# Patient Record
Sex: Female | Born: 1950 | Race: Black or African American | Hispanic: No | Marital: Single | Smoking: Never smoker
Health system: Southern US, Community
[De-identification: ages and names within clinical notes are randomized; demographics above are authoritative.]

## PROBLEM LIST (undated history)

## (undated) DIAGNOSIS — E119 Type 2 diabetes mellitus without complications: Secondary | ICD-10-CM

## (undated) DIAGNOSIS — E785 Hyperlipidemia, unspecified: Secondary | ICD-10-CM

## (undated) DIAGNOSIS — M109 Gout, unspecified: Secondary | ICD-10-CM

## (undated) DIAGNOSIS — I10 Essential (primary) hypertension: Secondary | ICD-10-CM

## (undated) DIAGNOSIS — M1990 Unspecified osteoarthritis, unspecified site: Secondary | ICD-10-CM

## (undated) DIAGNOSIS — H33313 Horseshoe tear of retina without detachment, bilateral: Secondary | ICD-10-CM

## (undated) DIAGNOSIS — G4730 Sleep apnea, unspecified: Secondary | ICD-10-CM

## (undated) DIAGNOSIS — G5600 Carpal tunnel syndrome, unspecified upper limb: Secondary | ICD-10-CM

## (undated) DIAGNOSIS — T7840XA Allergy, unspecified, initial encounter: Secondary | ICD-10-CM

## (undated) HISTORY — DX: Hyperlipidemia, unspecified: E78.5

## (undated) HISTORY — DX: Allergy, unspecified, initial encounter: T78.40XA

## (undated) HISTORY — DX: Type 2 diabetes mellitus without complications: E11.9

## (undated) HISTORY — DX: Essential (primary) hypertension: I10

## (undated) HISTORY — DX: Horseshoe tear of retina without detachment, bilateral: H33.313

## (undated) HISTORY — DX: Unspecified osteoarthritis, unspecified site: M19.90

## (undated) HISTORY — DX: Gout, unspecified: M10.9

## (undated) HISTORY — PX: COLONOSCOPY: SHX174

## (undated) HISTORY — DX: Carpal tunnel syndrome, unspecified upper limb: G56.00

## (undated) HISTORY — PX: SHOULDER SURGERY: SHX246

## (undated) HISTORY — DX: Sleep apnea, unspecified: G47.30

---

## 1969-05-01 HISTORY — PX: TONSILECTOMY/ADENOIDECTOMY WITH MYRINGOTOMY: SHX6125

## 1989-05-01 HISTORY — PX: ABDOMINAL HYSTERECTOMY: SHX81

## 1999-05-02 HISTORY — PX: ROTATOR CUFF REPAIR: SHX139

## 2001-05-01 HISTORY — PX: ROTATOR CUFF REPAIR: SHX139

## 2004-05-01 HISTORY — PX: OTHER SURGICAL HISTORY: SHX169

## 2006-04-06 ENCOUNTER — Encounter: Admission: RE | Admit: 2006-04-06 | Discharge: 2006-04-06 | Payer: Self-pay | Admitting: Internal Medicine

## 2007-04-19 ENCOUNTER — Encounter: Admission: RE | Admit: 2007-04-19 | Discharge: 2007-04-19 | Payer: Self-pay | Admitting: Internal Medicine

## 2008-06-17 ENCOUNTER — Encounter: Admission: RE | Admit: 2008-06-17 | Discharge: 2008-06-17 | Payer: Self-pay | Admitting: Internal Medicine

## 2009-06-18 ENCOUNTER — Encounter: Admission: RE | Admit: 2009-06-18 | Discharge: 2009-06-18 | Payer: Self-pay | Admitting: Obstetrics and Gynecology

## 2009-10-12 DIAGNOSIS — G5600 Carpal tunnel syndrome, unspecified upper limb: Secondary | ICD-10-CM | POA: Insufficient documentation

## 2010-05-30 ENCOUNTER — Other Ambulatory Visit: Payer: Self-pay | Admitting: Internal Medicine

## 2010-05-30 DIAGNOSIS — Z1239 Encounter for other screening for malignant neoplasm of breast: Secondary | ICD-10-CM

## 2010-06-22 ENCOUNTER — Ambulatory Visit
Admission: RE | Admit: 2010-06-22 | Discharge: 2010-06-22 | Disposition: A | Discharge status: Home / Self Care | Payer: 59 | Source: Ambulatory Visit | Attending: Internal Medicine | Admitting: Internal Medicine

## 2010-06-22 DIAGNOSIS — Z1239 Encounter for other screening for malignant neoplasm of breast: Secondary | ICD-10-CM

## 2011-06-07 ENCOUNTER — Other Ambulatory Visit: Payer: Self-pay | Admitting: Internal Medicine

## 2011-06-07 DIAGNOSIS — Z1231 Encounter for screening mammogram for malignant neoplasm of breast: Secondary | ICD-10-CM

## 2011-06-28 ENCOUNTER — Ambulatory Visit
Admission: RE | Admit: 2011-06-28 | Discharge: 2011-06-28 | Disposition: A | Discharge status: Home / Self Care | Payer: 59 | Source: Ambulatory Visit | Attending: Internal Medicine | Admitting: Internal Medicine

## 2011-06-28 DIAGNOSIS — Z1231 Encounter for screening mammogram for malignant neoplasm of breast: Secondary | ICD-10-CM

## 2011-12-07 ENCOUNTER — Ambulatory Visit (INDEPENDENT_AMBULATORY_CARE_PROVIDER_SITE_OTHER): Payer: 59 | Admitting: Ophthalmology

## 2011-12-20 ENCOUNTER — Ambulatory Visit (INDEPENDENT_AMBULATORY_CARE_PROVIDER_SITE_OTHER): Payer: 59 | Admitting: Ophthalmology

## 2011-12-20 DIAGNOSIS — H43819 Vitreous degeneration, unspecified eye: Secondary | ICD-10-CM

## 2011-12-20 DIAGNOSIS — E1139 Type 2 diabetes mellitus with other diabetic ophthalmic complication: Secondary | ICD-10-CM

## 2011-12-20 DIAGNOSIS — E11319 Type 2 diabetes mellitus with unspecified diabetic retinopathy without macular edema: Secondary | ICD-10-CM

## 2011-12-20 DIAGNOSIS — I10 Essential (primary) hypertension: Secondary | ICD-10-CM

## 2011-12-20 DIAGNOSIS — H33309 Unspecified retinal break, unspecified eye: Secondary | ICD-10-CM

## 2011-12-20 DIAGNOSIS — E1165 Type 2 diabetes mellitus with hyperglycemia: Secondary | ICD-10-CM

## 2011-12-20 DIAGNOSIS — H35039 Hypertensive retinopathy, unspecified eye: Secondary | ICD-10-CM

## 2011-12-20 DIAGNOSIS — H2510 Age-related nuclear cataract, unspecified eye: Secondary | ICD-10-CM

## 2012-04-16 ENCOUNTER — Encounter: Payer: Self-pay | Admitting: Obstetrics and Gynecology

## 2012-04-16 ENCOUNTER — Ambulatory Visit (INDEPENDENT_AMBULATORY_CARE_PROVIDER_SITE_OTHER): Payer: 59 | Admitting: Obstetrics and Gynecology

## 2012-04-16 VITALS — BP 120/90 | HR 80 | Resp 18 | Ht 66.0 in | Wt 245.0 lb

## 2012-04-16 DIAGNOSIS — Z01419 Encounter for gynecological examination (general) (routine) without abnormal findings: Secondary | ICD-10-CM

## 2012-04-16 NOTE — Progress Notes (Addendum)
Contraception Hysterectomy Last pap 05/24/2009 WNl Last Mammo 06/2011 WNL Last Colonoscopy 09/2009 WNL Last Dexa Scan 10 years ago WNL Primary MD Dorothyann Peng Abuse at Home None  No complaints  Filed Vitals:   04/16/12 1054  BP: 120/90  Pulse: 80  Resp: 18   ROS: noncontributory  Physical Examination: General appearance - alert, well appearing, and in no distress Neck - supple, no significant adenopathy Chest - clear to auscultation, no wheezes, rales or rhonchi, symmetric air entry Heart - normal rate and regular rhythm Abdomen - soft, nontender, nondistended, no masses or organomegaly Breasts - breasts appear normal, no suspicious masses, no skin or nipple changes or axillary nodes Pelvic - normal external genitalia, vulva, vagina, adnexa (still has both ovaries), nl cuff, no palpable masses Back exam - no CVAT Extremities - no edema, redness or tenderness in the calves or thighs  A/P No further paps needed - hysterectomy for benign reasons Had mammo Up to date with colonoscopy rec BDS at 61yo

## 2012-06-26 ENCOUNTER — Other Ambulatory Visit: Payer: Self-pay

## 2012-06-26 DIAGNOSIS — Z1231 Encounter for screening mammogram for malignant neoplasm of breast: Secondary | ICD-10-CM

## 2012-07-18 ENCOUNTER — Ambulatory Visit
Admission: RE | Admit: 2012-07-18 | Discharge: 2012-07-18 | Disposition: A | Discharge status: Home / Self Care | Payer: BC Managed Care – PPO | Source: Ambulatory Visit

## 2012-07-18 DIAGNOSIS — Z1231 Encounter for screening mammogram for malignant neoplasm of breast: Secondary | ICD-10-CM

## 2012-10-05 DIAGNOSIS — M109 Gout, unspecified: Secondary | ICD-10-CM | POA: Insufficient documentation

## 2012-10-05 DIAGNOSIS — M129 Arthropathy, unspecified: Secondary | ICD-10-CM | POA: Insufficient documentation

## 2012-10-05 DIAGNOSIS — R7309 Other abnormal glucose: Secondary | ICD-10-CM | POA: Insufficient documentation

## 2012-12-19 ENCOUNTER — Ambulatory Visit (INDEPENDENT_AMBULATORY_CARE_PROVIDER_SITE_OTHER): Payer: BC Managed Care – PPO | Admitting: Ophthalmology

## 2012-12-19 DIAGNOSIS — H35039 Hypertensive retinopathy, unspecified eye: Secondary | ICD-10-CM

## 2012-12-19 DIAGNOSIS — I10 Essential (primary) hypertension: Secondary | ICD-10-CM

## 2012-12-19 DIAGNOSIS — E11319 Type 2 diabetes mellitus with unspecified diabetic retinopathy without macular edema: Secondary | ICD-10-CM

## 2012-12-19 DIAGNOSIS — H348392 Tributary (branch) retinal vein occlusion, unspecified eye, stable: Secondary | ICD-10-CM

## 2012-12-19 DIAGNOSIS — H43819 Vitreous degeneration, unspecified eye: Secondary | ICD-10-CM

## 2012-12-19 DIAGNOSIS — E1165 Type 2 diabetes mellitus with hyperglycemia: Secondary | ICD-10-CM

## 2012-12-19 DIAGNOSIS — E1139 Type 2 diabetes mellitus with other diabetic ophthalmic complication: Secondary | ICD-10-CM

## 2012-12-20 ENCOUNTER — Ambulatory Visit (INDEPENDENT_AMBULATORY_CARE_PROVIDER_SITE_OTHER): Payer: 59 | Admitting: Ophthalmology

## 2013-01-03 ENCOUNTER — Encounter (INDEPENDENT_AMBULATORY_CARE_PROVIDER_SITE_OTHER): Payer: BC Managed Care – PPO | Admitting: Ophthalmology

## 2013-01-03 DIAGNOSIS — H348392 Tributary (branch) retinal vein occlusion, unspecified eye, stable: Secondary | ICD-10-CM

## 2013-05-19 ENCOUNTER — Ambulatory Visit (INDEPENDENT_AMBULATORY_CARE_PROVIDER_SITE_OTHER): Payer: BC Managed Care – PPO | Admitting: Ophthalmology

## 2013-05-29 ENCOUNTER — Ambulatory Visit (INDEPENDENT_AMBULATORY_CARE_PROVIDER_SITE_OTHER): Payer: BC Managed Care – PPO | Admitting: Ophthalmology

## 2013-05-29 DIAGNOSIS — H35039 Hypertensive retinopathy, unspecified eye: Secondary | ICD-10-CM

## 2013-05-29 DIAGNOSIS — H43819 Vitreous degeneration, unspecified eye: Secondary | ICD-10-CM

## 2013-05-29 DIAGNOSIS — I10 Essential (primary) hypertension: Secondary | ICD-10-CM

## 2013-05-29 DIAGNOSIS — E1165 Type 2 diabetes mellitus with hyperglycemia: Secondary | ICD-10-CM

## 2013-05-29 DIAGNOSIS — E1139 Type 2 diabetes mellitus with other diabetic ophthalmic complication: Secondary | ICD-10-CM

## 2013-05-29 DIAGNOSIS — E11319 Type 2 diabetes mellitus with unspecified diabetic retinopathy without macular edema: Secondary | ICD-10-CM

## 2013-05-29 DIAGNOSIS — H348392 Tributary (branch) retinal vein occlusion, unspecified eye, stable: Secondary | ICD-10-CM

## 2013-06-12 ENCOUNTER — Other Ambulatory Visit: Payer: Self-pay

## 2013-06-12 DIAGNOSIS — Z1231 Encounter for screening mammogram for malignant neoplasm of breast: Secondary | ICD-10-CM

## 2013-07-21 ENCOUNTER — Ambulatory Visit: Payer: BC Managed Care – PPO

## 2013-08-01 ENCOUNTER — Ambulatory Visit: Payer: BC Managed Care – PPO

## 2013-08-08 ENCOUNTER — Ambulatory Visit
Admission: RE | Admit: 2013-08-08 | Discharge: 2013-08-08 | Disposition: A | Discharge status: Home / Self Care | Payer: BC Managed Care – PPO | Source: Ambulatory Visit

## 2013-08-08 DIAGNOSIS — Z1231 Encounter for screening mammogram for malignant neoplasm of breast: Secondary | ICD-10-CM

## 2014-06-10 ENCOUNTER — Ambulatory Visit (INDEPENDENT_AMBULATORY_CARE_PROVIDER_SITE_OTHER): Payer: BLUE CROSS/BLUE SHIELD | Admitting: Ophthalmology

## 2014-06-10 DIAGNOSIS — H34832 Tributary (branch) retinal vein occlusion, left eye: Secondary | ICD-10-CM

## 2014-06-10 DIAGNOSIS — E11329 Type 2 diabetes mellitus with mild nonproliferative diabetic retinopathy without macular edema: Secondary | ICD-10-CM

## 2014-06-10 DIAGNOSIS — H43813 Vitreous degeneration, bilateral: Secondary | ICD-10-CM

## 2014-06-10 DIAGNOSIS — I10 Essential (primary) hypertension: Secondary | ICD-10-CM

## 2014-06-10 DIAGNOSIS — H35033 Hypertensive retinopathy, bilateral: Secondary | ICD-10-CM

## 2014-06-10 DIAGNOSIS — E11319 Type 2 diabetes mellitus with unspecified diabetic retinopathy without macular edema: Secondary | ICD-10-CM

## 2014-07-09 ENCOUNTER — Other Ambulatory Visit: Payer: Self-pay

## 2014-07-09 DIAGNOSIS — Z1231 Encounter for screening mammogram for malignant neoplasm of breast: Secondary | ICD-10-CM

## 2014-07-15 ENCOUNTER — Other Ambulatory Visit: Payer: Self-pay | Admitting: Family Medicine

## 2014-07-15 DIAGNOSIS — M25511 Pain in right shoulder: Secondary | ICD-10-CM

## 2014-07-16 ENCOUNTER — Ambulatory Visit
Admission: RE | Admit: 2014-07-16 | Discharge: 2014-07-16 | Disposition: A | Discharge status: Home / Self Care | Payer: Self-pay | Source: Ambulatory Visit | Attending: Family Medicine | Admitting: Family Medicine

## 2014-07-16 DIAGNOSIS — M25511 Pain in right shoulder: Secondary | ICD-10-CM

## 2014-08-12 ENCOUNTER — Ambulatory Visit: Payer: Self-pay

## 2014-11-03 DIAGNOSIS — S42291D Other displaced fracture of upper end of right humerus, subsequent encounter for fracture with routine healing: Secondary | ICD-10-CM | POA: Diagnosis not present

## 2014-11-03 DIAGNOSIS — M25511 Pain in right shoulder: Secondary | ICD-10-CM | POA: Diagnosis not present

## 2014-11-03 DIAGNOSIS — M25611 Stiffness of right shoulder, not elsewhere classified: Secondary | ICD-10-CM | POA: Diagnosis not present

## 2014-11-03 DIAGNOSIS — M79601 Pain in right arm: Secondary | ICD-10-CM | POA: Diagnosis not present

## 2014-11-03 DIAGNOSIS — M6281 Muscle weakness (generalized): Secondary | ICD-10-CM | POA: Diagnosis not present

## 2014-11-11 DIAGNOSIS — M75111 Incomplete rotator cuff tear or rupture of right shoulder, not specified as traumatic: Secondary | ICD-10-CM | POA: Diagnosis not present

## 2014-11-11 DIAGNOSIS — M25611 Stiffness of right shoulder, not elsewhere classified: Secondary | ICD-10-CM | POA: Diagnosis not present

## 2014-11-18 DIAGNOSIS — M25511 Pain in right shoulder: Secondary | ICD-10-CM | POA: Diagnosis not present

## 2014-11-18 DIAGNOSIS — M25611 Stiffness of right shoulder, not elsewhere classified: Secondary | ICD-10-CM | POA: Diagnosis not present

## 2014-11-18 DIAGNOSIS — S42291D Other displaced fracture of upper end of right humerus, subsequent encounter for fracture with routine healing: Secondary | ICD-10-CM | POA: Diagnosis not present

## 2014-11-18 DIAGNOSIS — M6281 Muscle weakness (generalized): Secondary | ICD-10-CM | POA: Diagnosis not present

## 2014-11-18 DIAGNOSIS — M79601 Pain in right arm: Secondary | ICD-10-CM | POA: Diagnosis not present

## 2014-11-24 DIAGNOSIS — M79601 Pain in right arm: Secondary | ICD-10-CM | POA: Diagnosis not present

## 2014-11-24 DIAGNOSIS — M25511 Pain in right shoulder: Secondary | ICD-10-CM | POA: Diagnosis not present

## 2014-11-24 DIAGNOSIS — S42291D Other displaced fracture of upper end of right humerus, subsequent encounter for fracture with routine healing: Secondary | ICD-10-CM | POA: Diagnosis not present

## 2014-11-24 DIAGNOSIS — M25611 Stiffness of right shoulder, not elsewhere classified: Secondary | ICD-10-CM | POA: Diagnosis not present

## 2014-11-24 DIAGNOSIS — M6281 Muscle weakness (generalized): Secondary | ICD-10-CM | POA: Diagnosis not present

## 2014-11-27 DIAGNOSIS — S42291D Other displaced fracture of upper end of right humerus, subsequent encounter for fracture with routine healing: Secondary | ICD-10-CM | POA: Diagnosis not present

## 2014-11-27 DIAGNOSIS — M6281 Muscle weakness (generalized): Secondary | ICD-10-CM | POA: Diagnosis not present

## 2014-11-27 DIAGNOSIS — M79601 Pain in right arm: Secondary | ICD-10-CM | POA: Diagnosis not present

## 2014-11-27 DIAGNOSIS — M25511 Pain in right shoulder: Secondary | ICD-10-CM | POA: Diagnosis not present

## 2014-11-27 DIAGNOSIS — M25611 Stiffness of right shoulder, not elsewhere classified: Secondary | ICD-10-CM | POA: Diagnosis not present

## 2014-12-01 DIAGNOSIS — M25611 Stiffness of right shoulder, not elsewhere classified: Secondary | ICD-10-CM | POA: Diagnosis not present

## 2014-12-01 DIAGNOSIS — M25511 Pain in right shoulder: Secondary | ICD-10-CM | POA: Diagnosis not present

## 2014-12-01 DIAGNOSIS — S42291D Other displaced fracture of upper end of right humerus, subsequent encounter for fracture with routine healing: Secondary | ICD-10-CM | POA: Diagnosis not present

## 2014-12-01 DIAGNOSIS — M79601 Pain in right arm: Secondary | ICD-10-CM | POA: Diagnosis not present

## 2014-12-01 DIAGNOSIS — M6281 Muscle weakness (generalized): Secondary | ICD-10-CM | POA: Diagnosis not present

## 2014-12-04 DIAGNOSIS — M79601 Pain in right arm: Secondary | ICD-10-CM | POA: Diagnosis not present

## 2014-12-04 DIAGNOSIS — M6281 Muscle weakness (generalized): Secondary | ICD-10-CM | POA: Diagnosis not present

## 2014-12-04 DIAGNOSIS — M25511 Pain in right shoulder: Secondary | ICD-10-CM | POA: Diagnosis not present

## 2014-12-04 DIAGNOSIS — M25611 Stiffness of right shoulder, not elsewhere classified: Secondary | ICD-10-CM | POA: Diagnosis not present

## 2014-12-04 DIAGNOSIS — S42291D Other displaced fracture of upper end of right humerus, subsequent encounter for fracture with routine healing: Secondary | ICD-10-CM | POA: Diagnosis not present

## 2014-12-07 DIAGNOSIS — M6281 Muscle weakness (generalized): Secondary | ICD-10-CM | POA: Diagnosis not present

## 2014-12-07 DIAGNOSIS — M25511 Pain in right shoulder: Secondary | ICD-10-CM | POA: Diagnosis not present

## 2014-12-07 DIAGNOSIS — M79601 Pain in right arm: Secondary | ICD-10-CM | POA: Diagnosis not present

## 2014-12-07 DIAGNOSIS — S42291D Other displaced fracture of upper end of right humerus, subsequent encounter for fracture with routine healing: Secondary | ICD-10-CM | POA: Diagnosis not present

## 2014-12-07 DIAGNOSIS — M25611 Stiffness of right shoulder, not elsewhere classified: Secondary | ICD-10-CM | POA: Diagnosis not present

## 2014-12-11 DIAGNOSIS — M25611 Stiffness of right shoulder, not elsewhere classified: Secondary | ICD-10-CM | POA: Diagnosis not present

## 2014-12-11 DIAGNOSIS — M6281 Muscle weakness (generalized): Secondary | ICD-10-CM | POA: Diagnosis not present

## 2014-12-11 DIAGNOSIS — M25511 Pain in right shoulder: Secondary | ICD-10-CM | POA: Diagnosis not present

## 2014-12-11 DIAGNOSIS — S42291D Other displaced fracture of upper end of right humerus, subsequent encounter for fracture with routine healing: Secondary | ICD-10-CM | POA: Diagnosis not present

## 2014-12-11 DIAGNOSIS — M79601 Pain in right arm: Secondary | ICD-10-CM | POA: Diagnosis not present

## 2014-12-15 DIAGNOSIS — R9431 Abnormal electrocardiogram [ECG] [EKG]: Secondary | ICD-10-CM | POA: Diagnosis not present

## 2014-12-15 DIAGNOSIS — Z0000 Encounter for general adult medical examination without abnormal findings: Secondary | ICD-10-CM | POA: Diagnosis not present

## 2014-12-15 DIAGNOSIS — R7309 Other abnormal glucose: Secondary | ICD-10-CM | POA: Diagnosis not present

## 2014-12-15 DIAGNOSIS — H6121 Impacted cerumen, right ear: Secondary | ICD-10-CM | POA: Diagnosis not present

## 2014-12-15 DIAGNOSIS — G4733 Obstructive sleep apnea (adult) (pediatric): Secondary | ICD-10-CM | POA: Diagnosis not present

## 2014-12-15 DIAGNOSIS — E559 Vitamin D deficiency, unspecified: Secondary | ICD-10-CM | POA: Diagnosis not present

## 2014-12-15 DIAGNOSIS — M109 Gout, unspecified: Secondary | ICD-10-CM | POA: Diagnosis not present

## 2014-12-15 DIAGNOSIS — I10 Essential (primary) hypertension: Secondary | ICD-10-CM | POA: Diagnosis not present

## 2014-12-15 DIAGNOSIS — M779 Enthesopathy, unspecified: Secondary | ICD-10-CM | POA: Diagnosis not present

## 2014-12-21 DIAGNOSIS — M25611 Stiffness of right shoulder, not elsewhere classified: Secondary | ICD-10-CM | POA: Diagnosis not present

## 2014-12-21 DIAGNOSIS — M6281 Muscle weakness (generalized): Secondary | ICD-10-CM | POA: Diagnosis not present

## 2014-12-21 DIAGNOSIS — S42291D Other displaced fracture of upper end of right humerus, subsequent encounter for fracture with routine healing: Secondary | ICD-10-CM | POA: Diagnosis not present

## 2014-12-21 DIAGNOSIS — M79601 Pain in right arm: Secondary | ICD-10-CM | POA: Diagnosis not present

## 2014-12-21 DIAGNOSIS — M25511 Pain in right shoulder: Secondary | ICD-10-CM | POA: Diagnosis not present

## 2014-12-22 DIAGNOSIS — M19071 Primary osteoarthritis, right ankle and foot: Secondary | ICD-10-CM | POA: Diagnosis not present

## 2014-12-22 DIAGNOSIS — M1A00X0 Idiopathic chronic gout, unspecified site, without tophus (tophi): Secondary | ICD-10-CM | POA: Diagnosis not present

## 2014-12-22 DIAGNOSIS — M170 Bilateral primary osteoarthritis of knee: Secondary | ICD-10-CM | POA: Diagnosis not present

## 2014-12-22 DIAGNOSIS — M19041 Primary osteoarthritis, right hand: Secondary | ICD-10-CM | POA: Diagnosis not present

## 2014-12-23 ENCOUNTER — Ambulatory Visit
Admission: RE | Admit: 2014-12-23 | Discharge: 2014-12-23 | Disposition: A | Discharge status: Home / Self Care | Payer: BLUE CROSS/BLUE SHIELD | Source: Ambulatory Visit

## 2014-12-23 DIAGNOSIS — Z1231 Encounter for screening mammogram for malignant neoplasm of breast: Secondary | ICD-10-CM | POA: Diagnosis not present

## 2014-12-24 DIAGNOSIS — S42291D Other displaced fracture of upper end of right humerus, subsequent encounter for fracture with routine healing: Secondary | ICD-10-CM | POA: Diagnosis not present

## 2014-12-24 DIAGNOSIS — M6281 Muscle weakness (generalized): Secondary | ICD-10-CM | POA: Diagnosis not present

## 2014-12-24 DIAGNOSIS — M79601 Pain in right arm: Secondary | ICD-10-CM | POA: Diagnosis not present

## 2014-12-24 DIAGNOSIS — M25611 Stiffness of right shoulder, not elsewhere classified: Secondary | ICD-10-CM | POA: Diagnosis not present

## 2014-12-24 DIAGNOSIS — M25511 Pain in right shoulder: Secondary | ICD-10-CM | POA: Diagnosis not present

## 2014-12-29 DIAGNOSIS — M19071 Primary osteoarthritis, right ankle and foot: Secondary | ICD-10-CM | POA: Diagnosis not present

## 2014-12-29 DIAGNOSIS — M779 Enthesopathy, unspecified: Secondary | ICD-10-CM | POA: Diagnosis not present

## 2015-01-07 DIAGNOSIS — H6121 Impacted cerumen, right ear: Secondary | ICD-10-CM | POA: Diagnosis not present

## 2015-01-07 DIAGNOSIS — J069 Acute upper respiratory infection, unspecified: Secondary | ICD-10-CM | POA: Diagnosis not present

## 2015-01-14 DIAGNOSIS — R0609 Other forms of dyspnea: Secondary | ICD-10-CM | POA: Diagnosis not present

## 2015-01-14 DIAGNOSIS — I10 Essential (primary) hypertension: Secondary | ICD-10-CM | POA: Diagnosis not present

## 2015-01-14 DIAGNOSIS — R600 Localized edema: Secondary | ICD-10-CM | POA: Diagnosis not present

## 2015-01-18 DIAGNOSIS — J040 Acute laryngitis: Secondary | ICD-10-CM | POA: Diagnosis not present

## 2015-01-19 DIAGNOSIS — M779 Enthesopathy, unspecified: Secondary | ICD-10-CM | POA: Diagnosis not present

## 2015-01-19 DIAGNOSIS — M2010 Hallux valgus (acquired), unspecified foot: Secondary | ICD-10-CM | POA: Diagnosis not present

## 2015-01-19 DIAGNOSIS — M19071 Primary osteoarthritis, right ankle and foot: Secondary | ICD-10-CM | POA: Diagnosis not present

## 2015-01-22 DIAGNOSIS — M79601 Pain in right arm: Secondary | ICD-10-CM | POA: Diagnosis not present

## 2015-01-22 DIAGNOSIS — M6281 Muscle weakness (generalized): Secondary | ICD-10-CM | POA: Diagnosis not present

## 2015-01-22 DIAGNOSIS — M25511 Pain in right shoulder: Secondary | ICD-10-CM | POA: Diagnosis not present

## 2015-01-22 DIAGNOSIS — S42291D Other displaced fracture of upper end of right humerus, subsequent encounter for fracture with routine healing: Secondary | ICD-10-CM | POA: Diagnosis not present

## 2015-01-22 DIAGNOSIS — M25611 Stiffness of right shoulder, not elsewhere classified: Secondary | ICD-10-CM | POA: Diagnosis not present

## 2015-02-02 DIAGNOSIS — E8770 Fluid overload, unspecified: Secondary | ICD-10-CM | POA: Diagnosis not present

## 2015-02-02 DIAGNOSIS — Z79899 Other long term (current) drug therapy: Secondary | ICD-10-CM | POA: Diagnosis not present

## 2015-02-02 DIAGNOSIS — R7309 Other abnormal glucose: Secondary | ICD-10-CM | POA: Diagnosis not present

## 2015-02-02 DIAGNOSIS — Z23 Encounter for immunization: Secondary | ICD-10-CM | POA: Diagnosis not present

## 2015-02-02 DIAGNOSIS — Z6841 Body Mass Index (BMI) 40.0 and over, adult: Secondary | ICD-10-CM | POA: Diagnosis not present

## 2015-02-02 DIAGNOSIS — H6121 Impacted cerumen, right ear: Secondary | ICD-10-CM | POA: Diagnosis not present

## 2015-03-05 DIAGNOSIS — M1711 Unilateral primary osteoarthritis, right knee: Secondary | ICD-10-CM | POA: Diagnosis not present

## 2015-03-05 DIAGNOSIS — M1712 Unilateral primary osteoarthritis, left knee: Secondary | ICD-10-CM | POA: Diagnosis not present

## 2015-04-22 DIAGNOSIS — J040 Acute laryngitis: Secondary | ICD-10-CM | POA: Diagnosis not present

## 2015-06-03 DIAGNOSIS — M71571 Other bursitis, not elsewhere classified, right ankle and foot: Secondary | ICD-10-CM | POA: Diagnosis not present

## 2015-06-03 DIAGNOSIS — M779 Enthesopathy, unspecified: Secondary | ICD-10-CM | POA: Diagnosis not present

## 2015-06-03 DIAGNOSIS — M7741 Metatarsalgia, right foot: Secondary | ICD-10-CM | POA: Diagnosis not present

## 2015-06-23 DIAGNOSIS — L821 Other seborrheic keratosis: Secondary | ICD-10-CM | POA: Diagnosis not present

## 2015-06-23 DIAGNOSIS — J321 Chronic frontal sinusitis: Secondary | ICD-10-CM | POA: Diagnosis not present

## 2015-06-23 DIAGNOSIS — R7309 Other abnormal glucose: Secondary | ICD-10-CM | POA: Diagnosis not present

## 2015-06-23 DIAGNOSIS — M109 Gout, unspecified: Secondary | ICD-10-CM | POA: Diagnosis not present

## 2015-06-23 DIAGNOSIS — I10 Essential (primary) hypertension: Secondary | ICD-10-CM | POA: Diagnosis not present

## 2015-06-28 ENCOUNTER — Ambulatory Visit (INDEPENDENT_AMBULATORY_CARE_PROVIDER_SITE_OTHER): Payer: Medicare Other | Admitting: Ophthalmology

## 2015-06-28 DIAGNOSIS — E113393 Type 2 diabetes mellitus with moderate nonproliferative diabetic retinopathy without macular edema, bilateral: Secondary | ICD-10-CM | POA: Diagnosis not present

## 2015-06-28 DIAGNOSIS — I10 Essential (primary) hypertension: Secondary | ICD-10-CM

## 2015-06-28 DIAGNOSIS — H35033 Hypertensive retinopathy, bilateral: Secondary | ICD-10-CM | POA: Diagnosis not present

## 2015-06-28 DIAGNOSIS — E11319 Type 2 diabetes mellitus with unspecified diabetic retinopathy without macular edema: Secondary | ICD-10-CM

## 2015-06-28 DIAGNOSIS — H348322 Tributary (branch) retinal vein occlusion, left eye, stable: Secondary | ICD-10-CM

## 2015-06-28 DIAGNOSIS — H43813 Vitreous degeneration, bilateral: Secondary | ICD-10-CM | POA: Diagnosis not present

## 2015-06-28 DIAGNOSIS — H2513 Age-related nuclear cataract, bilateral: Secondary | ICD-10-CM

## 2015-07-01 DIAGNOSIS — M1A00X0 Idiopathic chronic gout, unspecified site, without tophus (tophi): Secondary | ICD-10-CM | POA: Diagnosis not present

## 2015-07-01 DIAGNOSIS — M19271 Secondary osteoarthritis, right ankle and foot: Secondary | ICD-10-CM | POA: Diagnosis not present

## 2015-07-01 DIAGNOSIS — M19241 Secondary osteoarthritis, right hand: Secondary | ICD-10-CM | POA: Diagnosis not present

## 2015-07-01 DIAGNOSIS — M170 Bilateral primary osteoarthritis of knee: Secondary | ICD-10-CM | POA: Diagnosis not present

## 2015-08-30 ENCOUNTER — Encounter (INDEPENDENT_AMBULATORY_CARE_PROVIDER_SITE_OTHER): Payer: Medicare Other | Admitting: Ophthalmology

## 2015-08-31 DIAGNOSIS — M2141 Flat foot [pes planus] (acquired), right foot: Secondary | ICD-10-CM | POA: Diagnosis not present

## 2015-08-31 DIAGNOSIS — M7741 Metatarsalgia, right foot: Secondary | ICD-10-CM | POA: Insufficient documentation

## 2015-08-31 DIAGNOSIS — M2142 Flat foot [pes planus] (acquired), left foot: Secondary | ICD-10-CM | POA: Diagnosis not present

## 2015-08-31 DIAGNOSIS — M7742 Metatarsalgia, left foot: Secondary | ICD-10-CM | POA: Diagnosis not present

## 2015-10-20 ENCOUNTER — Ambulatory Visit (INDEPENDENT_AMBULATORY_CARE_PROVIDER_SITE_OTHER): Payer: Medicare Other | Admitting: Allergy and Immunology

## 2015-10-20 ENCOUNTER — Encounter: Payer: Self-pay | Admitting: Allergy and Immunology

## 2015-10-20 ENCOUNTER — Other Ambulatory Visit: Payer: Self-pay | Admitting: Allergy

## 2015-10-20 VITALS — BP 152/90 | HR 68 | Temp 98.5°F | Resp 16 | Ht 66.34 in | Wt 252.9 lb

## 2015-10-20 DIAGNOSIS — R430 Anosmia: Secondary | ICD-10-CM | POA: Diagnosis not present

## 2015-10-20 DIAGNOSIS — R062 Wheezing: Secondary | ICD-10-CM

## 2015-10-20 DIAGNOSIS — J310 Chronic rhinitis: Secondary | ICD-10-CM | POA: Insufficient documentation

## 2015-10-20 DIAGNOSIS — J4520 Mild intermittent asthma, uncomplicated: Secondary | ICD-10-CM | POA: Insufficient documentation

## 2015-10-20 MED ORDER — ALBUTEROL SULFATE HFA 108 (90 BASE) MCG/ACT IN AERS: 2.0000 | INHALATION_SPRAY | RESPIRATORY_TRACT | Status: DC | PRN

## 2015-10-20 MED ORDER — FLUTICASONE PROPIONATE 50 MCG/ACT NA SUSP: 2.0000 | Freq: Every day | NASAL | Status: DC

## 2015-10-20 NOTE — Patient Instructions (Addendum)
Chronic rhinitis Non-allergic rhinitis.  All seasonal and perennial aeroallergen skin tests are negative despite a positive histamine control.  Intranasal steroids and intranasal antihistamines are effective for symptoms associated with non-allergic rhinitis, whereas second generation antihistamines such as cetirizine, loratadine and fexofenadine have been found to be ineffective for this condition.  A prescription has been provided for fluticasone nasal spray, 2 sprays per nostril daily. Proper nasal spray technique has been discussed and demonstrated.  I have also recommended nasal saline lavage (i.e., NeilMed) as needed prior to medicated nasal sprays.  Guaifenesin 1200 mg twice daily as needed with adequate hydration as discussed.   Anosmia  A prescription has been provided for fluticasone nasal spray (as above).  Robin Odom plans to see an otolaryngologist in the near future to rule out nasal polyps.  Intermittent coughing/wheezing  A prescription has been provided for albuterol HFA, 1-2 inhalations every 4-6 hours as needed.  Subjective and objective measures of pulmonary function will be followed and the treatment plan will be adjusted accordingly.    Return in about 4 months (around 02/19/2016), or if symptoms worsen or fail to improve.

## 2015-10-20 NOTE — Assessment & Plan Note (Addendum)
Non-allergic rhinitis.  All seasonal and perennial aeroallergen skin tests are negative despite a positive histamine control.  Intranasal steroids and intranasal antihistamines are effective for symptoms associated with non-allergic rhinitis, whereas second generation antihistamines such as cetirizine, loratadine and fexofenadine have been found to be ineffective for this condition.  A prescription has been provided for fluticasone nasal spray, 2 sprays per nostril daily. Proper nasal spray technique has been discussed and demonstrated.  I have also recommended nasal saline lavage (i.e., NeilMed) as needed prior to medicated nasal sprays.  Guaifenesin 1200 mg twice daily as needed with adequate hydration as discussed.

## 2015-10-20 NOTE — Progress Notes (Signed)
New Patient Note  RE: Robin Odom MRN: [id number] DOB: 1951-03-12 Date of Office Visit: 10/20/2015  Referring provider: Glendale Chard, MD Primary care provider: Maximino Greenland, MD  Chief Complaint: Allergies and Nasal Congestion   History of present illness: HPI Comments: Robin Odom is a 65 y.o. female presenting today for consultation of rhinitis.  She reports that starting in August 2016 she began experiencing persistent nasal congestion, "relentless" thick postnasal drainage, throat clearing, hoarseness, and sneezing "like crazy."  In addition, she reports anosmia since late February 2017.  She experiences coughing and mild wheezing with upper respiratory tract infections.  She denies lower respiratory symptoms at rest or with exercise.   Assessment and plan: Chronic rhinitis Non-allergic rhinitis.  All seasonal and perennial aeroallergen skin tests are negative despite a positive histamine control.  Intranasal steroids and intranasal antihistamines are effective for symptoms associated with non-allergic rhinitis, whereas second generation antihistamines such as cetirizine, loratadine and fexofenadine have been found to be ineffective for this condition.  A prescription has been provided for fluticasone nasal spray, 2 sprays per nostril daily. Proper nasal spray technique has been discussed and demonstrated.  I have also recommended nasal saline lavage (i.e., NeilMed) as needed prior to medicated nasal sprays.  Guaifenesin 1200 mg twice daily as needed with adequate hydration as discussed.   Anosmia  A prescription has been provided for fluticasone nasal spray (as above).  Tria plans to see an otolaryngologist in the near future to rule out nasal polyps.  Intermittent coughing/wheezing  A prescription has been provided for albuterol HFA, 1-2 inhalations every 4-6 hours as needed.  Subjective and objective measures of pulmonary function will be followed and the treatment plan  will be adjusted accordingly.    Meds ordered this encounter  Medications  . albuterol (PROAIR HFA) 108 (90 Base) MCG/ACT inhaler    Sig: Inhale 2 puffs into the lungs every 4 (four) hours as needed for wheezing or shortness of breath.    Dispense:  1 Inhaler    Refill:  1    USE WITH SPACER  . fluticasone (FLONASE) 50 MCG/ACT nasal spray    Sig: Place 2 sprays into both nostrils daily.    Dispense:  16 g    Refill:  5    FOR STUFFY NOSE OR DRAINAGE.    Diagnositics: Spirometry: FVC was 2.02 L and FEV1 was 1.72 L (80% predicted) with 170 mL postbronchodilator improvement.  Please see scanned spirometry results for details. Epicutaneous testing: Negative despite a positive histamine control. Intradermal testing: Negative despite a positive histamine control.    Physical examination: Blood pressure 152/90, pulse 68, temperature 98.5 F (36.9 C), temperature source Oral, resp. rate 16, height 5' 6.34" (1.685 m), weight 252 lb 13.9 oz (114.7 kg), SpO2 98 %.  General: Alert, interactive, in no acute distress. HEENT: TMs pearly gray, turbinates edematous with thick discharge, post-pharynx moderately erythematous.  No nasal polyps visualized. Neck: Supple without lymphadenopathy. Lungs: Clear to auscultation without wheezing, rhonchi or rales. CV: Normal S1, S2 without murmurs. Abdomen: Nondistended, nontender. Skin: Warm and dry, without lesions or rashes. Extremities:  No clubbing, cyanosis or edema. Neuro:   Grossly intact.  Review of systems:  Review of Systems  Constitutional: Negative for fever, chills and weight loss.  HENT: Positive for congestion. Negative for nosebleeds.   Eyes: Negative for blurred vision.  Respiratory: Positive for cough and wheezing. Negative for hemoptysis.   Cardiovascular: Negative for chest pain.  Gastrointestinal: Negative for diarrhea and  constipation.  Genitourinary: Negative for dysuria.  Musculoskeletal: Negative for myalgias and joint  pain.  Neurological: Negative for dizziness.  Endo/Heme/Allergies: Positive for environmental allergies. Does not bruise/bleed easily.    Past medical history:  Past Medical History  Diagnosis Date  . Hypertension   . Diabetes mellitus without complication ([place])   . Arthritis   . Gout     Past surgical history:  Past Surgical History  Procedure Laterality Date  . Abdominal hysterectomy    . Tonsilectomy/adenoidectomy with myringotomy  1971  . Shoulder surgery  2001/2003  . Left knee surgery  2006    Family history: Family History  Problem Relation Age of Onset  . Heart disease Mother   . Arthritis Father   . Hypertension Father   . Prostate cancer Father   . Sinusitis Sister   . Allergic rhinitis Sister   . Angioedema Neg Hx   . Asthma Neg Hx   . Eczema Neg Hx   . Immunodeficiency Neg Hx   . Urticaria Neg Hx     Social history: Social History   Social History  . Marital Status: Unknown    Spouse Name: N/A  . Number of Children: N/A  . Years of Education: N/A   Occupational History  . Not on file.   Social History Main Topics  . Smoking status: Never Smoker   . Smokeless tobacco: Not on file  . Alcohol Use: Yes  . Drug Use: No  . Sexual Activity: Not Currently    Birth Control/ Protection: None     Comment: Hysterectomy   Other Topics Concern  . Not on file   Social History Narrative   Environmental History: The patient lives in a 66 year old house with carpeting throughout, gas heat, and central air.  She is a nonsmoker without pets.    Medication List       This list is accurate as of: 10/20/15  6:52 PM.  Always use your most recent med list.               albuterol 108 (90 Base) MCG/ACT inhaler  Commonly known as:  PROAIR HFA  Inhale 2 puffs into the lungs every 4 (four) hours as needed for wheezing or shortness of breath.     albuterol 108 (90 Base) MCG/ACT inhaler  Commonly known as:  PROVENTIL HFA  Inhale 2 puffs into the lungs  every 4 (four) hours as needed for wheezing or shortness of breath.     allopurinol 300 MG tablet  Commonly known as:  ZYLOPRIM  Take 300 mg by mouth.     AZOR 5-40 MG tablet  Generic drug:  amLODipine-olmesartan  Take 1 tablet by mouth daily.     colchicine 0.6 MG tablet  Take 0.6 mg by mouth as needed.     fluticasone 50 MCG/ACT nasal spray  Commonly known as:  FLONASE  Place 2 sprays into both nostrils daily.     furosemide 40 MG tablet  Commonly known as:  LASIX  Take 40 mg by mouth daily.     Glucosamine-Chondroitin 1500-1200 MG/30ML Liqd  Take 2,000 mg by mouth daily. 2 tablespoons daily.     JANUVIA 100 MG tablet  Generic drug:  sitaGLIPtin  Take 100 mg by mouth daily.     KLOR-CON M10 10 MEQ tablet  Generic drug:  potassium chloride     LOVAZA 1 g capsule  Generic drug:  omega-3 acid ethyl esters  Take 2 g by mouth 2 (  two) times daily. Reported on 10/20/2015     Melatonin 3 MG Tabs  Take 3 mg by mouth as needed.     meloxicam 15 MG tablet  Commonly known as:  MOBIC  Take 15 mg by mouth as needed.     metoprolol succinate 50 MG 24 hr tablet  Commonly known as:  TOPROL-XL  Take 50 mg by mouth daily. Take with or immediately following a meal.     MULTIVITAMIN+ Liqd  Take by mouth.     potassium chloride 20 MEQ packet  Commonly known as:  KLOR-CON  Take 20 mEq by mouth 1 day or 1 dose. Reported on 10/20/2015     potassium chloride 10 MEQ tablet  Commonly known as:  K-DUR  Take 10 mEq by mouth 1 day or 1 dose.     sitaGLIPtin-metformin 50-1000 MG tablet  Commonly known as:  JANUMET  Take 1 tablet by mouth 2 (two) times daily with a meal. Reported on 10/20/2015     sitaGLIPtin-metformin 50-500 MG tablet  Commonly known as:  JANUMET  Take 1 tablet by mouth 2 (two) times daily with a meal. Reported on 10/20/2015     Vitamin D3 3000 units Tabs  Take by mouth.     Vitamin-B Complex Tabs  Take by mouth.        Known medication allergies: Allergies    Allergen Reactions  . Codeine Other (See Comments)    Hallucinations  . Penicillins Hives    I appreciate the opportunity to take part in Laryah's care. Please do not hesitate to contact me with questions.  Sincerely,   R. Edgar Frisk, MD

## 2015-10-20 NOTE — Assessment & Plan Note (Signed)
   A prescription has been provided for albuterol HFA, 1-2 inhalations every 4-6 hours as needed.  Subjective and objective measures of pulmonary function will be followed and the treatment plan will be adjusted accordingly. 

## 2015-10-20 NOTE — Assessment & Plan Note (Signed)
   A prescription has been provided for fluticasone nasal spray (as above).  Robin Odom plans to see an otolaryngologist in the near future to rule out nasal polyps.

## 2015-10-27 DIAGNOSIS — Z79899 Other long term (current) drug therapy: Secondary | ICD-10-CM | POA: Diagnosis not present

## 2015-10-27 DIAGNOSIS — R7309 Other abnormal glucose: Secondary | ICD-10-CM | POA: Diagnosis not present

## 2015-10-27 DIAGNOSIS — I10 Essential (primary) hypertension: Secondary | ICD-10-CM | POA: Diagnosis not present

## 2015-10-27 DIAGNOSIS — J040 Acute laryngitis: Secondary | ICD-10-CM | POA: Diagnosis not present

## 2015-11-16 ENCOUNTER — Other Ambulatory Visit: Payer: Self-pay | Admitting: Internal Medicine

## 2015-11-16 DIAGNOSIS — Z1231 Encounter for screening mammogram for malignant neoplasm of breast: Secondary | ICD-10-CM

## 2015-12-08 DIAGNOSIS — J370 Chronic laryngitis: Secondary | ICD-10-CM | POA: Diagnosis not present

## 2015-12-08 DIAGNOSIS — K219 Gastro-esophageal reflux disease without esophagitis: Secondary | ICD-10-CM | POA: Insufficient documentation

## 2015-12-08 DIAGNOSIS — R430 Anosmia: Secondary | ICD-10-CM | POA: Diagnosis not present

## 2015-12-24 ENCOUNTER — Ambulatory Visit
Admission: RE | Admit: 2015-12-24 | Discharge: 2015-12-24 | Disposition: A | Discharge status: Home / Self Care | Payer: Medicare Other | Source: Ambulatory Visit | Attending: Internal Medicine | Admitting: Internal Medicine

## 2015-12-24 DIAGNOSIS — Z1231 Encounter for screening mammogram for malignant neoplasm of breast: Secondary | ICD-10-CM | POA: Diagnosis not present

## 2015-12-24 DIAGNOSIS — Z79899 Other long term (current) drug therapy: Secondary | ICD-10-CM | POA: Diagnosis not present

## 2015-12-24 DIAGNOSIS — R5381 Other malaise: Secondary | ICD-10-CM | POA: Diagnosis not present

## 2015-12-30 DIAGNOSIS — M25561 Pain in right knee: Secondary | ICD-10-CM | POA: Diagnosis not present

## 2015-12-30 DIAGNOSIS — M1711 Unilateral primary osteoarthritis, right knee: Secondary | ICD-10-CM | POA: Diagnosis not present

## 2016-01-11 DIAGNOSIS — E559 Vitamin D deficiency, unspecified: Secondary | ICD-10-CM | POA: Diagnosis not present

## 2016-01-11 DIAGNOSIS — M2550 Pain in unspecified joint: Secondary | ICD-10-CM | POA: Diagnosis not present

## 2016-01-11 DIAGNOSIS — Z0001 Encounter for general adult medical examination with abnormal findings: Secondary | ICD-10-CM | POA: Diagnosis not present

## 2016-01-11 DIAGNOSIS — R7309 Other abnormal glucose: Secondary | ICD-10-CM | POA: Diagnosis not present

## 2016-01-11 DIAGNOSIS — I10 Essential (primary) hypertension: Secondary | ICD-10-CM | POA: Diagnosis not present

## 2016-01-17 DIAGNOSIS — M19241 Secondary osteoarthritis, right hand: Secondary | ICD-10-CM | POA: Diagnosis not present

## 2016-01-17 DIAGNOSIS — M174 Other bilateral secondary osteoarthritis of knee: Secondary | ICD-10-CM | POA: Diagnosis not present

## 2016-01-17 DIAGNOSIS — M5030 Other cervical disc degeneration, unspecified cervical region: Secondary | ICD-10-CM | POA: Diagnosis not present

## 2016-01-17 DIAGNOSIS — M19271 Secondary osteoarthritis, right ankle and foot: Secondary | ICD-10-CM | POA: Diagnosis not present

## 2016-02-12 DIAGNOSIS — Z23 Encounter for immunization: Secondary | ICD-10-CM | POA: Diagnosis not present

## 2016-04-07 DIAGNOSIS — Z8739 Personal history of other diseases of the musculoskeletal system and connective tissue: Secondary | ICD-10-CM | POA: Diagnosis not present

## 2016-04-07 DIAGNOSIS — Z01419 Encounter for gynecological examination (general) (routine) without abnormal findings: Secondary | ICD-10-CM | POA: Diagnosis not present

## 2016-04-07 DIAGNOSIS — Z6841 Body Mass Index (BMI) 40.0 and over, adult: Secondary | ICD-10-CM | POA: Diagnosis not present

## 2016-04-19 DIAGNOSIS — Z6841 Body Mass Index (BMI) 40.0 and over, adult: Secondary | ICD-10-CM | POA: Diagnosis not present

## 2016-04-19 DIAGNOSIS — R7303 Prediabetes: Secondary | ICD-10-CM | POA: Diagnosis not present

## 2016-04-19 DIAGNOSIS — M2550 Pain in unspecified joint: Secondary | ICD-10-CM | POA: Diagnosis not present

## 2016-06-30 ENCOUNTER — Ambulatory Visit (INDEPENDENT_AMBULATORY_CARE_PROVIDER_SITE_OTHER): Payer: Medicare Other | Admitting: Ophthalmology

## 2016-06-30 DIAGNOSIS — E11311 Type 2 diabetes mellitus with unspecified diabetic retinopathy with macular edema: Secondary | ICD-10-CM

## 2016-06-30 DIAGNOSIS — H43813 Vitreous degeneration, bilateral: Secondary | ICD-10-CM

## 2016-06-30 DIAGNOSIS — H35033 Hypertensive retinopathy, bilateral: Secondary | ICD-10-CM | POA: Diagnosis not present

## 2016-06-30 DIAGNOSIS — I10 Essential (primary) hypertension: Secondary | ICD-10-CM

## 2016-06-30 DIAGNOSIS — E113312 Type 2 diabetes mellitus with moderate nonproliferative diabetic retinopathy with macular edema, left eye: Secondary | ICD-10-CM | POA: Diagnosis not present

## 2016-07-07 ENCOUNTER — Other Ambulatory Visit: Payer: Self-pay | Admitting: Rheumatology

## 2016-07-20 DIAGNOSIS — M19041 Primary osteoarthritis, right hand: Secondary | ICD-10-CM | POA: Insufficient documentation

## 2016-07-20 DIAGNOSIS — M19042 Primary osteoarthritis, left hand: Secondary | ICD-10-CM | POA: Insufficient documentation

## 2016-07-20 DIAGNOSIS — Z8781 Personal history of (healed) traumatic fracture: Secondary | ICD-10-CM | POA: Insufficient documentation

## 2016-07-20 DIAGNOSIS — M1A09X0 Idiopathic chronic gout, multiple sites, without tophus (tophi): Secondary | ICD-10-CM | POA: Insufficient documentation

## 2016-07-20 DIAGNOSIS — Z8639 Personal history of other endocrine, nutritional and metabolic disease: Secondary | ICD-10-CM | POA: Insufficient documentation

## 2016-07-20 DIAGNOSIS — M19071 Primary osteoarthritis, right ankle and foot: Secondary | ICD-10-CM | POA: Insufficient documentation

## 2016-07-20 DIAGNOSIS — Z8679 Personal history of other diseases of the circulatory system: Secondary | ICD-10-CM | POA: Insufficient documentation

## 2016-07-20 DIAGNOSIS — R748 Abnormal levels of other serum enzymes: Secondary | ICD-10-CM | POA: Insufficient documentation

## 2016-07-20 DIAGNOSIS — M170 Bilateral primary osteoarthritis of knee: Secondary | ICD-10-CM | POA: Insufficient documentation

## 2016-07-20 DIAGNOSIS — M47812 Spondylosis without myelopathy or radiculopathy, cervical region: Secondary | ICD-10-CM | POA: Insufficient documentation

## 2016-07-20 DIAGNOSIS — M19072 Primary osteoarthritis, left ankle and foot: Secondary | ICD-10-CM | POA: Insufficient documentation

## 2016-07-20 NOTE — Progress Notes (Deleted)
Office Visit Note  Patient: Robin Odom             Date of Birth: Aug 18, 1950           MRN: [id number]             PCP: Maximino Greenland, MD Referring: Glendale Chard, MD Visit Date: 07/26/2016 Occupation: @GUAROCC @    Subjective:  No chief complaint on file.   History of Present Illness: Robin Odom is a 66 y.o. female ***   Activities of Daily Living:  Patient reports morning stiffness for *** {minute/hour:19697}.   Patient {ACTIONS;DENIES/REPORTS:21021675::"Denies"} nocturnal pain.  Difficulty dressing/grooming: {ACTIONS;DENIES/REPORTS:21021675::"Denies"} Difficulty climbing stairs: {ACTIONS;DENIES/REPORTS:21021675::"Denies"} Difficulty getting out of chair: {ACTIONS;DENIES/REPORTS:21021675::"Denies"} Difficulty using hands for taps, buttons, cutlery, and/or writing: {ACTIONS;DENIES/REPORTS:21021675::"Denies"}   No Rheumatology ROS completed.   PMFS History:  Patient Active Problem List   Diagnosis Date Noted  . 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 tophus 07/20/2016  . Primary osteoarthritis of both hands 07/20/2016  . Primary osteoarthritis of both knees 07/20/2016  . Primary osteoarthritis of both feet 07/20/2016  . DJD (degenerative joint disease), cervical 07/20/2016  . Elevated CK 07/20/2016  . History of humerus fracture 07/20/2016  . History of hypertension 07/20/2016  . History of diabetes mellitus 07/20/2016  . History of hyperlipidemia 07/20/2016  . Chronic rhinitis 10/20/2015  . Anosmia 10/20/2015  . Intermittent coughing/wheezing 10/20/2015    Past Medical History:  Diagnosis Date  . Arthritis   . Diabetes mellitus without complication ([place])   . Gout   . Hypertension     Family History  Problem Relation Age of Onset  . Heart disease Mother   . Arthritis Father   . Hypertension Father   . Prostate cancer Father   . Sinusitis Sister   . Allergic rhinitis Sister   . Angioedema Neg Hx   . Asthma Neg Hx   . Eczema Neg Hx   .  Immunodeficiency Neg Hx   . Urticaria Neg Hx    Past Surgical History:  Procedure Laterality Date  . ABDOMINAL HYSTERECTOMY    . Left knee surgery  2006  . SHOULDER SURGERY  2001/2003  . TONSILECTOMY/ADENOIDECTOMY WITH MYRINGOTOMY  1971   Social History   Social History Narrative  . No narrative on file     Objective: Vital Signs: There were no vitals taken for this visit.   Physical Exam   Musculoskeletal Exam: ***  CDAI Exam: No CDAI exam completed.    Investigation: Findings:  December 24, 2015:  CBC was normal.  Comprehensive metabolic panel was normal and uric acid was 5.6.     Imaging: No results found.  Speciality Comments: No specialty comments available.    Procedures:  No procedures performed Allergies: Codeine and Penicillins   Assessment / Plan:     Visit Diagnoses: 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 tophus  Primary osteoarthritis of both hands  Primary osteoarthritis of both knees  Primary osteoarthritis of both feet  DJD (degenerative joint disease), cervical  Elevated CK  History of humerus fracture - right 2016  History of hypertension  History of diabetes mellitus  History of hyperlipidemia    Orders: No orders of the defined types were placed in this encounter.  No orders of the defined types were placed in this encounter.   Face-to-face time spent with patient was *** minutes. 50% of time was spent in counseling and coordination of care.  Follow-Up Instructions: No Follow-up on file.   Robin Odom,  RT  Note - This record has been created using [employer].  Chart creation errors have been sought, but may not always  have been located. Such creation errors do not reflect on  the standard of medical care.

## 2016-07-26 ENCOUNTER — Ambulatory Visit: Payer: Medicare Other | Admitting: Rheumatology

## 2016-08-08 NOTE — Progress Notes (Signed)
Office Visit Note  Patient: Robin Odom             Date of Birth: 01-07-1951           MRN: [id number]             PCP: Maximino Greenland, MD Referring: Glendale Chard, MD Visit Date: 08/21/2016 Occupation: @GUAROCC @    Subjective:  Left knee pain  History of Present Illness: Robin Odom is a 66 y.o. female with a history of gout and osteoarthritis who presents to the clinic for a follow-up visit.  Patient states she is currently having left knee pain, which she attributes to her OA and weather changes.  Patient denies any joint swelling or overlying skin changes at this time.  Patient states she takes omega 3 and tumeric daily, which have significantly improved her pain.  Patient denies any hand, feet, ankle, or elbow pain.  She states she has recently started using cabbage wraps around her knees, which she states have provided pain relief within 30 minutes of using.      Patient states she has not had a gout flare in 2-3 years.  She states she avoids her triggers.  She states she takes her allopurinol 300 mg daily and has colchicine 0.6 mg in case of flares.  Patient is requesting a refill of her colchcine.    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  Patient states she occasionally has hand numbness after overuse.  She states previously has taken Vitamin B6, which had improved her symptoms.  She states she is going to go back on B6, which prevents and relieves her numbness.        Activities of Daily Living:  Patient reports morning stiffness 1  minute.  Stiffness worse at night. Patient Denies nocturnal pain.  Difficulty dressing/grooming: Denies Difficulty climbing stairs: Reports Difficulty getting out of chair: Reports Difficulty using hands for taps, buttons, cutlery, and/or writing: Denies   Review of Systems  Constitutional: Negative for fatigue.  HENT: Negative for mouth sores and mouth dryness.   Eyes: Negative for dryness.  Respiratory:  Negative for shortness of breath and difficulty breathing.   Cardiovascular: Negative for chest pain and palpitations.  Gastrointestinal: Negative for constipation, diarrhea, nausea and vomiting.  Musculoskeletal: Positive for arthralgias and joint pain. Negative for joint swelling and morning stiffness.  Skin: Negative for color change and rash.  Psychiatric/Behavioral: Negative for sleep disturbance.    PMFS History:  Patient Active Problem List   Diagnosis Date Noted  . 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 tophus 07/20/2016  . Primary osteoarthritis of both hands 07/20/2016  . Primary osteoarthritis of both knees 07/20/2016  . Primary osteoarthritis of both feet 07/20/2016  . DJD (degenerative joint disease), cervical 07/20/2016  . Elevated CK 07/20/2016  . History of humerus fracture 07/20/2016  . History of hypertension 07/20/2016  . History of diabetes mellitus 07/20/2016  . History of hyperlipidemia 07/20/2016  . Chronic rhinitis 10/20/2015  . Anosmia 10/20/2015  . Intermittent coughing/wheezing 10/20/2015    Past Medical History:  Diagnosis Date  . Arthritis   . Diabetes mellitus without complication ([place])   . Gout   . Hypertension     Family History  Problem Relation Age of Onset  . Heart disease Mother   . Arthritis Father   . Hypertension Father   . Prostate cancer Father   . Parkinson's disease Father   . Sinusitis Sister   .  Allergic rhinitis Sister   . Angioedema Neg Hx   . Asthma Neg Hx   . Eczema Neg Hx   . Immunodeficiency Neg Hx   . Urticaria Neg Hx    Past Surgical History:  Procedure Laterality Date  . ABDOMINAL HYSTERECTOMY    . Left knee surgery  2006  . SHOULDER SURGERY  2001/2003  . TONSILECTOMY/ADENOIDECTOMY WITH MYRINGOTOMY  1971   Social History   Social History Narrative  . No narrative on file     Objective: Vital Signs: BP (!) 163/81 (BP Location: Left Arm, Patient Position: Sitting, Cuff Size: Normal)   Pulse  72   Resp 17   Ht 5' 6.5" (1.689 m)   Wt 251 lb (113.9 kg)   BMI 39.91 kg/m    Physical Exam  Constitutional: She is oriented to person, place, and time. She appears well-developed and well-nourished.  HENT:  Head: Normocephalic and atraumatic.  Eyes: Pupils are equal, round, and reactive to light.  Cardiovascular: Normal rate and regular rhythm.   Pulmonary/Chest: Effort normal and breath sounds normal.  Abdominal: Soft.  Neurological: She is alert and oriented to person, place, and time.  Skin: Skin is warm and dry.  Psychiatric: She has a normal mood and affect.  Nursing note and vitals reviewed.    Musculoskeletal Exam: C-spine and thoracic spine exhibit full ROM.  Slightly limited flexion of right shoulder. Full ROM of elbows, wrists, MCPs, PIPs, DIPs, hips, knees, and ankles. She has mild thickening of PIP joints. No warmth swelling or effusion in the knee joints was noted.  No signs of active synovitis.  No tenderness to palpation on exam.        CDAI Exam:  No CDAI exam completed.    Investigation: Findings:  December 24, 2015:  CBC was normal.  Comprehensive metabolic panel was normal and uric acid was 5.6.     Imaging: No results found.  Speciality Comments: No specialty comments available.    Procedures:  No procedures performed Allergies: Codeine and Penicillins   Assessment / Plan:     Visit Diagnoses: 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 tophus: She has not had a flare in a while. She continues do is to do well on allopurinol. She would like to have a prescription for colchicine to be available on hold. I'll refill that today.  DJD (degenerative joint disease), cervical: Minimal discomfort  Primary osteoarthritis of both hands: She has mild osteoarthritic changes  Primary osteoarthritis of both knees: She continues to have chronic pain in her knee joints due to underlying osteoarthritis she is tolerating well.  Primary osteoarthritis of  both feet: Proper fitting shoes were discussed.  History of humerus fracture  History of hypertension: Blood pressure is elevated today. I've advised to monitor blood pressure closely and follow up with her PCP.  History of hyperlipidemia  History of diabetes mellitus    Orders: Orders Placed This Encounter  Procedures  . CBC with Differential/Platelet  . COMPLETE METABOLIC PANEL WITH GFR  . Uric acid   Meds ordered this encounter  Medications  . colchicine 0.6 MG tablet    Sig: Take 1 tablet (0.6 mg total) by mouth as needed.    Dispense:  30 tablet    Refill:  0    Face-to-face time spent with patient was 30 minutes. 50% of time was spent in counseling and coordination of care.  Follow-Up Instructions: Return in about 6 months (around 02/20/2017) for Osteoarthritis, Gout.   Abel Presto  Estanislado Pandy, MD  Note - This record has been created using [employer].  Chart creation errors have been sought, but may not always  have been located. Such creation errors do not reflect on  the standard of medical care.

## 2016-08-21 ENCOUNTER — Encounter: Payer: Self-pay | Admitting: Rheumatology

## 2016-08-21 ENCOUNTER — Ambulatory Visit (INDEPENDENT_AMBULATORY_CARE_PROVIDER_SITE_OTHER): Payer: Medicare Other | Admitting: Rheumatology

## 2016-08-21 VITALS — BP 163/81 | HR 72 | Resp 17 | Ht 66.5 in | Wt 251.0 lb

## 2016-08-21 DIAGNOSIS — M170 Bilateral primary osteoarthritis of knee: Secondary | ICD-10-CM | POA: Diagnosis not present

## 2016-08-21 DIAGNOSIS — Z5181 Encounter for therapeutic drug level monitoring: Secondary | ICD-10-CM | POA: Diagnosis not present

## 2016-08-21 DIAGNOSIS — M19071 Primary osteoarthritis, right ankle and foot: Secondary | ICD-10-CM

## 2016-08-21 DIAGNOSIS — Z8781 Personal history of (healed) traumatic fracture: Secondary | ICD-10-CM

## 2016-08-21 DIAGNOSIS — M19042 Primary osteoarthritis, left hand: Secondary | ICD-10-CM | POA: Diagnosis not present

## 2016-08-21 DIAGNOSIS — M1A09X0 Idiopathic chronic gout, multiple sites, without tophus (tophi): Secondary | ICD-10-CM | POA: Diagnosis not present

## 2016-08-21 DIAGNOSIS — M5030 Other cervical disc degeneration, unspecified cervical region: Secondary | ICD-10-CM | POA: Diagnosis not present

## 2016-08-21 DIAGNOSIS — Z8679 Personal history of other diseases of the circulatory system: Secondary | ICD-10-CM | POA: Diagnosis not present

## 2016-08-21 DIAGNOSIS — R748 Abnormal levels of other serum enzymes: Secondary | ICD-10-CM

## 2016-08-21 DIAGNOSIS — Z8639 Personal history of other endocrine, nutritional and metabolic disease: Secondary | ICD-10-CM

## 2016-08-21 DIAGNOSIS — M19072 Primary osteoarthritis, left ankle and foot: Secondary | ICD-10-CM

## 2016-08-21 DIAGNOSIS — M47812 Spondylosis without myelopathy or radiculopathy, cervical region: Secondary | ICD-10-CM

## 2016-08-21 DIAGNOSIS — M19041 Primary osteoarthritis, right hand: Secondary | ICD-10-CM

## 2016-08-21 LAB — CBC WITH DIFFERENTIAL/PLATELET
Basophils Absolute: 0 cells/uL (ref 0–200)
Basophils Relative: 0 %
Eosinophils Absolute: 218 cells/uL (ref 15–500)
Eosinophils Relative: 2 %
HCT: 40.5 % (ref 35.0–45.0)
Hemoglobin: 12.8 g/dL (ref 11.7–15.5)
Lymphocytes Relative: 28 %
Lymphs Abs: 3052 cells/uL (ref 850–3900)
MCH: 25.4 pg — ABNORMAL LOW (ref 27.0–33.0)
MCHC: 31.6 g/dL — ABNORMAL LOW (ref 32.0–36.0)
MCV: 80.4 fL (ref 80.0–100.0)
MPV: 9.9 fL (ref 7.5–12.5)
Monocytes Absolute: 654 cells/uL (ref 200–950)
Monocytes Relative: 6 %
Neutro Abs: 6976 cells/uL (ref 1500–7800)
Neutrophils Relative %: 64 %
Platelets: 343 10*3/uL (ref 140–400)
RBC: 5.04 MIL/uL (ref 3.80–5.10)
RDW: 16.9 % — ABNORMAL HIGH (ref 11.0–15.0)
WBC: 10.9 10*3/uL — ABNORMAL HIGH (ref 3.8–10.8)

## 2016-08-21 LAB — COMPLETE METABOLIC PANEL WITH GFR
ALT: 17 U/L (ref 6–29)
AST: 23 U/L (ref 10–35)
Albumin: 4.1 g/dL (ref 3.6–5.1)
Alkaline Phosphatase: 59 U/L (ref 33–130)
BUN: 15 mg/dL (ref 7–25)
CO2: 27 mmol/L (ref 20–31)
Calcium: 9.7 mg/dL (ref 8.6–10.4)
Chloride: 107 mmol/L (ref 98–110)
Creat: 0.82 mg/dL (ref 0.50–0.99)
GFR, Est African American: 86 mL/min (ref >=60)
GFR, Est Non African American: 75 mL/min (ref >=60)
Glucose, Bld: 98 mg/dL (ref 65–99)
Potassium: 4.2 mmol/L (ref 3.5–5.3)
Sodium: 143 mmol/L (ref 135–146)
Total Bilirubin: 0.4 mg/dL (ref 0.2–1.2)
Total Protein: 7.1 g/dL (ref 6.1–8.1)

## 2016-08-21 MED ORDER — COLCHICINE 0.6 MG PO TABS: 0.6000 mg | ORAL_TABLET | ORAL | 0 refills | Status: DC | PRN

## 2016-08-22 LAB — URIC ACID: Uric Acid, Serum: 4.5 mg/dL (ref 2.5–7.0)

## 2016-08-22 NOTE — Progress Notes (Signed)
Labs stable

## 2016-09-10 ENCOUNTER — Other Ambulatory Visit: Payer: Self-pay | Admitting: Rheumatology

## 2016-09-11 NOTE — Telephone Encounter (Signed)
ok 

## 2016-09-11 NOTE — Telephone Encounter (Signed)
Last Visit: 08/21/16 Next Visit: 02/20/17 Labs: 08/21/16 Stable  Okay to refill Allopurinol?

## 2016-10-09 DIAGNOSIS — R202 Paresthesia of skin: Secondary | ICD-10-CM | POA: Diagnosis not present

## 2016-10-09 DIAGNOSIS — I10 Essential (primary) hypertension: Secondary | ICD-10-CM | POA: Diagnosis not present

## 2016-10-09 DIAGNOSIS — E2839 Other primary ovarian failure: Secondary | ICD-10-CM | POA: Diagnosis not present

## 2016-10-09 DIAGNOSIS — R7303 Prediabetes: Secondary | ICD-10-CM | POA: Diagnosis not present

## 2016-10-09 DIAGNOSIS — Z23 Encounter for immunization: Secondary | ICD-10-CM | POA: Diagnosis not present

## 2016-10-17 ENCOUNTER — Other Ambulatory Visit: Payer: Self-pay | Admitting: Internal Medicine

## 2016-10-17 DIAGNOSIS — E2839 Other primary ovarian failure: Secondary | ICD-10-CM

## 2016-10-17 DIAGNOSIS — Z1231 Encounter for screening mammogram for malignant neoplasm of breast: Secondary | ICD-10-CM

## 2016-11-15 ENCOUNTER — Ambulatory Visit
Admission: RE | Admit: 2016-11-15 | Discharge: 2016-11-15 | Disposition: A | Discharge status: Home / Self Care | Payer: Medicare Other | Source: Ambulatory Visit | Attending: Internal Medicine | Admitting: Internal Medicine

## 2016-11-15 DIAGNOSIS — M85852 Other specified disorders of bone density and structure, left thigh: Secondary | ICD-10-CM | POA: Diagnosis not present

## 2016-11-15 DIAGNOSIS — Z780 Asymptomatic menopausal state: Secondary | ICD-10-CM | POA: Diagnosis not present

## 2016-11-15 DIAGNOSIS — E2839 Other primary ovarian failure: Secondary | ICD-10-CM

## 2016-11-16 ENCOUNTER — Ambulatory Visit (INDEPENDENT_AMBULATORY_CARE_PROVIDER_SITE_OTHER): Payer: Medicare Other | Admitting: Neurology

## 2016-11-16 ENCOUNTER — Ambulatory Visit (INDEPENDENT_AMBULATORY_CARE_PROVIDER_SITE_OTHER): Payer: Self-pay | Admitting: Neurology

## 2016-11-16 DIAGNOSIS — Z0289 Encounter for other administrative examinations: Secondary | ICD-10-CM

## 2016-11-16 DIAGNOSIS — G5603 Carpal tunnel syndrome, bilateral upper limbs: Secondary | ICD-10-CM

## 2016-11-16 NOTE — Progress Notes (Signed)
See procedure note.

## 2016-11-16 NOTE — Progress Notes (Signed)
Full Name: Robin Odom Gender: Female MRN #: [id number] Date of Birth: [birth year]    Visit Date: 11/16/16 10:42 Age: 66 Years 0 Months Old Examining Physician: Sarina Ill, MD  Referring Physician: Bryon Lions, MD  History: 66 year old here for evaluation of hand pain  Summary:  The right median motor nerve showed delayed distal onset latency (7.6 ms, N<4.4) and reduced amplitude(37mV, N>4). The left median motor nerve showed delayed distal onset latency (6.4 ms, N<4.4) and reduced amplitude(2.68mV, N>4). The right median orthodromic sensory nerve showed no response. The left median orthodromic sensory nerve showed no response. All remaining nerves were normal as detailed below. All muscles are normal as detailed below.   Conclusion: There is moderately-severe bilateral carpal tunnel syndrome. No suggestion of cervical radiculopathy.  Cc: Dr. Dereck Leep, M.D.  [facility] [place], [place] [address] Tel: [phone] Fax: [phone]        [facility]    Nerve / Sites Muscle Latency Ref. Amplitude Ref. Rel Amp Segments Distance Velocity Ref. Area    ms ms mV mV %  cm m/s m/s mVms  R Median - APB     Wrist APB 7.6 ?4.4 3.0 ?4.0 100 Wrist - APB 7   10.0     Upper arm APB 11.3  3.1  102 Upper arm - Wrist 21 56 ?49 11.7  L Median - APB     Wrist APB 6.4 ?4.4 2.4 ?4.0 100 Wrist - APB 7   9.3     Upper arm APB 10.6  2.4  99.7 Upper arm - Wrist 21 50 ?49 9.3  R Ulnar - ADM     Wrist ADM 2.6 ?3.3 8.7 ?6.0 100 Wrist - ADM 7   20.9     B.Elbow ADM 6.1  6.8  77.6 B.Elbow - Wrist 18 51 ?49 17.7     A.Elbow ADM 8.0  6.8  100 A.Elbow - B.Elbow 10 52 ?49 17.6         A.Elbow - Wrist               SNC    Nerve / Sites Rec. Site Peak Lat Ref.  Amp Ref. Segments Distance    ms ms V V  cm  R Median - Orthodromic (Dig II, Mid palm)     Dig II Wrist NR ?3.4 NR ?10 Dig II - Wrist 13  L Median - Orthodromic (Dig II, Mid palm)     Dig II Wrist NR  ?3.4 NR ?10 Dig II - Wrist 13  R Ulnar - Orthodromic, (Dig V, Mid palm)     Dig V Wrist 2.4 ?3.1 5 ?5 Dig V - Wrist 69           F  Wave    Nerve F Lat Ref.   ms ms  R Ulnar - ADM 27.8 ?32.0       EMG full       EMG Summary Table    Spontaneous MUAP Recruitment  Muscle IA Fib PSW Fasc Other Amp Dur. Poly Pattern  L. Deltoid Normal None None None _______ Normal Normal Normal Normal  R. Deltoid Normal None None None _______ Normal Normal Normal Normal  L. Triceps brachii Normal None None None _______ Normal Normal Normal Normal  R. Triceps brachii Normal None None None _______ Normal Normal Normal Normal  L. Pronator teres Normal None None None _______ Normal Normal Normal Normal  R. Pronator teres Normal None None None _______ Normal Normal Normal Normal  L. First dorsal interosseous Normal None None None _______ Normal Normal Normal Normal  R. First dorsal interosseous Normal None None None _______ Normal Normal Normal Normal  L. Opponens pollicis Normal None None None _______ Normal Normal Normal Normal  R. Opponens pollicis Normal None None None _______ Normal Normal Normal Normal  L. Cervical paraspinals (low) Normal None None None _______ Normal Normal Normal Normal  R. Cervical paraspinals (low) Normal None None None _______ Normal Normal Normal Normal

## 2016-11-19 NOTE — Procedures (Signed)
Full Name: Robin Odom Gender: Female MRN #: [id number] Date of Birth: 2050/07/01    Visit Date: 11/16/16 10:42 Age: 66 Years 0 Months Old Examining Physician: Sarina Ill, MD  Referring Physician: Bryon Lions, MD  History: 66 year old here for evaluation of hand pain  Summary:  The right median motor nerve showed delayed distal onset latency (7.6 ms, N<4.4) and reduced amplitude(67mV, N>4). The left median motor nerve showed delayed distal onset latency (6.4 ms, N<4.4) and reduced amplitude(2.41mV, N>4). The right median orthodromic sensory nerve showed no response. The left median orthodromic sensory nerve showed no response. All remaining nerves were normal as detailed below. All muscles are normal as detailed below.   Conclusion: There is moderately-severe bilateral carpal tunnel syndrome. No suggestion of cervical radiculopathy.  Cc: Dr. Dereck Leep, M.D.  [facility] [place], [place] [address] Tel: [phone] Fax: [phone]        [facility]    Nerve / Sites Muscle Latency Ref. Amplitude Ref. Rel Amp Segments Distance Velocity Ref. Area    ms ms mV mV %  cm m/s m/s mVms  R Median - APB     Wrist APB 7.6 ?4.4 3.0 ?4.0 100 Wrist - APB 7   10.0     Upper arm APB 11.3  3.1  102 Upper arm - Wrist 21 56 ?49 11.7  L Median - APB     Wrist APB 6.4 ?4.4 2.4 ?4.0 100 Wrist - APB 7   9.3     Upper arm APB 10.6  2.4  99.7 Upper arm - Wrist 21 50 ?49 9.3  R Ulnar - ADM     Wrist ADM 2.6 ?3.3 8.7 ?6.0 100 Wrist - ADM 7   20.9     B.Elbow ADM 6.1  6.8  77.6 B.Elbow - Wrist 18 51 ?49 17.7     A.Elbow ADM 8.0  6.8  100 A.Elbow - B.Elbow 10 52 ?49 17.6         A.Elbow - Wrist               SNC    Nerve / Sites Rec. Site Peak Lat Ref.  Amp Ref. Segments Distance    ms ms V V  cm  R Median - Orthodromic (Dig II, Mid palm)     Dig II Wrist NR ?3.4 NR ?10 Dig II - Wrist 13  L Median - Orthodromic (Dig II, Mid palm)     Dig II Wrist NR  ?3.4 NR ?10 Dig II - Wrist 13  R Ulnar - Orthodromic, (Dig V, Mid palm)     Dig V Wrist 2.4 ?3.1 5 ?5 Dig V - Wrist 15           F  Wave    Nerve F Lat Ref.   ms ms  R Ulnar - ADM 27.8 ?32.0       EMG full       EMG Summary Table    Spontaneous MUAP Recruitment  Muscle IA Fib PSW Fasc Other Amp Dur. Poly Pattern  L. Deltoid Normal None None None _______ Normal Normal Normal Normal  R. Deltoid Normal None None None _______ Normal Normal Normal Normal  L. Triceps brachii Normal None None None _______ Normal Normal Normal Normal  R. Triceps brachii Normal None None None _______ Normal Normal Normal Normal  L. Pronator teres Normal None None None _______ Normal Normal Normal Normal  R. Pronator teres Normal None None None _______ Normal Normal Normal Normal  L. First dorsal interosseous Normal None None None _______ Normal Normal Normal Normal  R. First dorsal interosseous Normal None None None _______ Normal Normal Normal Normal  L. Opponens pollicis Normal None None None _______ Normal Normal Normal Normal  R. Opponens pollicis Normal None None None _______ Normal Normal Normal Normal  L. Cervical paraspinals (low) Normal None None None _______ Normal Normal Normal Normal  R. Cervical paraspinals (low) Normal None None None _______ Normal Normal Normal Normal

## 2016-11-30 ENCOUNTER — Encounter: Payer: Self-pay | Admitting: Internal Medicine

## 2016-12-27 ENCOUNTER — Ambulatory Visit
Admission: RE | Admit: 2016-12-27 | Discharge: 2016-12-27 | Disposition: A | Discharge status: Home / Self Care | Payer: Medicare Other | Source: Ambulatory Visit | Attending: Internal Medicine | Admitting: Internal Medicine

## 2016-12-27 DIAGNOSIS — Z1231 Encounter for screening mammogram for malignant neoplasm of breast: Secondary | ICD-10-CM

## 2017-01-31 ENCOUNTER — Other Ambulatory Visit: Payer: Self-pay | Admitting: Allergy and Immunology

## 2017-01-31 DIAGNOSIS — R430 Anosmia: Secondary | ICD-10-CM

## 2017-01-31 DIAGNOSIS — J310 Chronic rhinitis: Secondary | ICD-10-CM

## 2017-02-05 DIAGNOSIS — Z23 Encounter for immunization: Secondary | ICD-10-CM | POA: Diagnosis not present

## 2017-02-06 DIAGNOSIS — M109 Gout, unspecified: Secondary | ICD-10-CM | POA: Diagnosis not present

## 2017-02-06 DIAGNOSIS — E559 Vitamin D deficiency, unspecified: Secondary | ICD-10-CM | POA: Diagnosis not present

## 2017-02-06 DIAGNOSIS — R7303 Prediabetes: Secondary | ICD-10-CM | POA: Diagnosis not present

## 2017-02-06 DIAGNOSIS — Z0000 Encounter for general adult medical examination without abnormal findings: Secondary | ICD-10-CM | POA: Diagnosis not present

## 2017-02-06 DIAGNOSIS — Z1389 Encounter for screening for other disorder: Secondary | ICD-10-CM | POA: Diagnosis not present

## 2017-02-06 DIAGNOSIS — I10 Essential (primary) hypertension: Secondary | ICD-10-CM | POA: Diagnosis not present

## 2017-02-07 NOTE — Progress Notes (Signed)
Office Visit Note  Patient: Robin Odom             Date of Birth: 1950/07/20           MRN: [id number]             PCP: Glendale Chard, MD Referring: Glendale Chard, MD Visit Date: 02/20/2017 Occupation: @GUAROCC @    Subjective:  Joint stiffness.   History of Present Illness: Donyetta Ogletree is a 66 y.o. female with history of gout and osteoarthritis. She denies any gout flare in long time. She is not taking colchicine for a long time as well. She continues to have some joint stiffness due to her underlying osteoarthritis but not much discomfort. She states she has bilateral carpal tunnel syndrome which is quite severe and she is waiting to get surgery for carpal tunnel release by Dr. Amedeo Plenty.  Activities of Daily Living:  Patient reports morning stiffness for 0 minute.   Patient Denies nocturnal pain.  Difficulty dressing/grooming: Denies Difficulty climbing stairs: Denies Difficulty getting out of chair: Denies Difficulty using hands for taps, buttons, cutlery, and/or writing: Reports   Review of Systems  Constitutional: Negative.  Negative for fatigue, night sweats, weight gain, weight loss and weakness.  HENT: Negative.  Negative for mouth sores, trouble swallowing, trouble swallowing, mouth dryness and nose dryness.   Eyes: Negative.  Negative for pain, redness, visual disturbance and dryness.  Respiratory: Negative.  Negative for cough, shortness of breath and difficulty breathing.   Cardiovascular: Positive for hypertension. Negative for chest pain, palpitations, irregular heartbeat and swelling in legs/feet.  Gastrointestinal: Negative.  Negative for blood in stool, constipation and diarrhea.  Endocrine: Negative for increased urination.  Genitourinary: Negative for vaginal dryness.  Musculoskeletal: Positive for arthralgias, joint pain and morning stiffness. Negative for joint swelling, myalgias, muscle weakness, muscle tenderness and myalgias.  Skin: Negative.  Negative for  color change, rash, hair loss, skin tightness, ulcers and sensitivity to sunlight.  Allergic/Immunologic: Negative for susceptible to infections.  Neurological: Negative.  Negative for dizziness, numbness, headaches, memory loss and night sweats.  Hematological: Negative for swollen glands.  Psychiatric/Behavioral: Negative.  Negative for depressed mood and sleep disturbance. The patient is not nervous/anxious.     PMFS History:  Patient Active Problem List   Diagnosis Date Noted  . 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 tophus 07/20/2016  . Primary osteoarthritis of both hands 07/20/2016  . Primary osteoarthritis of both knees 07/20/2016  . Primary osteoarthritis of both feet 07/20/2016  . DJD (degenerative joint disease), cervical 07/20/2016  . Elevated CK 07/20/2016  . History of humerus fracture 07/20/2016  . History of hypertension 07/20/2016  . History of diabetes mellitus 07/20/2016  . History of hyperlipidemia 07/20/2016  . Chronic rhinitis 10/20/2015  . Anosmia 10/20/2015  . Mild intermittent asthma 10/20/2015    Past Medical History:  Diagnosis Date  . Arthritis   . Diabetes mellitus without complication ([place])   . Gout   . Hypertension     Family History  Problem Relation Age of Onset  . Heart disease Mother   . Arthritis Father   . Hypertension Father   . Prostate cancer Father   . Parkinson's disease Father   . Sinusitis Sister   . Allergic rhinitis Sister   . Angioedema Neg Hx   . Asthma Neg Hx   . Eczema Neg Hx   . Immunodeficiency Neg Hx   . Urticaria Neg Hx    Past Surgical History:  Procedure Laterality  Date  . ABDOMINAL HYSTERECTOMY    . Left knee surgery  2006  . SHOULDER SURGERY  2001/2003  . TONSILECTOMY/ADENOIDECTOMY WITH MYRINGOTOMY  1971   Social History   Social History Narrative  . No narrative on file     Objective: Vital Signs: BP (!) 156/95 (BP Location: Left Arm, Patient Position: Sitting, Cuff Size: Normal)    Pulse 76   Ht 5\' 5"  (1.651 m)   Wt 249 lb (112.9 kg)   BMI 41.44 kg/m    Physical Exam  Constitutional: She is oriented to person, place, and time. She appears well-developed and well-nourished.  HENT:  Head: Normocephalic and atraumatic.  Eyes: Conjunctivae and EOM are normal.  Neck: Normal range of motion.  Cardiovascular: Normal rate, regular rhythm, normal heart sounds and intact distal pulses.   Pulmonary/Chest: Effort normal and breath sounds normal.  Abdominal: Soft. Bowel sounds are normal.  Lymphadenopathy:    She has no cervical adenopathy.  Neurological: She is alert and oriented to person, place, and time.  Skin: Skin is warm and dry. Capillary refill takes less than 2 seconds.  Psychiatric: She has a normal mood and affect. Her behavior is normal.  Nursing note and vitals reviewed.    Musculoskeletal Exam: C-spine, thoracic and lumbar lumbar spine good range of motion. Shoulder joints elbow joints wrist joints MCPs PIPs DIPs with good range of motion with no synovitis. Hip joints knee joints ankles MTPs PIPs with good range of motion with no synovitis. She has some crepitus in her bilateral knee joints. No warmth swelling or effusion was noted. No tophi were noted.  CDAI Exam: No CDAI exam completed.    Investigation: No additional findings.Uric acid: 4.5 in 07/2016 CBC Latest Ref Rng & Units 08/21/2016  WBC 3.8 - 10.8 K/uL 10.9(H)  Hemoglobin 11.7 - 15.5 g/dL 12.8  Hematocrit 35.0 - 45.0 % 40.5  Platelets 140 - 400 K/uL 343   CMP Latest Ref Rng & Units 08/21/2016  Glucose 65 - 99 mg/dL 98  BUN 7 - 25 mg/dL 15  Creatinine 0.50 - 0.99 mg/dL 0.82  Sodium 135 - 146 mmol/L 143  Potassium 3.5 - 5.3 mmol/L 4.2  Chloride 98 - 110 mmol/L 107  CO2 20 - 31 mmol/L 27  Calcium 8.6 - 10.4 mg/dL 9.7  Total Protein 6.1 - 8.1 g/dL 7.1  Total Bilirubin 0.2 - 1.2 mg/dL 0.4  Alkaline Phos 33 - 130 U/L 59  AST 10 - 35 U/L 23  ALT 6 - 29 U/L 17    Imaging: No results  found.  Speciality Comments: No specialty comments available.    Procedures:  No procedures performed Allergies: Codeine and Penicillins   Assessment / Plan:     Visit Diagnoses: 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 tophus - allopurinol 300 mg po qd, colchicine 0.6 mg po qd prn.Uric acid: 4.5 in 07/2016 -Patient has not had any gout flare. She has not used colchicine in a long time. Plan: Uric acid today. Refill for allopurinol was given.  DDD (degenerative disc disease), cervical: Minimal discomfort  Primary osteoarthritis of both hands: She continues to have some stiffness.  Bilateral carpal tunnel syndrome: She she will be scheduling appointment with Dr. Amedeo Plenty.  Primary osteoarthritis of both knees: Chronic pain. Weight loss diet and exercise was discussed.  Primary osteoarthritis of both feet: Proper fitting shoes were discussed.  Patient states that she's been getting bone density through her PCP and it has been normal.  Her other  medical problems are listed as follows:  History of humerus fracture  History of diabetes mellitus  History of hyperlipidemia  History of hypertension - I've advised to monitor blood pressure closely and follow up with her PCP.  Medication monitoring encounter - Plan: CBC with Differential/Platelet, COMPLETE METABOLIC PANEL WITH GFR    Orders: Orders Placed This Encounter  Procedures  . CBC with Differential/Platelet  . COMPLETE METABOLIC PANEL WITH GFR  . Uric acid   No orders of the defined types were placed in this encounter.    Follow-Up Instructions: Return in about 6 months (around 08/21/2017) for Osteoarthritis, Gout.   Bo Merino, MD  Note - This record has been created using Editor, commissioning.  Chart creation errors have been sought, but may not always  have been located. Such creation errors do not reflect on  the standard of medical care.

## 2017-02-08 DIAGNOSIS — M6701 Short Achilles tendon (acquired), right ankle: Secondary | ICD-10-CM | POA: Insufficient documentation

## 2017-02-08 DIAGNOSIS — M722 Plantar fascial fibromatosis: Secondary | ICD-10-CM | POA: Diagnosis not present

## 2017-02-08 DIAGNOSIS — M2142 Flat foot [pes planus] (acquired), left foot: Secondary | ICD-10-CM | POA: Diagnosis not present

## 2017-02-08 DIAGNOSIS — M2141 Flat foot [pes planus] (acquired), right foot: Secondary | ICD-10-CM | POA: Diagnosis not present

## 2017-02-13 ENCOUNTER — Encounter: Payer: Self-pay | Admitting: Allergy and Immunology

## 2017-02-13 ENCOUNTER — Ambulatory Visit (INDEPENDENT_AMBULATORY_CARE_PROVIDER_SITE_OTHER): Payer: Medicare Other | Admitting: Allergy and Immunology

## 2017-02-13 ENCOUNTER — Other Ambulatory Visit: Payer: Self-pay | Admitting: *Deleted

## 2017-02-13 VITALS — BP 130/90 | HR 80 | Resp 20 | Ht 65.5 in | Wt 256.4 lb

## 2017-02-13 DIAGNOSIS — J310 Chronic rhinitis: Secondary | ICD-10-CM | POA: Diagnosis not present

## 2017-02-13 DIAGNOSIS — J4521 Mild intermittent asthma with (acute) exacerbation: Secondary | ICD-10-CM | POA: Diagnosis not present

## 2017-02-13 MED ORDER — ALBUTEROL SULFATE HFA 108 (90 BASE) MCG/ACT IN AERS: 2.0000 | INHALATION_SPRAY | Freq: Four times a day (QID) | RESPIRATORY_TRACT | 3 refills | Status: DC | PRN

## 2017-02-13 MED ORDER — FLUTICASONE PROPIONATE HFA 110 MCG/ACT IN AERO: 2.0000 | INHALATION_SPRAY | Freq: Two times a day (BID) | RESPIRATORY_TRACT | 5 refills | Status: DC

## 2017-02-13 NOTE — Patient Instructions (Addendum)
Mild intermittent asthma Currently with sub-optimal control.  For now, and during respiratory tract infections or asthma flares, add Flovent 110g 2 inhalations 2 times per day until symptoms have returned to baseline. To maximize pulmonary deposition, a spacer has been provided along with instructions for its proper administration with an HFA inhaler.  Continue albuterol HFA, 1-2 inhalations every 4-6 hours as needed.  The patient has been asked to contact me if her symptoms persist or progress. Otherwise, she may return for follow up in 4 months.  Chronic rhinitis Stable.  Continue fluticasone nasal spray as needed, and nasal saline irrigation as needed, and guaifenesin (Mucinex) if needed.   Return in about 4 months (around 06/16/2017), or if symptoms worsen or fail to improve.
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
## 2017-02-13 NOTE — Assessment & Plan Note (Signed)
Currently with sub-optimal control.  For now, and during respiratory tract infections or asthma flares, add Flovent 110g 2 inhalations 2 times per day until symptoms have returned to baseline. To maximize pulmonary deposition, a spacer has been provided along with instructions for its proper administration with an HFA inhaler.  Continue albuterol HFA, 1-2 inhalations every 4-6 hours as needed.  The patient has been asked to contact me if her symptoms persist or progress. Otherwise, she may return for follow up in 4 months.

## 2017-02-13 NOTE — Assessment & Plan Note (Signed)
Stable.  Continue fluticasone nasal spray as needed, and nasal saline irrigation as needed, and guaifenesin (Mucinex) if needed.

## 2017-02-20 ENCOUNTER — Encounter: Payer: Self-pay | Admitting: Rheumatology

## 2017-02-20 ENCOUNTER — Other Ambulatory Visit: Payer: Self-pay | Admitting: Rheumatology

## 2017-02-20 ENCOUNTER — Ambulatory Visit (INDEPENDENT_AMBULATORY_CARE_PROVIDER_SITE_OTHER): Payer: Medicare Other | Admitting: Rheumatology

## 2017-02-20 ENCOUNTER — Ambulatory Visit: Payer: Medicare Other | Admitting: Rheumatology

## 2017-02-20 VITALS — BP 156/95 | HR 76 | Ht 65.0 in | Wt 249.0 lb

## 2017-02-20 DIAGNOSIS — M5030 Other cervical disc degeneration, unspecified cervical region: Secondary | ICD-10-CM

## 2017-02-20 DIAGNOSIS — Z8639 Personal history of other endocrine, nutritional and metabolic disease: Secondary | ICD-10-CM | POA: Diagnosis not present

## 2017-02-20 DIAGNOSIS — M19041 Primary osteoarthritis, right hand: Secondary | ICD-10-CM | POA: Diagnosis not present

## 2017-02-20 DIAGNOSIS — Z8781 Personal history of (healed) traumatic fracture: Secondary | ICD-10-CM

## 2017-02-20 DIAGNOSIS — M19042 Primary osteoarthritis, left hand: Secondary | ICD-10-CM | POA: Diagnosis not present

## 2017-02-20 DIAGNOSIS — M170 Bilateral primary osteoarthritis of knee: Secondary | ICD-10-CM

## 2017-02-20 DIAGNOSIS — G5603 Carpal tunnel syndrome, bilateral upper limbs: Secondary | ICD-10-CM

## 2017-02-20 DIAGNOSIS — M19071 Primary osteoarthritis, right ankle and foot: Secondary | ICD-10-CM | POA: Diagnosis not present

## 2017-02-20 DIAGNOSIS — Z5181 Encounter for therapeutic drug level monitoring: Secondary | ICD-10-CM

## 2017-02-20 DIAGNOSIS — Z8679 Personal history of other diseases of the circulatory system: Secondary | ICD-10-CM

## 2017-02-20 DIAGNOSIS — M19072 Primary osteoarthritis, left ankle and foot: Secondary | ICD-10-CM | POA: Diagnosis not present

## 2017-02-20 DIAGNOSIS — M1A09X0 Idiopathic chronic gout, multiple sites, without tophus (tophi): Secondary | ICD-10-CM

## 2017-02-20 LAB — CBC WITH DIFFERENTIAL/PLATELET
Basophils Absolute: 53 cells/uL (ref 0–200)
Basophils Relative: 0.5 %
Eosinophils Absolute: 117 cells/uL (ref 15–500)
Eosinophils Relative: 1.1 %
HCT: 41.1 % (ref 35.0–45.0)
Hemoglobin: 13.4 g/dL (ref 11.7–15.5)
Lymphs Abs: 2608 cells/uL (ref 850–3900)
MCH: 25.8 pg — ABNORMAL LOW (ref 27.0–33.0)
MCHC: 32.6 g/dL (ref 32.0–36.0)
MCV: 79 fL — ABNORMAL LOW (ref 80.0–100.0)
MPV: 10.1 fL (ref 7.5–12.5)
Monocytes Relative: 6.9 %
Neutro Abs: 7091 cells/uL (ref 1500–7800)
Neutrophils Relative %: 66.9 %
Platelets: 344 10*3/uL (ref 140–400)
RBC: 5.2 10*6/uL — ABNORMAL HIGH (ref 3.80–5.10)
RDW: 15 % (ref 11.0–15.0)
Total Lymphocyte: 24.6 %
WBC mixed population: 731 cells/uL (ref 200–950)
WBC: 10.6 10*3/uL (ref 3.8–10.8)

## 2017-02-20 LAB — COMPLETE METABOLIC PANEL WITH GFR
AG Ratio: 1.4 (calc) (ref 1.0–2.5)
ALT: 16 U/L (ref 6–29)
AST: 20 U/L (ref 10–35)
Albumin: 4 g/dL (ref 3.6–5.1)
Alkaline phosphatase (APISO): 63 U/L (ref 33–130)
BUN: 14 mg/dL (ref 7–25)
CO2: 28 mmol/L (ref 20–32)
Calcium: 9.1 mg/dL (ref 8.6–10.4)
Chloride: 105 mmol/L (ref 98–110)
Creat: 0.67 mg/dL (ref 0.50–0.99)
GFR, Est African American: 105 mL/min/{1.73_m2} (ref >=60)
GFR, Est Non African American: 91 mL/min/{1.73_m2} (ref >=60)
Globulin: 2.9 g/dL (calc) (ref 1.9–3.7)
Glucose, Bld: 81 mg/dL (ref 65–99)
Potassium: 3.7 mmol/L (ref 3.5–5.3)
Sodium: 141 mmol/L (ref 135–146)
Total Bilirubin: 0.4 mg/dL (ref 0.2–1.2)
Total Protein: 6.9 g/dL (ref 6.1–8.1)

## 2017-02-20 LAB — URIC ACID: Uric Acid, Serum: 4.8 mg/dL (ref 2.5–7.0)

## 2017-02-20 NOTE — Telephone Encounter (Signed)
Last Visit: 08/21/16 Next Visit 02/20/17 Labs: 08/21/16 Stable  Okay to refill per Dr. Estanislado Pandy

## 2017-02-21 NOTE — Progress Notes (Signed)
WNL

## 2017-02-28 DIAGNOSIS — M25672 Stiffness of left ankle, not elsewhere classified: Secondary | ICD-10-CM | POA: Diagnosis not present

## 2017-02-28 DIAGNOSIS — M25671 Stiffness of right ankle, not elsewhere classified: Secondary | ICD-10-CM | POA: Diagnosis not present

## 2017-02-28 DIAGNOSIS — M79671 Pain in right foot: Secondary | ICD-10-CM | POA: Diagnosis not present

## 2017-02-28 DIAGNOSIS — M6701 Short Achilles tendon (acquired), right ankle: Secondary | ICD-10-CM | POA: Diagnosis not present

## 2017-02-28 DIAGNOSIS — M79672 Pain in left foot: Secondary | ICD-10-CM | POA: Diagnosis not present

## 2017-03-05 ENCOUNTER — Other Ambulatory Visit: Payer: Self-pay | Admitting: Allergy and Immunology

## 2017-03-05 DIAGNOSIS — R430 Anosmia: Secondary | ICD-10-CM

## 2017-03-05 DIAGNOSIS — J310 Chronic rhinitis: Secondary | ICD-10-CM

## 2017-03-07 DIAGNOSIS — M25671 Stiffness of right ankle, not elsewhere classified: Secondary | ICD-10-CM | POA: Diagnosis not present

## 2017-03-07 DIAGNOSIS — M6701 Short Achilles tendon (acquired), right ankle: Secondary | ICD-10-CM | POA: Diagnosis not present

## 2017-03-07 DIAGNOSIS — M79672 Pain in left foot: Secondary | ICD-10-CM | POA: Diagnosis not present

## 2017-03-07 DIAGNOSIS — M25672 Stiffness of left ankle, not elsewhere classified: Secondary | ICD-10-CM | POA: Diagnosis not present

## 2017-03-07 DIAGNOSIS — M79671 Pain in right foot: Secondary | ICD-10-CM | POA: Diagnosis not present

## 2017-03-12 DIAGNOSIS — M6701 Short Achilles tendon (acquired), right ankle: Secondary | ICD-10-CM | POA: Diagnosis not present

## 2017-03-12 DIAGNOSIS — M25672 Stiffness of left ankle, not elsewhere classified: Secondary | ICD-10-CM | POA: Diagnosis not present

## 2017-03-12 DIAGNOSIS — M79671 Pain in right foot: Secondary | ICD-10-CM | POA: Diagnosis not present

## 2017-03-12 DIAGNOSIS — M25671 Stiffness of right ankle, not elsewhere classified: Secondary | ICD-10-CM | POA: Diagnosis not present

## 2017-03-12 DIAGNOSIS — M2550 Pain in unspecified joint: Secondary | ICD-10-CM | POA: Diagnosis not present

## 2017-03-12 DIAGNOSIS — M79672 Pain in left foot: Secondary | ICD-10-CM | POA: Diagnosis not present

## 2017-03-12 DIAGNOSIS — I10 Essential (primary) hypertension: Secondary | ICD-10-CM | POA: Diagnosis not present

## 2017-03-12 DIAGNOSIS — R7303 Prediabetes: Secondary | ICD-10-CM | POA: Diagnosis not present

## 2017-03-28 DIAGNOSIS — M6701 Short Achilles tendon (acquired), right ankle: Secondary | ICD-10-CM | POA: Diagnosis not present

## 2017-03-28 DIAGNOSIS — M79672 Pain in left foot: Secondary | ICD-10-CM | POA: Diagnosis not present

## 2017-03-28 DIAGNOSIS — M79671 Pain in right foot: Secondary | ICD-10-CM | POA: Diagnosis not present

## 2017-03-28 DIAGNOSIS — M25672 Stiffness of left ankle, not elsewhere classified: Secondary | ICD-10-CM | POA: Diagnosis not present

## 2017-03-28 DIAGNOSIS — M25671 Stiffness of right ankle, not elsewhere classified: Secondary | ICD-10-CM | POA: Diagnosis not present

## 2017-04-02 DIAGNOSIS — M25671 Stiffness of right ankle, not elsewhere classified: Secondary | ICD-10-CM | POA: Diagnosis not present

## 2017-04-02 DIAGNOSIS — M6701 Short Achilles tendon (acquired), right ankle: Secondary | ICD-10-CM | POA: Diagnosis not present

## 2017-04-02 DIAGNOSIS — M25672 Stiffness of left ankle, not elsewhere classified: Secondary | ICD-10-CM | POA: Diagnosis not present

## 2017-04-02 DIAGNOSIS — M79671 Pain in right foot: Secondary | ICD-10-CM | POA: Diagnosis not present

## 2017-04-02 DIAGNOSIS — M79672 Pain in left foot: Secondary | ICD-10-CM | POA: Diagnosis not present

## 2017-04-04 DIAGNOSIS — M25671 Stiffness of right ankle, not elsewhere classified: Secondary | ICD-10-CM | POA: Diagnosis not present

## 2017-04-04 DIAGNOSIS — M25672 Stiffness of left ankle, not elsewhere classified: Secondary | ICD-10-CM | POA: Diagnosis not present

## 2017-04-04 DIAGNOSIS — M79671 Pain in right foot: Secondary | ICD-10-CM | POA: Diagnosis not present

## 2017-04-04 DIAGNOSIS — M79672 Pain in left foot: Secondary | ICD-10-CM | POA: Diagnosis not present

## 2017-04-04 DIAGNOSIS — M6701 Short Achilles tendon (acquired), right ankle: Secondary | ICD-10-CM | POA: Diagnosis not present

## 2017-04-05 DIAGNOSIS — M722 Plantar fascial fibromatosis: Secondary | ICD-10-CM | POA: Diagnosis not present

## 2017-04-05 DIAGNOSIS — M7742 Metatarsalgia, left foot: Secondary | ICD-10-CM | POA: Diagnosis not present

## 2017-04-05 DIAGNOSIS — M7741 Metatarsalgia, right foot: Secondary | ICD-10-CM | POA: Diagnosis not present

## 2017-04-12 DIAGNOSIS — M6701 Short Achilles tendon (acquired), right ankle: Secondary | ICD-10-CM | POA: Diagnosis not present

## 2017-04-12 DIAGNOSIS — M25672 Stiffness of left ankle, not elsewhere classified: Secondary | ICD-10-CM | POA: Diagnosis not present

## 2017-04-12 DIAGNOSIS — M79672 Pain in left foot: Secondary | ICD-10-CM | POA: Diagnosis not present

## 2017-04-12 DIAGNOSIS — M25671 Stiffness of right ankle, not elsewhere classified: Secondary | ICD-10-CM | POA: Diagnosis not present

## 2017-04-12 DIAGNOSIS — M79671 Pain in right foot: Secondary | ICD-10-CM | POA: Diagnosis not present

## 2017-04-18 DIAGNOSIS — M6701 Short Achilles tendon (acquired), right ankle: Secondary | ICD-10-CM | POA: Diagnosis not present

## 2017-04-18 DIAGNOSIS — Z79899 Other long term (current) drug therapy: Secondary | ICD-10-CM | POA: Diagnosis not present

## 2017-04-18 DIAGNOSIS — M25672 Stiffness of left ankle, not elsewhere classified: Secondary | ICD-10-CM | POA: Diagnosis not present

## 2017-04-18 DIAGNOSIS — M79672 Pain in left foot: Secondary | ICD-10-CM | POA: Diagnosis not present

## 2017-04-18 DIAGNOSIS — M25671 Stiffness of right ankle, not elsewhere classified: Secondary | ICD-10-CM | POA: Diagnosis not present

## 2017-04-18 DIAGNOSIS — M79671 Pain in right foot: Secondary | ICD-10-CM | POA: Diagnosis not present

## 2017-05-01 HISTORY — PX: CARPAL TUNNEL RELEASE: SHX101

## 2017-05-02 DIAGNOSIS — M79671 Pain in right foot: Secondary | ICD-10-CM | POA: Diagnosis not present

## 2017-05-02 DIAGNOSIS — M79672 Pain in left foot: Secondary | ICD-10-CM | POA: Diagnosis not present

## 2017-05-02 DIAGNOSIS — M6701 Short Achilles tendon (acquired), right ankle: Secondary | ICD-10-CM | POA: Diagnosis not present

## 2017-05-02 DIAGNOSIS — M25671 Stiffness of right ankle, not elsewhere classified: Secondary | ICD-10-CM | POA: Diagnosis not present

## 2017-05-02 DIAGNOSIS — M25672 Stiffness of left ankle, not elsewhere classified: Secondary | ICD-10-CM | POA: Diagnosis not present

## 2017-05-04 DIAGNOSIS — M79672 Pain in left foot: Secondary | ICD-10-CM | POA: Diagnosis not present

## 2017-05-04 DIAGNOSIS — M25672 Stiffness of left ankle, not elsewhere classified: Secondary | ICD-10-CM | POA: Diagnosis not present

## 2017-05-04 DIAGNOSIS — M25671 Stiffness of right ankle, not elsewhere classified: Secondary | ICD-10-CM | POA: Diagnosis not present

## 2017-05-04 DIAGNOSIS — M79671 Pain in right foot: Secondary | ICD-10-CM | POA: Diagnosis not present

## 2017-05-04 DIAGNOSIS — M6701 Short Achilles tendon (acquired), right ankle: Secondary | ICD-10-CM | POA: Diagnosis not present

## 2017-05-11 DIAGNOSIS — M6701 Short Achilles tendon (acquired), right ankle: Secondary | ICD-10-CM | POA: Diagnosis not present

## 2017-05-11 DIAGNOSIS — M25671 Stiffness of right ankle, not elsewhere classified: Secondary | ICD-10-CM | POA: Diagnosis not present

## 2017-05-11 DIAGNOSIS — M79672 Pain in left foot: Secondary | ICD-10-CM | POA: Diagnosis not present

## 2017-05-11 DIAGNOSIS — M25672 Stiffness of left ankle, not elsewhere classified: Secondary | ICD-10-CM | POA: Diagnosis not present

## 2017-05-11 DIAGNOSIS — M79671 Pain in right foot: Secondary | ICD-10-CM | POA: Diagnosis not present

## 2017-05-16 DIAGNOSIS — Z01419 Encounter for gynecological examination (general) (routine) without abnormal findings: Secondary | ICD-10-CM | POA: Diagnosis not present

## 2017-05-16 DIAGNOSIS — Z6838 Body mass index (BMI) 38.0-38.9, adult: Secondary | ICD-10-CM | POA: Diagnosis not present

## 2017-05-16 DIAGNOSIS — N762 Acute vulvitis: Secondary | ICD-10-CM | POA: Diagnosis not present

## 2017-05-17 DIAGNOSIS — M25672 Stiffness of left ankle, not elsewhere classified: Secondary | ICD-10-CM | POA: Diagnosis not present

## 2017-05-17 DIAGNOSIS — M6701 Short Achilles tendon (acquired), right ankle: Secondary | ICD-10-CM | POA: Diagnosis not present

## 2017-05-17 DIAGNOSIS — M25671 Stiffness of right ankle, not elsewhere classified: Secondary | ICD-10-CM | POA: Diagnosis not present

## 2017-05-17 DIAGNOSIS — M79671 Pain in right foot: Secondary | ICD-10-CM | POA: Diagnosis not present

## 2017-05-17 DIAGNOSIS — M79672 Pain in left foot: Secondary | ICD-10-CM | POA: Diagnosis not present

## 2017-05-23 DIAGNOSIS — M79671 Pain in right foot: Secondary | ICD-10-CM | POA: Diagnosis not present

## 2017-05-23 DIAGNOSIS — M6701 Short Achilles tendon (acquired), right ankle: Secondary | ICD-10-CM | POA: Diagnosis not present

## 2017-05-23 DIAGNOSIS — M79672 Pain in left foot: Secondary | ICD-10-CM | POA: Diagnosis not present

## 2017-05-23 DIAGNOSIS — M25672 Stiffness of left ankle, not elsewhere classified: Secondary | ICD-10-CM | POA: Diagnosis not present

## 2017-05-23 DIAGNOSIS — M25671 Stiffness of right ankle, not elsewhere classified: Secondary | ICD-10-CM | POA: Diagnosis not present

## 2017-05-30 DIAGNOSIS — M79641 Pain in right hand: Secondary | ICD-10-CM | POA: Diagnosis not present

## 2017-05-30 DIAGNOSIS — G5601 Carpal tunnel syndrome, right upper limb: Secondary | ICD-10-CM | POA: Diagnosis not present

## 2017-06-08 DIAGNOSIS — M79671 Pain in right foot: Secondary | ICD-10-CM | POA: Diagnosis not present

## 2017-06-08 DIAGNOSIS — M25671 Stiffness of right ankle, not elsewhere classified: Secondary | ICD-10-CM | POA: Diagnosis not present

## 2017-06-08 DIAGNOSIS — M79672 Pain in left foot: Secondary | ICD-10-CM | POA: Diagnosis not present

## 2017-06-08 DIAGNOSIS — M6701 Short Achilles tendon (acquired), right ankle: Secondary | ICD-10-CM | POA: Diagnosis not present

## 2017-06-08 DIAGNOSIS — M25672 Stiffness of left ankle, not elsewhere classified: Secondary | ICD-10-CM | POA: Diagnosis not present

## 2017-06-13 DIAGNOSIS — R609 Edema, unspecified: Secondary | ICD-10-CM | POA: Diagnosis not present

## 2017-06-13 DIAGNOSIS — I10 Essential (primary) hypertension: Secondary | ICD-10-CM | POA: Diagnosis not present

## 2017-06-13 DIAGNOSIS — R7309 Other abnormal glucose: Secondary | ICD-10-CM | POA: Diagnosis not present

## 2017-06-13 DIAGNOSIS — Z6839 Body mass index (BMI) 39.0-39.9, adult: Secondary | ICD-10-CM | POA: Diagnosis not present

## 2017-06-21 ENCOUNTER — Ambulatory Visit: Payer: Medicare Other | Admitting: Allergy and Immunology

## 2017-07-09 ENCOUNTER — Encounter: Payer: Self-pay | Admitting: Allergy and Immunology

## 2017-07-09 ENCOUNTER — Ambulatory Visit (INDEPENDENT_AMBULATORY_CARE_PROVIDER_SITE_OTHER): Payer: Medicare Other | Admitting: Allergy and Immunology

## 2017-07-09 VITALS — BP 132/76 | HR 78 | Resp 18

## 2017-07-09 DIAGNOSIS — J4520 Mild intermittent asthma, uncomplicated: Secondary | ICD-10-CM | POA: Diagnosis not present

## 2017-07-09 DIAGNOSIS — J310 Chronic rhinitis: Secondary | ICD-10-CM

## 2017-07-09 MED ORDER — CARBINOXAMINE MALEATE 4 MG PO TABS
4.0000 mg | ORAL_TABLET | Freq: Every day | ORAL | 5 refills | Status: DC | PRN
Start: 2017-07-09 — End: 2019-11-17

## 2017-07-09 MED ORDER — IPRATROPIUM BROMIDE 0.06 % NA SOLN: 2.0000 | Freq: Three times a day (TID) | NASAL | 5 refills | Status: DC

## 2017-07-09 NOTE — Assessment & Plan Note (Signed)
Currently with suboptimal control.  Discontinue fexofenadine (Allegra).  A prescription has been provided for carbinoxamine 4 mg, 1 tablet every 8 hours if needed.  A prescription has been provided for ipratropium 0.06% nasal spray, 2 sprays per nostril every 8 hours if needed.  Nasal saline lavage (NeilMed) has been recommended as needed and prior to medicated nasal sprays along with instructions for proper administration.

## 2017-07-09 NOTE — Assessment & Plan Note (Signed)
   Continue albuterol HFA, 1-2 inhalations every 4-6 hours as needed.  During respiratory tract infections or asthma flares, add Flovent 110g 2 inhalations 2 times per day until symptoms have returned to baseline. To maximize pulmonary deposition, a spacer has been provided along with instructions for its proper administration with an HFA inhaler.  The patient has been asked to contact me if her symptoms persist or progress. Otherwise, she may return for follow up in 4 months.

## 2017-07-09 NOTE — Progress Notes (Signed)
Follow-up Note  RE: Robin Odom MRN: [id number] DOB: 09/07/1950 Date of Office Visit: 07/09/2017  Primary care provider: Glendale Chard, MD Referring provider: Glendale Chard, MD  History of present illness: Robin Odom is a 67 y.o. female with asthma and chronic rhinitis presenting today for follow-up.  She was last seen in this clinic in October 2018.  She reports that recently she has had nasal congestion, a persistent runny nose, and has been "sneezing like crazy".  She believes that these nasal symptoms are due to the rapid weather/temperature changes recently.  She has tried cetirizine, fexofenadine, and loratadine without perceived benefit.  She is currently using nasal saline lavage as well as fluticasone nasal spray without adequate symptom relief.  Regarding her asthma, she rarely requires albuterol rescue and has not been experiencing limitations in her daily activities or nocturnal awakenings due to lower respiratory symptoms.   Assessment and plan: Mild intermittent asthma  Continue albuterol HFA, 1-2 inhalations every 4-6 hours as needed.  During respiratory tract infections or asthma flares, add Flovent 110g 2 inhalations 2 times per day until symptoms have returned to baseline. To maximize pulmonary deposition, a spacer has been provided along with instructions for its proper administration with an HFA inhaler.  The patient has been asked to contact me if her symptoms persist or progress. Otherwise, she may return for follow up in 4 months.  Chronic rhinitis Currently with suboptimal control.  Discontinue fexofenadine (Allegra).  A prescription has been provided for carbinoxamine 4 mg, 1 tablet every 8 hours if needed.  A prescription has been provided for ipratropium 0.06% nasal spray, 2 sprays per nostril every 8 hours if needed.  Nasal saline lavage (NeilMed) has been recommended as needed and prior to medicated nasal sprays along with instructions for proper  administration.   Meds ordered this encounter  Medications  . Carbinoxamine Maleate 4 MG TABS    Sig: Take 1 tablet (4 mg total) by mouth daily as needed.    Dispense:  56 each    Refill:  5  . ipratropium (ATROVENT) 0.06 % nasal spray    Sig: Place 2 sprays into both nostrils 3 (three) times daily.    Dispense:  15 mL    Refill:  5    Diagnostics: Spirometry reveals an FVC of 2.31 L (86% predicted) and an FEV1 of 1.65 L (79% predicted) with an FEV1 ratio of 93%.  The FEV1 is improved compared with previous study.  Please see scanned spirometry results for details.    Physical examination: Blood pressure 132/76, pulse 78, resp. rate 18, SpO2 92 %.  General: Alert, interactive, in no acute distress. HEENT: TMs pearly gray, turbinates moderately edematous with clear discharge, post-pharynx erythematous. Neck: Supple without lymphadenopathy. Lungs: Clear to auscultation without wheezing, rhonchi or rales. CV: Normal S1, S2 without murmurs. Skin: Warm and dry, without lesions or rashes.  The following portions of the patient's history were reviewed and updated as appropriate: allergies, current medications, past family history, past medical history, past social history, past surgical history and problem list.  Allergies as of 07/09/2017      Reactions   Codeine Other (See Comments)   Hallucinations   Penicillins Hives      Medication List        Accurate as of 07/09/17  7:18 PM. Always use your most recent med list.          allopurinol 300 MG tablet Commonly known as:  ZYLOPRIM TAKE 1 TABLET  BY MOUTH DAILY   AZOR 5-40 MG tablet Generic drug:  amLODipine-olmesartan Take 1 tablet by mouth daily.   Carbinoxamine Maleate 4 MG Tabs Take 1 tablet (4 mg total) by mouth daily as needed.   fluticasone 110 MCG/ACT inhaler Commonly known as:  FLOVENT HFA Inhale 2 puffs into the lungs 2 (two) times daily.   fluticasone 50 MCG/ACT nasal spray Commonly known as:   FLONASE PLACE 2 SPRAYS INTO BOTH NOSTRILS DAILY.   furosemide 20 MG tablet Commonly known as:  LASIX Take 20 mg by mouth daily.   ipratropium 0.06 % nasal spray Commonly known as:  ATROVENT Place 2 sprays into both nostrils 3 (three) times daily.   LOVAZA 1 g capsule Generic drug:  omega-3 acid ethyl esters Take 2 g by mouth 2 (two) times daily. Reported on 10/20/2015   Melatonin 3 MG Tabs Take 3 mg by mouth as needed.   metoprolol succinate 50 MG 24 hr tablet Commonly known as:  TOPROL-XL Take 50 mg by mouth daily. Take with or immediately following a meal.   MULTIVITAMIN+ Liqd Take by mouth.   potassium chloride 10 MEQ tablet Commonly known as:  K-DUR Take 10 mEq by mouth 1 day or 1 dose.   triamcinolone cream 0.1 % Commonly known as:  KENALOG APPLY TO AFFECTED AREA TWICE A DAY FOR 7 DAYS   VENTOLIN HFA 108 (90 Base) MCG/ACT inhaler Generic drug:  albuterol Inhale into the lungs every 6 (six) hours as needed for wheezing or shortness of breath.   VICTOZA 18 MG/3ML Sopn Generic drug:  liraglutide Inject into the skin.   Vitamin D3 3000 units Tabs Take by mouth.   Vitamin-B Complex Tabs Take by mouth.       Allergies  Allergen Reactions  . Codeine Other (See Comments)    Hallucinations  . Penicillins Hives   Review of systems: Review of systems negative except as noted in HPI / PMHx or noted below: Constitutional: Negative.  HENT: Negative.   Eyes: Negative.  Respiratory: Negative.   Cardiovascular: Negative.  Gastrointestinal: Negative.  Genitourinary: Negative.  Musculoskeletal: Negative.  Neurological: Negative.  Endo/Heme/Allergies: Negative.  Cutaneous: Negative.  Past Medical History:  Diagnosis Date  . Arthritis   . Diabetes mellitus without complication ([place])   . Gout   . Hypertension     Family History  Problem Relation Age of Onset  . Heart disease Mother   . Arthritis Father   . Hypertension Father   . Prostate cancer Father    . Parkinson's disease Father   . Sinusitis Sister   . Allergic rhinitis Sister   . Angioedema Neg Hx   . Asthma Neg Hx   . Eczema Neg Hx   . Immunodeficiency Neg Hx   . Urticaria Neg Hx     Social History   Socioeconomic History  . Marital status: Unknown    Spouse name: Not on file  . Number of children: Not on file  . Years of education: Not on file  . Highest education level: Not on file  Social Needs  . Financial resource strain: Not on file  . Food insecurity - worry: Not on file  . Food insecurity - inability: Not on file  . Transportation needs - medical: Not on file  . Transportation needs - non-medical: Not on file  Occupational History  . Not on file  Tobacco Use  . Smoking status: Never Smoker  . Smokeless tobacco: Never Used  Substance and Sexual Activity  .  Alcohol use: Yes    Comment: 6 YEARLY  . Drug use: No  . Sexual activity: Not Currently    Birth control/protection: None    Comment: Hysterectomy  Other Topics Concern  . Not on file  Social History Narrative  . Not on file    I appreciate the opportunity to take part in Archer's care. Please do not hesitate to contact me with questions.  Sincerely,   R. Edgar Frisk, MD

## 2017-07-09 NOTE — Patient Instructions (Addendum)
Mild intermittent asthma  Continue albuterol HFA, 1-2 inhalations every 4-6 hours as needed.  During respiratory tract infections or asthma flares, add Flovent 110g 2 inhalations 2 times per day until symptoms have returned to baseline. To maximize pulmonary deposition, a spacer has been provided along with instructions for its proper administration with an HFA inhaler.  The patient has been asked to contact me if her symptoms persist or progress. Otherwise, she may return for follow up in 4 months.  Chronic rhinitis Currently with suboptimal control.  Discontinue fexofenadine (Allegra).  A prescription has been provided for carbinoxamine 4 mg, 1 tablet every 8 hours if needed.  A prescription has been provided for ipratropium 0.06% nasal spray, 2 sprays per nostril every 8 hours if needed.  Nasal saline lavage (NeilMed) has been recommended as needed and prior to medicated nasal sprays along with instructions for proper administration.   Return in about 6 months (around 01/09/2018), or if symptoms worsen or fail to improve.

## 2017-07-20 DIAGNOSIS — G5601 Carpal tunnel syndrome, right upper limb: Secondary | ICD-10-CM | POA: Diagnosis not present

## 2017-08-03 DIAGNOSIS — M25631 Stiffness of right wrist, not elsewhere classified: Secondary | ICD-10-CM | POA: Diagnosis not present

## 2017-08-14 DIAGNOSIS — H524 Presbyopia: Secondary | ICD-10-CM | POA: Diagnosis not present

## 2017-08-14 DIAGNOSIS — H35352 Cystoid macular degeneration, left eye: Secondary | ICD-10-CM | POA: Diagnosis not present

## 2017-08-15 NOTE — Progress Notes (Signed)
Office Visit Note  Patient: Robin Odom             Date of Birth: November 27, 1950           MRN: [id number]             PCP: Glendale Chard, MD Referring: Glendale Chard, MD Visit Date: 08/28/2017 Occupation: @GUAROCC @    Subjective:  Left knee pain    History of Present Illness: Robin Odom is a 67 y.o. female with history of gout, osteoporosis, and DDD.  She takes allopurinol 300 mg daily.  She takes colchicine 0.6 mg as needed for flare.  She denies any recent flares of gout.  She does not need any refills of allopurinol or colchicine at this time.  She states that she has been exercising more the past few days is been going to water aerobics.  Says she states that she has some discomfort in her left knee and bilateral ankles.  She denies any other joint pain or joint swelling at this time.  She states that she also has increased edema in bilateral ankles.  She denies any pain in her neck at this time.  She states that her symptoms of carpal tunnel have resolved.  She states that she had a right carpal tunnel release which resolved her symptoms.   Activities of Daily Living:  Patient reports morning stiffness for 2 minutes.   Patient Denies nocturnal pain.  Difficulty dressing/grooming: Denies Difficulty climbing stairs: Denies Difficulty getting out of chair: Denies Difficulty using hands for taps, buttons, cutlery, and/or writing: Denies   Review of Systems  Constitutional: Negative for fatigue.  HENT: Negative for mouth sores, mouth dryness and nose dryness.   Eyes: Negative for pain, visual disturbance and dryness.  Respiratory: Negative for cough, hemoptysis, shortness of breath and difficulty breathing.   Cardiovascular: Positive for swelling in legs/feet. Negative for chest pain, palpitations and hypertension.  Gastrointestinal: Negative for blood in stool, constipation and diarrhea.  Endocrine: Negative for increased urination.  Genitourinary: Negative for painful urination.    Musculoskeletal: Positive for arthralgias and joint pain. Negative for joint swelling, myalgias, muscle weakness, morning stiffness, muscle tenderness and myalgias.  Skin: Negative for color change, pallor, rash, hair loss, nodules/bumps, skin tightness, ulcers and sensitivity to sunlight.  Allergic/Immunologic: Negative for susceptible to infections.  Neurological: Negative for dizziness, numbness, headaches and weakness.  Hematological: Negative for swollen glands.  Psychiatric/Behavioral: Negative for depressed mood and sleep disturbance. The patient is not nervous/anxious.     PMFS History:  Patient Active Problem List   Diagnosis Date Noted  . 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 tophus 07/20/2016  . Primary osteoarthritis of both hands 07/20/2016  . Primary osteoarthritis of both knees 07/20/2016  . Primary osteoarthritis of both feet 07/20/2016  . DJD (degenerative joint disease), cervical 07/20/2016  . Elevated CK 07/20/2016  . History of humerus fracture 07/20/2016  . History of hypertension 07/20/2016  . History of diabetes mellitus 07/20/2016  . History of hyperlipidemia 07/20/2016  . Chronic rhinitis 10/20/2015  . Anosmia 10/20/2015  . Mild intermittent asthma 10/20/2015    Past Medical History:  Diagnosis Date  . Arthritis   . Diabetes mellitus without complication ([place])   . Gout   . Hypertension     Family History  Problem Relation Age of Onset  . Heart disease Mother   . Arthritis Father   . Hypertension Father   . Prostate cancer Father   . Parkinson's disease Father   .  Sinusitis Sister   . Allergic rhinitis Sister   . Diabetes Sister   . High Cholesterol Sister   . Diabetes Brother   . Hypertension Brother   . High Cholesterol Brother   . Angioedema Neg Hx   . Asthma Neg Hx   . Eczema Neg Hx   . Immunodeficiency Neg Hx   . Urticaria Neg Hx    Past Surgical History:  Procedure Laterality Date  . ABDOMINAL HYSTERECTOMY  1991  .  CARPAL TUNNEL RELEASE Right 2019  . COLONOSCOPY     5 between 1994-2010  . Left knee surgery  2006  . ROTATOR CUFF REPAIR Left 2001  . ROTATOR CUFF REPAIR Right 2003  . SHOULDER SURGERY  2001/2003  . TONSILECTOMY/ADENOIDECTOMY WITH MYRINGOTOMY  1971   Social History   Social History Narrative  . Not on file     Objective: Vital Signs: BP 136/90 (BP Location: Left Arm, Patient Position: Sitting, Cuff Size: Large)   Pulse 73   Resp 16   Ht 5' 5.5" (1.664 m)   Wt 234 lb (106.1 kg)   BMI 38.35 kg/m    Physical Exam  Constitutional: She is oriented to person, place, and time. She appears well-developed and well-nourished.  HENT:  Head: Normocephalic and atraumatic.  Eyes: Conjunctivae and EOM are normal.  Neck: Normal range of motion.  Cardiovascular: Normal rate, regular rhythm, normal heart sounds and intact distal pulses.  Pulmonary/Chest: Effort normal and breath sounds normal.  Abdominal: Soft. Bowel sounds are normal.  Lymphadenopathy:    She has no cervical adenopathy.  Neurological: She is alert and oriented to person, place, and time.  Skin: Skin is warm and dry. Capillary refill takes less than 2 seconds.  Psychiatric: She has a normal mood and affect. Her behavior is normal.  Nursing note and vitals reviewed.    Musculoskeletal Exam: C-spine, thoracic spine, lumbar spine good range of motion.  No midline spinal tenderness.  No SI joint tenderness.  Shoulder joints, elbow joints, wrist joints, MCPs, PIPs, DIPs good range of motion with no synovitis.  She has mild PIP and DIP synovial thickening consistent with also arthritis.  Hip joints, knee joints, ankle joints, MTPs, PIPs, DIPs good range of motion with no synovitis.  No warmth or effusion of bilateral knees.  No tenderness of trochanteric bursa bilaterally.  Pedal edema bilaterally.  CDAI Exam: No CDAI exam completed.    Investigation: No additional findings.Uric acid: 02/20/2017 4.8 CBC Latest Ref Rng &  Units 02/20/2017 08/21/2016  WBC 3.8 - 10.8 Thousand/uL 10.6 10.9(H)  Hemoglobin 11.7 - 15.5 g/dL 13.4 12.8  Hematocrit 35.0 - 45.0 % 41.1 40.5  Platelets 140 - 400 Thousand/uL 344 343   CMP Latest Ref Rng & Units 02/20/2017 08/21/2016  Glucose 65 - 99 mg/dL 81 98  BUN 7 - 25 mg/dL 14 15  Creatinine 0.50 - 0.99 mg/dL 0.67 0.82  Sodium 135 - 146 mmol/L 141 143  Potassium 3.5 - 5.3 mmol/L 3.7 4.2  Chloride 98 - 110 mmol/L 105 107  CO2 20 - 32 mmol/L 28 27  Calcium 8.6 - 10.4 mg/dL 9.1 9.7  Total Protein 6.1 - 8.1 g/dL 6.9 7.1  Total Bilirubin 0.2 - 1.2 mg/dL 0.4 0.4  Alkaline Phos 33 - 130 U/L - 59  AST 10 - 35 U/L 20 23  ALT 6 - 29 U/L 16 17    Imaging: No results found.  Speciality Comments: No specialty comments available.    Procedures:  No procedures performed Allergies: Codeine; Hydrocodone; and Penicillins   Assessment / Plan:     Visit Diagnoses: 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 tophus -She has not had any recent gout flares.  She has no joint swelling, warmth, or erythema on exam. She continues to take Allopurinol 300 mg po qd and colchicine 0.6 mg po qd PRN during a flare.  Her most recent uric acid on 02/20/2017 was 4.8.  We will recheck uric acid level today.  She was advised to continue to take allopurinol 300 mg by mouth daily.  If her uric acid level is between 2 to 3 weeks would consider lowering her dose in the future.- Plan: Uric acid  Medication monitoring encounter -CBC and CMP were ordered today.  Plan: CBC with Differential/Platelet, COMPLETE METABOLIC PANEL WITH GFR   Primary osteoarthritis of both hands: She is PIP and DIP synovial thickening consistent with osteoarthritis.  No synovitis was noted.  She has no discomfort in her hands at this time.  Joint protection muscle strengthening were discussed.  Primary osteoarthritis of both knees: No warmth or effusion on exam.  She is been experiencing some discomfort in her left knee joint.  She  has been exercising consistently on a regular basis has been going to water aerobics.  She feels that she has overdid it exercising and is going to rest.  She is advised to ice and elevate her knee joint.  Primary osteoarthritis of both feet: She has no synovitis on exam.  She is no discomfort in her feet at this time.  She has pedal edema and bilateral ankles.  DDD (degenerative disc disease), cervical: She has good range of motion with no discomfort on exam.  Bilateral carpal tunnel syndrome - S/p right carpal tunnel release.  Her carpal tunnel symptoms have resolved bilaterally.  Other medical conditions are listed as follows:  History of hypertension  History of diabetes mellitus  History of hyperlipidemia  History of humerus fracture     Orders: Orders Placed This Encounter  Procedures  . CBC with Differential/Platelet  . COMPLETE METABOLIC PANEL WITH GFR  . Uric acid   No orders of the defined types were placed in this encounter.   Follow-Up Instructions: Return in about 6 months (around 02/27/2018) for Gout, Osteoarthritis, DDD.   Ofilia Neas, PA-C   I examined and evaluated the patient with Hazel Sams PA. The plan of care was discussed as noted above.  Bo Merino, MD  Note - This record has been created using Editor, commissioning.  Chart creation errors have been sought, but may not always  have been located. Such creation errors do not reflect on  the standard of medical care.

## 2017-08-20 DIAGNOSIS — M25631 Stiffness of right wrist, not elsewhere classified: Secondary | ICD-10-CM | POA: Diagnosis not present

## 2017-08-28 ENCOUNTER — Encounter: Payer: Self-pay | Admitting: Rheumatology

## 2017-08-28 ENCOUNTER — Ambulatory Visit (INDEPENDENT_AMBULATORY_CARE_PROVIDER_SITE_OTHER): Payer: Medicare Other | Admitting: Rheumatology

## 2017-08-28 VITALS — BP 136/90 | HR 73 | Resp 16 | Ht 65.5 in | Wt 234.0 lb

## 2017-08-28 DIAGNOSIS — M19072 Primary osteoarthritis, left ankle and foot: Secondary | ICD-10-CM | POA: Diagnosis not present

## 2017-08-28 DIAGNOSIS — Z8639 Personal history of other endocrine, nutritional and metabolic disease: Secondary | ICD-10-CM

## 2017-08-28 DIAGNOSIS — M19041 Primary osteoarthritis, right hand: Secondary | ICD-10-CM

## 2017-08-28 DIAGNOSIS — Z5181 Encounter for therapeutic drug level monitoring: Secondary | ICD-10-CM | POA: Diagnosis not present

## 2017-08-28 DIAGNOSIS — M19071 Primary osteoarthritis, right ankle and foot: Secondary | ICD-10-CM

## 2017-08-28 DIAGNOSIS — M1A09X0 Idiopathic chronic gout, multiple sites, without tophus (tophi): Secondary | ICD-10-CM | POA: Diagnosis not present

## 2017-08-28 DIAGNOSIS — M19042 Primary osteoarthritis, left hand: Secondary | ICD-10-CM

## 2017-08-28 DIAGNOSIS — Z8679 Personal history of other diseases of the circulatory system: Secondary | ICD-10-CM | POA: Diagnosis not present

## 2017-08-28 DIAGNOSIS — M5030 Other cervical disc degeneration, unspecified cervical region: Secondary | ICD-10-CM | POA: Diagnosis not present

## 2017-08-28 DIAGNOSIS — G5603 Carpal tunnel syndrome, bilateral upper limbs: Secondary | ICD-10-CM | POA: Diagnosis not present

## 2017-08-28 DIAGNOSIS — Z8781 Personal history of (healed) traumatic fracture: Secondary | ICD-10-CM | POA: Diagnosis not present

## 2017-08-28 DIAGNOSIS — M170 Bilateral primary osteoarthritis of knee: Secondary | ICD-10-CM

## 2017-08-28 LAB — CBC WITH DIFFERENTIAL/PLATELET
Basophils Absolute: 55 cells/uL (ref 0–200)
Basophils Relative: 0.5 %
Eosinophils Absolute: 165 cells/uL (ref 15–500)
Eosinophils Relative: 1.5 %
HCT: 38.4 % (ref 35.0–45.0)
Hemoglobin: 12.9 g/dL (ref 11.7–15.5)
Lymphs Abs: 2937 cells/uL (ref 850–3900)
MCH: 26.5 pg — ABNORMAL LOW (ref 27.0–33.0)
MCHC: 33.6 g/dL (ref 32.0–36.0)
MCV: 79 fL — ABNORMAL LOW (ref 80.0–100.0)
MPV: 10.3 fL (ref 7.5–12.5)
Monocytes Relative: 8.8 %
Neutro Abs: 6875 cells/uL (ref 1500–7800)
Neutrophils Relative %: 62.5 %
Platelets: 322 10*3/uL (ref 140–400)
RBC: 4.86 10*6/uL (ref 3.80–5.10)
RDW: 15.7 % — ABNORMAL HIGH (ref 11.0–15.0)
Total Lymphocyte: 26.7 %
WBC mixed population: 968 cells/uL — ABNORMAL HIGH (ref 200–950)
WBC: 11 10*3/uL — ABNORMAL HIGH (ref 3.8–10.8)

## 2017-08-29 DIAGNOSIS — H33313 Horseshoe tear of retina without detachment, bilateral: Secondary | ICD-10-CM

## 2017-08-29 HISTORY — DX: Horseshoe tear of retina without detachment, bilateral: H33.313

## 2017-08-29 LAB — COMPLETE METABOLIC PANEL WITH GFR
AG Ratio: 1.6 (calc) (ref 1.0–2.5)
ALT: 18 U/L (ref 6–29)
AST: 20 U/L (ref 10–35)
Albumin: 4.1 g/dL (ref 3.6–5.1)
Alkaline phosphatase (APISO): 69 U/L (ref 33–130)
BUN: 15 mg/dL (ref 7–25)
CO2: 31 mmol/L (ref 20–32)
Calcium: 9.6 mg/dL (ref 8.6–10.4)
Chloride: 105 mmol/L (ref 98–110)
Creat: 0.7 mg/dL (ref 0.50–0.99)
GFR, Est African American: 104 mL/min/{1.73_m2} (ref >=60)
GFR, Est Non African American: 90 mL/min/{1.73_m2} (ref >=60)
Globulin: 2.6 g/dL (calc) (ref 1.9–3.7)
Glucose, Bld: 87 mg/dL (ref 65–99)
Potassium: 3.9 mmol/L (ref 3.5–5.3)
Sodium: 143 mmol/L (ref 135–146)
Total Bilirubin: 0.4 mg/dL (ref 0.2–1.2)
Total Protein: 6.7 g/dL (ref 6.1–8.1)

## 2017-08-29 LAB — URIC ACID: Uric Acid, Serum: 4.7 mg/dL (ref 2.5–7.0)

## 2017-08-29 NOTE — Progress Notes (Signed)
CBC WNL. Uric acid is in desirable range. CMP stable.

## 2017-08-30 DIAGNOSIS — H34832 Tributary (branch) retinal vein occlusion, left eye, with macular edema: Secondary | ICD-10-CM | POA: Diagnosis not present

## 2017-08-30 DIAGNOSIS — H35042 Retinal micro-aneurysms, unspecified, left eye: Secondary | ICD-10-CM | POA: Diagnosis not present

## 2017-08-30 DIAGNOSIS — H43813 Vitreous degeneration, bilateral: Secondary | ICD-10-CM | POA: Diagnosis not present

## 2017-08-30 DIAGNOSIS — H35031 Hypertensive retinopathy, right eye: Secondary | ICD-10-CM | POA: Diagnosis not present

## 2017-09-04 ENCOUNTER — Other Ambulatory Visit: Payer: Self-pay | Admitting: Rheumatology

## 2017-09-04 NOTE — Telephone Encounter (Signed)
Last visit: 08/28/17 Next Visit: 02/27/18 Labs: 08/28/17  CBC WNL. Uric acid is in desirable range. CMP stable.      Okay to refill per Dr. Estanislado Pandy

## 2017-09-13 DIAGNOSIS — L821 Other seborrheic keratosis: Secondary | ICD-10-CM | POA: Diagnosis not present

## 2017-09-27 DIAGNOSIS — H43813 Vitreous degeneration, bilateral: Secondary | ICD-10-CM | POA: Diagnosis not present

## 2017-09-27 DIAGNOSIS — H35042 Retinal micro-aneurysms, unspecified, left eye: Secondary | ICD-10-CM | POA: Diagnosis not present

## 2017-09-27 DIAGNOSIS — H34832 Tributary (branch) retinal vein occlusion, left eye, with macular edema: Secondary | ICD-10-CM | POA: Diagnosis not present

## 2017-09-27 DIAGNOSIS — H35031 Hypertensive retinopathy, right eye: Secondary | ICD-10-CM | POA: Diagnosis not present

## 2017-10-15 DIAGNOSIS — M25562 Pain in left knee: Secondary | ICD-10-CM | POA: Diagnosis not present

## 2017-10-15 DIAGNOSIS — E669 Obesity, unspecified: Secondary | ICD-10-CM | POA: Diagnosis not present

## 2017-10-15 DIAGNOSIS — I10 Essential (primary) hypertension: Secondary | ICD-10-CM | POA: Diagnosis not present

## 2017-10-15 DIAGNOSIS — R7309 Other abnormal glucose: Secondary | ICD-10-CM | POA: Diagnosis not present

## 2017-10-15 DIAGNOSIS — Z1389 Encounter for screening for other disorder: Secondary | ICD-10-CM | POA: Diagnosis not present

## 2017-10-18 DIAGNOSIS — H34832 Tributary (branch) retinal vein occlusion, left eye, with macular edema: Secondary | ICD-10-CM | POA: Diagnosis not present

## 2017-11-15 DIAGNOSIS — H34832 Tributary (branch) retinal vein occlusion, left eye, with macular edema: Secondary | ICD-10-CM | POA: Diagnosis not present

## 2017-11-15 DIAGNOSIS — H43813 Vitreous degeneration, bilateral: Secondary | ICD-10-CM | POA: Diagnosis not present

## 2017-11-15 DIAGNOSIS — H35031 Hypertensive retinopathy, right eye: Secondary | ICD-10-CM | POA: Diagnosis not present

## 2017-11-30 ENCOUNTER — Encounter: Payer: Self-pay | Admitting: Internal Medicine

## 2017-12-17 ENCOUNTER — Other Ambulatory Visit: Payer: Self-pay | Admitting: Internal Medicine

## 2017-12-17 DIAGNOSIS — Z1231 Encounter for screening mammogram for malignant neoplasm of breast: Secondary | ICD-10-CM

## 2017-12-20 DIAGNOSIS — Z0130 Encounter for examination of blood pressure without abnormal findings: Secondary | ICD-10-CM | POA: Diagnosis not present

## 2017-12-20 DIAGNOSIS — E119 Type 2 diabetes mellitus without complications: Secondary | ICD-10-CM | POA: Diagnosis not present

## 2017-12-20 DIAGNOSIS — H66002 Acute suppurative otitis media without spontaneous rupture of ear drum, left ear: Secondary | ICD-10-CM | POA: Diagnosis not present

## 2017-12-24 DIAGNOSIS — H6121 Impacted cerumen, right ear: Secondary | ICD-10-CM | POA: Diagnosis not present

## 2017-12-24 DIAGNOSIS — Z0130 Encounter for examination of blood pressure without abnormal findings: Secondary | ICD-10-CM | POA: Diagnosis not present

## 2017-12-24 DIAGNOSIS — E118 Type 2 diabetes mellitus with unspecified complications: Secondary | ICD-10-CM | POA: Diagnosis not present

## 2017-12-27 DIAGNOSIS — H43813 Vitreous degeneration, bilateral: Secondary | ICD-10-CM | POA: Diagnosis not present

## 2017-12-27 DIAGNOSIS — H35042 Retinal micro-aneurysms, unspecified, left eye: Secondary | ICD-10-CM | POA: Diagnosis not present

## 2017-12-27 DIAGNOSIS — H35031 Hypertensive retinopathy, right eye: Secondary | ICD-10-CM | POA: Diagnosis not present

## 2017-12-27 DIAGNOSIS — H34832 Tributary (branch) retinal vein occlusion, left eye, with macular edema: Secondary | ICD-10-CM | POA: Diagnosis not present

## 2017-12-28 DIAGNOSIS — H6121 Impacted cerumen, right ear: Secondary | ICD-10-CM | POA: Diagnosis not present

## 2018-01-15 ENCOUNTER — Ambulatory Visit
Admission: RE | Admit: 2018-01-15 | Discharge: 2018-01-15 | Disposition: A | Discharge status: Home / Self Care | Payer: Medicare Other | Source: Ambulatory Visit | Attending: Internal Medicine | Admitting: Internal Medicine

## 2018-01-15 DIAGNOSIS — Z1231 Encounter for screening mammogram for malignant neoplasm of breast: Secondary | ICD-10-CM | POA: Diagnosis not present

## 2018-02-12 DIAGNOSIS — Z23 Encounter for immunization: Secondary | ICD-10-CM | POA: Diagnosis not present

## 2018-02-13 NOTE — Progress Notes (Signed)
Office Visit Note  Patient: Robin Odom             Date of Birth: 1950/09/14           MRN: [id number]             PCP: Glendale Chard, MD Referring: Glendale Chard, MD Visit Date: 02/27/2018 Occupation: @GUAROCC @  Subjective:  Medication monitoring   History of Present Illness: Robin Odom is a 67 y.o. female with history of gout and osteoarthritis.  She denies any joint pain or joint swelling at this time.  She states depending on weather changes and her activity level she gets more achy.  She denies any recent gout flares.  She is taking Allopurinol 300 mg by mouth daily. She has not needed to take colchicine in several years.  She reports she eats red meat once every 1-2 months. She denies needing any refills at this time. She tries to get up every 30 minutes throughout the day to prevent worsening stiffness. She has been taking tumeric and omega 3 daily, and she has noticed significant benefit.     Activities of Daily Living:  Patient reports morning stiffness for 2  minutes.   Patient Denies nocturnal pain.  Difficulty dressing/grooming: Denies Difficulty climbing stairs: Denies Difficulty getting out of chair: Denies Difficulty using hands for taps, buttons, cutlery, and/or writing: Denies  Review of Systems  Constitutional: Negative for fatigue.  HENT: Negative for mouth sores, mouth dryness and nose dryness.   Eyes: Positive for dryness. Negative for pain and visual disturbance.  Respiratory: Negative for cough, hemoptysis, shortness of breath and difficulty breathing.   Cardiovascular: Negative for chest pain, palpitations, hypertension and swelling in legs/feet.  Gastrointestinal: Negative for blood in stool, constipation and diarrhea.  Endocrine: Negative for increased urination.  Genitourinary: Negative for painful urination.  Musculoskeletal: Positive for morning stiffness. Negative for arthralgias, joint pain, joint swelling, myalgias, muscle weakness, muscle tenderness  and myalgias.  Skin: Negative for color change, pallor, rash, hair loss, nodules/bumps, skin tightness, ulcers and sensitivity to sunlight.  Allergic/Immunologic: Negative for susceptible to infections.  Neurological: Negative for dizziness, numbness, headaches and weakness.  Hematological: Negative for swollen glands.  Psychiatric/Behavioral: Negative for depressed mood and sleep disturbance. The patient is not nervous/anxious.     PMFS History:  Patient Active Problem List   Diagnosis Date Noted  . 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 tophus 07/20/2016  . Primary osteoarthritis of both hands 07/20/2016  . Primary osteoarthritis of both knees 07/20/2016  . Primary osteoarthritis of both feet 07/20/2016  . DJD (degenerative joint disease), cervical 07/20/2016  . Elevated CK 07/20/2016  . History of humerus fracture 07/20/2016  . History of hypertension 07/20/2016  . History of diabetes mellitus 07/20/2016  . History of hyperlipidemia 07/20/2016  . Chronic rhinitis 10/20/2015  . Anosmia 10/20/2015  . Mild intermittent asthma 10/20/2015    Past Medical History:  Diagnosis Date  . Arthritis   . Diabetes mellitus without complication ([place])   . Gout   . Hypertension     Family History  Problem Relation Age of Onset  . Heart disease Mother   . Arthritis Father   . Hypertension Father   . Prostate cancer Father   . Parkinson's disease Father   . Sinusitis Sister   . Allergic rhinitis Sister   . Diabetes Sister   . High Cholesterol Sister   . Diabetes Brother   . Hypertension Brother   . High Cholesterol Brother   .  Angioedema Neg Hx   . Asthma Neg Hx   . Eczema Neg Hx   . Immunodeficiency Neg Hx   . Urticaria Neg Hx   . Breast cancer Neg Hx    Past Surgical History:  Procedure Laterality Date  . ABDOMINAL HYSTERECTOMY  1991  . CARPAL TUNNEL RELEASE Right 2019  . COLONOSCOPY     5 between 1994-2010  . Left knee surgery  2006  . ROTATOR CUFF REPAIR Left  2001  . ROTATOR CUFF REPAIR Right 2003  . SHOULDER SURGERY  2001/2003  . TONSILECTOMY/ADENOIDECTOMY WITH MYRINGOTOMY  1971   Social History   Social History Narrative  . Not on file    Objective: Vital Signs: BP 140/88 (BP Location: Left Arm, Patient Position: Sitting, Cuff Size: Large)   Pulse 75   Resp 14   Ht 5' 5.5" (1.664 m)   Wt 238 lb 6.4 oz (108.1 kg)   BMI 39.07 kg/m    Physical Exam  Constitutional: She is oriented to person, place, and time. She appears well-developed and well-nourished.  HENT:  Head: Normocephalic and atraumatic.  Eyes: Conjunctivae and EOM are normal.  Neck: Normal range of motion.  Cardiovascular: Normal rate, regular rhythm, normal heart sounds and intact distal pulses.  Pulmonary/Chest: Effort normal and breath sounds normal.  Abdominal: Soft. Bowel sounds are normal.  Lymphadenopathy:    She has no cervical adenopathy.  Neurological: She is alert and oriented to person, place, and time.  Skin: Skin is warm and dry. Capillary refill takes less than 2 seconds.  Psychiatric: She has a normal mood and affect. Her behavior is normal.  Nursing note and vitals reviewed.    Musculoskeletal Exam: C-spine, thoracic spine, and lumbar spine good ROM.  Shoulder joints, elbow joints, wrist joints, MCPs, PIPs, and DIPs good ROM with no synovitis.  PIP and DIP synovial thickening.  Hip joints, knee joints, ankle joints, MTPs, PIPs, and DIPs good ROM with no synovitis.  No warmth or effusion of knee joints. Pedal edema bilaterally.  Hammertoes in right foot.  bilateral 1st MTP joint synovial thickening.   CDAI Exam: CDAI Score: Not documented Patient Global Assessment: Not documented; Provider Global Assessment: Not documented Swollen: Not documented; Tender: Not documented Joint Exam   Not documented   There is currently no information documented on the homunculus. Go to the Rheumatology activity and complete the homunculus joint  exam.  Investigation: No additional findings.  Imaging: No results found.  Recent Labs: Lab Results  Component Value Date   WBC 11.0 (H) 08/28/2017   HGB 12.9 08/28/2017   PLT 322 08/28/2017   NA 143 08/28/2017   K 3.9 08/28/2017   CL 105 08/28/2017   CO2 31 08/28/2017   GLUCOSE 87 08/28/2017   BUN 15 08/28/2017   CREATININE 0.70 08/28/2017   BILITOT 0.4 08/28/2017   ALKPHOS 59 08/21/2016   AST 20 08/28/2017   ALT 18 08/28/2017   PROT 6.7 08/28/2017   ALBUMIN 4.1 08/21/2016   CALCIUM 9.6 08/28/2017   GFRAA 104 08/28/2017   Lab Results  Component Value Date   LABURIC 4.7 08/28/2017   Speciality Comments: No specialty comments available.  Procedures:  No procedures performed Allergies: Codeine; Hydrocodone; and Penicillins   Assessment / Plan:     Visit Diagnoses: 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 tophus - She has not had any recent gout flares.  She is clinically doing well on allopurinol 300 mg by mouth daily.  She has not  needed to take colchicine in several years.  She denies needing any refills.  she reports she eats red meat once every 1-2 months.  We will check uric acid today.  Most recent uric acid: 08/28/2017 4.7. She was advised to notify us if she develops a gout flare.  She will follow-up in the office in 6 months.- Plan: Uric acid  Medication monitoring encounter -CBC and CMP will be drawn today to monitor for drug toxicity.  Plan: CBC with Differential/Platelet, COMPLETE METABOLIC PANEL WITH GFR   Primary osteoarthritis of both hands: She has PIP and DIP synovial thickening consistent with also arthritis of bilateral hands.  She has complete fist formation bilaterally.  No synovitis was noted.  Joint protection and muscle strengthening were discussed.  Primary osteoarthritis of both knees: No warmth or effusion.  Good range of motion with no discomfort.  Primary osteoarthritis of both feet: She has hammertoes as well as bilateral first MTP  synovial thickening.  She has no discomfort in her feet at this time.  DDD (degenerative disc disease), cervical: She is good range of motion with no discomfort.  No symptoms of radiculopathy at this time.  Bilateral carpal tunnel syndrome - S/p right carpal tunnel release: Asymptomatic at this time.  Other medical conditions are listed as follows:  History of humerus fracture  History of hyperlipidemia  History of hypertension  History of diabetes mellitus   Orders:  Orders Placed This Encounter  Procedures  . CBC with Differential/Platelet  . COMPLETE METABOLIC PANEL WITH GFR  . Uric acid   No orders of the defined types were placed in this encounter.   Follow-Up Instructions: Return in about 6 months (around 08/29/2018) for Gout, Osteoarthritis.   Ofilia Neas, PA-C  Note - This record has been created using Dragon software.  Chart creation errors have been sought, but may not always  have been located. Such creation errors do not reflect on  the standard of medical care.

## 2018-02-27 ENCOUNTER — Encounter: Payer: Self-pay | Admitting: Physician Assistant

## 2018-02-27 ENCOUNTER — Ambulatory Visit (INDEPENDENT_AMBULATORY_CARE_PROVIDER_SITE_OTHER): Payer: Medicare Other | Admitting: Physician Assistant

## 2018-02-27 VITALS — BP 140/88 | HR 75 | Resp 14 | Ht 65.5 in | Wt 238.4 lb

## 2018-02-27 DIAGNOSIS — M1A09X0 Idiopathic chronic gout, multiple sites, without tophus (tophi): Secondary | ICD-10-CM

## 2018-02-27 DIAGNOSIS — M19071 Primary osteoarthritis, right ankle and foot: Secondary | ICD-10-CM

## 2018-02-27 DIAGNOSIS — M170 Bilateral primary osteoarthritis of knee: Secondary | ICD-10-CM

## 2018-02-27 DIAGNOSIS — M19042 Primary osteoarthritis, left hand: Secondary | ICD-10-CM

## 2018-02-27 DIAGNOSIS — M19041 Primary osteoarthritis, right hand: Secondary | ICD-10-CM

## 2018-02-27 DIAGNOSIS — Z8639 Personal history of other endocrine, nutritional and metabolic disease: Secondary | ICD-10-CM

## 2018-02-27 DIAGNOSIS — Z8781 Personal history of (healed) traumatic fracture: Secondary | ICD-10-CM | POA: Diagnosis not present

## 2018-02-27 DIAGNOSIS — G5603 Carpal tunnel syndrome, bilateral upper limbs: Secondary | ICD-10-CM | POA: Diagnosis not present

## 2018-02-27 DIAGNOSIS — M19072 Primary osteoarthritis, left ankle and foot: Secondary | ICD-10-CM

## 2018-02-27 DIAGNOSIS — Z5181 Encounter for therapeutic drug level monitoring: Secondary | ICD-10-CM

## 2018-02-27 DIAGNOSIS — Z8679 Personal history of other diseases of the circulatory system: Secondary | ICD-10-CM

## 2018-02-27 DIAGNOSIS — M5030 Other cervical disc degeneration, unspecified cervical region: Secondary | ICD-10-CM | POA: Diagnosis not present

## 2018-02-28 LAB — CBC WITH DIFFERENTIAL/PLATELET
Basophils Absolute: 62 cells/uL (ref 0–200)
Basophils Relative: 0.7 %
Eosinophils Absolute: 231 cells/uL (ref 15–500)
Eosinophils Relative: 2.6 %
HCT: 38.3 % (ref 35.0–45.0)
Hemoglobin: 12.8 g/dL (ref 11.7–15.5)
Lymphs Abs: 3079 cells/uL (ref 850–3900)
MCH: 26.3 pg — ABNORMAL LOW (ref 27.0–33.0)
MCHC: 33.4 g/dL (ref 32.0–36.0)
MCV: 78.6 fL — ABNORMAL LOW (ref 80.0–100.0)
MPV: 10.4 fL (ref 7.5–12.5)
Monocytes Relative: 6.4 %
Neutro Abs: 4957 cells/uL (ref 1500–7800)
Neutrophils Relative %: 55.7 %
Platelets: 317 10*3/uL (ref 140–400)
RBC: 4.87 10*6/uL (ref 3.80–5.10)
RDW: 14.8 % (ref 11.0–15.0)
Total Lymphocyte: 34.6 %
WBC mixed population: 570 cells/uL (ref 200–950)
WBC: 8.9 10*3/uL (ref 3.8–10.8)

## 2018-02-28 LAB — COMPLETE METABOLIC PANEL WITH GFR
AG Ratio: 1.6 (calc) (ref 1.0–2.5)
ALT: 16 U/L (ref 6–29)
AST: 20 U/L (ref 10–35)
Albumin: 3.9 g/dL (ref 3.6–5.1)
Alkaline phosphatase (APISO): 70 U/L (ref 33–130)
BUN: 14 mg/dL (ref 7–25)
CO2: 30 mmol/L (ref 20–32)
Calcium: 9.5 mg/dL (ref 8.6–10.4)
Chloride: 107 mmol/L (ref 98–110)
Creat: 0.68 mg/dL (ref 0.50–0.99)
GFR, Est African American: 104 mL/min/{1.73_m2} (ref >=60)
GFR, Est Non African American: 90 mL/min/{1.73_m2} (ref >=60)
Globulin: 2.5 g/dL (calc) (ref 1.9–3.7)
Glucose, Bld: 111 mg/dL — ABNORMAL HIGH (ref 65–99)
Potassium: 3.8 mmol/L (ref 3.5–5.3)
Sodium: 142 mmol/L (ref 135–146)
Total Bilirubin: 0.4 mg/dL (ref 0.2–1.2)
Total Protein: 6.4 g/dL (ref 6.1–8.1)

## 2018-02-28 LAB — URIC ACID: Uric Acid, Serum: 4.1 mg/dL (ref 2.5–7.0)

## 2018-02-28 NOTE — Progress Notes (Signed)
CBC stable. Glucose is 111. Uric acid is within desirable range

## 2018-03-04 DIAGNOSIS — H43813 Vitreous degeneration, bilateral: Secondary | ICD-10-CM | POA: Diagnosis not present

## 2018-03-04 DIAGNOSIS — H34832 Tributary (branch) retinal vein occlusion, left eye, with macular edema: Secondary | ICD-10-CM | POA: Diagnosis not present

## 2018-03-04 DIAGNOSIS — H2513 Age-related nuclear cataract, bilateral: Secondary | ICD-10-CM | POA: Diagnosis not present

## 2018-03-04 DIAGNOSIS — H35033 Hypertensive retinopathy, bilateral: Secondary | ICD-10-CM | POA: Diagnosis not present

## 2018-03-06 ENCOUNTER — Telehealth: Payer: Self-pay

## 2018-03-13 ENCOUNTER — Ambulatory Visit: Payer: Medicare Other | Admitting: Internal Medicine

## 2018-03-13 ENCOUNTER — Ambulatory Visit: Payer: Medicare Other

## 2018-03-19 ENCOUNTER — Other Ambulatory Visit: Payer: Self-pay

## 2018-03-21 ENCOUNTER — Ambulatory Visit: Payer: Medicare Other | Admitting: Internal Medicine

## 2018-03-25 DIAGNOSIS — H35032 Hypertensive retinopathy, left eye: Secondary | ICD-10-CM | POA: Diagnosis not present

## 2018-03-25 DIAGNOSIS — H43813 Vitreous degeneration, bilateral: Secondary | ICD-10-CM | POA: Diagnosis not present

## 2018-03-25 DIAGNOSIS — H348322 Tributary (branch) retinal vein occlusion, left eye, stable: Secondary | ICD-10-CM | POA: Diagnosis not present

## 2018-04-04 ENCOUNTER — Ambulatory Visit (INDEPENDENT_AMBULATORY_CARE_PROVIDER_SITE_OTHER): Payer: Medicare Other

## 2018-04-04 ENCOUNTER — Encounter: Payer: Medicare Other | Admitting: Internal Medicine

## 2018-04-04 VITALS — BP 122/78 | HR 76 | Temp 98.0°F | Ht 64.5 in | Wt 235.0 lb

## 2018-04-04 DIAGNOSIS — Z0000 Encounter for general adult medical examination without abnormal findings: Secondary | ICD-10-CM | POA: Diagnosis not present

## 2018-04-04 NOTE — Progress Notes (Signed)
Subjective:   Sadiyah Kangas is a 67 y.o. female who presents for Medicare Annual (Subsequent) preventive examination.  Review of Systems:  n/a Cardiac Risk Factors include: advanced age (>11men, >40 women);obesity (BMI >30kg/m2);hypertension     Objective:     Vitals: BP 122/78 (BP Location: Left Arm, Patient Position: Sitting)   Pulse 76   Temp 98 F (36.7 C) (Oral)   Ht 5' 4.5" (1.638 m)   Wt 235 lb (106.6 kg)   SpO2 96%   BMI 39.71 kg/m   Body mass index is 39.71 kg/m.  Advanced Directives 04/04/2018  Does Patient Have a Medical Advance Directive? Yes  Type of Paramedic of [place];Living will  Does patient want to make changes to medical advance directive? No - Patient declined  Copy of [place] in Chart? No - copy requested    Tobacco Social History   Tobacco Use  Smoking Status Never Smoker  Smokeless Tobacco Never Used     Counseling given: Not Answered   Clinical Intake:  Pre-visit preparation completed: Yes  Pain : 0-10 Pain Score: 7  Pain Type: Chronic pain, Acute pain(chr- arthitis, acute- back ) Pain Location: Back(joint pains as well) Pain Orientation: Lower Pain Radiating Towards: nothing Pain Descriptors / Indicators: Other (Comment)(feels like a pulled muscle) Pain Onset: 1 to 4 weeks ago Pain Frequency: Intermittent(only with certain movements) Pain Relieving Factors: nothing Effect of Pain on Daily Activities: no effect  Pain Relieving Factors: nothing  Nutritional Status: BMI > 30  Obese Nutritional Risks: None Diabetes: No  How often do you need to have someone help you when you read instructions, pamphlets, or other written materials from your doctor or pharmacy?: 1 - Never What is the last grade level you completed in school?: [name]  Interpreter Needed?: No  Information entered by :: NAllen LPN  Past Medical History:  Diagnosis Date  . Arthritis   . Diabetes mellitus without  complication ([place])   . Gout   . Hypertension    Past Surgical History:  Procedure Laterality Date  . ABDOMINAL HYSTERECTOMY  1991  . CARPAL TUNNEL RELEASE Right 2019  . COLONOSCOPY     5 between 1994-2010  . Left knee surgery  2006  . ROTATOR CUFF REPAIR Left 2001  . ROTATOR CUFF REPAIR Right 2003  . SHOULDER SURGERY  2001/2003  . TONSILECTOMY/ADENOIDECTOMY WITH MYRINGOTOMY  1971   Family History  Problem Relation Age of Onset  . Heart disease Mother   . Arthritis Father   . Hypertension Father   . Prostate cancer Father   . Parkinson's disease Father   . Sinusitis Sister   . Allergic rhinitis Sister   . Diabetes Sister   . High Cholesterol Sister   . Diabetes Brother   . Hypertension Brother   . High Cholesterol Brother   . Angioedema Neg Hx   . Asthma Neg Hx   . Eczema Neg Hx   . Immunodeficiency Neg Hx   . Urticaria Neg Hx   . Breast cancer Neg Hx    Social History   Socioeconomic History  . Marital status: Single    Spouse name: Not on file  . Number of children: Not on file  . Years of education: Not on file  . Highest education level: Not on file  Occupational History  . Occupation: retired  Scientific laboratory technician  . Financial resource strain: Not hard at all  . Food insecurity:    Worry: Never  true    Inability: Never true  . Transportation needs:    Medical: No    Non-medical: No  Tobacco Use  . Smoking status: Never Smoker  . Smokeless tobacco: Never Used  Substance and Sexual Activity  . Alcohol use: Yes    Comment: rarely  . Drug use: No  . Sexual activity: Not Currently    Birth control/protection: None    Comment: Hysterectomy  Lifestyle  . Physical activity:    Days per week: 4 days    Minutes per session: 50 min  . Stress: Not at all  Relationships  . Social connections:    Talks on phone: Not on file    Gets together: Not on file    Attends religious service: Not on file    Active member of club or organization: Not on file    Attends  meetings of clubs or organizations: Not on file    Relationship status: Not on file  Other Topics Concern  . Not on file  Social History Narrative  . Not on file    Outpatient Encounter Medications as of 04/04/2018  Medication Sig  . albuterol (VENTOLIN HFA) 108 (90 Base) MCG/ACT inhaler Inhale into the lungs every 6 (six) hours as needed for wheezing or shortness of breath.  . allopurinol (ZYLOPRIM) 300 MG tablet TAKE 1 TABLET BY MOUTH DAILY  . amLODipine-olmesartan (AZOR) 5-40 MG per tablet Take 1 tablet by mouth daily.  . Ascorbic Acid (VITAMIN C PO) Take 2,000 mg by mouth daily.  . B Complex Vitamins (VITAMIN-B COMPLEX) TABS Take by mouth.  . Carbinoxamine Maleate 4 MG TABS Take 1 tablet (4 mg total) by mouth daily as needed.  . Cholecalciferol (VITAMIN D3) 3000 units TABS Take by mouth.  . colchicine 0.6 MG tablet Take 0.6 mg by mouth as needed.  . fluticasone (FLOVENT HFA) 110 MCG/ACT inhaler Inhale 2 puffs into the lungs 2 (two) times daily.  . furosemide (LASIX) 20 MG tablet Take 20 mg by mouth daily.  Marland Kitchen ipratropium (ATROVENT) 0.06 % nasal spray Place 2 sprays into both nostrils 3 (three) times daily. (Patient taking differently: Place 2 sprays into both nostrils as needed. )  . liraglutide (VICTOZA) 18 MG/3ML SOPN Inject into the skin.  . Melatonin 3 MG TABS Take 3 mg by mouth as needed.  . metoprolol succinate (TOPROL-XL) 50 MG 24 hr tablet Take 50 mg by mouth daily. Take with or immediately following a meal.  . Multiple Vitamin (MULTIVITAMIN+) LIQD Take by mouth.  . omega-3 acid ethyl esters (LOVAZA) 1 G capsule Take 2 g by mouth 2 (two) times daily. Reported on 10/20/2015  . potassium chloride (K-DUR) 10 MEQ tablet Take 10 mEq by mouth 1 day or 1 dose.  . pyridoxine (B-6) 200 MG tablet Take 200 mg by mouth daily.  Marland Kitchen triamcinolone cream (KENALOG) 0.1 % APPLY TO AFFECTED AREA TWICE A DAY FOR 7 DAYS  . TURMERIC CURCUMIN PO Take by mouth daily.  . Calcium Carbonate-Vit D-Min  (CALCIUM 1200 PO) Take by mouth daily.   No facility-administered encounter medications on file as of 04/04/2018.     Activities of Daily Living In your present state of health, do you have any difficulty performing the following activities: 04/04/2018  Hearing? N  Vision? N  Difficulty concentrating or making decisions? N  Walking or climbing stairs? Y  Comment due to arthritis and bad knee  Dressing or bathing? N  Doing errands, shopping? N  Conservation officer, nature and  eating ? N  Using the Toilet? N  In the past six months, have you accidently leaked urine? N  Do you have problems with loss of bowel control? Y  Comment past two weeks irregular, better now  Managing your Medications? N  Managing your Finances? N  Housekeeping or managing your Housekeeping? N  Some recent data might be hidden    Patient Care Team: Glendale Chard, MD as PCP - General (Internal Medicine)    Assessment:   This is a routine wellness examination for [place].  Exercise Activities and Dietary recommendations Current Exercise Habits: Structured exercise class, Type of exercise: strength training/weights, Time (Minutes): 45, Frequency (Times/Week): 4, Weekly Exercise (Minutes/Week): 180, Intensity: Intense, Exercise limited by: Other - see comments(water aerobics)  Goals    . Weight (lb) < 200 lb (90.7 kg) (pt-stated)     Wants to lose 20 pounds and exercise 5 days a week       Fall Risk Fall Risk  04/04/2018  Falls in the past year? 0  Risk for fall due to : Medication side effect  Follow up Falls prevention discussed   Is the patient's home free of loose throw rugs in walkways, pet beds, electrical cords, etc?   yes      Grab bars in the bathroom? no      Handrails on the stairs?   yes      Adequate lighting?   yes  Timed Get Up and Go performed: n/a  Depression Screen PHQ 2/9 Scores 04/04/2018  PHQ - 2 Score 0     Cognitive Function     6CIT Screen 04/04/2018  What Year? 0 points  What month?  0 points  What time? 0 points  Count back from 20 0 points  Months in reverse 0 points  Repeat phrase 0 points  Total Score 0    Immunization History  Administered Date(s) Administered  . Influenza-Unspecified 01/18/2018    Qualifies for Shingles Vaccine? yes  Screening Tests Health Maintenance  Topic Date Due  . HEMOGLOBIN A1C  05-08-1950  . Hepatitis C Screening  March 24, 1951  . FOOT EXAM  11/08/1959  . OPHTHALMOLOGY EXAM  11/08/1959  . PNA vac Low Risk Adult (1 of 2 - PCV13) 11/08/2014  . TETANUS/TDAP  08/13/2019  . COLONOSCOPY  10/13/2019  . MAMMOGRAM  01/16/2020  . INFLUENZA VACCINE  Completed  . DEXA SCAN  Completed    Cancer Screenings: Lung: Low Dose CT Chest recommended if Age 69-80 years, 30 pack-year currently smoking OR have quit w/in 15years. Patient does not qualify. Breast:  Up to date on Mammogram? Yes   Up to date of Bone Density/Dexa? Yes Colorectal: up to date  Additional Screenings: : Hepatitis C Screening: n/a     Plan:    Would like to have colonoscopy in the beginning of next year.   I have personally reviewed and noted the following in the patient's chart:   . Medical and social history . Use of alcohol, tobacco or illicit drugs  . Current medications and supplements . Functional ability and status . Nutritional status . Physical activity . Advanced directives . List of other physicians . Hospitalizations, surgeries, and ER visits in previous 12 months . Vitals . Screenings to include cognitive, depression, and falls . Referrals and appointments  In addition, I have reviewed and discussed with patient certain preventive protocols, quality metrics, and best practice recommendations. A written personalized care plan for preventive services as well as general preventive  health recommendations were provided to patient.     Kellie Simmering, LPN  [id number]

## 2018-04-04 NOTE — Progress Notes (Signed)
Pt could not stay, was expecting to see Dr Baird Cancer for a physical. She cant stay.

## 2018-04-04 NOTE — Patient Instructions (Signed)
Robin Odom , Thank you for taking time to come for your Medicare Wellness Visit. I appreciate your ongoing commitment to your health goals. Please review the following plan we discussed and let me know if I can assist you in the future.   Screening recommendations/referrals: Colonoscopy: 09/2009 Mammogram: 12/2017 Bone Density: 10/2016 Recommended yearly ophthalmology/optometry visit for glaucoma screening and checkup Recommended yearly dental visit for hygiene and checkup  Vaccinations: Influenza vaccine: 12/2017 Pneumococcal vaccine: 09/2016 Tdap vaccine: 07/2009 Shingles vaccine:     Advanced directives: Please bring a copy of your POA (Power of [place]) and/or Living Will to your next appointment.    Conditions/risks identified: Obesity  Next appointment: 04/10/2018 at 8:45a   Preventive Care 65 Years and Older, Female Preventive care refers to lifestyle choices and visits with your health care provider that can promote health and wellness. What does preventive care include?  A yearly physical exam. This is also called an annual well check.  Dental exams once or twice a year.  Routine eye exams. Ask your health care provider how often you should have your eyes checked.  Personal lifestyle choices, including:  Daily care of your teeth and gums.  Regular physical activity.  Eating a healthy diet.  Avoiding tobacco and drug use.  Limiting alcohol use.  Practicing safe sex.  Taking low-dose aspirin every day.  Taking vitamin and mineral supplements as recommended by your health care provider. What happens during an annual well check? The services and screenings done by your health care provider during your annual well check will depend on your age, overall health, lifestyle risk factors, and family history of disease. Counseling  Your health care provider may ask you questions about your:  Alcohol use.  Tobacco use.  Drug use.  Emotional well-being.  Home and  relationship well-being.  Sexual activity.  Eating habits.  History of falls.  Memory and ability to understand (cognition).  Work and work Statistician.  Reproductive health. Screening  You may have the following tests or measurements:  Height, weight, and BMI.  Blood pressure.  Lipid and cholesterol levels. These may be checked every 5 years, or more frequently if you are over 35 years old.  Skin check.  Lung cancer screening. You may have this screening every year starting at age 78 if you have a 30-pack-year history of smoking and currently smoke or have quit within the past 15 years.  Fecal occult blood test (FOBT) of the stool. You may have this test every year starting at age 82.  Flexible sigmoidoscopy or colonoscopy. You may have a sigmoidoscopy every 5 years or a colonoscopy every 10 years starting at age 22.  Hepatitis C blood test.  Hepatitis B blood test.  Sexually transmitted disease (STD) testing.  Diabetes screening. This is done by checking your blood sugar (glucose) after you have not eaten for a while (fasting). You may have this done every 1-3 years.  Bone density scan. This is done to screen for osteoporosis. You may have this done starting at age 3.  Mammogram. This may be done every 1-2 years. Talk to your health care provider about how often you should have regular mammograms. Talk with your health care provider about your test results, treatment options, and if necessary, the need for more tests. Vaccines  Your health care provider may recommend certain vaccines, such as:  Influenza vaccine. This is recommended every year.  Tetanus, diphtheria, and acellular pertussis (Tdap, Td) vaccine. You may need a Td booster  every 10 years.  Zoster vaccine. You may need this after age 15.  Pneumococcal 13-valent conjugate (PCV13) vaccine. One dose is recommended after age 105.  Pneumococcal polysaccharide (PPSV23) vaccine. One dose is recommended after  age 44. Talk to your health care provider about which screenings and vaccines you need and how often you need them. This information is not intended to replace advice given to you by your health care provider. Make sure you discuss any questions you have with your health care provider. Document Released: 05/14/2015 Document Revised: 01/05/2016 Document Reviewed: 02/16/2015 Elsevier Interactive Patient Education  2017 [place] Prevention in the Home Falls can cause injuries. They can happen to people of all ages. There are many things you can do to make your home safe and to help prevent falls. What can I do on the outside of my home?  Regularly fix the edges of walkways and driveways and fix any cracks.  Remove anything that might make you trip as you walk through a door, such as a raised step or threshold.  Trim any bushes or trees on the path to your home.  Use bright outdoor lighting.  Clear any walking paths of anything that might make someone trip, such as rocks or tools.  Regularly check to see if handrails are loose or broken. Make sure that both sides of any steps have handrails.  Any raised decks and porches should have guardrails on the edges.  Have any leaves, snow, or ice cleared regularly.  Use sand or salt on walking paths during winter.  Clean up any spills in your garage right away. This includes oil or grease spills. What can I do in the bathroom?  Use night lights.  Install grab bars by the toilet and in the tub and shower. Do not use towel bars as grab bars.  Use non-skid mats or decals in the tub or shower.  If you need to sit down in the shower, use a plastic, non-slip stool.  Keep the floor dry. Clean up any water that spills on the floor as soon as it happens.  Remove soap buildup in the tub or shower regularly.  Attach bath mats securely with double-sided non-slip rug tape.  Do not have throw rugs and other things on the floor that can  make you trip. What can I do in the bedroom?  Use night lights.  Make sure that you have a light by your bed that is easy to reach.  Do not use any sheets or blankets that are too big for your bed. They should not hang down onto the floor.  Have a firm chair that has side arms. You can use this for support while you get dressed.  Do not have throw rugs and other things on the floor that can make you trip. What can I do in the kitchen?  Clean up any spills right away.  Avoid walking on wet floors.  Keep items that you use a lot in easy-to-reach places.  If you need to reach something above you, use a strong step stool that has a grab bar.  Keep electrical cords out of the way.  Do not use floor polish or wax that makes floors slippery. If you must use wax, use non-skid floor wax.  Do not have throw rugs and other things on the floor that can make you trip. What can I do with my stairs?  Do not leave any items on the stairs.  Make  sure that there are handrails on both sides of the stairs and use them. Fix handrails that are broken or loose. Make sure that handrails are as long as the stairways.  Check any carpeting to make sure that it is firmly attached to the stairs. Fix any carpet that is loose or worn.  Avoid having throw rugs at the top or bottom of the stairs. If you do have throw rugs, attach them to the floor with carpet tape.  Make sure that you have a light switch at the top of the stairs and the bottom of the stairs. If you do not have them, ask someone to add them for you. What else can I do to help prevent falls?  Wear shoes that:  Do not have high heels.  Have rubber bottoms.  Are comfortable and fit you well.  Are closed at the toe. Do not wear sandals.  If you use a stepladder:  Make sure that it is fully opened. Do not climb a closed stepladder.  Make sure that both sides of the stepladder are locked into place.  Ask someone to hold it for you,  if possible.  Clearly mark and make sure that you can see:  Any grab bars or handrails.  First and last steps.  Where the edge of each step is.  Use tools that help you move around (mobility aids) if they are needed. These include:  Canes.  Walkers.  Scooters.  Crutches.  Turn on the lights when you go into a dark area. Replace any light bulbs as soon as they burn out.  Set up your furniture so you have a clear path. Avoid moving your furniture around.  If any of your floors are uneven, fix them.  If there are any pets around you, be aware of where they are.  Review your medicines with your doctor. Some medicines can make you feel dizzy. This can increase your chance of falling. Ask your doctor what other things that you can do to help prevent falls. This information is not intended to replace advice given to you by your health care provider. Make sure you discuss any questions you have with your health care provider. Document Released: 02/11/2009 Document Revised: 09/23/2015 Document Reviewed: 05/22/2014 Elsevier Interactive Patient Education  2017 [employer].

## 2018-04-10 ENCOUNTER — Encounter: Payer: Self-pay | Admitting: Internal Medicine

## 2018-04-10 ENCOUNTER — Ambulatory Visit (INDEPENDENT_AMBULATORY_CARE_PROVIDER_SITE_OTHER): Payer: Medicare Other | Admitting: Internal Medicine

## 2018-04-10 ENCOUNTER — Ambulatory Visit: Payer: Medicare Other | Admitting: Internal Medicine

## 2018-04-10 VITALS — BP 120/80 | HR 70 | Temp 97.9°F | Ht 65.0 in | Wt 235.4 lb

## 2018-04-10 DIAGNOSIS — Z0000 Encounter for general adult medical examination without abnormal findings: Secondary | ICD-10-CM | POA: Diagnosis not present

## 2018-04-10 DIAGNOSIS — E1165 Type 2 diabetes mellitus with hyperglycemia: Secondary | ICD-10-CM | POA: Diagnosis not present

## 2018-04-10 DIAGNOSIS — I10 Essential (primary) hypertension: Secondary | ICD-10-CM | POA: Diagnosis not present

## 2018-04-10 DIAGNOSIS — R198 Other specified symptoms and signs involving the digestive system and abdomen: Secondary | ICD-10-CM

## 2018-04-10 DIAGNOSIS — Z1212 Encounter for screening for malignant neoplasm of rectum: Secondary | ICD-10-CM | POA: Diagnosis not present

## 2018-04-10 DIAGNOSIS — L308 Other specified dermatitis: Secondary | ICD-10-CM

## 2018-04-10 LAB — CMP14 + ANION GAP
ALT: 22 IU/L (ref 0–32)
AST: 32 IU/L (ref 0–40)
Albumin/Globulin Ratio: 1.7 (ref 1.2–2.2)
Albumin: 4.3 g/dL (ref 3.6–4.8)
Alkaline Phosphatase: 72 IU/L (ref 39–117)
Anion Gap: 14 mmol/L (ref 10.0–18.0)
BUN/Creatinine Ratio: 19 (ref 12–28)
BUN: 14 mg/dL (ref 8–27)
Bilirubin Total: 0.4 mg/dL (ref 0.0–1.2)
CO2: 24 mmol/L (ref 20–29)
Calcium: 9.8 mg/dL (ref 8.7–10.3)
Chloride: 105 mmol/L (ref 96–106)
Creatinine, Ser: 0.75 mg/dL (ref 0.57–1.00)
GFR calc Af Amer: 95 mL/min/{1.73_m2} (ref >59)
GFR calc non Af Amer: 82 mL/min/{1.73_m2} (ref >59)
Globulin, Total: 2.6 g/dL (ref 1.5–4.5)
Glucose: 92 mg/dL (ref 65–99)
Potassium: 3.9 mmol/L (ref 3.5–5.2)
Sodium: 143 mmol/L (ref 134–144)
Total Protein: 6.9 g/dL (ref 6.0–8.5)

## 2018-04-10 LAB — POCT URINALYSIS DIPSTICK
Bilirubin, UA: NEGATIVE
Glucose, UA: NEGATIVE
Ketones, UA: NEGATIVE
Leukocytes, UA: NEGATIVE
Nitrite, UA: NEGATIVE
Protein, UA: NEGATIVE
Spec Grav, UA: 1.02 (ref 1.010–1.025)
Urobilinogen, UA: 0.2 E.U./dL
pH, UA: 6 (ref 5.0–8.0)

## 2018-04-10 LAB — POCT UA - MICROALBUMIN
Albumin/Creatinine Ratio, Urine, POC: <30
Creatinine, POC: 200 mg/dL
Microalbumin Ur, POC: 10 mg/L

## 2018-04-10 LAB — LIPID PANEL
Chol/HDL Ratio: 3.1 ratio (ref 0.0–4.4)
Cholesterol, Total: 165 mg/dL (ref 100–199)
HDL: 53 mg/dL (ref >39)
LDL Calculated: 103 mg/dL — ABNORMAL HIGH (ref 0–99)
Triglycerides: 47 mg/dL (ref 0–149)
VLDL Cholesterol Cal: 9 mg/dL (ref 5–40)

## 2018-04-10 LAB — CBC
Hematocrit: 40.6 % (ref 34.0–46.6)
Hemoglobin: 13.6 g/dL (ref 11.1–15.9)
MCH: 26.3 pg — ABNORMAL LOW (ref 26.6–33.0)
MCHC: 33.5 g/dL (ref 31.5–35.7)
MCV: 79 fL (ref 79–97)
Platelets: 356 10*3/uL (ref 150–450)
RBC: 5.17 x10E6/uL (ref 3.77–5.28)
RDW: 14.5 % (ref 12.3–15.4)
WBC: 8.9 10*3/uL (ref 3.4–10.8)

## 2018-04-10 LAB — HEMOGLOBIN A1C
Est. average glucose Bld gHb Est-mCnc: 117 mg/dL
Hgb A1c MFr Bld: 5.7 % — ABNORMAL HIGH (ref 4.8–5.6)

## 2018-04-10 MED ORDER — TRIAMCINOLONE ACETONIDE 0.1 % EX CREA: TOPICAL_CREAM | Freq: Two times a day (BID) | CUTANEOUS | 0 refills | Status: AC

## 2018-04-10 NOTE — Progress Notes (Signed)
Subjective:     Patient ID: Robin Odom , female    DOB: 11-Jan-1951 , 67 y.o.   MRN: [id number]   Chief Complaint  Patient presents with  . Annual Exam  . GI Problem    C/O not having enough bowel movements , acid reflux   . Back Pain    C/O lower right back pain   . Rash    C/O rash on back of neck     HPI  Pt is here for annual physical. Has had change in bowel habits 3 weeks ago, and though she has increased fiber, she is not back to her normal.  She does not exercise and has been doing poorly with her diet. Has been extremely busy and is not getting good sleep.   Past Medical History:  Diagnosis Date  . Arthritis   . Diabetes mellitus without complication ([place])   . Gout   . Hypertension      Family History  Problem Relation Age of Onset  . Heart disease Mother   . Arthritis Father   . Hypertension Father   . Prostate cancer Father   . Parkinson's disease Father   . Sinusitis Sister   . Allergic rhinitis Sister   . Diabetes Sister   . High Cholesterol Sister   . Diabetes Brother   . Hypertension Brother   . High Cholesterol Brother   . Angioedema Neg Hx   . Asthma Neg Hx   . Eczema Neg Hx   . Immunodeficiency Neg Hx   . Urticaria Neg Hx   . Breast cancer Neg Hx      Current Outpatient Medications:  .  albuterol (VENTOLIN HFA) 108 (90 Base) MCG/ACT inhaler, Inhale into the lungs every 6 (six) hours as needed for wheezing or shortness of breath., Disp: , Rfl:  .  allopurinol (ZYLOPRIM) 300 MG tablet, TAKE 1 TABLET BY MOUTH DAILY, Disp: 90 tablet, Rfl: 1 .  amLODipine-olmesartan (AZOR) 5-40 MG per tablet, Take 1 tablet by mouth daily., Disp: , Rfl:  .  Ascorbic Acid (VITAMIN C PO), Take 2,000 mg by mouth daily., Disp: , Rfl:  .  B Complex Vitamins (VITAMIN-B COMPLEX) TABS, Take by mouth., Disp: , Rfl:  .  Calcium Carbonate-Vit D-Min (CALCIUM 1200 PO), Take by mouth daily., Disp: , Rfl:  .  Carbinoxamine Maleate 4 MG TABS, Take 1 tablet (4 mg total) by mouth  daily as needed., Disp: 56 each, Rfl: 5 .  Cholecalciferol (VITAMIN D3) 3000 units TABS, Take by mouth., Disp: , Rfl:  .  colchicine 0.6 MG tablet, Take 0.6 mg by mouth as needed., Disp: , Rfl:  .  fluticasone (FLOVENT HFA) 110 MCG/ACT inhaler, Inhale 2 puffs into the lungs 2 (two) times daily., Disp: 1 Inhaler, Rfl: 5 .  furosemide (LASIX) 20 MG tablet, Take 20 mg by mouth daily., Disp: , Rfl: 2 .  ipratropium (ATROVENT) 0.06 % nasal spray, Place 2 sprays into both nostrils 3 (three) times daily. (Patient taking differently: Place 2 sprays into both nostrils as needed. ), Disp: 15 mL, Rfl: 5 .  liraglutide (VICTOZA) 18 MG/3ML SOPN, Inject into the skin., Disp: , Rfl:  .  Melatonin 3 MG TABS, Take 3 mg by mouth as needed., Disp: , Rfl:  .  metoprolol succinate (TOPROL-XL) 50 MG 24 hr tablet, Take 50 mg by mouth daily. Take with or immediately following a meal., Disp: , Rfl:  .  Multiple Vitamin (MULTIVITAMIN+) LIQD, Take by mouth., Disp: ,  Rfl:  .  omega-3 acid ethyl esters (LOVAZA) 1 G capsule, Take 2 g by mouth 2 (two) times daily. Reported on 10/20/2015, Disp: , Rfl:  .  potassium chloride (K-DUR) 10 MEQ tablet, Take 10 mEq by mouth 1 day or 1 dose., Disp: , Rfl:  .  pyridoxine (B-6) 200 MG tablet, Take 200 mg by mouth daily., Disp: , Rfl:  .  triamcinolone cream (KENALOG) 0.1 %, APPLY TO AFFECTED AREA TWICE A DAY FOR 7 DAYS, Disp: , Rfl: 1 .  TURMERIC CURCUMIN PO, Take by mouth daily., Disp: , Rfl:    Allergies  Allergen Reactions  . Codeine Other (See Comments)    Hallucinations  . Hydrocodone     Nausea   . Penicillins Hives     Review of Systems  Constitutional: Negative.   HENT: Negative.        Has chronic rhinitis  Eyes: Positive for visual disturbance.  Respiratory: Negative for cough and shortness of breath.   Cardiovascular: Positive for leg swelling. Negative for chest pain and palpitations.       Edema is not any worse  Gastrointestinal: Positive for constipation.  Negative for abdominal distention, abdominal pain, anal bleeding, blood in stool, diarrhea, nausea, rectal pain and vomiting.       Has developed bowel change 3 weeks.   Endocrine: Negative for cold intolerance, heat intolerance, polydipsia, polyphagia and polyuria.  Genitourinary: Negative for dysuria, enuresis, frequency and urgency.  Musculoskeletal: Positive for arthralgias, back pain and joint swelling. Negative for neck pain and neck stiffness.  Skin: Positive for rash.       Dry skin changes which occurs every Winter  Allergic/Immunologic: Negative for environmental allergies and food allergies.  Neurological: Negative for dizziness, tremors, syncope, weakness, light-headedness, numbness and headaches.  Hematological: Negative for adenopathy. Does not bruise/bleed easily.  Psychiatric/Behavioral: Positive for sleep disturbance.       Chronic poor sleep, been busy and only getting 5h per night.      Today's Vitals   04/10/18 0852  BP: 120/80  Pulse: 70  Temp: 97.9 F (36.6 C)  TempSrc: Oral  SpO2: 98%  Weight: 235 lb 6.4 oz (106.8 kg)  Height: 5\' 5"  (1.651 m)   Body mass index is 39.17 kg/m.   Objective:  Physical Exam   BP 120/80 (BP Location: Left Arm, Patient Position: Sitting, Cuff Size: Large)   Pulse 70   Temp 97.9 F (36.6 C) (Oral)   Ht 5\' 5"  (1.651 m)   Wt 235 lb 6.4 oz (106.8 kg)   SpO2 98%   BMI 39.17 kg/m   General Appearance:    Alert, cooperative, no distress, appears stated age  Head:    Normocephalic, without obvious abnormality, atraumatic  Eyes:    PERRL, conjunctiva/corneas clear, EOM's intact, fundi    benign, both eyes  Ears:    Normal TM's and external ear canals, both ears  Nose:   Nares normal, septum midline, mucosa normal, no drainage    or sinus tenderness  Throat:   Lips, mucosa, and tongue normal; teeth and gums normal  Neck:   Supple, symmetrical, trachea midline, no adenopathy;    thyroid:  no enlargement/tenderness/nodules; no  carotid   bruit  Back:     Symmetric, no curvature, ROM normal, no CVA tenderness  Lungs:     Clear to auscultation bilaterally, respirations unlabored  Chest Wall:    No tenderness or deformity   Heart:    Regular rate and rhythm,  S1 and S2 normal, no murmur, rub   or gallop  Breast Exam:    No tenderness, masses, or nipple abnormality  Abdomen:     Soft, non-tender, bowel sounds active all four quadrants,    no masses, no organomegaly     Rectal:    Normal tone, normal prostate, no masses or tenderness;   guaiac negative stool  Extremities:   Extremities normal, atraumatic, no cyanosis or edema  Pulses:   2+ and symmetric all extremities  Skin:   Skin color, texture, turgor normal, or lesions. Has a quater size silver dry patch on L posterior neck.   Lymph nodes:   Cervical, supraclavicular, and axillary nodes normal  Neurologic:   CNII-XII intact, normal strength, sensation and reflexes    Throughout. Normal [place], tandem gait, tip toe and heel gait and finger to nose.     Assessment And Plan:     1. General medical exam- routine. FU1 y  2. Essential hypertension- stable. May stay on same medication.  CBC, CMP, lipids ordered 3. Uncontrolled type 2 diabetes mellitus with hyperglycemia ([place])- chronic. May continue same meds unless labs are abnormal.  HGBA1C ordered.    4- Eczema-chronic- I refilled her triamcinolone  5- Change in bowel habits. - Negative hemoccult today. Sent to [place], PA-C

## 2018-04-10 NOTE — Patient Instructions (Signed)
Preventive Care 65 Years and Older, Female Preventive care refers to lifestyle choices and visits with your health care provider that can promote health and wellness. What does preventive care include?  A yearly physical exam. This is also called an annual well check.  Dental exams once or twice a year.  Routine eye exams. Ask your health care provider how often you should have your eyes checked.  Personal lifestyle choices, including: ? Daily care of your teeth and gums. ? Regular physical activity. ? Eating a healthy diet. ? Avoiding tobacco and drug use. ? Limiting alcohol use. ? Practicing safe sex. ? Taking low-dose aspirin every day. ? Taking vitamin and mineral supplements as recommended by your health care provider. What happens during an annual well check? The services and screenings done by your health care provider during your annual well check will depend on your age, overall health, lifestyle risk factors, and family history of disease. Counseling Your health care provider may ask you questions about your:  Alcohol use.  Tobacco use.  Drug use.  Emotional well-being.  Home and relationship well-being.  Sexual activity.  Eating habits.  History of falls.  Memory and ability to understand (cognition).  Work and work environment.  Reproductive health.  Screening You may have the following tests or measurements:  Height, weight, and BMI.  Blood pressure.  Lipid and cholesterol levels. These may be checked every 5 years, or more frequently if you are over 50 years old.  Skin check.  Lung cancer screening. You may have this screening every year starting at age 55 if you have a 30-pack-year history of smoking and currently smoke or have quit within the past 15 years.  Fecal occult blood test (FOBT) of the stool. You may have this test every year starting at age 50.  Flexible sigmoidoscopy or colonoscopy. You may have a sigmoidoscopy every 5 years or  a colonoscopy every 10 years starting at age 50.  Hepatitis C blood test.  Hepatitis B blood test.  Sexually transmitted disease (STD) testing.  Diabetes screening. This is done by checking your blood sugar (glucose) after you have not eaten for a while (fasting). You may have this done every 1-3 years.  Bone density scan. This is done to screen for osteoporosis. You may have this done starting at age 65.  Mammogram. This may be done every 1-2 years. Talk to your health care provider about how often you should have regular mammograms.  Talk with your health care provider about your test results, treatment options, and if necessary, the need for more tests. Vaccines Your health care provider may recommend certain vaccines, such as:  Influenza vaccine. This is recommended every year.  Tetanus, diphtheria, and acellular pertussis (Tdap, Td) vaccine. You may need a Td booster every 10 years.  Varicella vaccine. You may need this if you have not been vaccinated.  Zoster vaccine. You may need this after age 60.  Measles, mumps, and rubella (MMR) vaccine. You may need at least one dose of MMR if you were born in 1957 or later. You may also need a second dose.  Pneumococcal 13-valent conjugate (PCV13) vaccine. One dose is recommended after age 65.  Pneumococcal polysaccharide (PPSV23) vaccine. One dose is recommended after age 65.  Meningococcal vaccine. You may need this if you have certain conditions.  Hepatitis A vaccine. You may need this if you have certain conditions or if you travel or work in places where you may be exposed to hepatitis   A.  Hepatitis B vaccine. You may need this if you have certain conditions or if you travel or work in places where you may be exposed to hepatitis B.  Haemophilus influenzae type b (Hib) vaccine. You may need this if you have certain conditions.  Talk to your health care provider about which screenings and vaccines you need and how often you  need them. This information is not intended to replace advice given to you by your health care provider. Make sure you discuss any questions you have with your health care provider. Document Released: 05/14/2015 Document Revised: 01/05/2016 Document Reviewed: 02/16/2015 Elsevier Interactive Patient Education  2018 [employer]  

## 2018-04-11 ENCOUNTER — Encounter: Payer: Self-pay | Admitting: Internal Medicine

## 2018-04-11 LAB — POC HEMOCCULT BLD/STL (OFFICE/1-CARD/DIAGNOSTIC): Fecal Occult Blood, POC: NEGATIVE

## 2018-04-18 DIAGNOSIS — H35032 Hypertensive retinopathy, left eye: Secondary | ICD-10-CM | POA: Diagnosis not present

## 2018-04-18 DIAGNOSIS — H43812 Vitreous degeneration, left eye: Secondary | ICD-10-CM | POA: Diagnosis not present

## 2018-04-18 DIAGNOSIS — H348322 Tributary (branch) retinal vein occlusion, left eye, stable: Secondary | ICD-10-CM | POA: Diagnosis not present

## 2018-05-13 DIAGNOSIS — H43813 Vitreous degeneration, bilateral: Secondary | ICD-10-CM | POA: Diagnosis not present

## 2018-05-13 DIAGNOSIS — H35033 Hypertensive retinopathy, bilateral: Secondary | ICD-10-CM | POA: Diagnosis not present

## 2018-05-13 DIAGNOSIS — H34832 Tributary (branch) retinal vein occlusion, left eye, with macular edema: Secondary | ICD-10-CM | POA: Diagnosis not present

## 2018-05-14 ENCOUNTER — Ambulatory Visit (INDEPENDENT_AMBULATORY_CARE_PROVIDER_SITE_OTHER): Payer: Medicare Other | Admitting: Internal Medicine

## 2018-05-14 ENCOUNTER — Encounter: Payer: Self-pay | Admitting: Internal Medicine

## 2018-05-14 VITALS — BP 132/80 | HR 72 | Temp 98.5°F | Ht 65.0 in | Wt 241.4 lb

## 2018-05-14 DIAGNOSIS — I10 Essential (primary) hypertension: Secondary | ICD-10-CM | POA: Diagnosis not present

## 2018-05-14 DIAGNOSIS — R7303 Prediabetes: Secondary | ICD-10-CM | POA: Diagnosis not present

## 2018-05-14 DIAGNOSIS — E8881 Metabolic syndrome: Secondary | ICD-10-CM | POA: Diagnosis not present

## 2018-05-14 DIAGNOSIS — Z6841 Body Mass Index (BMI) 40.0 and over, adult: Secondary | ICD-10-CM | POA: Diagnosis not present

## 2018-05-14 NOTE — Progress Notes (Signed)
Subjective:     Patient ID: Robin Odom , female    DOB: November 22, 1950 , 68 y.o.   MRN: [id number]   Chief Complaint  Patient presents with  . Other    prediabetes    HPI  She is here today to discuss her medications. She has been taking Victoza for metabolic syndrome/insulin resistance. This has helped to lower her a1c. However, she reports it has become too cost prohibitive. She would like to try another medication.     Past Medical History:  Diagnosis Date  . Arthritis   . Diabetes mellitus without complication ([place])   . Gout   . Hypertension   . Partial retinal tear of both eyes without detachment 08/29/2017     Family History  Problem Relation Age of Onset  . Heart disease Mother   . Arthritis Father   . Hypertension Father   . Prostate cancer Father   . Parkinson's disease Father   . Sinusitis Sister   . Allergic rhinitis Sister   . Diabetes Sister   . High Cholesterol Sister   . Diabetes Brother   . Hypertension Brother   . High Cholesterol Brother   . Angioedema Neg Hx   . Asthma Neg Hx   . Eczema Neg Hx   . Immunodeficiency Neg Hx   . Urticaria Neg Hx   . Breast cancer Neg Hx      Current Outpatient Medications:  .  albuterol (VENTOLIN HFA) 108 (90 Base) MCG/ACT inhaler, Inhale into the lungs every 6 (six) hours as needed for wheezing or shortness of breath., Disp: , Rfl:  .  allopurinol (ZYLOPRIM) 300 MG tablet, TAKE 1 TABLET BY MOUTH DAILY, Disp: 90 tablet, Rfl: 1 .  amLODipine-olmesartan (AZOR) 5-40 MG per tablet, Take 1 tablet by mouth daily., Disp: , Rfl:  .  Ascorbic Acid (VITAMIN C PO), Take 2,000 mg by mouth daily., Disp: , Rfl:  .  B Complex Vitamins (VITAMIN-B COMPLEX) TABS, Take by mouth., Disp: , Rfl:  .  Calcium Carbonate-Vit D-Min (CALCIUM 1200 PO), Take by mouth daily., Disp: , Rfl:  .  Carbinoxamine Maleate 4 MG TABS, Take 1 tablet (4 mg total) by mouth daily as needed., Disp: 56 each, Rfl: 5 .  Cholecalciferol (VITAMIN D3) 3000 units TABS,  Take by mouth., Disp: , Rfl:  .  colchicine 0.6 MG tablet, Take 0.6 mg by mouth as needed., Disp: , Rfl:  .  fluticasone (FLOVENT HFA) 110 MCG/ACT inhaler, Inhale 2 puffs into the lungs 2 (two) times daily., Disp: 1 Inhaler, Rfl: 5 .  furosemide (LASIX) 20 MG tablet, Take 20 mg by mouth daily., Disp: , Rfl: 2 .  ipratropium (ATROVENT) 0.06 % nasal spray, Place 2 sprays into both nostrils 3 (three) times daily. (Patient taking differently: Place 2 sprays into both nostrils as needed. ), Disp: 15 mL, Rfl: 5 .  Melatonin 5 MG CAPS, Take by mouth., Disp: , Rfl:  .  metoprolol succinate (TOPROL-XL) 50 MG 24 hr tablet, Take 50 mg by mouth daily. Take with or immediately following a meal., Disp: , Rfl:  .  Multiple Vitamin (MULTIVITAMIN+) LIQD, Take by mouth., Disp: , Rfl:  .  omega-3 acid ethyl esters (LOVAZA) 1 G capsule, Take 2 g by mouth 2 (two) times daily. Reported on 10/20/2015, Disp: , Rfl:  .  potassium chloride (K-DUR) 10 MEQ tablet, Take 10 mEq by mouth 1 day or 1 dose., Disp: , Rfl:  .  pyridoxine (B-6) 200 MG tablet,  Take 200 mg by mouth daily., Disp: , Rfl:  .  TURMERIC CURCUMIN PO, Take by mouth daily., Disp: , Rfl:    Allergies  Allergen Reactions  . Codeine Other (See Comments)    Hallucinations  . Hydrocodone     Nausea   . Penicillins Hives     Review of Systems  Constitutional: Negative.   Respiratory: Negative.   Cardiovascular: Negative.   Gastrointestinal: Negative.   Neurological: Negative.   Psychiatric/Behavioral: Negative.      Today's Vitals   05/14/18 1550  BP: 132/80  Pulse: 72  Temp: 98.5 F (36.9 C)  TempSrc: Oral  Weight: 241 lb 6.4 oz (109.5 kg)  Height: 5\' 5"  (1.651 m)  PainSc: 6   PainLoc: Knee   Body mass index is 40.17 kg/m.   Objective:  Physical Exam Constitutional:      Appearance: Normal appearance. She is obese.  HENT:     Head: Normocephalic and atraumatic.  Cardiovascular:     Rate and Rhythm: Normal rate and regular rhythm.      Heart sounds: Normal heart sounds.  Pulmonary:     Effort: Pulmonary effort is normal.     Breath sounds: Normal breath sounds.  Skin:    General: Skin is warm.  Neurological:     General: No focal deficit present.     Mental Status: She is alert.  Psychiatric:        Mood and Affect: Mood normal.         Assessment And Plan:     1. Prediabetes  She was taught how to self administer Ozempic once weekly. She will start with 0.25mg  once weekly x 2 (first dose was today), and then go to 0.5mg  once weekly. Again, she denies family history of thyroid cancer. Possible side effects were discussed with the patient in full detail. She will rto in 6-8 weeks for re-evaluation. She has done well with Victoza in the past, so I do not anticipate her having any issues with the medication. All questions were answered to her satisfaction.   2. Insulin resistance  I will check labs as listed below. She has been on metformin in the past. I will revisit this at her next visit.   - Insulin, random(561)  3. Essential hypertension, benign  Well controlled. She will continue with current meds. She is encouraged to avoid adding salt to her foods.   4. Class 3 severe obesity due to excess calories with serious comorbidity and body mass index (BMI) of 40.0 to 44.9 in adult [facility])  She is encouraged to strive for BMI less than 32 to decrease cardiac risk. She is encouraged to exercise at least 30 minutes five days weekly.   Maximino Greenland, MD

## 2018-05-15 LAB — INSULIN, RANDOM: INSULIN: 42.7 u[IU]/mL — ABNORMAL HIGH (ref 2.6–24.9)

## 2018-05-15 NOTE — Progress Notes (Signed)
Here are your lab results:  As expected, your insulin level is elevated. Please touch base in a week or two to let me know how you feel on the Ozempic.   Enjoy Oprah!  RS

## 2018-05-22 DIAGNOSIS — L309 Dermatitis, unspecified: Secondary | ICD-10-CM | POA: Diagnosis not present

## 2018-05-22 DIAGNOSIS — Z01419 Encounter for gynecological examination (general) (routine) without abnormal findings: Secondary | ICD-10-CM | POA: Diagnosis not present

## 2018-05-27 ENCOUNTER — Other Ambulatory Visit: Payer: Self-pay | Admitting: Internal Medicine

## 2018-06-04 ENCOUNTER — Other Ambulatory Visit: Payer: Self-pay

## 2018-06-07 ENCOUNTER — Other Ambulatory Visit: Payer: Self-pay | Admitting: Rheumatology

## 2018-06-07 NOTE — Telephone Encounter (Signed)
Last visit: 02/27/18 Next Visit: 08/28/18 Labs: 04/10/18 stable  Okay to refill per Dr. Estanislado Pandy

## 2018-06-28 ENCOUNTER — Other Ambulatory Visit: Payer: Self-pay | Admitting: Internal Medicine

## 2018-07-08 ENCOUNTER — Encounter: Payer: Self-pay | Admitting: Internal Medicine

## 2018-07-11 ENCOUNTER — Ambulatory Visit: Payer: Medicare Other | Admitting: Internal Medicine

## 2018-07-11 ENCOUNTER — Other Ambulatory Visit: Payer: Self-pay

## 2018-07-11 ENCOUNTER — Encounter: Payer: Self-pay | Admitting: Internal Medicine

## 2018-07-11 ENCOUNTER — Ambulatory Visit (INDEPENDENT_AMBULATORY_CARE_PROVIDER_SITE_OTHER): Payer: Medicare Other | Admitting: Internal Medicine

## 2018-07-11 VITALS — BP 136/82 | HR 80 | Temp 98.5°F | Ht 65.0 in | Wt 240.0 lb

## 2018-07-11 DIAGNOSIS — Z6839 Body mass index (BMI) 39.0-39.9, adult: Secondary | ICD-10-CM

## 2018-07-11 DIAGNOSIS — E66812 Obesity, class 2: Secondary | ICD-10-CM

## 2018-07-11 DIAGNOSIS — R7303 Prediabetes: Secondary | ICD-10-CM | POA: Diagnosis not present

## 2018-07-11 DIAGNOSIS — M1A09X0 Idiopathic chronic gout, multiple sites, without tophus (tophi): Secondary | ICD-10-CM | POA: Diagnosis not present

## 2018-07-11 DIAGNOSIS — R05 Cough: Secondary | ICD-10-CM

## 2018-07-11 DIAGNOSIS — R059 Cough, unspecified: Secondary | ICD-10-CM

## 2018-07-11 DIAGNOSIS — I10 Essential (primary) hypertension: Secondary | ICD-10-CM | POA: Diagnosis not present

## 2018-07-11 MED ORDER — SEMAGLUTIDE(0.25 OR 0.5MG/DOS) 2 MG/1.5ML ~~LOC~~ SOPN: 0.5000 mg | PEN_INJECTOR | SUBCUTANEOUS | 1 refills | Status: DC

## 2018-07-11 MED ORDER — AMLODIPINE BESYLATE 5 MG PO TABS: 5.0000 mg | ORAL_TABLET | Freq: Every day | ORAL | 2 refills | Status: DC

## 2018-07-11 MED ORDER — OLMESARTAN MEDOXOMIL 40 MG PO TABS: 40.0000 mg | ORAL_TABLET | Freq: Every day | ORAL | 2 refills | Status: DC

## 2018-07-11 NOTE — Patient Instructions (Signed)

## 2018-07-12 LAB — BMP8+EGFR
BUN/Creatinine Ratio: 21 (ref 12–28)
BUN: 17 mg/dL (ref 8–27)
CO2: 27 mmol/L (ref 20–29)
Calcium: 9.8 mg/dL (ref 8.7–10.3)
Chloride: 103 mmol/L (ref 96–106)
Creatinine, Ser: 0.8 mg/dL (ref 0.57–1.00)
GFR calc Af Amer: 88 mL/min/{1.73_m2} (ref >59)
GFR calc non Af Amer: 76 mL/min/{1.73_m2} (ref >59)
Glucose: 88 mg/dL (ref 65–99)
Potassium: 3.9 mmol/L (ref 3.5–5.2)
Sodium: 144 mmol/L (ref 134–144)

## 2018-07-12 LAB — HEMOGLOBIN A1C
Est. average glucose Bld gHb Est-mCnc: 120 mg/dL
Hgb A1c MFr Bld: 5.8 % — ABNORMAL HIGH (ref 4.8–5.6)

## 2018-07-12 LAB — URIC ACID: Uric Acid: 3.8 mg/dL (ref 2.5–7.1)

## 2018-07-15 ENCOUNTER — Encounter: Payer: Self-pay | Admitting: Internal Medicine

## 2018-07-28 NOTE — Progress Notes (Signed)
Subjective:     Patient ID: Robin Odom , female    DOB: 09/24/1950 , 67 y.o.   MRN: [id number]   Chief Complaint  Patient presents with  . Other    prediabetes  . Hypertension    HPI  She is here today for f/u prediabetes. She was started on ozempic 0.25mg once weekly at her last visit. She has not had any issues with this medication.   Hypertension  This is a chronic problem. The current episode started more than 1 year ago. The problem has been gradually improving since onset. The problem is controlled. Pertinent negatives include no blurred vision, chest pain or shortness of breath.   She reports compliance with meds.   Past Medical History:  Diagnosis Date  . Arthritis   . Diabetes mellitus without complication ([facility])   . Gout   . Hypertension   . Partial retinal tear of both eyes without detachment 08/29/2017     Family History  Problem Relation Age of Onset  . Heart disease Mother   . Arthritis Father   . Hypertension Father   . Prostate cancer Father   . Parkinson's disease Father   . Sinusitis Sister   . Allergic rhinitis Sister   . Diabetes Sister   . High Cholesterol Sister   . Diabetes Brother   . Hypertension Brother   . High Cholesterol Brother   . Angioedema Neg Hx   . Asthma Neg Hx   . Eczema Neg Hx   . Immunodeficiency Neg Hx   . Urticaria Neg Hx   . Breast cancer Neg Hx      Current Outpatient Medications:  .  albuterol (VENTOLIN HFA) 108 (90 Base) MCG/ACT inhaler, Inhale into the lungs every 6 (six) hours as needed for wheezing or shortness of breath., Disp: , Rfl:  .  allopurinol (ZYLOPRIM) 300 MG tablet, TAKE 1 TABLET BY MOUTH DAILY, Disp: 90 tablet, Rfl: 1 .  amLODipine-olmesartan (AZOR) 5-40 MG per tablet, Take 1 tablet by mouth daily., Disp: , Rfl:  .  Ascorbic Acid (VITAMIN C PO), Take 2,000 mg by mouth daily., Disp: , Rfl:  .  B Complex Vitamins (VITAMIN-B COMPLEX) TABS, Take by mouth., Disp: , Rfl:  .  Calcium Carbonate-Vit D-Min  (CALCIUM 1200 PO), Take by mouth daily., Disp: , Rfl:  .  Carbinoxamine Maleate 4 MG TABS, Take 1 tablet (4 mg total) by mouth daily as needed., Disp: 56 each, Rfl: 5 .  Cholecalciferol (VITAMIN D3) 3000 units TABS, Take by mouth., Disp: , Rfl:  .  colchicine 0.6 MG tablet, Take 0.6 mg by mouth as needed., Disp: , Rfl:  .  fluticasone (FLOVENT HFA) 110 MCG/ACT inhaler, Inhale 2 puffs into the lungs 2 (two) times daily., Disp: 1 Inhaler, Rfl: 5 .  furosemide (LASIX) 20 MG tablet, TAKE 1 TABLET BY MOUTH EVERY DAY, Disp: 90 tablet, Rfl: 1 .  Melatonin 5 MG CAPS, Take by mouth., Disp: , Rfl:  .  metoprolol succinate (TOPROL-XL) 50 MG 24 hr tablet, TAKE 1 TABLET BY MOUTH EVERY DAY, Disp: 90 tablet, Rfl: 1 .  Multiple Vitamin (MULTIVITAMIN+) LIQD, Take by mouth., Disp: , Rfl:  .  omega-3 acid ethyl esters (LOVAZA) 1 G capsule, Take 2 g by mouth 2 (two) times daily. Reported on 10/20/2015, Disp: , Rfl:  .  potassium chloride (K-DUR) 10 MEQ tablet, Take 10 mEq by mouth 1 day or 1 dose., Disp: , Rfl:  .  pyridoxine (B-6) 200 MG tablet,   Take 200 mg by mouth daily., Disp: , Rfl:  .  Semaglutide,0.25 or 0.5MG/DOS, (OZEMPIC, 0.25 OR 0.5 MG/DOSE,) 2 MG/1.5ML SOPN, Inject 0.5 mg into the skin once a week., Disp: 1 pen, Rfl: 1 .  TURMERIC CURCUMIN PO, Take by mouth daily., Disp: , Rfl:  .  amLODipine (NORVASC) 5 MG tablet, Take 1 tablet (5 mg total) by mouth daily., Disp: 90 tablet, Rfl: 2 .  olmesartan (BENICAR) 40 MG tablet, Take 1 tablet (40 mg total) by mouth daily., Disp: 90 tablet, Rfl: 2   Allergies  Allergen Reactions  . Codeine Other (See Comments)    Hallucinations  . Hydrocodone     Nausea   . Penicillins Hives     Review of Systems  Constitutional: Negative.   Eyes: Negative for blurred vision.  Respiratory: Negative.  Negative for shortness of breath.   Cardiovascular: Negative.  Negative for chest pain.  Gastrointestinal: Negative.   Neurological: Negative.   Psychiatric/Behavioral:  Negative.      Today's Vitals   07/11/18 1546  BP: 136/82  Pulse: 80  Temp: 98.5 F (36.9 C)  TempSrc: Oral  Weight: 240 lb (108.9 kg)  Height: 5' 5" (1.651 m)  PainSc: 6   PainLoc: Ankle   Body mass index is 39.94 kg/m.   Objective:  Physical Exam Vitals signs and nursing note reviewed.  Constitutional:      Appearance: Normal appearance.  HENT:     Head: Normocephalic and atraumatic.     Right Ear: Tympanic membrane, ear canal and external ear normal.     Left Ear: Tympanic membrane, ear canal and external ear normal.  Cardiovascular:     Rate and Rhythm: Normal rate and regular rhythm.     Heart sounds: Normal heart sounds.  Pulmonary:     Effort: Pulmonary effort is normal.     Breath sounds: Normal breath sounds.  Skin:    General: Skin is warm.  Neurological:     General: No focal deficit present.     Mental Status: She is alert.  Psychiatric:        Mood and Affect: Mood normal.        Behavior: Behavior normal.         Assessment And Plan:     1. Prediabetes  She has done well with ozempic. She will continue with 0.5mg once weekly. She is advised to exercise no less than five days weekly for at least 30 minutes.   - Hemoglobin A1c - BMP8+EGFR  2. Essential hypertension, benign  Fair control. She will continue with current meds. However, I will break up Azor into two different rx olmesartan and amlodipine. She is encouraged to avoid adding salt to her foods. She is aware optimal bp is less than 130/80.   3. 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 tophus  I will check uric acid level and forward to her rheumatologist for further evaluation.   - Uric acid  4. Cough  Likely due to postnasal drip. She was given samples of xyzal to use nightly as needed. She is advised that she can get OTC. She will let me know if her sx persist.   5. Class 2 severe obesity due to excess calories with serious comorbidity and body mass index (BMI) of 39.0  to 39.9 in adult ([facility])  Importance of achieving optimal weight to decrease risk of cardiovascular disease and cancers was discussed with the patient in full detail. She is encouraged to start slowly -   start with 10 minutes twice daily at least three to four days per week and to gradually build to 30 minutes five days weekly. She was given tips to incorporate more activity into her daily routine - take stairs when possible, park farther away from grocery stores, etc.      Robyn N Sanders, MD  

## 2018-08-01 ENCOUNTER — Other Ambulatory Visit: Payer: Self-pay | Admitting: Internal Medicine

## 2018-08-05 DIAGNOSIS — H34832 Tributary (branch) retinal vein occlusion, left eye, with macular edema: Secondary | ICD-10-CM | POA: Diagnosis not present

## 2018-08-13 DIAGNOSIS — M21611 Bunion of right foot: Secondary | ICD-10-CM | POA: Insufficient documentation

## 2018-08-13 DIAGNOSIS — M6701 Short Achilles tendon (acquired), right ankle: Secondary | ICD-10-CM | POA: Diagnosis not present

## 2018-08-13 DIAGNOSIS — M7742 Metatarsalgia, left foot: Secondary | ICD-10-CM | POA: Diagnosis not present

## 2018-08-13 DIAGNOSIS — M7741 Metatarsalgia, right foot: Secondary | ICD-10-CM | POA: Diagnosis not present

## 2018-08-13 DIAGNOSIS — M2142 Flat foot [pes planus] (acquired), left foot: Secondary | ICD-10-CM | POA: Diagnosis not present

## 2018-08-13 DIAGNOSIS — M2141 Flat foot [pes planus] (acquired), right foot: Secondary | ICD-10-CM | POA: Diagnosis not present

## 2018-08-21 ENCOUNTER — Other Ambulatory Visit: Payer: Self-pay | Admitting: Internal Medicine

## 2018-08-28 ENCOUNTER — Ambulatory Visit: Payer: Medicare Other | Admitting: Physician Assistant

## 2018-09-07 ENCOUNTER — Other Ambulatory Visit: Payer: Self-pay | Admitting: Internal Medicine

## 2018-09-19 NOTE — Progress Notes (Signed)
Office Visit Note  Patient: Robin Odom             Date of Birth: April 08, 1951           MRN: [id number]             PCP: Glendale Chard, MD Referring: Glendale Chard, MD Visit Date: 10/03/2018 Occupation: @GUAROCC @  Subjective:  Pain in both hands   History of Present Illness: Robin Odom is a 68 y.o. female with history of gout, osteoarthritis, and DDD.  She takes allopurinol 300 mg po daily. She has not missed any doses recently. She denies any recent gout flares.  She states she avoids trigger foods.  She has been having increased pain and swelling in both hands.  She denies any other joint pain or joint swelling.  She does not have any concerns at this time.  She does not need any refills.   Activities of Daily Living:  Patient reports morning stiffness for 0  minutes.   Patient Denies nocturnal pain.  Difficulty dressing/grooming: Denies Difficulty climbing stairs: Denies Difficulty getting out of chair: Denies Difficulty using hands for taps, buttons, cutlery, and/or writing: Denies  Review of Systems  Constitutional: Negative for fatigue.  HENT: Negative for mouth sores, mouth dryness and nose dryness.   Eyes: Positive for dryness. Negative for pain and visual disturbance.  Respiratory: Negative for cough, hemoptysis, shortness of breath and difficulty breathing.   Cardiovascular: Positive for swelling in legs/feet. Negative for chest pain, palpitations and hypertension.  Gastrointestinal: Negative for blood in stool, constipation and diarrhea.  Endocrine: Negative for increased urination.  Genitourinary: Negative for painful urination.  Musculoskeletal: Positive for arthralgias and joint pain. Negative for joint swelling, myalgias, muscle weakness, morning stiffness, muscle tenderness and myalgias.  Skin: Negative for color change, pallor, rash, hair loss, nodules/bumps, skin tightness, ulcers and sensitivity to sunlight.  Allergic/Immunologic: Negative for susceptible to  infections.  Neurological: Negative for dizziness, numbness, headaches and weakness.  Hematological: Negative for swollen glands.  Psychiatric/Behavioral: Positive for sleep disturbance. Negative for depressed mood. The patient is not nervous/anxious.     PMFS History:  Patient Active Problem List   Diagnosis Date Noted   Prediabetes 05/14/2018   Essential hypertension, benign 05/14/2018   Class 3 severe obesity due to excess calories with serious comorbidity and body mass index (BMI) of 40.0 to 44.9 in adult ([place]) 05/14/2018   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 tophus 07/20/2016   Primary osteoarthritis of both hands 07/20/2016   Primary osteoarthritis of both knees 07/20/2016   Primary osteoarthritis of both feet 07/20/2016   DJD (degenerative joint disease), cervical 07/20/2016   Elevated CK 07/20/2016   History of humerus fracture 07/20/2016   History of hypertension 07/20/2016   History of diabetes mellitus 07/20/2016   History of hyperlipidemia 07/20/2016   Chronic rhinitis 10/20/2015   Anosmia 10/20/2015   Mild intermittent asthma 10/20/2015    Past Medical History:  Diagnosis Date   Arthritis    Diabetes mellitus without complication ([facility])    Gout    Hypertension    Partial retinal tear of both eyes without detachment 08/29/2017    Family History  Problem Relation Age of Onset   Heart disease Mother    Hypertension Mother    Diabetes Mother    Arthritis Father    Hypertension Father    Prostate cancer Father    Parkinson's disease Father    Heart disease Father    Sinusitis Sister  Allergic rhinitis Sister    Diabetes Sister    High Cholesterol Sister    Hypertension Sister    Diabetes Brother    Hypertension Brother    High Cholesterol Brother    Hypertension Brother    Angioedema Neg Hx    Asthma Neg Hx    Eczema Neg Hx    Immunodeficiency Neg Hx    Urticaria Neg Hx    Breast cancer Neg Hx     Past Surgical History:  Procedure Laterality Date   ABDOMINAL HYSTERECTOMY  1991   CARPAL TUNNEL RELEASE Right 2019   COLONOSCOPY     5 between 1994-2010   Left knee surgery  2006   ROTATOR CUFF REPAIR Left 2001   ROTATOR CUFF REPAIR Right 2003   SHOULDER SURGERY  2001/2003   TONSILECTOMY/ADENOIDECTOMY WITH MYRINGOTOMY  1971   Social History   Social History Narrative   Not on file   Immunization History  Administered Date(s) Administered   Influenza, High Dose Seasonal PF 02/05/2017   Influenza,inj,quad, With Preservative 02/13/2017   Influenza-Unspecified 02/03/2015, 01/18/2018   Pneumococcal Conjugate-13 01/20/2014   Pneumococcal-Unspecified 02/14/2017     Objective: Vital Signs: BP (!) 144/87 (BP Location: Left Wrist, Patient Position: Sitting, Cuff Size: Normal)    Pulse 78    Resp 14    Ht 5' 5.5" (1.664 m)    Wt 245 lb (111.1 kg)    BMI 40.15 kg/m    Physical Exam Vitals signs and nursing note reviewed.  Constitutional:      Appearance: She is well-developed.  HENT:     Head: Normocephalic and atraumatic.  Eyes:     Conjunctiva/sclera: Conjunctivae normal.  Neck:     Musculoskeletal: Normal range of motion.  Cardiovascular:     Rate and Rhythm: Normal rate and regular rhythm.     Heart sounds: Normal heart sounds.  Pulmonary:     Effort: Pulmonary effort is normal.     Breath sounds: Normal breath sounds.  Abdominal:     General: Bowel sounds are normal.     Palpations: Abdomen is soft.  Lymphadenopathy:     Cervical: No cervical adenopathy.  Skin:    General: Skin is warm and dry.     Capillary Refill: Capillary refill takes less than 2 seconds.  Neurological:     Mental Status: She is alert and oriented to person, place, and time.  Psychiatric:        Behavior: Behavior normal.      Musculoskeletal Exam: C-spine, thoracic spine, and lumbar spine good ROM.  Shoulder joints, elbow joints, wrist joints, MCPs, PIPs, and DIPs good  ROM with no synovitis.   Mild PIP synovial thickening. Cyst present on dorsal aspect between left 2nd and 3rd MCP joints. Limited extension of left knee joint. Right knee has good ROM.  No warmth or effusion of knee joints.  Pedal edema noted bilaterally. Varus deformity of bilateral 1st MTP joints.  Hammertoe of right 2nd toe.   CDAI Exam: CDAI Score: Not documented Patient Global Assessment: Not documented; Provider Global Assessment: Not documented Swollen: Not documented; Tender: Not documented Joint Exam   Not documented   There is currently no information documented on the homunculus. Go to the Rheumatology activity and complete the homunculus joint exam.  Investigation: No additional findings.  Imaging: No results found.  Recent Labs: Lab Results  Component Value Date   WBC 8.9 04/10/2018   HGB 13.6 04/10/2018   PLT 356 04/10/2018  NA 144 07/11/2018   K 3.9 07/11/2018   CL 103 07/11/2018   CO2 27 07/11/2018   GLUCOSE 88 07/11/2018   BUN 17 07/11/2018   CREATININE 0.80 07/11/2018   BILITOT 0.4 04/10/2018   ALKPHOS 72 04/10/2018   AST 32 04/10/2018   ALT 22 04/10/2018   PROT 6.9 04/10/2018   ALBUMIN 4.3 04/10/2018   CALCIUM 9.8 07/11/2018   GFRAA 88 07/11/2018    Speciality Comments: No specialty comments available.  Procedures:  No procedures performed Allergies: Codeine; Hydrocodone; and Penicillins   Assessment / Plan:     Visit Diagnoses: 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 tophus -she has not had any recent gout flares.  She is clinically doing well on allopurinol 300 mg 1 tablet by mouth daily.  She has not missed any doses recently.  She is compliant avoiding purine rich foods and alcohol.  Her uric acid level was 3.8 on 07/11/2018.   She will continue on  Allopurinol 300 mg by mouth daily.  She does not need any refills at this time.  She was advised to notify us if she develops any signs or symptoms of a gout flare.  She will follow up in 6  months.   Primary osteoarthritis of both hands: She has PIP synovial thickening but no tenderness or synovitis noted.  She has complete fist formation bilaterally. Joint protection and muscle strengthening were discussed.   Primary osteoarthritis of both knees: No warmth or effusion.  She has limited extension of the left knee joint.  Good ROM of the right knee joint on exam.  She has no difficulty climbing steps or getting up from a chair.    Primary osteoarthritis of both feet: She has varus deformity of bilateral 1st MTP joints.  Right 2nd hammertoe noted.  She has no tenderness or joint swelling.  She has pedal edema bilaterally.    DDD (degenerative disc disease), cervical: She has good ROM with no discomfort.  She has no symptoms of radiculopathy at this time.    Bilateral carpal tunnel syndrome: Asymptomatic at this time.    Other medical conditions are listed as follows:   History of humerus fracture  History of hyperlipidemia  History of hypertension  History of diabetes mellitus   Orders: No orders of the defined types were placed in this encounter.  No orders of the defined types were placed in this encounter.     Follow-Up Instructions: Return in about 6 months (around 04/04/2019) for Gout, Osteoarthritis.   Ofilia Neas, PA-C  Note - This record has been created using Dragon software.  Chart creation errors have been sought, but may not always  have been located. Such creation errors do not reflect on  the standard of medical care.

## 2018-10-03 ENCOUNTER — Encounter: Payer: Self-pay | Admitting: Physician Assistant

## 2018-10-03 ENCOUNTER — Ambulatory Visit (INDEPENDENT_AMBULATORY_CARE_PROVIDER_SITE_OTHER): Payer: Medicare Other | Admitting: Physician Assistant

## 2018-10-03 ENCOUNTER — Other Ambulatory Visit: Payer: Self-pay

## 2018-10-03 VITALS — BP 144/87 | HR 78 | Resp 14 | Ht 65.5 in | Wt 245.0 lb

## 2018-10-03 DIAGNOSIS — Z8781 Personal history of (healed) traumatic fracture: Secondary | ICD-10-CM

## 2018-10-03 DIAGNOSIS — M19041 Primary osteoarthritis, right hand: Secondary | ICD-10-CM | POA: Diagnosis not present

## 2018-10-03 DIAGNOSIS — M170 Bilateral primary osteoarthritis of knee: Secondary | ICD-10-CM | POA: Diagnosis not present

## 2018-10-03 DIAGNOSIS — M19042 Primary osteoarthritis, left hand: Secondary | ICD-10-CM

## 2018-10-03 DIAGNOSIS — Z8679 Personal history of other diseases of the circulatory system: Secondary | ICD-10-CM | POA: Diagnosis not present

## 2018-10-03 DIAGNOSIS — M5030 Other cervical disc degeneration, unspecified cervical region: Secondary | ICD-10-CM

## 2018-10-03 DIAGNOSIS — M19071 Primary osteoarthritis, right ankle and foot: Secondary | ICD-10-CM | POA: Diagnosis not present

## 2018-10-03 DIAGNOSIS — Z8639 Personal history of other endocrine, nutritional and metabolic disease: Secondary | ICD-10-CM

## 2018-10-03 DIAGNOSIS — G5603 Carpal tunnel syndrome, bilateral upper limbs: Secondary | ICD-10-CM

## 2018-10-03 DIAGNOSIS — M1A09X0 Idiopathic chronic gout, multiple sites, without tophus (tophi): Secondary | ICD-10-CM

## 2018-10-03 DIAGNOSIS — M19072 Primary osteoarthritis, left ankle and foot: Secondary | ICD-10-CM

## 2018-10-24 ENCOUNTER — Other Ambulatory Visit: Payer: Self-pay | Admitting: Internal Medicine

## 2018-11-11 DIAGNOSIS — H35033 Hypertensive retinopathy, bilateral: Secondary | ICD-10-CM | POA: Diagnosis not present

## 2018-11-11 DIAGNOSIS — H43813 Vitreous degeneration, bilateral: Secondary | ICD-10-CM | POA: Diagnosis not present

## 2018-11-11 DIAGNOSIS — H34832 Tributary (branch) retinal vein occlusion, left eye, with macular edema: Secondary | ICD-10-CM | POA: Diagnosis not present

## 2018-11-14 ENCOUNTER — Ambulatory Visit: Payer: Medicare Other | Admitting: Internal Medicine

## 2018-11-27 ENCOUNTER — Ambulatory Visit (INDEPENDENT_AMBULATORY_CARE_PROVIDER_SITE_OTHER): Payer: Medicare Other | Admitting: Internal Medicine

## 2018-11-27 ENCOUNTER — Encounter: Payer: Self-pay | Admitting: Internal Medicine

## 2018-11-27 ENCOUNTER — Other Ambulatory Visit: Payer: Self-pay

## 2018-11-27 VITALS — BP 146/90 | HR 75 | Temp 98.3°F | Ht 65.5 in | Wt 246.4 lb

## 2018-11-27 DIAGNOSIS — R7303 Prediabetes: Secondary | ICD-10-CM | POA: Diagnosis not present

## 2018-11-27 DIAGNOSIS — Z139 Encounter for screening, unspecified: Secondary | ICD-10-CM

## 2018-11-27 DIAGNOSIS — Z6841 Body Mass Index (BMI) 40.0 and over, adult: Secondary | ICD-10-CM

## 2018-11-27 DIAGNOSIS — I10 Essential (primary) hypertension: Secondary | ICD-10-CM

## 2018-11-27 NOTE — Patient Instructions (Signed)
Please weigh once weekly   Please email your readings to Dr. Baird Cancer in 3 weeks   Starting next Tuesday, August 4th - inject 0.5mg  x 2 on Tuesdays   Please let me know how you tolerate this over the next 2-3 weeks

## 2018-11-28 LAB — HEMOGLOBIN A1C
Est. average glucose Bld gHb Est-mCnc: 117 mg/dL
Hgb A1c MFr Bld: 5.7 % — ABNORMAL HIGH (ref 4.8–5.6)

## 2018-11-28 LAB — BMP8+EGFR
BUN/Creatinine Ratio: 20 (ref 12–28)
BUN: 17 mg/dL (ref 8–27)
CO2: 21 mmol/L (ref 20–29)
Calcium: 9.6 mg/dL (ref 8.7–10.3)
Chloride: 101 mmol/L (ref 96–106)
Creatinine, Ser: 0.84 mg/dL (ref 0.57–1.00)
GFR calc Af Amer: 82 mL/min/{1.73_m2} (ref >59)
GFR calc non Af Amer: 71 mL/min/{1.73_m2} (ref >59)
Glucose: 87 mg/dL (ref 65–99)
Potassium: 3.8 mmol/L (ref 3.5–5.2)
Sodium: 141 mmol/L (ref 134–144)

## 2018-11-29 LAB — NOVEL CORONAVIRUS, NAA: SARS-CoV-2, NAA: NOT DETECTED

## 2018-12-04 ENCOUNTER — Other Ambulatory Visit: Payer: Self-pay | Admitting: Internal Medicine

## 2018-12-11 NOTE — Progress Notes (Signed)
Subjective:     Patient ID: Robin Odom , female    DOB: 05-15-1950 , 68 y.o.   MRN: [id number]   Chief Complaint  Patient presents with  . Hypertension    HPI  Hypertension This is a chronic problem. The current episode started more than 1 year ago. The problem has been gradually improving since onset. The problem is uncontrolled. Pertinent negatives include no blurred vision, chest pain, palpitations or shortness of breath. Risk factors for coronary artery disease include obesity, post-menopausal state and sedentary lifestyle. Past treatments include angiotensin blockers and calcium channel blockers. The current treatment provides moderate improvement. Compliance problems include exercise.      Past Medical History:  Diagnosis Date  . Arthritis   . Diabetes mellitus without complication ([place])   . Gout   . Hypertension   . Partial retinal tear of both eyes without detachment 08/29/2017     Family History  Problem Relation Age of Onset  . Heart disease Mother   . Hypertension Mother   . Diabetes Mother   . Arthritis Father   . Hypertension Father   . Prostate cancer Father   . Parkinson's disease Father   . Heart disease Father   . Sinusitis Sister   . Allergic rhinitis Sister   . Diabetes Sister   . High Cholesterol Sister   . Hypertension Sister   . Diabetes Brother   . Hypertension Brother   . High Cholesterol Brother   . Hypertension Brother   . Angioedema Neg Hx   . Asthma Neg Hx   . Eczema Neg Hx   . Immunodeficiency Neg Hx   . Urticaria Neg Hx   . Breast cancer Neg Hx      Current Outpatient Medications:  .  albuterol (VENTOLIN HFA) 108 (90 Base) MCG/ACT inhaler, Inhale into the lungs every 6 (six) hours as needed for wheezing or shortness of breath., Disp: , Rfl:  .  allopurinol (ZYLOPRIM) 300 MG tablet, TAKE 1 TABLET BY MOUTH DAILY, Disp: 90 tablet, Rfl: 1 .  amLODipine (NORVASC) 5 MG tablet, Take 1 tablet (5 mg total) by mouth daily., Disp: 90 tablet,  Rfl: 2 .  Apple Cider Vinegar 500 MG TABS, Take by mouth 2 (two) times a day., Disp: , Rfl:  .  Ascorbic Acid (VITAMIN C PO), Take 2,000 mg by mouth daily., Disp: , Rfl:  .  B Complex Vitamins (VITAMIN-B COMPLEX) TABS, Take by mouth., Disp: , Rfl:  .  BENFOTIAMINE PO, Take by mouth 2 (two) times daily., Disp: , Rfl:  .  Calcium Carbonate-Vit D-Min (CALCIUM 1200 PO), Take by mouth daily., Disp: , Rfl:  .  Carbinoxamine Maleate 4 MG TABS, Take 1 tablet (4 mg total) by mouth daily as needed., Disp: 56 each, Rfl: 5 .  Cholecalciferol (VITAMIN D3) 3000 units TABS, Take by mouth., Disp: , Rfl:  .  colchicine 0.6 MG tablet, Take 0.6 mg by mouth as needed., Disp: , Rfl:  .  fluticasone (FLOVENT HFA) 110 MCG/ACT inhaler, Inhale 2 puffs into the lungs 2 (two) times daily., Disp: 1 Inhaler, Rfl: 5 .  furosemide (LASIX) 20 MG tablet, TAKE 1 TABLET BY MOUTH EVERY DAY, Disp: 90 tablet, Rfl: 1 .  Melatonin 5 MG CAPS, Take by mouth., Disp: , Rfl:  .  Multiple Vitamin (MULTIVITAMIN+) LIQD, Take by mouth., Disp: , Rfl:  .  olmesartan (BENICAR) 40 MG tablet, Take 1 tablet (40 mg total) by mouth daily., Disp: 90 tablet, Rfl: 2 .  omega-3 acid ethyl esters (LOVAZA) 1 G capsule, Take 2 g by mouth 2 (two) times daily. Reported on 10/20/2015, Disp: , Rfl:  .  OZEMPIC, 0.25 OR 0.5 MG/DOSE, 2 MG/1.5ML SOPN, INJECT 0.5MG INTO THE SKIN ONCE A WEEK, Disp: 3 pen, Rfl: 1 .  potassium chloride (MICRO-K) 10 MEQ CR capsule, TAKE 1 CAPSULE BY MOUTH EVERY DAY, Disp: 90 capsule, Rfl: 1 .  triamcinolone cream (KENALOG) 0.5 %, triamcinolone acetonide 0.5 % topical cream  APPLY A THIN LAYER TO THE AFFECTED AREA(S) 2 TIMES PER DAY AS NEEDED, Disp: , Rfl:  .  TURMERIC CURCUMIN PO, Take by mouth daily., Disp: , Rfl:  .  metoprolol succinate (TOPROL-XL) 50 MG 24 hr tablet, TAKE 1 TABLET BY MOUTH EVERY DAY, Disp: 90 tablet, Rfl: 1   Allergies  Allergen Reactions  . Codeine Other (See Comments)    Hallucinations  . Hydrocodone      Nausea   . Penicillins Hives     Review of Systems  Constitutional: Negative.   Eyes: Negative for blurred vision.  Respiratory: Negative for shortness of breath.   Cardiovascular: Negative.  Negative for chest pain and palpitations.  Gastrointestinal: Negative.   Neurological: Negative.   Psychiatric/Behavioral: Negative.      Today's Vitals   11/27/18 1546  BP: (!) 146/90  Pulse: 75  Temp: 98.3 F (36.8 C)  TempSrc: Oral  Weight: 246 lb 6.4 oz (111.8 kg)  Height: 5' 5.5" (1.664 m)  PainSc: 5   PainLoc: Generalized   Body mass index is 40.38 kg/m.   Objective:  Physical Exam Vitals signs and nursing note reviewed.  Constitutional:      Appearance: Normal appearance.  HENT:     Head: Normocephalic and atraumatic.  Cardiovascular:     Rate and Rhythm: Normal rate and regular rhythm.     Heart sounds: Normal heart sounds.  Pulmonary:     Effort: Pulmonary effort is normal.     Breath sounds: Normal breath sounds.  Skin:    General: Skin is warm.  Neurological:     General: No focal deficit present.     Mental Status: She is alert.  Psychiatric:        Mood and Affect: Mood normal.        Behavior: Behavior normal.         Assessment And Plan:     1. Essential hypertension, benign  Chronic, uncontrolled. I would like to increase her amlodipine to 33m daily. However, she prefers to work on diet/exercise. She will rto in 3 months for re-evaluation. She is encouraged to avoid adding salt to her foods and to aim for 150 minutes of exercise per week.   2. Prediabetes  HER A1C HAS BEEN ELEVATED IN THE PAST. I WILL CHECK AN A1C, BMET TODAY. SHE WAS ENCOURAGED TO AVOID SUGARY BEVERAGES AND PROCESSED FOODS INCLUDNG BREADS, RICE AND PASTA.  - Hemoglobin A1c - BMP8+EGFR  3. Class 3 severe obesity due to excess calories with serious comorbidity and body mass index (BMI) of 40.0 to 44.9 in adult ([facility]  Importance of achieving optimal weight to decrease risk of  cardiovascular disease and cancers was discussed with the patient in full detail. Importance of regular exercise was discussed with the patient.  She is encouraged to start slowly - start with 10 minutes twice daily at least three to four days per week and to gradually build to 30 minutes five days weekly. She was given tips to incorporate more activity  into her daily routine - take stairs when possible, park farther away from grocery stores, etc.    4. Encounter for screening  Test performed at her request.   - Novel Coronavirus, NAA ([facility])    Maximino Greenland, MD    THE PATIENT IS ENCOURAGED TO PRACTICE SOCIAL DISTANCING DUE TO THE COVID-19 PANDEMIC.

## 2018-12-13 ENCOUNTER — Encounter: Payer: Self-pay | Admitting: Internal Medicine

## 2018-12-16 ENCOUNTER — Ambulatory Visit: Payer: Medicare Other | Admitting: Internal Medicine

## 2018-12-16 DIAGNOSIS — M25561 Pain in right knee: Secondary | ICD-10-CM | POA: Diagnosis not present

## 2018-12-16 DIAGNOSIS — M545 Low back pain: Secondary | ICD-10-CM | POA: Diagnosis not present

## 2018-12-16 DIAGNOSIS — M5441 Lumbago with sciatica, right side: Secondary | ICD-10-CM | POA: Diagnosis not present

## 2018-12-16 DIAGNOSIS — Z23 Encounter for immunization: Secondary | ICD-10-CM | POA: Diagnosis not present

## 2018-12-17 ENCOUNTER — Encounter: Payer: Self-pay | Admitting: Internal Medicine

## 2018-12-25 ENCOUNTER — Encounter: Payer: Self-pay | Admitting: Internal Medicine

## 2019-01-05 DIAGNOSIS — Z20828 Contact with and (suspected) exposure to other viral communicable diseases: Secondary | ICD-10-CM | POA: Diagnosis not present

## 2019-01-06 ENCOUNTER — Other Ambulatory Visit: Payer: Self-pay | Admitting: Internal Medicine

## 2019-01-06 ENCOUNTER — Other Ambulatory Visit: Payer: Self-pay | Admitting: Rheumatology

## 2019-01-06 DIAGNOSIS — Z5181 Encounter for therapeutic drug level monitoring: Secondary | ICD-10-CM

## 2019-01-06 DIAGNOSIS — M1A09X0 Idiopathic chronic gout, multiple sites, without tophus (tophi): Secondary | ICD-10-CM

## 2019-01-06 DIAGNOSIS — Z79899 Other long term (current) drug therapy: Secondary | ICD-10-CM

## 2019-01-08 NOTE — Telephone Encounter (Signed)
Last Visit: 10/03/18 Next visit: 04/08/19 Labs: 11/27/18 BMP-WNL, 04/11/19 CBC, MCH 26.3   Patient advised she is due for labs. Patient will update 01/16/19.   Okay to refill 30 day supply per Dr. Estanislado Pandy

## 2019-01-16 ENCOUNTER — Other Ambulatory Visit: Payer: Self-pay

## 2019-01-16 DIAGNOSIS — Z79899 Other long term (current) drug therapy: Secondary | ICD-10-CM

## 2019-01-16 DIAGNOSIS — M1A09X0 Idiopathic chronic gout, multiple sites, without tophus (tophi): Secondary | ICD-10-CM

## 2019-01-17 LAB — COMPLETE METABOLIC PANEL WITH GFR
AG Ratio: 1.5 (calc) (ref 1.0–2.5)
ALT: 17 U/L (ref 6–29)
AST: 20 U/L (ref 10–35)
Albumin: 4.1 g/dL (ref 3.6–5.1)
Alkaline phosphatase (APISO): 61 U/L (ref 37–153)
BUN: 15 mg/dL (ref 7–25)
CO2: 27 mmol/L (ref 20–32)
Calcium: 9.5 mg/dL (ref 8.6–10.4)
Chloride: 106 mmol/L (ref 98–110)
Creat: 0.86 mg/dL (ref 0.50–0.99)
GFR, Est African American: 80 mL/min/{1.73_m2} (ref >=60)
GFR, Est Non African American: 69 mL/min/{1.73_m2} (ref >=60)
Globulin: 2.7 g/dL (calc) (ref 1.9–3.7)
Glucose, Bld: 90 mg/dL (ref 65–99)
Potassium: 3.9 mmol/L (ref 3.5–5.3)
Sodium: 143 mmol/L (ref 135–146)
Total Bilirubin: 0.4 mg/dL (ref 0.2–1.2)
Total Protein: 6.8 g/dL (ref 6.1–8.1)

## 2019-01-17 LAB — CBC WITH DIFFERENTIAL/PLATELET
Absolute Monocytes: 629 cells/uL (ref 200–950)
Basophils Absolute: 34 cells/uL (ref 0–200)
Basophils Relative: 0.4 %
Eosinophils Absolute: 136 cells/uL (ref 15–500)
Eosinophils Relative: 1.6 %
HCT: 40 % (ref 35.0–45.0)
Hemoglobin: 13 g/dL (ref 11.7–15.5)
Lymphs Abs: 2372 cells/uL (ref 850–3900)
MCH: 26.3 pg — ABNORMAL LOW (ref 27.0–33.0)
MCHC: 32.5 g/dL (ref 32.0–36.0)
MCV: 80.8 fL (ref 80.0–100.0)
MPV: 10.2 fL (ref 7.5–12.5)
Monocytes Relative: 7.4 %
Neutro Abs: 5330 cells/uL (ref 1500–7800)
Neutrophils Relative %: 62.7 %
Platelets: 324 10*3/uL (ref 140–400)
RBC: 4.95 10*6/uL (ref 3.80–5.10)
RDW: 14.7 % (ref 11.0–15.0)
Total Lymphocyte: 27.9 %
WBC: 8.5 10*3/uL (ref 3.8–10.8)

## 2019-01-17 LAB — URIC ACID: Uric Acid, Serum: 5 mg/dL (ref 2.5–7.0)

## 2019-01-17 NOTE — Progress Notes (Signed)
stable °

## 2019-01-17 NOTE — Progress Notes (Signed)
Please see Dr. Deveshwar's above note.

## 2019-01-20 ENCOUNTER — Other Ambulatory Visit: Payer: Self-pay | Admitting: Internal Medicine

## 2019-01-20 ENCOUNTER — Ambulatory Visit
Admission: RE | Admit: 2019-01-20 | Discharge: 2019-01-20 | Disposition: A | Discharge status: Home / Self Care | Payer: Medicare Other | Source: Ambulatory Visit | Attending: Internal Medicine | Admitting: Internal Medicine

## 2019-01-20 ENCOUNTER — Other Ambulatory Visit: Payer: Self-pay

## 2019-01-20 DIAGNOSIS — Z1231 Encounter for screening mammogram for malignant neoplasm of breast: Secondary | ICD-10-CM | POA: Diagnosis not present

## 2019-01-22 ENCOUNTER — Telehealth: Payer: Self-pay | Admitting: Internal Medicine

## 2019-01-22 NOTE — Chronic Care Management (AMB) (Signed)
°  Chronic Care Management   Outreach Note  01/22/2019 Name: Robin Odom MRN: [id number] DOB: [birth year]  Referred by: Glendale Chard, MD Reason for referral : Chronic Care Management (Initial CCM outreach was unsuccessful )   An unsuccessful telephone outreach was attempted today. The patient was referred to the case management team by for assistance with chronic care management and care coordination.   Follow Up Plan: A HIPPA compliant phone message was left for the patient providing contact information and requesting a return call.  The care management team will reach out to the patient again over the next 7 days.  If patient returns call to provider office, please advise to call Carrizo [place] at [place]  [email or web address]   ??[phone]

## 2019-01-23 NOTE — Chronic Care Management (AMB) (Signed)
Chronic Care Management   Note  01/23/2019 Name: Faline Langer MRN: [id number] DOB: 1950-10-23  Marlynn Hinckley is a 68 y.o. year old female who is a primary care patient of Glendale Chard, MD. I reached out to Consuello Bossier by phone today in response to a referral sent by Ms. Claiborne Rigg Apel's patient's health plan.     Ms. Fassnacht was given information about Chronic Care Management services today including:  1. CCM service includes personalized support from designated clinical staff supervised by her physician, including individualized plan of care and coordination with other care providers 2. 24/7 contact phone numbers for assistance for urgent and routine care needs. 3. Service will only be billed when office clinical staff spend 20 minutes or more in a month to coordinate care. 4. Only one practitioner may furnish and bill the service in a calendar month. 5. The patient may stop CCM services at any time (effective at the end of the month) by phone call to the office staff. 6. The patient will be responsible for cost sharing (co-pay) of up to 20% of the service fee (after annual deductible is met).  Patient agreed to services and verbal consent obtained.   Follow up plan: Telephone appointment with CCM team member scheduled for: 02/24/2019  [place]  [email or web address]   ??[phone]

## 2019-01-30 ENCOUNTER — Other Ambulatory Visit: Payer: Self-pay | Admitting: Rheumatology

## 2019-01-30 NOTE — Telephone Encounter (Signed)
Last Visit: 10/03/18 Next visit: 04/08/19 Labs: 01/16/19 stable  Okay to refill per Dr. Estanislado Pandy

## 2019-02-23 ENCOUNTER — Encounter: Payer: Self-pay | Admitting: Internal Medicine

## 2019-02-24 ENCOUNTER — Other Ambulatory Visit: Payer: Self-pay

## 2019-02-24 ENCOUNTER — Telehealth: Payer: Medicare Other

## 2019-03-03 DIAGNOSIS — H43813 Vitreous degeneration, bilateral: Secondary | ICD-10-CM | POA: Diagnosis not present

## 2019-03-03 DIAGNOSIS — H348322 Tributary (branch) retinal vein occlusion, left eye, stable: Secondary | ICD-10-CM | POA: Diagnosis not present

## 2019-03-03 DIAGNOSIS — H35033 Hypertensive retinopathy, bilateral: Secondary | ICD-10-CM | POA: Diagnosis not present

## 2019-03-19 ENCOUNTER — Other Ambulatory Visit: Payer: Self-pay | Admitting: Internal Medicine

## 2019-03-23 ENCOUNTER — Other Ambulatory Visit: Payer: Self-pay | Admitting: Internal Medicine

## 2019-03-23 DIAGNOSIS — Z20828 Contact with and (suspected) exposure to other viral communicable diseases: Secondary | ICD-10-CM | POA: Diagnosis not present

## 2019-04-01 ENCOUNTER — Encounter: Payer: Self-pay | Admitting: Internal Medicine

## 2019-04-01 ENCOUNTER — Other Ambulatory Visit: Payer: Self-pay

## 2019-04-01 MED ORDER — POTASSIUM CHLORIDE ER 10 MEQ PO CPCR: ORAL_CAPSULE | ORAL | 0 refills | Status: DC

## 2019-04-07 NOTE — Progress Notes (Signed)
Virtual Visit via Video Note  I connected with Robin Odom on 04/08/19 at 10:40 AM EST by a video enabled telemedicine application and verified that I am speaking with the correct person using two identifiers.  Location: Patient: Home  Provider: Clinic  This service was conducted via virtual visit.  Both audio and visual tools were used.  The patient was located at home. I was located in my office.  Consent was obtained prior to the virtual visit and is aware of possible charges through their insurance for this visit.  The patient is an established patient.  Dr. Estanislado Pandy, MD conducted the virtual visit and Hazel Sams, PA-C acted as scribe during the service.  Office staff helped with scheduling follow up visits after the service was conducted.     I discussed the limitations of evaluation and management by telemedicine and the availability of in person appointments. The patient expressed understanding and agreed to proceed.  CC: Trigger finger History of Present Illness: Patient is a 68 year old female with a past medical history of gout and osteoarthritis.  She is taking allopurinol 300 mg 1 tablet daily for management of gout. She has not had any recent gout flares.  She denies any joint pain or joint inflammation currently but she does experience arthralgias with weather changes.  She has been working with a Physiological scientist twice weekly.  She is not having any knee joint pain at this time. She has developed a trigger finger of the left middle finger.  Review of Systems  Constitutional: Negative for fever and malaise/fatigue.  Eyes: Negative for photophobia, pain, discharge and redness.  Respiratory: Negative for cough, shortness of breath and wheezing.   Cardiovascular: Negative for chest pain and palpitations.  Gastrointestinal: Negative for blood in stool, constipation and diarrhea.  Genitourinary: Negative for dysuria.  Musculoskeletal: Positive for joint pain. Negative for back pain,  myalgias and neck pain.       +Morning stiffness  Denies joint swelling   Skin: Negative for rash.  Neurological: Negative for dizziness and headaches.  Psychiatric/Behavioral: Negative for depression. The patient is not nervous/anxious and does not have insomnia.       Observations/Objective: Physical Exam  Constitutional: She is oriented to person, place, and time and well-developed, well-nourished, and in no distress.  HENT:  Head: Normocephalic and atraumatic.  Eyes: Conjunctivae are normal.  Pulmonary/Chest: Effort normal.  Neurological: She is alert and oriented to person, place, and time.  Psychiatric: Mood, memory, affect and judgment normal.   Patient reports morning stiffness for  10 minutes.   Patient denies nocturnal pain.  Difficulty dressing/grooming: Denies Difficulty climbing stairs: Denies Difficulty getting out of chair: Denies Difficulty using hands for taps, buttons, cutlery, and/or writing: Denies   Assessment and Plan: Visit Diagnoses: 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 tophus - She has not had any recent gout flares.  She is taking Allopurinol 300 mg 1 tablet by mouth daily.  uric acid was 5.0 on 01/16/19. She will be due to update lab work in March 2021.  She was encouraged to continue to avoid a purine rich diet.  She will continue taking allopurinol as prescribed. She does not need any refills at this time. She was advised to notify us if she develops signs or symptoms of a gout flare.  She will follow up in 6 months.   Trigger finger, left middle finger: She has been experiencing tenderness and locking intermittently.  We discussed using a splint or buddy  tape for rest.  She has not found voltaren gel to be effective.    Primary osteoarthritis of both hands:  She is not having any joint pain or joint swelling.  Joint protection and muscle strengthening were discussed.   Primary osteoarthritis of both knees: She is not having any knee joint  pain or joint swelling. She is working out with a Physiological scientist twice a week.  She has been unable to go to the pool for exercise due to the pandemic.    Primary osteoarthritis of both feet: She has varus deformity of bilateral 1st MTP joints.  She has no feet pain or joint swelling currently.  She was encouraged to wear proper fitting shoes.  DDD (degenerative disc disease), cervical: She has been experiencing increased neck stiffness.  She is looking for a chiropractor currently.  She is not having any symptoms of radiculopathy at this time.   Bilateral carpal tunnel syndrome: Asymptomatic at this time.    Other medical conditions are listed as follows:   History of humerus fracture  History of hyperlipidemia  History of hypertension  History of diabetes mellitus   Follow Up Instructions: She will follow up in 6 months.    I discussed the assessment and treatment plan with the patient. The patient was provided an opportunity to ask questions and all were answered. The patient agreed with the plan and demonstrated an understanding of the instructions.   The patient was advised to call back or seek an in-person evaluation if the symptoms worsen or if the condition fails to improve as anticipated.  I provided 15 minutes of non-face-to-face time during this encounter.   Bo Merino, MD   Scribed by-  Hazel Sams, PA-C

## 2019-04-08 ENCOUNTER — Other Ambulatory Visit: Payer: Self-pay

## 2019-04-08 ENCOUNTER — Telehealth (INDEPENDENT_AMBULATORY_CARE_PROVIDER_SITE_OTHER): Payer: Medicare Other | Admitting: Rheumatology

## 2019-04-08 ENCOUNTER — Encounter: Payer: Self-pay | Admitting: Rheumatology

## 2019-04-08 DIAGNOSIS — Z5181 Encounter for therapeutic drug level monitoring: Secondary | ICD-10-CM

## 2019-04-08 DIAGNOSIS — M19071 Primary osteoarthritis, right ankle and foot: Secondary | ICD-10-CM

## 2019-04-08 DIAGNOSIS — M19042 Primary osteoarthritis, left hand: Secondary | ICD-10-CM

## 2019-04-08 DIAGNOSIS — Z8781 Personal history of (healed) traumatic fracture: Secondary | ICD-10-CM

## 2019-04-08 DIAGNOSIS — M19041 Primary osteoarthritis, right hand: Secondary | ICD-10-CM | POA: Diagnosis not present

## 2019-04-08 DIAGNOSIS — G5603 Carpal tunnel syndrome, bilateral upper limbs: Secondary | ICD-10-CM | POA: Diagnosis not present

## 2019-04-08 DIAGNOSIS — M65332 Trigger finger, left middle finger: Secondary | ICD-10-CM

## 2019-04-08 DIAGNOSIS — M1A09X0 Idiopathic chronic gout, multiple sites, without tophus (tophi): Secondary | ICD-10-CM

## 2019-04-08 DIAGNOSIS — M19072 Primary osteoarthritis, left ankle and foot: Secondary | ICD-10-CM

## 2019-04-08 DIAGNOSIS — M5030 Other cervical disc degeneration, unspecified cervical region: Secondary | ICD-10-CM | POA: Diagnosis not present

## 2019-04-08 DIAGNOSIS — Z8679 Personal history of other diseases of the circulatory system: Secondary | ICD-10-CM

## 2019-04-08 DIAGNOSIS — Z8639 Personal history of other endocrine, nutritional and metabolic disease: Secondary | ICD-10-CM | POA: Diagnosis not present

## 2019-04-08 DIAGNOSIS — M170 Bilateral primary osteoarthritis of knee: Secondary | ICD-10-CM | POA: Diagnosis not present

## 2019-04-14 ENCOUNTER — Encounter: Payer: Self-pay | Admitting: Internal Medicine

## 2019-04-16 ENCOUNTER — Ambulatory Visit (INDEPENDENT_AMBULATORY_CARE_PROVIDER_SITE_OTHER): Payer: Medicare Other

## 2019-04-16 ENCOUNTER — Encounter: Payer: Self-pay | Admitting: Internal Medicine

## 2019-04-16 ENCOUNTER — Ambulatory Visit (INDEPENDENT_AMBULATORY_CARE_PROVIDER_SITE_OTHER): Payer: Medicare Other | Admitting: Internal Medicine

## 2019-04-16 ENCOUNTER — Other Ambulatory Visit: Payer: Self-pay

## 2019-04-16 VITALS — BP 150/82 | HR 70 | Temp 98.3°F | Ht 64.8 in | Wt 243.4 lb

## 2019-04-16 DIAGNOSIS — I10 Essential (primary) hypertension: Secondary | ICD-10-CM

## 2019-04-16 DIAGNOSIS — R7309 Other abnormal glucose: Secondary | ICD-10-CM

## 2019-04-16 DIAGNOSIS — Z6841 Body Mass Index (BMI) 40.0 and over, adult: Secondary | ICD-10-CM | POA: Diagnosis not present

## 2019-04-16 DIAGNOSIS — Z0000 Encounter for general adult medical examination without abnormal findings: Secondary | ICD-10-CM

## 2019-04-16 DIAGNOSIS — R202 Paresthesia of skin: Secondary | ICD-10-CM

## 2019-04-16 DIAGNOSIS — M79609 Pain in unspecified limb: Secondary | ICD-10-CM

## 2019-04-16 LAB — POCT URINALYSIS DIPSTICK
Bilirubin, UA: NEGATIVE
Blood, UA: NEGATIVE
Glucose, UA: NEGATIVE
Ketones, UA: NEGATIVE
Leukocytes, UA: NEGATIVE
Nitrite, UA: NEGATIVE
Protein, UA: NEGATIVE
Spec Grav, UA: 1.02 (ref 1.010–1.025)
Urobilinogen, UA: 0.2 E.U./dL
pH, UA: 5.5 (ref 5.0–8.0)

## 2019-04-16 LAB — POCT UA - MICROALBUMIN
Albumin/Creatinine Ratio, Urine, POC: <30
Creatinine, POC: 200 mg/dL
Microalbumin Ur, POC: 10 mg/L

## 2019-04-16 MED ORDER — OLMESARTAN MEDOXOMIL 40 MG PO TABS: 40.0000 mg | ORAL_TABLET | Freq: Every day | ORAL | 2 refills | Status: DC

## 2019-04-16 MED ORDER — METOPROLOL SUCCINATE ER 50 MG PO TB24: 50.0000 mg | ORAL_TABLET | Freq: Every day | ORAL | 1 refills | Status: DC

## 2019-04-16 MED ORDER — FUROSEMIDE 20 MG PO TABS: 20.0000 mg | ORAL_TABLET | Freq: Every day | ORAL | 1 refills | Status: DC

## 2019-04-16 NOTE — Addendum Note (Signed)
Addended by: Glenna Durand E on: 04/16/2019 12:27 PM   Modules accepted: Orders

## 2019-04-16 NOTE — Patient Instructions (Addendum)
INCREASE AMLODIPINE TO 10MG  - TAKE TWO 5MG  TABLETS IN THE EVENINGS  MERRY CHRISTMAS!    Health Maintenance, Female Adopting a healthy lifestyle and getting preventive care are important in promoting health and wellness. Ask your health care provider about:  The right schedule for you to have regular tests and exams.  Things you can do on your own to prevent diseases and keep yourself healthy. What should I know about diet, weight, and exercise? Eat a healthy diet   Eat a diet that includes plenty of vegetables, fruits, low-fat dairy products, and lean protein.  Do not eat a lot of foods that are high in solid fats, added sugars, or sodium. Maintain a healthy weight Body mass index (BMI) is used to identify weight problems. It estimates body fat based on height and weight. Your health care provider can help determine your BMI and help you achieve or maintain a healthy weight. Get regular exercise Get regular exercise. This is one of the most important things you can do for your health. Most adults should:  Exercise for at least 150 minutes each week. The exercise should increase your heart rate and make you sweat (moderate-intensity exercise).  Do strengthening exercises at least twice a week. This is in addition to the moderate-intensity exercise.  Spend less time sitting. Even light physical activity can be beneficial. Watch cholesterol and blood lipids Have your blood tested for lipids and cholesterol at 69 years of age, then have this test every 5 years. Have your cholesterol levels checked more often if:  Your lipid or cholesterol levels are high.  You are older than 69 years of age.  You are at high risk for heart disease. What should I know about cancer screening? Depending on your health history and family history, you may need to have cancer screening at various ages. This may include screening for:  Breast cancer.  Cervical cancer.  Colorectal cancer.  Skin  cancer.  Lung cancer. What should I know about heart disease, diabetes, and high blood pressure? Blood pressure and heart disease  High blood pressure causes heart disease and increases the risk of stroke. This is more likely to develop in people who have high blood pressure readings, are of African descent, or are overweight.  Have your blood pressure checked: ? Every 3-5 years if you are 18-43 years of age. ? Every year if you are 31 years old or older. Diabetes Have regular diabetes screenings. This checks your fasting blood sugar level. Have the screening done:  Once every three years after age 53 if you are at a normal weight and have a low risk for diabetes.  More often and at a younger age if you are overweight or have a high risk for diabetes. What should I know about preventing infection? Hepatitis B If you have a higher risk for hepatitis B, you should be screened for this virus. Talk with your health care provider to find out if you are at risk for hepatitis B infection. Hepatitis C Testing is recommended for:  Everyone born from [birth year] through 1966.  Anyone with known risk factors for hepatitis C. Sexually transmitted infections (STIs)  Get screened for STIs, including gonorrhea and chlamydia, if: ? You are sexually active and are younger than 69 years of age. ? You are older than 69 years of age and your health care provider tells you that you are at risk for this type of infection. ? Your sexual activity has changed since you were  last screened, and you are at increased risk for chlamydia or gonorrhea. Ask your health care provider if you are at risk.  Ask your health care provider about whether you are at high risk for HIV. Your health care provider may recommend a prescription medicine to help prevent HIV infection. If you choose to take medicine to prevent HIV, you should first get tested for HIV. You should then be tested every 3 months for as long as you are taking  the medicine. Pregnancy  If you are about to stop having your period (premenopausal) and you may become pregnant, seek counseling before you get pregnant.  Take 400 to 800 micrograms (mcg) of folic acid every day if you become pregnant.  Ask for birth control (contraception) if you want to prevent pregnancy. Osteoporosis and menopause Osteoporosis is a disease in which the bones lose minerals and strength with aging. This can result in bone fractures. If you are 90 years old or older, or if you are at risk for osteoporosis and fractures, ask your health care provider if you should:  Be screened for bone loss.  Take a calcium or vitamin D supplement to lower your risk of fractures.  Be given hormone replacement therapy (HRT) to treat symptoms of menopause. Follow these instructions at home: Lifestyle  Do not use any products that contain nicotine or tobacco, such as cigarettes, e-cigarettes, and chewing tobacco. If you need help quitting, ask your health care provider.  Do not use street drugs.  Do not share needles.  Ask your health care provider for help if you need support or information about quitting drugs. Alcohol use  Do not drink alcohol if: ? Your health care provider tells you not to drink. ? You are pregnant, may be pregnant, or are planning to become pregnant.  If you drink alcohol: ? Limit how much you use to 0-1 drink a day. ? Limit intake if you are breastfeeding.  Be aware of how much alcohol is in your drink. In the [place] one drink equals one 12 oz bottle of beer (355 mL), one 5 oz glass of wine (148 mL), or one 1 oz glass of hard liquor (44 mL). General instructions  Schedule regular health, dental, and eye exams.  Stay current with your vaccines.  Tell your health care provider if: ? You often feel depressed. ? You have ever been abused or do not feel safe at home. Summary  Adopting a healthy lifestyle and getting preventive care are important in  promoting health and wellness.  Follow your health care provider's instructions about healthy diet, exercising, and getting tested or screened for diseases.  Follow your health care provider's instructions on monitoring your cholesterol and blood pressure. This information is not intended to replace advice given to you by your health care provider. Make sure you discuss any questions you have with your health care provider. Document Released: 10/31/2010 Document Revised: 04/10/2018 Document Reviewed: 04/10/2018 Elsevier Patient Education  2020 [employer].

## 2019-04-16 NOTE — Progress Notes (Signed)
This visit occurred during the SARS-CoV-2 public health emergency.  Safety protocols were in place, including screening questions prior to the visit, additional usage of staff PPE, and extensive cleaning of exam room while observing appropriate contact time as indicated for disinfecting solutions.  Subjective:   Robin Odom is a 68 y.o. female who presents for Medicare Annual (Subsequent) preventive examination.  Review of Systems:  n/a Cardiac Risk Factors include: advanced age (>23men, >2 women);hypertension;obesity (BMI >30kg/m2);sedentary lifestyle     Objective:     Vitals: BP (!) 150/82 (BP Location: Left Arm, Patient Position: Sitting, Cuff Size: Large)   Pulse 70   Temp 98.3 F (36.8 C) (Oral)   Ht 5' 4.8" (1.646 m)   Wt 243 lb 6.4 oz (110.4 kg)   SpO2 97%   BMI 40.75 kg/m   Body mass index is 40.75 kg/m.  Advanced Directives 04/16/2019 04/04/2018  Does Patient Have a Medical Advance Directive? Yes Yes  Type of Paramedic of [place];Living will [place];Living will  Does patient want to make changes to medical advance directive? - No - Patient declined  Copy of [place] in Chart? No - copy requested No - copy requested    Tobacco Social History   Tobacco Use  Smoking Status Never Smoker  Smokeless Tobacco Never Used     Counseling given: Not Answered   Clinical Intake:  Pre-visit preparation completed: Yes  Pain : 0-10 Pain Score: 6  Pain Type: Chronic pain Pain Location: Knee Pain Orientation: Right, Left Pain Descriptors / Indicators: Aching Pain Onset: More than a month ago Pain Frequency: Intermittent     Nutritional Status: BMI > 30  Obese Nutritional Risks: None Diabetes: No  How often do you need to have someone help you when you read instructions, pamphlets, or other written materials from your doctor or pharmacy?: 1 - Never What is the last grade level you completed in  school?: [name]  Interpreter Needed?: No  Information entered by :: NAllen LPN  Past Medical History:  Diagnosis Date  . Arthritis   . Diabetes mellitus without complication ([place])   . Gout   . Hypertension   . Partial retinal tear of both eyes without detachment 08/29/2017   Past Surgical History:  Procedure Laterality Date  . ABDOMINAL HYSTERECTOMY  1991  . CARPAL TUNNEL RELEASE Right 2019  . COLONOSCOPY     5 between 1994-2010  . Left knee surgery  2006  . ROTATOR CUFF REPAIR Left 2001  . ROTATOR CUFF REPAIR Right 2003  . SHOULDER SURGERY  2001/2003  . TONSILECTOMY/ADENOIDECTOMY WITH MYRINGOTOMY  1971   Family History  Problem Relation Age of Onset  . Heart disease Mother   . Hypertension Mother   . Diabetes Mother   . Arthritis Father   . Hypertension Father   . Prostate cancer Father   . Parkinson's disease Father   . Heart disease Father   . Sinusitis Sister   . Allergic rhinitis Sister   . Diabetes Sister   . High Cholesterol Sister   . Hypertension Sister   . Diabetes Brother   . Hypertension Brother   . High Cholesterol Brother   . Hypertension Brother   . Angioedema Neg Hx   . Asthma Neg Hx   . Eczema Neg Hx   . Immunodeficiency Neg Hx   . Urticaria Neg Hx   . Breast cancer Neg Hx    Social History   Socioeconomic History  .  Marital status: Single    Spouse name: Not on file  . Number of children: Not on file  . Years of education: Not on file  . Highest education level: Not on file  Occupational History  . Occupation: retired  Tobacco Use  . Smoking status: Never Smoker  . Smokeless tobacco: Never Used  Substance and Sexual Activity  . Alcohol use: Yes    Comment: rarely  . Drug use: No  . Sexual activity: Not Currently    Birth control/protection: None    Comment: Hysterectomy  Other Topics Concern  . Not on file  Social History Narrative  . Not on file   Social Determinants of Health   Financial Resource Strain: Low Risk   .  Difficulty of Paying Living Expenses: Not hard at all  Food Insecurity: No Food Insecurity  . Worried About Charity fundraiser in the Last Year: Never true  . Ran Out of Food in the Last Year: Never true  Transportation Needs: No Transportation Needs  . Lack of Transportation (Medical): No  . Lack of Transportation (Non-Medical): No  Physical Activity: Insufficiently Active  . Days of Exercise per Week: 3 days  . Minutes of Exercise per Session: 30 min  Stress: No Stress Concern Present  . Feeling of Stress : Not at all  Social Connections:   . Frequency of Communication with Friends and Family: Not on file  . Frequency of Social Gatherings with Friends and Family: Not on file  . Attends Religious Services: Not on file  . Active Member of Clubs or Organizations: Not on file  . Attends Archivist Meetings: Not on file  . Marital Status: Not on file    Outpatient Encounter Medications as of 04/16/2019  Medication Sig  . albuterol (VENTOLIN HFA) 108 (90 Base) MCG/ACT inhaler Inhale into the lungs every 6 (six) hours as needed for wheezing or shortness of breath.  . allopurinol (ZYLOPRIM) 300 MG tablet TAKE 1 TABLET BY MOUTH EVERY DAY  . amLODipine (NORVASC) 5 MG tablet TAKE 1 TABLET BY MOUTH EVERY DAY  . Apple Cider Vinegar 500 MG TABS Take by mouth 2 (two) times a day.  . Ascorbic Acid (VITAMIN C PO) Take 2,000 mg by mouth daily.  . B Complex Vitamins (VITAMIN-B COMPLEX) TABS Take by mouth.  . BENFOTIAMINE PO Take by mouth 2 (two) times daily.  . Calcium Carbonate-Vit D-Min (CALCIUM 1200 PO) Take by mouth daily.  . Carbinoxamine Maleate 4 MG TABS Take 1 tablet (4 mg total) by mouth daily as needed.  . Cholecalciferol (VITAMIN D3) 3000 units TABS Take by mouth.  . colchicine 0.6 MG tablet Take 0.6 mg by mouth as needed.  . fluticasone (FLOVENT HFA) 110 MCG/ACT inhaler Inhale 2 puffs into the lungs 2 (two) times daily.  . furosemide (LASIX) 20 MG tablet TAKE 1 TABLET BY  MOUTH EVERY DAY  . Melatonin 5 MG CAPS Take by mouth.  . metoprolol succinate (TOPROL-XL) 50 MG 24 hr tablet TAKE 1 TABLET BY MOUTH EVERY DAY  . Multiple Vitamin (MULTIVITAMIN+) LIQD Take by mouth.  . olmesartan (BENICAR) 40 MG tablet Take 1 tablet (40 mg total) by mouth daily.  Marland Kitchen omega-3 acid ethyl esters (LOVAZA) 1 G capsule Take 2 g by mouth 2 (two) times daily. Reported on 10/20/2015  . OZEMPIC, 0.25 OR 0.5 MG/DOSE, 2 MG/1.5ML SOPN INJECT 0.5MG  INTO THE SKIN ONCE A WEEK  . potassium chloride (MICRO-K) 10 MEQ CR capsule TAKE 1 CAPSULE  BY MOUTH EVERY DAY  . triamcinolone cream (KENALOG) 0.5 % triamcinolone acetonide 0.5 % topical cream  APPLY A THIN LAYER TO THE AFFECTED AREA(S) 2 TIMES PER DAY AS NEEDED  . TURMERIC CURCUMIN PO Take by mouth daily.  Marland Kitchen zinc sulfate 220 (50 Zn) MG capsule Take 220 mg by mouth daily.   No facility-administered encounter medications on file as of 04/16/2019.    Activities of Daily Living In your present state of health, do you have any difficulty performing the following activities: 04/16/2019  Hearing? N  Vision? N  Difficulty concentrating or making decisions? N  Walking or climbing stairs? N  Dressing or bathing? N  Doing errands, shopping? N  Preparing Food and eating ? N  Using the Toilet? N  In the past six months, have you accidently leaked urine? Y  Comment if held too long  Do you have problems with loss of bowel control? N  Managing your Medications? N  Managing your Finances? N  Housekeeping or managing your Housekeeping? N  Some recent data might be hidden    Patient Care Team: Glendale Chard, MD as PCP - General (Internal Medicine) Rex Kras, Claudette Stapler, RN as [facility]    Assessment:   This is a routine wellness examination for [place].  Exercise Activities and Dietary recommendations Current Exercise Habits: Home exercise routine, Time (Minutes): 30, Frequency (Times/Week): 3, Weekly Exercise (Minutes/Week):  90  Goals    . Weight (lb) < 200 lb (90.7 kg) (pt-stated)     Wants to lose 20 pounds and exercise 5 days a week    . Weight (lb) < 200 lb (90.7 kg)     04/16/2019, would like to lose 20 pounds gained during covid       Fall Risk Fall Risk  04/16/2019 11/27/2018 07/11/2018 05/14/2018 04/10/2018  Falls in the past year? 0 0 0 0 0  Comment - - - - -  Risk for fall due to : Medication side effect - - - -  Follow up Education provided;Falls evaluation completed;Falls prevention discussed - - - -   Is the patient's home free of loose throw rugs in walkways, pet beds, electrical cords, etc?   yes      Grab bars in the bathroom? no      Handrails on the stairs?   yes      Adequate lighting?   yes  Timed Get Up and Go performed: n/a  Depression Screen PHQ 2/9 Scores 04/16/2019 07/11/2018 05/14/2018 04/10/2018  PHQ - 2 Score 0 0 0 0  PHQ- 9 Score 3 - - -     Cognitive Function     6CIT Screen 04/16/2019 04/04/2018  What Year? 0 points 0 points  What month? 0 points 0 points  What time? 0 points 0 points  Count back from 20 0 points 0 points  Months in reverse 0 points 0 points  Repeat phrase 0 points 0 points  Total Score 0 0    Immunization History  Administered Date(s) Administered  . Influenza, High Dose Seasonal PF 02/05/2017  . Influenza,inj,quad, With Preservative 02/13/2017  . Influenza-Unspecified 02/03/2015, 01/18/2018, 12/16/2018  . Pneumococcal Conjugate-13 01/20/2014  . Pneumococcal-Unspecified 02/14/2017    Qualifies for Shingles Vaccine? yes  Screening Tests Health Maintenance  Topic Date Due  . FOOT EXAM  11/08/1960  . HEMOGLOBIN A1C  05/30/2019  . TETANUS/TDAP  08/13/2019  . COLONOSCOPY  10/13/2019  . OPHTHALMOLOGY EXAM  11/11/2019  . MAMMOGRAM  01/19/2021  . INFLUENZA VACCINE  Completed  . DEXA SCAN  Completed  . Hepatitis C Screening  Completed  . PNA vac Low Risk Adult  Completed    Cancer Screenings: Lung: Low Dose CT Chest recommended if  Age 36-80 years, 30 pack-year currently smoking OR have quit w/in 15years. Patient does not qualify. Breast:  Up to date on Mammogram? Yes   Up to date of Bone Density/Dexa? Yes Colorectal: up to date  Additional Screenings: : Hepatitis C Screening: 10/10/2012     Plan:    Patient wants to lose 20 pounds gained during covid.   I have personally reviewed and noted the following in the patient's chart:   . Medical and social history . Use of alcohol, tobacco or illicit drugs  . Current medications and supplements . Functional ability and status . Nutritional status . Physical activity . Advanced directives . List of other physicians . Hospitalizations, surgeries, and ER visits in previous 12 months . Vitals . Screenings to include cognitive, depression, and falls . Referrals and appointments  In addition, I have reviewed and discussed with patient certain preventive protocols, quality metrics, and best practice recommendations. A written personalized care plan for preventive services as well as general preventive health recommendations were provided to patient.     Kellie Simmering, LPN  [id number]

## 2019-04-16 NOTE — Patient Instructions (Signed)
Ms. Robin Odom , Thank you for taking time to come for your Medicare Wellness Visit. I appreciate your ongoing commitment to your health goals. Please review the following plan we discussed and let me know if I can assist you in the future.   Screening recommendations/referrals: Colonoscopy: 09/2009 Mammogram: 12/2018 Bone Density: 10/2016 Recommended yearly ophthalmology/optometry visit for glaucoma screening and checkup Recommended yearly dental visit for hygiene and checkup  Vaccinations: Influenza vaccine: 11/2018 Pneumococcal vaccine: 01/2017 Tdap vaccine: 07/2009 Shingles vaccine: discussed    Advanced directives: Please bring a copy of your POA (Power of [place]) and/or Living Will to your next appointment.    Conditions/risks identified: obesity  Next appointment:    Preventive Care 69 Years and Older, Female Preventive care refers to lifestyle choices and visits with your health care provider that can promote health and wellness. What does preventive care include?  A yearly physical exam. This is also called an annual well check.  Dental exams once or twice a year.  Routine eye exams. Ask your health care provider how often you should have your eyes checked.  Personal lifestyle choices, including:  Daily care of your teeth and gums.  Regular physical activity.  Eating a healthy diet.  Avoiding tobacco and drug use.  Limiting alcohol use.  Practicing safe sex.  Taking low-dose aspirin every day.  Taking vitamin and mineral supplements as recommended by your health care provider. What happens during an annual well check? The services and screenings done by your health care provider during your annual well check will depend on your age, overall health, lifestyle risk factors, and family history of disease. Counseling  Your health care provider may ask you questions about your:  Alcohol use.  Tobacco use.  Drug use.  Emotional well-being.  Home and  relationship well-being.  Sexual activity.  Eating habits.  History of falls.  Memory and ability to understand (cognition).  Work and work Statistician.  Reproductive health. Screening  You may have the following tests or measurements:  Height, weight, and BMI.  Blood pressure.  Lipid and cholesterol levels. These may be checked every 5 years, or more frequently if you are over 48 years old.  Skin check.  Lung cancer screening. You may have this screening every year starting at age 69 if you have a 30-pack-year history of smoking and currently smoke or have quit within the past 15 years.  Fecal occult blood test (FOBT) of the stool. You may have this test every year starting at age 69.  Flexible sigmoidoscopy or colonoscopy. You may have a sigmoidoscopy every 5 years or a colonoscopy every 10 years starting at age 69.  Hepatitis C blood test.  Hepatitis B blood test.  Sexually transmitted disease (STD) testing.  Diabetes screening. This is done by checking your blood sugar (glucose) after you have not eaten for a while (fasting). You may have this done every 1-3 years.  Bone density scan. This is done to screen for osteoporosis. You may have this done starting at age 40.  Mammogram. This may be done every 1-2 years. Talk to your health care provider about how often you should have regular mammograms. Talk with your health care provider about your test results, treatment options, and if necessary, the need for more tests. Vaccines  Your health care provider may recommend certain vaccines, such as:  Influenza vaccine. This is recommended every year.  Tetanus, diphtheria, and acellular pertussis (Tdap, Td) vaccine. You may need a Td booster every 10  years.  Zoster vaccine. You may need this after age 69.  Pneumococcal 13-valent conjugate (PCV13) vaccine. One dose is recommended after age 69.  Pneumococcal polysaccharide (PPSV23) vaccine. One dose is recommended after  age 69. Talk to your health care provider about which screenings and vaccines you need and how often you need them. This information is not intended to replace advice given to you by your health care provider. Make sure you discuss any questions you have with your health care provider. Document Released: 05/14/2015 Document Revised: 01/05/2016 Document Reviewed: 02/16/2015 Elsevier Interactive Patient Education  2017 [place] Prevention in the Home Falls can cause injuries. They can happen to people of all ages. There are many things you can do to make your home safe and to help prevent falls. What can I do on the outside of my home?  Regularly fix the edges of walkways and driveways and fix any cracks.  Remove anything that might make you trip as you walk through a door, such as a raised step or threshold.  Trim any bushes or trees on the path to your home.  Use bright outdoor lighting.  Clear any walking paths of anything that might make someone trip, such as rocks or tools.  Regularly check to see if handrails are loose or broken. Make sure that both sides of any steps have handrails.  Any raised decks and porches should have guardrails on the edges.  Have any leaves, snow, or ice cleared regularly.  Use sand or salt on walking paths during winter.  Clean up any spills in your garage right away. This includes oil or grease spills. What can I do in the bathroom?  Use night lights.  Install grab bars by the toilet and in the tub and shower. Do not use towel bars as grab bars.  Use non-skid mats or decals in the tub or shower.  If you need to sit down in the shower, use a plastic, non-slip stool.  Keep the floor dry. Clean up any water that spills on the floor as soon as it happens.  Remove soap buildup in the tub or shower regularly.  Attach bath mats securely with double-sided non-slip rug tape.  Do not have throw rugs and other things on the floor that can  make you trip. What can I do in the bedroom?  Use night lights.  Make sure that you have a light by your bed that is easy to reach.  Do not use any sheets or blankets that are too big for your bed. They should not hang down onto the floor.  Have a firm chair that has side arms. You can use this for support while you get dressed.  Do not have throw rugs and other things on the floor that can make you trip. What can I do in the kitchen?  Clean up any spills right away.  Avoid walking on wet floors.  Keep items that you use a lot in easy-to-reach places.  If you need to reach something above you, use a strong step stool that has a grab bar.  Keep electrical cords out of the way.  Do not use floor polish or wax that makes floors slippery. If you must use wax, use non-skid floor wax.  Do not have throw rugs and other things on the floor that can make you trip. What can I do with my stairs?  Do not leave any items on the stairs.  Make sure that  there are handrails on both sides of the stairs and use them. Fix handrails that are broken or loose. Make sure that handrails are as long as the stairways.  Check any carpeting to make sure that it is firmly attached to the stairs. Fix any carpet that is loose or worn.  Avoid having throw rugs at the top or bottom of the stairs. If you do have throw rugs, attach them to the floor with carpet tape.  Make sure that you have a light switch at the top of the stairs and the bottom of the stairs. If you do not have them, ask someone to add them for you. What else can I do to help prevent falls?  Wear shoes that:  Do not have high heels.  Have rubber bottoms.  Are comfortable and fit you well.  Are closed at the toe. Do not wear sandals.  If you use a stepladder:  Make sure that it is fully opened. Do not climb a closed stepladder.  Make sure that both sides of the stepladder are locked into place.  Ask someone to hold it for you,  if possible.  Clearly mark and make sure that you can see:  Any grab bars or handrails.  First and last steps.  Where the edge of each step is.  Use tools that help you move around (mobility aids) if they are needed. These include:  Canes.  Walkers.  Scooters.  Crutches.  Turn on the lights when you go into a dark area. Replace any light bulbs as soon as they burn out.  Set up your furniture so you have a clear path. Avoid moving your furniture around.  If any of your floors are uneven, fix them.  If there are any pets around you, be aware of where they are.  Review your medicines with your doctor. Some medicines can make you feel dizzy. This can increase your chance of falling. Ask your doctor what other things that you can do to help prevent falls. This information is not intended to replace advice given to you by your health care provider. Make sure you discuss any questions you have with your health care provider. Document Released: 02/11/2009 Document Revised: 09/23/2015 Document Reviewed: 05/22/2014 Elsevier Interactive Patient Education  2017 [employer].

## 2019-04-16 NOTE — Progress Notes (Signed)
This visit occurred during the SARS-CoV-2 public health emergency.  Safety protocols were in place, including screening questions prior to the visit, additional usage of staff PPE, and extensive cleaning of exam room while observing appropriate contact time as indicated for disinfecting solutions.  Subjective:     Patient ID: Robin Odom , female    DOB: November 22, 1950 , 68 y.o.   MRN: [id number]   Chief Complaint  Patient presents with  . Annual Exam  . Hypertension    HPI  She is here today for a full physical examination. She is followed by Dr. Everett Graff for her GYN care. She is due to see her January 2021.   Hypertension This is a chronic problem. The current episode started more than 1 year ago. The problem has been gradually improving since onset. The problem is uncontrolled. Pertinent negatives include no blurred vision, chest pain, palpitations or shortness of breath. Risk factors for coronary artery disease include obesity, post-menopausal state and sedentary lifestyle. Past treatments include angiotensin blockers and calcium channel blockers. The current treatment provides moderate improvement. Compliance problems include exercise.      Past Medical History:  Diagnosis Date  . Arthritis   . Diabetes mellitus without complication ([place])   . Gout   . Hypertension   . Partial retinal tear of both eyes without detachment 08/29/2017     Family History  Problem Relation Age of Onset  . Heart disease Mother   . Hypertension Mother   . Diabetes Mother   . Arthritis Father   . Hypertension Father   . Prostate cancer Father   . Parkinson's disease Father   . Heart disease Father   . Sinusitis Sister   . Allergic rhinitis Sister   . Diabetes Sister   . High Cholesterol Sister   . Hypertension Sister   . Diabetes Brother   . Hypertension Brother   . High Cholesterol Brother   . Hypertension Brother   . Angioedema Neg Hx   . Asthma Neg Hx   . Eczema Neg Hx   .  Immunodeficiency Neg Hx   . Urticaria Neg Hx   . Breast cancer Neg Hx      Current Outpatient Medications:  .  albuterol (VENTOLIN HFA) 108 (90 Base) MCG/ACT inhaler, Inhale into the lungs every 6 (six) hours as needed for wheezing or shortness of breath., Disp: , Rfl:  .  allopurinol (ZYLOPRIM) 300 MG tablet, TAKE 1 TABLET BY MOUTH EVERY DAY, Disp: 90 tablet, Rfl: 0 .  amLODipine (NORVASC) 5 MG tablet, TAKE 1 TABLET BY MOUTH EVERY DAY, Disp: 90 tablet, Rfl: 2 .  Apple Cider Vinegar 500 MG TABS, Take by mouth 2 (two) times a day., Disp: , Rfl:  .  Ascorbic Acid (VITAMIN C PO), Take 2,000 mg by mouth daily., Disp: , Rfl:  .  B Complex Vitamins (VITAMIN-B COMPLEX) TABS, Take by mouth., Disp: , Rfl:  .  BENFOTIAMINE PO, Take by mouth 2 (two) times daily., Disp: , Rfl:  .  Calcium Carbonate-Vit D-Min (CALCIUM 1200 PO), Take by mouth daily., Disp: , Rfl:  .  Carbinoxamine Maleate 4 MG TABS, Take 1 tablet (4 mg total) by mouth daily as needed., Disp: 56 each, Rfl: 5 .  Cholecalciferol (VITAMIN D3) 3000 units TABS, Take by mouth., Disp: , Rfl:  .  colchicine 0.6 MG tablet, Take 0.6 mg by mouth as needed., Disp: , Rfl:  .  fluticasone (FLOVENT HFA) 110 MCG/ACT inhaler, Inhale 2 puffs into the  lungs 2 (two) times daily., Disp: 1 Inhaler, Rfl: 5 .  furosemide (LASIX) 20 MG tablet, Take 1 tablet (20 mg total) by mouth daily., Disp: 90 tablet, Rfl: 1 .  Melatonin 5 MG CAPS, Take by mouth., Disp: , Rfl:  .  metoprolol succinate (TOPROL-XL) 50 MG 24 hr tablet, Take 1 tablet (50 mg total) by mouth daily. Take with or immediately following a meal., Disp: 90 tablet, Rfl: 1 .  Multiple Vitamin (MULTIVITAMIN+) LIQD, Take by mouth., Disp: , Rfl:  .  olmesartan (BENICAR) 40 MG tablet, Take 1 tablet (40 mg total) by mouth daily., Disp: 90 tablet, Rfl: 2 .  omega-3 acid ethyl esters (LOVAZA) 1 G capsule, Take 2 g by mouth 2 (two) times daily. Reported on 10/20/2015, Disp: , Rfl:  .  OZEMPIC, 0.25 OR 0.5 MG/DOSE, 2  MG/1.5ML SOPN, INJECT 0.5MG  INTO THE SKIN ONCE A WEEK, Disp: 3 pen, Rfl: 1 .  potassium chloride (MICRO-K) 10 MEQ CR capsule, TAKE 1 CAPSULE BY MOUTH EVERY DAY, Disp: 90 capsule, Rfl: 0 .  triamcinolone cream (KENALOG) 0.5 %, triamcinolone acetonide 0.5 % topical cream  APPLY A THIN LAYER TO THE AFFECTED AREA(S) 2 TIMES PER DAY AS NEEDED, Disp: , Rfl:  .  TURMERIC CURCUMIN PO, Take by mouth daily., Disp: , Rfl:  .  zinc sulfate 220 (50 Zn) MG capsule, Take 220 mg by mouth daily., Disp: , Rfl:    Allergies  Allergen Reactions  . Codeine Other (See Comments)    Hallucinations  . Hydrocodone     Nausea   . Penicillins Hives     The patient states she uses none for birth control. Last LMP was No LMP recorded. Patient has had a hysterectomy.. Negative for Dysmenorrhea  Negative for: breast discharge, breast lump(s), breast pain and breast self exam. Associated symptoms include abnormal vaginal bleeding. Pertinent negatives include abnormal bleeding (hematology), anxiety, decreased libido, depression, difficulty falling sleep, dyspareunia, history of infertility, nocturia, sexual dysfunction, sleep disturbances, urinary incontinence, urinary urgency, vaginal discharge and vaginal itching. Diet regular.    . The patient's tobacco use is:  Social History   Tobacco Use  Smoking Status Never Smoker  Smokeless Tobacco Never Used  . She has been exposed to passive smoke. The patient's alcohol use is:  Social History   Substance and Sexual Activity  Alcohol Use Yes   Comment: rarely    Review of Systems  Constitutional: Negative.   HENT: Negative.   Eyes: Negative.  Negative for blurred vision.  Respiratory: Negative.  Negative for shortness of breath.   Cardiovascular: Negative.  Negative for chest pain and palpitations.  Endocrine: Negative.   Genitourinary: Negative.   Musculoskeletal: Negative.   Skin: Negative.   Allergic/Immunologic: Negative.   Neurological: Positive for numbness.        She c/o numbness/tingling of LUE. States she recently had nerve conduction study at Ortho office.   Hematological: Negative.   Psychiatric/Behavioral: Negative.      Today's Vitals   04/16/19 1032  BP: (!) 150/82  Pulse: 70  Temp: 98.3 F (36.8 C)  TempSrc: Oral  Weight: 243 lb 6.4 oz (110.4 kg)  Height: 5' 4.8" (1.646 m)  PainSc: 6   PainLoc: Knee   Body mass index is 40.75 kg/m.   Objective:  Physical Exam Vitals and nursing note reviewed.  Constitutional:      Appearance: Normal appearance. She is obese.  HENT:     Head: Normocephalic and atraumatic.  Right Ear: Tympanic membrane, ear canal and external ear normal.     Left Ear: Tympanic membrane, ear canal and external ear normal.     Nose: Nose normal.     Mouth/Throat:     Mouth: Mucous membranes are moist.     Pharynx: Oropharynx is clear.  Eyes:     Extraocular Movements: Extraocular movements intact.     Conjunctiva/sclera: Conjunctivae normal.     Pupils: Pupils are equal, round, and reactive to light.  Neck:     Comments: Tight trapezius muscles bilaterally. Both sides tender to palpation.  Cardiovascular:     Rate and Rhythm: Normal rate and regular rhythm.     Pulses: Normal pulses.     Heart sounds: Normal heart sounds.  Pulmonary:     Effort: Pulmonary effort is normal.     Breath sounds: Normal breath sounds.  Chest:     Breasts: Tanner Score is 5.        Right: Normal.        Left: Normal.  Abdominal:     General: Bowel sounds are normal.     Palpations: Abdomen is soft.     Comments: Rounded.   Genitourinary:    Comments: deferred Musculoskeletal:        General: Normal range of motion.     Cervical back: Normal range of motion and neck supple.  Skin:    General: Skin is warm and dry.  Neurological:     General: No focal deficit present.     Mental Status: She is alert and oriented to person, place, and time.  Psychiatric:        Mood and Affect: Mood normal.         Behavior: Behavior normal.         Assessment And Plan:     1. Routine general medical examination at health care facility  A full exam was performed.  Importance of monthly self breast exams was discussed with the patient. PATIENT HAS BEEN ADVISED TO GET 30-45 MINUTES REGULAR EXERCISE NO LESS THAN FOUR TO FIVE DAYS PER WEEK - BOTH WEIGHTBEARING EXERCISES AND AEROBIC ARE RECOMMENDED.  SHE IS ADVISED TO FOLLOW A HEALTHY DIET WITH AT LEAST SIX FRUITS/VEGGIES PER DAY, DECREASE INTAKE OF RED MEAT, AND TO INCREASE FISH INTAKE TO TWO DAYS PER WEEK.  MEATS/FISH SHOULD NOT BE FRIED, BAKED OR BROILED IS PREFERABLE.  I SUGGEST WEARING SPF 50 SUNSCREEN ON EXPOSED PARTS AND ESPECIALLY WHEN IN THE DIRECT SUNLIGHT FOR AN EXTENDED PERIOD OF TIME.  PLEASE AVOID FAST FOOD RESTAURANTS AND INCREASE YOUR WATER INTAKE.   2. Essential hypertension, benign  Chronic, uncontrolled. She will continue with current meds for now. EKG performed, no new changes noted. She is encouraged to limit her salt intake.   - EKG 12-Lead - olmesartan (BENICAR) 40 MG tablet; Take 1 tablet (40 mg total) by mouth daily.  Dispense: 90 tablet; Refill: 2 - metoprolol succinate (TOPROL-XL) 50 MG 24 hr tablet; Take 1 tablet (50 mg total) by mouth daily. Take with or immediately following a meal.  Dispense: 90 tablet; Refill: 1 - Lipid panel  3. Paresthesia and pain of left extremity  Chronic. Her sx likely exacerbated by tight cervical muscles.  She agrees to [facility] evaluation. Referral placed. I will also request copy of her NCS report.   - Ambulatory referral to Chiropractic  4. Other abnormal glucose  HER A1C HAS BEEN ELEVATED IN THE PAST. I WILL CHECK AN A1C, BMET TODAY.  SHE WAS ENCOURAGED TO AVOID SUGARY BEVERAGES AND PROCESSED FOODS INCLUDNG BREADS, RICE AND PASTA.  - Hemoglobin A1c  5. Class 3 severe obesity due to excess calories with serious comorbidity and body mass index (BMI) of 40.0 to 44.9 in adult [facility])  She is  encouraged to strive for BMI less than 35 to decrease cardiac risk.     Maximino Greenland, MD    THE PATIENT IS ENCOURAGED TO PRACTICE SOCIAL DISTANCING DUE TO THE COVID-19 PANDEMIC.

## 2019-04-17 LAB — LIPID PANEL
Chol/HDL Ratio: 3.3 ratio (ref 0.0–4.4)
Cholesterol, Total: 182 mg/dL (ref 100–199)
HDL: 56 mg/dL (ref >39)
LDL Chol Calc (NIH): 115 mg/dL — ABNORMAL HIGH (ref 0–99)
Triglycerides: 58 mg/dL (ref 0–149)
VLDL Cholesterol Cal: 11 mg/dL (ref 5–40)

## 2019-04-17 LAB — HEMOGLOBIN A1C
Est. average glucose Bld gHb Est-mCnc: 117 mg/dL
Hgb A1c MFr Bld: 5.7 % — ABNORMAL HIGH (ref 4.8–5.6)

## 2019-04-20 ENCOUNTER — Encounter: Payer: Self-pay | Admitting: Internal Medicine

## 2019-04-21 ENCOUNTER — Telehealth: Payer: Self-pay | Admitting: Rheumatology

## 2019-04-21 NOTE — Telephone Encounter (Signed)
-----   Message from Shona Needles, RT sent at 04/08/2019  3:00 PM EST ----- Regarding: 6 MONTH F/U

## 2019-04-21 NOTE — Telephone Encounter (Signed)
I LMOM for patient to call, and schedule a follow up appointment for around 10/02/2019 with Hazel Sams, PAC.

## 2019-04-22 ENCOUNTER — Telehealth: Payer: Self-pay

## 2019-05-12 ENCOUNTER — Other Ambulatory Visit: Payer: Self-pay | Admitting: Rheumatology

## 2019-05-12 NOTE — Telephone Encounter (Signed)
Last Visit: 04/08/2019 telemedicine  Next Visit: message sent to the front desk to schedule.  Labs: 01/16/2019 stable  Uric acid: 01/16/2019 5.0  Okay to refill per Dr. Estanislado Pandy.

## 2019-05-13 ENCOUNTER — Ambulatory Visit: Payer: Medicare Other

## 2019-05-13 ENCOUNTER — Other Ambulatory Visit: Payer: Self-pay

## 2019-05-13 ENCOUNTER — Telehealth: Payer: Self-pay

## 2019-05-13 VITALS — BP 148/92 | HR 74 | Temp 98.2°F | Wt 247.0 lb

## 2019-05-13 DIAGNOSIS — I10 Essential (primary) hypertension: Secondary | ICD-10-CM

## 2019-05-13 MED ORDER — HYDRALAZINE HCL 25 MG PO TABS: 25.0000 mg | ORAL_TABLET | Freq: Two times a day (BID) | ORAL | 1 refills | Status: DC

## 2019-05-13 NOTE — Progress Notes (Signed)
Pt presents today for b/p check 142/92 Provider aware  Per Dr.Sanders: We can cut back to 5mg  amlodipine. But I need to add a new medication. Add hydralazine 25mg  twice daily - breakfast and dinner. We must get BP down  Per Dr. Baird Cancer: she can take an extra furosemide if her swelling persists she can take an extra one today to help swelling go down. i need to know if it doesn't work

## 2019-05-13 NOTE — Telephone Encounter (Signed)
LVM and Mychart message giving patient a 3wk f/u appt for her blood pressure

## 2019-05-16 DIAGNOSIS — M47812 Spondylosis without myelopathy or radiculopathy, cervical region: Secondary | ICD-10-CM | POA: Diagnosis not present

## 2019-05-16 DIAGNOSIS — M2480 Other specific joint derangements of unspecified joint, not elsewhere classified: Secondary | ICD-10-CM | POA: Diagnosis not present

## 2019-05-16 DIAGNOSIS — M4726 Other spondylosis with radiculopathy, lumbar region: Secondary | ICD-10-CM | POA: Diagnosis not present

## 2019-05-16 DIAGNOSIS — M4727 Other spondylosis with radiculopathy, lumbosacral region: Secondary | ICD-10-CM | POA: Diagnosis not present

## 2019-05-16 DIAGNOSIS — M5441 Lumbago with sciatica, right side: Secondary | ICD-10-CM | POA: Diagnosis not present

## 2019-05-16 DIAGNOSIS — M533 Sacrococcygeal disorders, not elsewhere classified: Secondary | ICD-10-CM | POA: Diagnosis not present

## 2019-05-23 DIAGNOSIS — Z20828 Contact with and (suspected) exposure to other viral communicable diseases: Secondary | ICD-10-CM | POA: Diagnosis not present

## 2019-05-28 ENCOUNTER — Telehealth: Payer: Self-pay

## 2019-05-28 NOTE — Telephone Encounter (Signed)
Pt called b/p still running high this morning 166/98 has been running 160 top number. Still having swelling around her ankles not as bad as it was before but still swelling. Just wanted to give you a heads up before her next appt  First thing in the morning  furosemide 20mg , olmesartan 40mg , and metoprolol 50mg    hyraalazine 25mg  at breakfast and dinner  At night amlodipine 5mg 

## 2019-05-30 ENCOUNTER — Telehealth: Payer: Self-pay | Admitting: Rheumatology

## 2019-05-30 MED ORDER — ALLOPURINOL 300 MG PO TABS: 300.0000 mg | ORAL_TABLET | Freq: Every day | ORAL | 0 refills | Status: DC

## 2019-05-30 NOTE — Telephone Encounter (Signed)
Patient request a refill on Allopurinol sent to [facility]  on [address]

## 2019-05-30 NOTE — Telephone Encounter (Signed)
Last Visit: 04/08/2019 telemedicine  Next Visit: due June 2021.  Labs: 01/16/2019 stable  Uric acid: 01/16/2019 5.0  Okay to refill per Dr. Estanislado Pandy

## 2019-06-03 ENCOUNTER — Other Ambulatory Visit: Payer: Self-pay

## 2019-06-03 ENCOUNTER — Encounter: Payer: Self-pay | Admitting: Internal Medicine

## 2019-06-03 ENCOUNTER — Ambulatory Visit (INDEPENDENT_AMBULATORY_CARE_PROVIDER_SITE_OTHER): Payer: Medicare Other | Admitting: Internal Medicine

## 2019-06-03 ENCOUNTER — Ambulatory Visit: Payer: Self-pay | Admitting: Internal Medicine

## 2019-06-03 VITALS — BP 126/84 | HR 71 | Temp 98.5°F | Ht 64.8 in | Wt 248.8 lb

## 2019-06-03 DIAGNOSIS — I10 Essential (primary) hypertension: Secondary | ICD-10-CM

## 2019-06-03 DIAGNOSIS — Z6841 Body Mass Index (BMI) 40.0 and over, adult: Secondary | ICD-10-CM

## 2019-06-03 NOTE — Progress Notes (Signed)
This visit occurred during the SARS-CoV-2 public health emergency.  Safety protocols were in place, including screening questions prior to the visit, additional usage of staff PPE, and extensive cleaning of exam room while observing appropriate contact time as indicated for disinfecting solutions.  Subjective:     Patient ID: Robin Odom , female    DOB: 1950/12/04 , 69 y.o.   MRN: [id number]   Chief Complaint  Patient presents with  . Hypertension    HPI  She is here today for a bp check. She was started on hydralazine 25mg  twice daily at her last visit. She has tolerated the medication without any side effects. She is concerned b/c her BP readings are still high at home. She is upset b/c she left BP cuff at home, she wanted to have it checked here for accuracy. She denies headaches, chest pains and/or palpitations. She has also been taking magnesium nightly. She is traveling to [facility] tomorrow to spend time with her brother and SIL.   Hypertension This is a chronic problem. The current episode started more than 1 year ago. The problem has been gradually improving since onset. Risk factors for coronary artery disease include obesity and post-menopausal state. Past treatments include direct vasodilators, angiotensin blockers and diuretics.     Past Medical History:  Diagnosis Date  . Arthritis   . Diabetes mellitus without complication ([place])   . Gout   . Hypertension   . Partial retinal tear of both eyes without detachment 08/29/2017     Family History  Problem Relation Age of Onset  . Heart disease Mother   . Hypertension Mother   . Diabetes Mother   . Arthritis Father   . Hypertension Father   . Prostate cancer Father   . Parkinson's disease Father   . Heart disease Father   . Sinusitis Sister   . Allergic rhinitis Sister   . Diabetes Sister   . High Cholesterol Sister   . Hypertension Sister   . Diabetes Brother   . Hypertension Brother   . High Cholesterol Brother    . Hypertension Brother   . Angioedema Neg Hx   . Asthma Neg Hx   . Eczema Neg Hx   . Immunodeficiency Neg Hx   . Urticaria Neg Hx   . Breast cancer Neg Hx      Current Outpatient Medications:  .  albuterol (VENTOLIN HFA) 108 (90 Base) MCG/ACT inhaler, Inhale into the lungs every 6 (six) hours as needed for wheezing or shortness of breath., Disp: , Rfl:  .  allopurinol (ZYLOPRIM) 300 MG tablet, Take 1 tablet (300 mg total) by mouth daily., Disp: 90 tablet, Rfl: 0 .  amLODipine (NORVASC) 5 MG tablet, TAKE 1 TABLET BY MOUTH EVERY DAY, Disp: 90 tablet, Rfl: 2 .  Apple Cider Vinegar 500 MG TABS, Take by mouth 2 (two) times a day., Disp: , Rfl:  .  Ascorbic Acid (VITAMIN C PO), Take 2,000 mg by mouth daily., Disp: , Rfl:  .  B Complex Vitamins (VITAMIN-B COMPLEX) TABS, Take by mouth., Disp: , Rfl:  .  BENFOTIAMINE PO, Take by mouth 2 (two) times daily., Disp: , Rfl:  .  Calcium Carbonate-Vit D-Min (CALCIUM 1200 PO), Take by mouth daily., Disp: , Rfl:  .  Carbinoxamine Maleate 4 MG TABS, Take 1 tablet (4 mg total) by mouth daily as needed., Disp: 56 each, Rfl: 5 .  Cholecalciferol (VITAMIN D3) 3000 units TABS, Take by mouth., Disp: , Rfl:  .  colchicine 0.6 MG tablet, Take 0.6 mg by mouth as needed., Disp: , Rfl:  .  fluticasone (FLOVENT HFA) 110 MCG/ACT inhaler, Inhale 2 puffs into the lungs 2 (two) times daily., Disp: 1 Inhaler, Rfl: 5 .  furosemide (LASIX) 20 MG tablet, Take 1 tablet (20 mg total) by mouth daily., Disp: 90 tablet, Rfl: 1 .  hydrALAZINE (APRESOLINE) 25 MG tablet, Take 1 tablet (25 mg total) by mouth 2 (two) times daily., Disp: 60 tablet, Rfl: 1 .  Melatonin 5 MG CAPS, Take by mouth., Disp: , Rfl:  .  metoprolol succinate (TOPROL-XL) 50 MG 24 hr tablet, Take 1 tablet (50 mg total) by mouth daily. Take with or immediately following a meal., Disp: 90 tablet, Rfl: 1 .  Multiple Vitamin (MULTIVITAMIN+) LIQD, Take by mouth., Disp: , Rfl:  .  olmesartan (BENICAR) 40 MG tablet, Take  1 tablet (40 mg total) by mouth daily., Disp: 90 tablet, Rfl: 2 .  omega-3 acid ethyl esters (LOVAZA) 1 G capsule, Take 2 g by mouth 2 (two) times daily. Reported on 10/20/2015, Disp: , Rfl:  .  OZEMPIC, 0.25 OR 0.5 MG/DOSE, 2 MG/1.5ML SOPN, INJECT 0.5MG  INTO THE SKIN ONCE A WEEK, Disp: 3 pen, Rfl: 1 .  potassium chloride (MICRO-K) 10 MEQ CR capsule, TAKE 1 CAPSULE BY MOUTH EVERY DAY, Disp: 90 capsule, Rfl: 0 .  triamcinolone cream (KENALOG) 0.5 %, triamcinolone acetonide 0.5 % topical cream  APPLY A THIN LAYER TO THE AFFECTED AREA(S) 2 TIMES PER DAY AS NEEDED, Disp: , Rfl:  .  TURMERIC CURCUMIN PO, Take by mouth daily., Disp: , Rfl:  .  zinc sulfate 220 (50 Zn) MG capsule, Take 220 mg by mouth daily., Disp: , Rfl:    Allergies  Allergen Reactions  . Codeine Other (See Comments)    Hallucinations  . Hydrocodone     Nausea   . Penicillins Hives     Review of Systems  Constitutional: Negative.   Respiratory: Negative.   Cardiovascular: Negative.   Gastrointestinal: Negative.   Neurological: Negative.   Psychiatric/Behavioral: Negative.      Today's Vitals   06/03/19 1430  BP: 126/84  Pulse: 71  Temp: 98.5 F (36.9 C)  TempSrc: Oral  Weight: 248 lb 12.8 oz (112.9 kg)  Height: 5' 4.8" (1.646 m)  PainSc: 6   PainLoc: Generalized   Body mass index is 41.66 kg/m.   Objective:  Physical Exam Vitals and nursing note reviewed.  Constitutional:      Appearance: Normal appearance. She is obese.  HENT:     Head: Normocephalic and atraumatic.  Cardiovascular:     Rate and Rhythm: Normal rate and regular rhythm.     Heart sounds: Normal heart sounds.  Pulmonary:     Effort: Pulmonary effort is normal.     Breath sounds: Normal breath sounds.  Skin:    General: Skin is warm.  Neurological:     General: No focal deficit present.     Mental Status: She is alert.  Psychiatric:        Mood and Affect: Mood normal.        Behavior: Behavior normal.         Assessment And  Plan:     1. Essential hypertension, benign  Chronic, today's reading is well-controlled. She is advised to check BP at different times of day and to keep a log. She agrees to send these readings in Mychart. She will continue with current meds for now. She plans  to return in March for her previously scheduled visit.   2. Class 3 severe obesity due to excess calories with serious comorbidity and body mass index (BMI) of 40.0 to 44.9 in adult ([facility])  BMI 41. She is encouraged to continue with her regular exercise regimen. Her initial goal is to lose ten percent of her body weight to decrease cardiac risk.   Maximino Greenland, MD    THE PATIENT IS ENCOURAGED TO PRACTICE SOCIAL DISTANCING DUE TO THE COVID-19 PANDEMIC.

## 2019-06-03 NOTE — Patient Instructions (Signed)
DASH Eating Plan DASH stands for "Dietary Approaches to Stop Hypertension." The DASH eating plan is a healthy eating plan that has been shown to reduce high blood pressure (hypertension). It may also reduce your risk for type 2 diabetes, heart disease, and stroke. The DASH eating plan may also help with weight loss. What are tips for following this plan?  General guidelines  Avoid eating more than 2,300 mg (milligrams) of salt (sodium) a day. If you have hypertension, you may need to reduce your sodium intake to 1,500 mg a day.  Limit alcohol intake to no more than 1 drink a day for nonpregnant women and 2 drinks a day for men. One drink equals 12 oz of beer, 5 oz of wine, or 1 oz of hard liquor.  Work with your health care provider to maintain a healthy body weight or to lose weight. Ask what an ideal weight is for you.  Get at least 30 minutes of exercise that causes your heart to beat faster (aerobic exercise) most days of the week. Activities may include walking, swimming, or biking.  Work with your health care provider or diet and nutrition specialist (dietitian) to adjust your eating plan to your individual calorie needs. Reading food labels   Check food labels for the amount of sodium per serving. Choose foods with less than 5 percent of the Daily Value of sodium. Generally, foods with less than 300 mg of sodium per serving fit into this eating plan.  To find whole grains, look for the word "whole" as the first word in the ingredient list. Shopping  Buy products labeled as "low-sodium" or "no salt added."  Buy fresh foods. Avoid canned foods and premade or frozen meals. Cooking  Avoid adding salt when cooking. Use salt-free seasonings or herbs instead of table salt or sea salt. Check with your health care provider or pharmacist before using salt substitutes.  Do not fry foods. Cook foods using healthy methods such as baking, boiling, grilling, and broiling instead.  Cook with  heart-healthy oils, such as olive, canola, soybean, or sunflower oil. Meal planning  Eat a balanced diet that includes: ? 5 or more servings of fruits and vegetables each day. At each meal, try to fill half of your plate with fruits and vegetables. ? Up to 6-8 servings of whole grains each day. ? Less than 6 oz of lean meat, poultry, or fish each day. A 3-oz serving of meat is about the same size as a deck of cards. One egg equals 1 oz. ? 2 servings of low-fat dairy each day. ? A serving of nuts, seeds, or beans 5 times each week. ? Heart-healthy fats. Healthy fats called Omega-3 fatty acids are found in foods such as flaxseeds and coldwater fish, like sardines, salmon, and mackerel.  Limit how much you eat of the following: ? Canned or prepackaged foods. ? Food that is high in trans fat, such as fried foods. ? Food that is high in saturated fat, such as fatty meat. ? Sweets, desserts, sugary drinks, and other foods with added sugar. ? Full-fat dairy products.  Do not salt foods before eating.  Try to eat at least 2 vegetarian meals each week.  Eat more home-cooked food and less restaurant, buffet, and fast food.  When eating at a restaurant, ask that your food be prepared with less salt or no salt, if possible. What foods are recommended? The items listed may not be a complete list. Talk with your dietitian about   what dietary choices are best for you. Grains Whole-grain or whole-wheat bread. Whole-grain or whole-wheat pasta. Brown rice. Oatmeal. Quinoa. Bulgur. Whole-grain and low-sodium cereals. Pita bread. Low-fat, low-sodium crackers. Whole-wheat flour tortillas. Vegetables Fresh or frozen vegetables (raw, steamed, roasted, or grilled). Low-sodium or reduced-sodium tomato and vegetable juice. Low-sodium or reduced-sodium tomato sauce and tomato paste. Low-sodium or reduced-sodium canned vegetables. Fruits All fresh, dried, or frozen fruit. Canned fruit in natural juice (without  added sugar). Meat and other protein foods Skinless chicken or turkey. Ground chicken or turkey. Pork with fat trimmed off. Fish and seafood. Egg whites. Dried beans, peas, or lentils. Unsalted nuts, nut butters, and seeds. Unsalted canned beans. Lean cuts of beef with fat trimmed off. Low-sodium, lean deli meat. Dairy Low-fat (1%) or fat-free (skim) milk. Fat-free, low-fat, or reduced-fat cheeses. Nonfat, low-sodium ricotta or cottage cheese. Low-fat or nonfat yogurt. Low-fat, low-sodium cheese. Fats and oils Soft margarine without trans fats. Vegetable oil. Low-fat, reduced-fat, or light mayonnaise and salad dressings (reduced-sodium). Canola, safflower, olive, soybean, and sunflower oils. Avocado. Seasoning and other foods Herbs. Spices. Seasoning mixes without salt. Unsalted popcorn and pretzels. Fat-free sweets. What foods are not recommended? The items listed may not be a complete list. Talk with your dietitian about what dietary choices are best for you. Grains Baked goods made with fat, such as croissants, muffins, or some breads. Dry pasta or rice meal packs. Vegetables Creamed or fried vegetables. Vegetables in a cheese sauce. Regular canned vegetables (not low-sodium or reduced-sodium). Regular canned tomato sauce and paste (not low-sodium or reduced-sodium). Regular tomato and vegetable juice (not low-sodium or reduced-sodium). Pickles. Olives. Fruits Canned fruit in a light or heavy syrup. Fried fruit. Fruit in cream or butter sauce. Meat and other protein foods Fatty cuts of meat. Ribs. Fried meat. Bacon. Sausage. Bologna and other processed lunch meats. Salami. Fatback. Hotdogs. Bratwurst. Salted nuts and seeds. Canned beans with added salt. Canned or smoked fish. Whole eggs or egg yolks. Chicken or turkey with skin. Dairy Whole or 2% milk, cream, and half-and-half. Whole or full-fat cream cheese. Whole-fat or sweetened yogurt. Full-fat cheese. Nondairy creamers. Whipped toppings.  Processed cheese and cheese spreads. Fats and oils Butter. Stick margarine. Lard. Shortening. Ghee. Bacon fat. Tropical oils, such as coconut, palm kernel, or palm oil. Seasoning and other foods Salted popcorn and pretzels. Onion salt, garlic salt, seasoned salt, table salt, and sea salt. Worcestershire sauce. Tartar sauce. Barbecue sauce. Teriyaki sauce. Soy sauce, including reduced-sodium. Steak sauce. Canned and packaged gravies. Fish sauce. Oyster sauce. Cocktail sauce. Horseradish that you find on the shelf. Ketchup. Mustard. Meat flavorings and tenderizers. Bouillon cubes. Hot sauce and Tabasco sauce. Premade or packaged marinades. Premade or packaged taco seasonings. Relishes. Regular salad dressings. Where to find more information:  National Heart, Lung, and Blood Institute: www.nhlbi.nih.gov  American Heart Association: www.heart.org Summary  The DASH eating plan is a healthy eating plan that has been shown to reduce high blood pressure (hypertension). It may also reduce your risk for type 2 diabetes, heart disease, and stroke.  With the DASH eating plan, you should limit salt (sodium) intake to 2,300 mg a day. If you have hypertension, you may need to reduce your sodium intake to 1,500 mg a day.  When on the DASH eating plan, aim to eat more fresh fruits and vegetables, whole grains, lean proteins, low-fat dairy, and heart-healthy fats.  Work with your health care provider or diet and nutrition specialist (dietitian) to adjust your eating plan to your   individual calorie needs. This information is not intended to replace advice given to you by your health care provider. Make sure you discuss any questions you have with your health care provider. Document Revised: 03/30/2017 Document Reviewed: 04/10/2016 Elsevier Patient Education  2020 Elsevier Inc.  

## 2019-06-04 ENCOUNTER — Other Ambulatory Visit: Payer: Self-pay | Admitting: Internal Medicine

## 2019-06-12 ENCOUNTER — Encounter: Payer: Self-pay | Admitting: Internal Medicine

## 2019-06-13 DIAGNOSIS — Z20828 Contact with and (suspected) exposure to other viral communicable diseases: Secondary | ICD-10-CM | POA: Diagnosis not present

## 2019-06-29 ENCOUNTER — Other Ambulatory Visit: Payer: Self-pay | Admitting: Internal Medicine

## 2019-06-30 ENCOUNTER — Telehealth: Payer: Medicare Other

## 2019-06-30 ENCOUNTER — Ambulatory Visit: Payer: Self-pay

## 2019-06-30 ENCOUNTER — Other Ambulatory Visit: Payer: Self-pay

## 2019-06-30 DIAGNOSIS — I10 Essential (primary) hypertension: Secondary | ICD-10-CM

## 2019-06-30 DIAGNOSIS — Z6841 Body Mass Index (BMI) 40.0 and over, adult: Secondary | ICD-10-CM

## 2019-07-01 NOTE — Chronic Care Management (AMB) (Signed)
  Chronic Care Management   Outreach Note  07/01/2019 Name: Robin Odom MRN: [id number] DOB: [birth year]  Referred by: Glendale Chard, MD Reason for referral : Chronic Care Management (RQ Initial Call - HTN, Obesity )   An unsuccessful telephone outreach was attempted today. The patient was referred to the case management team for assistance with care management and care coordination.   Follow Up Plan: A HIPPA compliant phone message was left for the patient providing contact information and requesting a return call.  Telephone follow up appointment with care management team member scheduled for: 07/22/19  Barb Merino, RN, BSN, CCM Care Management Coordinator [place] [facility]  Direct Phone: [phone]

## 2019-07-04 ENCOUNTER — Other Ambulatory Visit: Payer: Self-pay | Admitting: Internal Medicine

## 2019-07-07 DIAGNOSIS — H35042 Retinal micro-aneurysms, unspecified, left eye: Secondary | ICD-10-CM | POA: Diagnosis not present

## 2019-07-07 DIAGNOSIS — H348322 Tributary (branch) retinal vein occlusion, left eye, stable: Secondary | ICD-10-CM | POA: Diagnosis not present

## 2019-07-07 DIAGNOSIS — H35033 Hypertensive retinopathy, bilateral: Secondary | ICD-10-CM | POA: Diagnosis not present

## 2019-07-07 DIAGNOSIS — H43813 Vitreous degeneration, bilateral: Secondary | ICD-10-CM | POA: Diagnosis not present

## 2019-07-15 ENCOUNTER — Ambulatory Visit (INDEPENDENT_AMBULATORY_CARE_PROVIDER_SITE_OTHER): Payer: Medicare Other | Admitting: Internal Medicine

## 2019-07-15 ENCOUNTER — Other Ambulatory Visit: Payer: Self-pay

## 2019-07-15 ENCOUNTER — Encounter: Payer: Self-pay | Admitting: Internal Medicine

## 2019-07-15 VITALS — BP 118/66 | HR 81 | Temp 98.4°F | Ht 64.8 in | Wt 246.6 lb

## 2019-07-15 DIAGNOSIS — R7303 Prediabetes: Secondary | ICD-10-CM

## 2019-07-15 DIAGNOSIS — R252 Cramp and spasm: Secondary | ICD-10-CM | POA: Diagnosis not present

## 2019-07-15 DIAGNOSIS — I10 Essential (primary) hypertension: Secondary | ICD-10-CM | POA: Diagnosis not present

## 2019-07-15 DIAGNOSIS — Z79899 Other long term (current) drug therapy: Secondary | ICD-10-CM | POA: Diagnosis not present

## 2019-07-15 DIAGNOSIS — Z6841 Body Mass Index (BMI) 40.0 and over, adult: Secondary | ICD-10-CM | POA: Diagnosis not present

## 2019-07-15 NOTE — Progress Notes (Signed)
This visit occurred during the SARS-CoV-2 public health emergency.  Safety protocols were in place, including screening questions prior to the visit, additional usage of staff PPE, and extensive cleaning of exam room while observing appropriate contact time as indicated for disinfecting solutions.  Subjective:     Patient ID: Robin Odom , female    DOB: 1950/08/22 , 69 y.o.   MRN: [id number]   Chief Complaint  Patient presents with  . Hypertension    HPI  She is here today for BP check. Feels well on current meds. She has brought in BP log from home. Blood pressures range from 121/74-154/94.  Hypertension This is a chronic problem. The current episode started more than 1 year ago. The problem has been gradually improving since onset. The problem is controlled. Pertinent negatives include no blurred vision, chest pain, palpitations or shortness of breath.     Past Medical History:  Diagnosis Date  . Arthritis   . Diabetes mellitus without complication ([place])   . Gout   . Hypertension   . Partial retinal tear of both eyes without detachment 08/29/2017     Family History  Problem Relation Age of Onset  . Heart disease Mother   . Hypertension Mother   . Diabetes Mother   . Arthritis Father   . Hypertension Father   . Prostate cancer Father   . Parkinson's disease Father   . Heart disease Father   . Sinusitis Sister   . Allergic rhinitis Sister   . Diabetes Sister   . High Cholesterol Sister   . Hypertension Sister   . Diabetes Brother   . Hypertension Brother   . High Cholesterol Brother   . Hypertension Brother   . Angioedema Neg Hx   . Asthma Neg Hx   . Eczema Neg Hx   . Immunodeficiency Neg Hx   . Urticaria Neg Hx   . Breast cancer Neg Hx      Current Outpatient Medications:  .  albuterol (VENTOLIN HFA) 108 (90 Base) MCG/ACT inhaler, Inhale into the lungs every 6 (six) hours as needed for wheezing or shortness of breath., Disp: , Rfl:  .  allopurinol (ZYLOPRIM)  300 MG tablet, Take 1 tablet (300 mg total) by mouth daily., Disp: 90 tablet, Rfl: 0 .  amLODipine (NORVASC) 5 MG tablet, TAKE 1 TABLET BY MOUTH EVERY DAY, Disp: 90 tablet, Rfl: 2 .  Apple Cider Vinegar 500 MG TABS, Take by mouth 2 (two) times a day., Disp: , Rfl:  .  Ascorbic Acid (VITAMIN C PO), Take 2,000 mg by mouth daily., Disp: , Rfl:  .  B Complex Vitamins (VITAMIN-B COMPLEX) TABS, Take by mouth., Disp: , Rfl:  .  BENFOTIAMINE PO, Take by mouth 2 (two) times daily., Disp: , Rfl:  .  Calcium Carbonate-Vit D-Min (CALCIUM 1200 PO), Take by mouth daily., Disp: , Rfl:  .  Carbinoxamine Maleate 4 MG TABS, Take 1 tablet (4 mg total) by mouth daily as needed., Disp: 56 each, Rfl: 5 .  Cholecalciferol (VITAMIN D3) 3000 units TABS, Take by mouth., Disp: , Rfl:  .  colchicine 0.6 MG tablet, Take 0.6 mg by mouth as needed., Disp: , Rfl:  .  fluticasone (FLOVENT HFA) 110 MCG/ACT inhaler, Inhale 2 puffs into the lungs 2 (two) times daily., Disp: 1 Inhaler, Rfl: 5 .  furosemide (LASIX) 20 MG tablet, Take 1 tablet (20 mg total) by mouth daily., Disp: 90 tablet, Rfl: 1 .  hydrALAZINE (APRESOLINE) 25 MG tablet, TAKE  1 TABLET BY MOUTH TWICE A DAY, Disp: 60 tablet, Rfl: 1 .  Melatonin 5 MG CAPS, Take by mouth., Disp: , Rfl:  .  metoprolol succinate (TOPROL-XL) 50 MG 24 hr tablet, Take 1 tablet (50 mg total) by mouth daily. Take with or immediately following a meal., Disp: 90 tablet, Rfl: 1 .  Multiple Vitamin (MULTIVITAMIN+) LIQD, Take by mouth., Disp: , Rfl:  .  olmesartan (BENICAR) 40 MG tablet, Take 1 tablet (40 mg total) by mouth daily., Disp: 90 tablet, Rfl: 2 .  omega-3 acid ethyl esters (LOVAZA) 1 G capsule, Take 2 g by mouth 2 (two) times daily. Reported on 10/20/2015, Disp: , Rfl:  .  OZEMPIC, 0.25 OR 0.5 MG/DOSE, 2 MG/1.5ML SOPN, INJECT 0.5MG INTO THE SKIN ONCE A WEEK, Disp: 3 pen, Rfl: 1 .  potassium chloride (MICRO-K) 10 MEQ CR capsule, TAKE 1 CAPSULE BY MOUTH EVERY DAY, Disp: 90 capsule, Rfl: 0 .   triamcinolone cream (KENALOG) 0.5 %, triamcinolone acetonide 0.5 % topical cream  APPLY A THIN LAYER TO THE AFFECTED AREA(S) 2 TIMES PER DAY AS NEEDED, Disp: , Rfl:  .  TURMERIC CURCUMIN PO, Take by mouth daily., Disp: , Rfl:  .  zinc sulfate 220 (50 Zn) MG capsule, Take 220 mg by mouth daily., Disp: , Rfl:    Allergies  Allergen Reactions  . Codeine Other (See Comments)    Hallucinations  . Hydrocodone     Nausea   . Penicillins Hives     Review of Systems  Constitutional: Negative.   Eyes: Negative for blurred vision.  Respiratory: Negative.  Negative for shortness of breath.   Cardiovascular: Negative.  Negative for chest pain and palpitations.  Gastrointestinal: Negative.   Musculoskeletal: Positive for myalgias.       She c/o muscle cramps, mostly severe. Often awaken her from sleep and occur right before she falls asleep. No relief with tonic water and mustard. Occur frequently.   Neurological: Negative.   Psychiatric/Behavioral: Negative.      Today's Vitals   07/15/19 1131  BP: 118/66  Pulse: 81  Temp: 98.4 F (36.9 C)  TempSrc: Oral  Weight: 246 lb 9.6 oz (111.9 kg)  Height: 5' 4.8" (1.646 m)  PainSc: 3   PainLoc: Knee   Body mass index is 41.29 kg/m.   Objective:  Physical Exam Vitals and nursing note reviewed.  Constitutional:      Appearance: Normal appearance. She is obese.  HENT:     Head: Normocephalic and atraumatic.  Cardiovascular:     Rate and Rhythm: Normal rate and regular rhythm.     Heart sounds: Normal heart sounds.  Pulmonary:     Effort: Pulmonary effort is normal.     Breath sounds: Normal breath sounds.  Skin:    General: Skin is warm.  Neurological:     General: No focal deficit present.     Mental Status: She is alert.  Psychiatric:        Mood and Affect: Mood normal.        Behavior: Behavior normal.         Assessment And Plan:     1. Essential hypertension, benign  Chronic, improved control.   She will continue  with current meds. I have also reviewed BP log she brought in from home.   2. Prediabetes  HER A1C HAS BEEN ELEVATED IN THE PAST. I WILL CHECK AN A1C, BMET TODAY. SHE WAS ENCOURAGED TO AVOID SUGARY BEVERAGES AND PROCESSED FOODS INCLUDNG  BREADS, RICE AND PASTA.  - Hemoglobin A1c  3. Muscle cramps  She is advised to increase her magnesium to twice daily. I will check magnesium level today. She is also advised to incorporate some coconut water into her diet.   - Magnesium  4. Drug therapy  - CBC no Diff - CMP14+EGFR  5. Class 3 severe obesity due to excess calories with serious comorbidity and body mass index (BMI) of 40.0 to 44.9 in adult (HCC)  BMI 41. She is encouraged to strive for BMI less than 35 to decrease cardiac risk  Maximino Greenland, MD    THE PATIENT IS ENCOURAGED TO PRACTICE SOCIAL DISTANCING DUE TO THE COVID-19 PANDEMIC.

## 2019-07-16 LAB — CMP14+EGFR
ALT: 22 IU/L (ref 0–32)
AST: 26 IU/L (ref 0–40)
Albumin/Globulin Ratio: 1.8 (ref 1.2–2.2)
Albumin: 4.6 g/dL (ref 3.8–4.8)
Alkaline Phosphatase: 79 IU/L (ref 39–117)
BUN/Creatinine Ratio: 21 (ref 12–28)
BUN: 14 mg/dL (ref 8–27)
Bilirubin Total: 0.3 mg/dL (ref 0.0–1.2)
CO2: 24 mmol/L (ref 20–29)
Calcium: 9.6 mg/dL (ref 8.7–10.3)
Chloride: 105 mmol/L (ref 96–106)
Creatinine, Ser: 0.67 mg/dL (ref 0.57–1.00)
GFR calc Af Amer: 104 mL/min/{1.73_m2} (ref >59)
GFR calc non Af Amer: 90 mL/min/{1.73_m2} (ref >59)
Globulin, Total: 2.5 g/dL (ref 1.5–4.5)
Glucose: 110 mg/dL — ABNORMAL HIGH (ref 65–99)
Potassium: 3.9 mmol/L (ref 3.5–5.2)
Sodium: 144 mmol/L (ref 134–144)
Total Protein: 7.1 g/dL (ref 6.0–8.5)

## 2019-07-16 LAB — CBC
Hematocrit: 40.3 % (ref 34.0–46.6)
Hemoglobin: 13.1 g/dL (ref 11.1–15.9)
MCH: 26.5 pg — ABNORMAL LOW (ref 26.6–33.0)
MCHC: 32.5 g/dL (ref 31.5–35.7)
MCV: 81 fL (ref 79–97)
Platelets: 330 10*3/uL (ref 150–450)
RBC: 4.95 x10E6/uL (ref 3.77–5.28)
RDW: 14.7 % (ref 11.7–15.4)
WBC: 9.9 10*3/uL (ref 3.4–10.8)

## 2019-07-16 LAB — HEMOGLOBIN A1C
Est. average glucose Bld gHb Est-mCnc: 123 mg/dL
Hgb A1c MFr Bld: 5.9 % — ABNORMAL HIGH (ref 4.8–5.6)

## 2019-07-16 LAB — MAGNESIUM: Magnesium: 1.9 mg/dL (ref 1.6–2.3)

## 2019-07-17 ENCOUNTER — Other Ambulatory Visit: Payer: Self-pay | Admitting: *Deleted

## 2019-07-17 DIAGNOSIS — Z5181 Encounter for therapeutic drug level monitoring: Secondary | ICD-10-CM

## 2019-07-17 DIAGNOSIS — M1A09X0 Idiopathic chronic gout, multiple sites, without tophus (tophi): Secondary | ICD-10-CM

## 2019-07-21 ENCOUNTER — Other Ambulatory Visit: Payer: Self-pay

## 2019-07-21 ENCOUNTER — Encounter: Payer: Self-pay | Admitting: Internal Medicine

## 2019-07-21 MED ORDER — FUROSEMIDE 20 MG PO TABS: 20.0000 mg | ORAL_TABLET | Freq: Every day | ORAL | 1 refills | Status: DC

## 2019-07-22 ENCOUNTER — Other Ambulatory Visit: Payer: Self-pay

## 2019-07-22 ENCOUNTER — Ambulatory Visit: Payer: Self-pay

## 2019-07-22 ENCOUNTER — Telehealth: Payer: Medicare Other

## 2019-07-22 DIAGNOSIS — I10 Essential (primary) hypertension: Secondary | ICD-10-CM

## 2019-07-23 NOTE — Chronic Care Management (AMB) (Signed)
  Chronic Care Management   Outreach Note  07/23/2019 Name: Robin Odom MRN: [id number] DOB: 1950-11-05  Referred by: Glendale Chard, MD Reason for referral : Chronic Care Management (RQ #2 Initial Call - HTN/Obesity/OA)   A second unsuccessful telephone outreach was attempted today. The patient was referred to the case management team for assistance with care management and care coordination.   Follow Up Plan: A HIPPA compliant phone message was left for the patient providing contact information and requesting a return call.  Telephone follow up appointment with care management team member scheduled for:08/05/19  Barb Merino, RN, BSN, CCM Care Management Coordinator [place] [facility]  Direct Phone: [phone]

## 2019-07-24 ENCOUNTER — Encounter: Payer: Self-pay | Admitting: Internal Medicine

## 2019-07-27 ENCOUNTER — Other Ambulatory Visit: Payer: Self-pay | Admitting: Internal Medicine

## 2019-07-28 ENCOUNTER — Other Ambulatory Visit: Payer: Self-pay

## 2019-07-29 ENCOUNTER — Other Ambulatory Visit: Payer: Self-pay | Admitting: *Deleted

## 2019-07-29 DIAGNOSIS — Z5181 Encounter for therapeutic drug level monitoring: Secondary | ICD-10-CM | POA: Diagnosis not present

## 2019-07-29 DIAGNOSIS — M1A09X0 Idiopathic chronic gout, multiple sites, without tophus (tophi): Secondary | ICD-10-CM | POA: Diagnosis not present

## 2019-07-30 LAB — CBC WITH DIFFERENTIAL/PLATELET
Absolute Monocytes: 632 cells/uL (ref 200–950)
Basophils Absolute: 74 cells/uL (ref 0–200)
Basophils Relative: 0.8 %
Eosinophils Absolute: 167 cells/uL (ref 15–500)
Eosinophils Relative: 1.8 %
HCT: 38.1 % (ref 35.0–45.0)
Hemoglobin: 12.6 g/dL (ref 11.7–15.5)
Lymphs Abs: 2576 cells/uL (ref 850–3900)
MCH: 27 pg (ref 27.0–33.0)
MCHC: 33.1 g/dL (ref 32.0–36.0)
MCV: 81.8 fL (ref 80.0–100.0)
MPV: 10.8 fL (ref 7.5–12.5)
Monocytes Relative: 6.8 %
Neutro Abs: 5850 cells/uL (ref 1500–7800)
Neutrophils Relative %: 62.9 %
Platelets: 327 10*3/uL (ref 140–400)
RBC: 4.66 10*6/uL (ref 3.80–5.10)
RDW: 14.6 % (ref 11.0–15.0)
Total Lymphocyte: 27.7 %
WBC: 9.3 10*3/uL (ref 3.8–10.8)

## 2019-07-30 LAB — URIC ACID: Uric Acid, Serum: 4.6 mg/dL (ref 2.5–7.0)

## 2019-07-30 NOTE — Progress Notes (Signed)
Uric acid is WNL

## 2019-08-05 ENCOUNTER — Telehealth: Payer: Self-pay

## 2019-08-21 ENCOUNTER — Telehealth: Payer: Medicare Other

## 2019-08-21 ENCOUNTER — Ambulatory Visit: Payer: Self-pay

## 2019-08-21 ENCOUNTER — Other Ambulatory Visit: Payer: Self-pay

## 2019-08-21 DIAGNOSIS — I10 Essential (primary) hypertension: Secondary | ICD-10-CM

## 2019-08-21 DIAGNOSIS — Z6841 Body Mass Index (BMI) 40.0 and over, adult: Secondary | ICD-10-CM

## 2019-08-22 DIAGNOSIS — M8580 Other specified disorders of bone density and structure, unspecified site: Secondary | ICD-10-CM | POA: Diagnosis not present

## 2019-08-22 DIAGNOSIS — Z01419 Encounter for gynecological examination (general) (routine) without abnormal findings: Secondary | ICD-10-CM | POA: Diagnosis not present

## 2019-08-22 DIAGNOSIS — L309 Dermatitis, unspecified: Secondary | ICD-10-CM | POA: Diagnosis not present

## 2019-08-22 NOTE — Chronic Care Management (AMB) (Signed)
  Chronic Care Management   Outreach Note  08/22/2019 Name: Robin Odom MRN: [id number] DOB: 05/26/1950  Referred by: Glendale Chard, MD Reason for referral : Chronic Care Management (RQ #3 Initial Call - HTN/Obesity/Myalgia)   Third unsuccessful telephone outreach was attempted today. The patient was referred to the case management team for assistance with care management and care coordination. The patient's primary care provider has been notified of our unsuccessful attempts to make or maintain contact with the patient. The care management team is pleased to engage with this patient at any time in the future should he/she be interested in assistance from the care management team.   Follow Up Plan: The care management team is available to follow up with the patient after provider conversation with the patient regarding recommendation for care management engagement and subsequent re-referral to the care management team.   Barb Merino, RN, BSN, CCM Care Management Coordinator [place] [facility]  Direct Phone: [phone]

## 2019-08-29 ENCOUNTER — Other Ambulatory Visit: Payer: Self-pay | Admitting: Internal Medicine

## 2019-09-03 ENCOUNTER — Encounter: Payer: Self-pay | Admitting: Internal Medicine

## 2019-09-15 DIAGNOSIS — M722 Plantar fascial fibromatosis: Secondary | ICD-10-CM | POA: Diagnosis not present

## 2019-09-15 DIAGNOSIS — S99921A Unspecified injury of right foot, initial encounter: Secondary | ICD-10-CM | POA: Diagnosis not present

## 2019-09-15 DIAGNOSIS — M21611 Bunion of right foot: Secondary | ICD-10-CM | POA: Diagnosis not present

## 2019-10-03 ENCOUNTER — Other Ambulatory Visit: Payer: Self-pay | Admitting: Internal Medicine

## 2019-10-05 ENCOUNTER — Other Ambulatory Visit: Payer: Self-pay | Admitting: Internal Medicine

## 2019-10-07 DIAGNOSIS — Z1211 Encounter for screening for malignant neoplasm of colon: Secondary | ICD-10-CM | POA: Diagnosis not present

## 2019-10-07 DIAGNOSIS — K5730 Diverticulosis of large intestine without perforation or abscess without bleeding: Secondary | ICD-10-CM | POA: Diagnosis not present

## 2019-10-16 ENCOUNTER — Encounter: Payer: Self-pay | Admitting: Internal Medicine

## 2019-10-17 ENCOUNTER — Other Ambulatory Visit: Payer: Self-pay

## 2019-10-17 DIAGNOSIS — I10 Essential (primary) hypertension: Secondary | ICD-10-CM

## 2019-10-17 MED ORDER — POTASSIUM CHLORIDE ER 10 MEQ PO CPCR: 10.0000 meq | ORAL_CAPSULE | Freq: Every day | ORAL | 1 refills | Status: DC

## 2019-10-17 MED ORDER — METOPROLOL SUCCINATE ER 50 MG PO TB24: 50.0000 mg | ORAL_TABLET | Freq: Every day | ORAL | 1 refills | Status: DC

## 2019-10-18 DIAGNOSIS — Z23 Encounter for immunization: Secondary | ICD-10-CM | POA: Diagnosis not present

## 2019-10-20 NOTE — Progress Notes (Signed)
Office Visit Note  Patient: Robin Odom             Date of Birth: [birth year]           MRN: [id number]             PCP: Glendale Chard, MD Referring: Glendale Chard, MD Visit Date: 10/30/2019 Occupation: @GUAROCC @  Subjective:  Pain in joints..   History of Present Illness: Aminat Shelburne is a 69 y.o. female with history of gout and osteoarthritis.  She states she has not had any gout flare.  She continues to have some discomfort in her knee joints.  She states she has been having pain in her neck and lower back as well.  She has been seeing a Restaurant manager, fast food.  She has seen an orthopedic surgeon last year for her knee as well.  She denies any joint swelling.  She states she was recently diagnosed with osteopenia.  She has been trying to exercise but is difficult due to knee joint osteoarthritis.  She has been doing water aerobics.  Activities of Daily Living:  Patient reports morning stiffness for 0  minutes.   Patient Denies nocturnal pain.  Difficulty dressing/grooming: Denies Difficulty climbing stairs: Denies Difficulty getting out of chair: Denies Difficulty using hands for taps, buttons, cutlery, and/or writing: Reports  Review of Systems  Constitutional: Negative for fatigue, night sweats, weight gain and weight loss.  HENT: Negative for mouth sores, trouble swallowing, trouble swallowing, mouth dryness and nose dryness.   Eyes: Negative for pain, redness, itching, visual disturbance and dryness.  Respiratory: Negative for cough, shortness of breath and difficulty breathing.   Cardiovascular: Negative for chest pain, palpitations, hypertension, irregular heartbeat and swelling in legs/feet.  Gastrointestinal: Negative for blood in stool, constipation and diarrhea.  Endocrine: Negative for increased urination.  Genitourinary: Negative for difficulty urinating, painful urination and vaginal dryness.  Musculoskeletal: Positive for arthralgias, joint pain, myalgias, muscle tenderness and  myalgias. Negative for joint swelling, muscle weakness and morning stiffness.  Skin: Negative for color change, rash, hair loss, redness, skin tightness, ulcers and sensitivity to sunlight.  Allergic/Immunologic: Negative for susceptible to infections.  Neurological: Positive for numbness. Negative for dizziness, headaches, memory loss, night sweats and weakness.  Hematological: Negative for bruising/bleeding tendency and swollen glands.  Psychiatric/Behavioral: Negative for depressed mood, confusion and sleep disturbance. The patient is not nervous/anxious.     PMFS History:  Patient Active Problem List   Diagnosis Date Noted  . Osteopenia of multiple sites 10/30/2019  . Prediabetes 05/14/2018  . Essential hypertension, benign 05/14/2018  . Class 3 severe obesity due to excess calories with serious comorbidity and body mass index (BMI) of 40.0 to 44.9 in adult ([place]) 05/14/2018  . 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 tophus 07/20/2016  . Primary osteoarthritis of both hands 07/20/2016  . Primary osteoarthritis of both knees 07/20/2016  . Primary osteoarthritis of both feet 07/20/2016  . DJD (degenerative joint disease), cervical 07/20/2016  . Elevated CK 07/20/2016  . History of humerus fracture 07/20/2016  . History of hypertension 07/20/2016  . History of diabetes mellitus 07/20/2016  . History of hyperlipidemia 07/20/2016  . Chronic rhinitis 10/20/2015  . Anosmia 10/20/2015  . Mild intermittent asthma 10/20/2015    Past Medical History:  Diagnosis Date  . Arthritis   . Diabetes mellitus without complication ([place])   . Gout   . Hypertension   . Partial retinal tear of both eyes without detachment 08/29/2017    Family History  Problem Relation Age of Onset  . Heart disease Mother   . Hypertension Mother   . Diabetes Mother   . Arthritis Father   . Hypertension Father   . Prostate cancer Father   . Parkinson's disease Father   . Heart disease Father   .  Sinusitis Sister   . Allergic rhinitis Sister   . Diabetes Sister   . High Cholesterol Sister   . Hypertension Sister   . Diabetes Brother   . Hypertension Brother   . High Cholesterol Brother   . Hypertension Brother   . Angioedema Neg Hx   . Asthma Neg Hx   . Eczema Neg Hx   . Immunodeficiency Neg Hx   . Urticaria Neg Hx   . Breast cancer Neg Hx    Past Surgical History:  Procedure Laterality Date  . ABDOMINAL HYSTERECTOMY  1991  . CARPAL TUNNEL RELEASE Right 2019  . COLONOSCOPY     5 between 1994-2010  . Left knee surgery  2006  . ROTATOR CUFF REPAIR Left 2001  . ROTATOR CUFF REPAIR Right 2003  . SHOULDER SURGERY  2001/2003  . TONSILECTOMY/ADENOIDECTOMY WITH MYRINGOTOMY  1971   Social History   Social History Narrative  . Not on file   Immunization History  Administered Date(s) Administered  . Influenza, High Dose Seasonal PF 02/05/2017, 02/12/2018, 12/16/2018  . Influenza,inj,quad, With Preservative 02/13/2017, 02/01/2018  . Influenza-Unspecified 02/03/2015, 01/18/2018, 12/16/2018  . PFIZER SARS-COV-2 Vaccination 01/01/2019, 01/23/2019  . Pneumococcal Conjugate-13 01/20/2014  . Pneumococcal-Unspecified 02/14/2017     Objective: Vital Signs: BP (!) 147/87 (BP Location: Left Arm, Patient Position: Sitting, Cuff Size: Normal)   Pulse 86   Resp 17   Ht 5' 5.5" (1.664 m)   Wt 248 lb (112.5 kg)   BMI 40.64 kg/m    Physical Exam Vitals and nursing note reviewed.  Constitutional:      Appearance: She is well-developed.  HENT:     Head: Normocephalic and atraumatic.  Eyes:     Conjunctiva/sclera: Conjunctivae normal.  Cardiovascular:     Rate and Rhythm: Normal rate and regular rhythm.     Heart sounds: Normal heart sounds.  Pulmonary:     Effort: Pulmonary effort is normal.     Breath sounds: Normal breath sounds.  Abdominal:     General: Bowel sounds are normal.     Palpations: Abdomen is soft.  Musculoskeletal:     Cervical back: Normal range of  motion.  Lymphadenopathy:     Cervical: No cervical adenopathy.  Skin:    General: Skin is warm and dry.     Capillary Refill: Capillary refill takes less than 2 seconds.  Neurological:     Mental Status: She is alert and oriented to person, place, and time.  Psychiatric:        Behavior: Behavior normal.      Musculoskeletal Exam: C-spine had some stiffness with range of motion.  She has discomfort with range of motion of her lumbar spine.  Shoulder joints elbow joints and wrist joints with good range of motion.  She has some DIP and PIP thickening in her hands and feet.  She has discomfort range of motion of her knee joints without any warmth swelling or effusion.  CDAI Exam: CDAI Score: -- Patient Global: --; Provider Global: -- Swollen: --; Tender: -- Joint Exam 10/30/2019   No joint exam has been documented for this visit   There is currently no information documented on the homunculus. Go  to the Rheumatology activity and complete the homunculus joint exam.  Investigation: No additional findings.  Imaging: No results found.  Recent Labs: Lab Results  Component Value Date   WBC 9.3 07/29/2019   HGB 12.6 07/29/2019   PLT 327 07/29/2019   NA 144 07/15/2019   K 3.9 07/15/2019   CL 105 07/15/2019   CO2 24 07/15/2019   GLUCOSE 110 (H) 07/15/2019   BUN 14 07/15/2019   CREATININE 0.67 07/15/2019   BILITOT 0.3 07/15/2019   ALKPHOS 79 07/15/2019   AST 26 07/15/2019   ALT 22 07/15/2019   PROT 7.1 07/15/2019   ALBUMIN 4.6 07/15/2019   CALCIUM 9.6 07/15/2019   GFRAA 104 07/15/2019   10/29/2019 bone density report from [facility] showed T score of -2.1 in the left femoral neck consistent with osteopenia.  BMD 0.659. No comparison was available.  Speciality Comments: No specialty comments available.  Procedures:  No procedures performed Allergies: Codeine, Hydrocodone, and Penicillins   Assessment / Plan:     Visit Diagnoses: Idiopathic chronic gout  of multiple sites without tophus - Allopurinol 300 mg 1 tablet by mouth daily. uric acid: 07/29/2019 4.6 -patient denies having any gout flares.  Plan: Uric acid in October.  Medication monitoring encounter - Plan: CBC with Differential/Platelet, COMPLETE METABOLIC PANEL WITH GFR in October.  Primary osteoarthritis of both hands-joint protection muscle strengthening was discussed.  Primary osteoarthritis of both knees-she is having increased right knee joint discomfort.  No warmth swelling effusion was noted.  She will continue with water aerobics.  Have advised her to contact me in case her symptoms get worse.  Primary osteoarthritis of both feet-proper fitting shoes were discussed.  DDD (degenerative disc disease), cervical-she has been seeing a Restaurant manager, fast food.  Osteopenia of multiple sites - 10/29/2019 bone density report from [facility] showed T score of -2.1 in the left femoral neck consistent with osteopenia.  BMD 0.659.  I reviewed her DEXA scan and also discussed use of calcium rich diet, vitamin D and resistive exercises.  Other medical problems are listed as follows:  History of humerus fracture  History of hypertension-blood pressure was elevated today.  Have advised her to monitor blood pressure closely.  History of diabetes mellitus  History of hyperlipidemia  Orders: Orders Placed This Encounter  Procedures  . CBC with Differential/Platelet  . COMPLETE METABOLIC PANEL WITH GFR  . Uric acid   No orders of the defined types were placed in this encounter.     Follow-Up Instructions: Return in about 6 months (around 05/01/2020) for Osteoarthritis, Gout.   Bo Merino, MD  Note - This record has been created using Editor, commissioning.  Chart creation errors have been sought, but may not always  have been located. Such creation errors do not reflect on  the standard of medical care.

## 2019-10-21 ENCOUNTER — Other Ambulatory Visit: Payer: Self-pay

## 2019-10-21 MED ORDER — POTASSIUM CHLORIDE ER 10 MEQ PO CPCR: 10.0000 meq | ORAL_CAPSULE | Freq: Every day | ORAL | 1 refills | Status: DC

## 2019-10-28 ENCOUNTER — Ambulatory Visit: Payer: Medicare Other | Admitting: Rheumatology

## 2019-10-29 ENCOUNTER — Encounter: Payer: Self-pay | Admitting: Internal Medicine

## 2019-10-30 ENCOUNTER — Ambulatory Visit (INDEPENDENT_AMBULATORY_CARE_PROVIDER_SITE_OTHER): Payer: Medicare Other | Admitting: Rheumatology

## 2019-10-30 ENCOUNTER — Other Ambulatory Visit: Payer: Self-pay

## 2019-10-30 ENCOUNTER — Encounter: Payer: Self-pay | Admitting: Rheumatology

## 2019-10-30 VITALS — BP 147/87 | HR 86 | Resp 17 | Ht 65.5 in | Wt 248.0 lb

## 2019-10-30 DIAGNOSIS — M19071 Primary osteoarthritis, right ankle and foot: Secondary | ICD-10-CM

## 2019-10-30 DIAGNOSIS — M5030 Other cervical disc degeneration, unspecified cervical region: Secondary | ICD-10-CM

## 2019-10-30 DIAGNOSIS — M1A09X0 Idiopathic chronic gout, multiple sites, without tophus (tophi): Secondary | ICD-10-CM

## 2019-10-30 DIAGNOSIS — M19041 Primary osteoarthritis, right hand: Secondary | ICD-10-CM | POA: Diagnosis not present

## 2019-10-30 DIAGNOSIS — Z5181 Encounter for therapeutic drug level monitoring: Secondary | ICD-10-CM | POA: Diagnosis not present

## 2019-10-30 DIAGNOSIS — M170 Bilateral primary osteoarthritis of knee: Secondary | ICD-10-CM | POA: Diagnosis not present

## 2019-10-30 DIAGNOSIS — M19072 Primary osteoarthritis, left ankle and foot: Secondary | ICD-10-CM

## 2019-10-30 DIAGNOSIS — Z8781 Personal history of (healed) traumatic fracture: Secondary | ICD-10-CM

## 2019-10-30 DIAGNOSIS — M8589 Other specified disorders of bone density and structure, multiple sites: Secondary | ICD-10-CM

## 2019-10-30 DIAGNOSIS — Z8679 Personal history of other diseases of the circulatory system: Secondary | ICD-10-CM

## 2019-10-30 DIAGNOSIS — Z8639 Personal history of other endocrine, nutritional and metabolic disease: Secondary | ICD-10-CM

## 2019-10-30 DIAGNOSIS — M19042 Primary osteoarthritis, left hand: Secondary | ICD-10-CM

## 2019-10-30 NOTE — Patient Instructions (Signed)
Please return for lab work in the first week of October

## 2019-11-04 ENCOUNTER — Telehealth: Payer: Self-pay | Admitting: Internal Medicine

## 2019-11-04 NOTE — Telephone Encounter (Signed)
PATIENT CALLED REGARDING HER BILL FROM A PHYSICAL THAT WAS DONE AND SHE WAS NOT AWARE THAT IT IS NOT COVERED BY HER INSURANCE. SENT BILLING INFORMATION TO [facility] FOR REBILLING

## 2019-11-13 ENCOUNTER — Other Ambulatory Visit: Payer: Self-pay | Admitting: Internal Medicine

## 2019-11-17 ENCOUNTER — Other Ambulatory Visit: Payer: Self-pay

## 2019-11-17 ENCOUNTER — Ambulatory Visit (INDEPENDENT_AMBULATORY_CARE_PROVIDER_SITE_OTHER): Payer: Medicare Other | Admitting: Internal Medicine

## 2019-11-17 ENCOUNTER — Encounter: Payer: Self-pay | Admitting: Internal Medicine

## 2019-11-17 VITALS — BP 126/72 | HR 80 | Temp 97.9°F | Ht 64.4 in | Wt 243.0 lb

## 2019-11-17 DIAGNOSIS — I10 Essential (primary) hypertension: Secondary | ICD-10-CM | POA: Diagnosis not present

## 2019-11-17 DIAGNOSIS — M5431 Sciatica, right side: Secondary | ICD-10-CM

## 2019-11-17 DIAGNOSIS — Z6841 Body Mass Index (BMI) 40.0 and over, adult: Secondary | ICD-10-CM | POA: Diagnosis not present

## 2019-11-17 DIAGNOSIS — R7303 Prediabetes: Secondary | ICD-10-CM

## 2019-11-17 MED ORDER — CYCLOBENZAPRINE HCL 5 MG PO TABS: 5.0000 mg | ORAL_TABLET | Freq: Every day | ORAL | 0 refills | Status: DC

## 2019-11-17 MED ORDER — DIAZEPAM 2 MG PO TABS: ORAL_TABLET | ORAL | 0 refills | Status: DC

## 2019-11-17 NOTE — Patient Instructions (Signed)

## 2019-11-17 NOTE — Progress Notes (Signed)
This visit occurred during the SARS-CoV-2 public health emergency.  Safety protocols were in place, including screening questions prior to the visit, additional usage of staff PPE, and extensive cleaning of exam room while observing appropriate contact time as indicated for disinfecting solutions.  Subjective:     Patient ID: Robin Odom , female    DOB: November 21, 1950 , 69 y.o.   MRN: [id number]   Chief Complaint  Patient presents with  . Hypertension  . Hip Pain    HPI  She is here today for a bp check. She reports compliance with meds.   Hypertension This is a chronic problem. The current episode started more than 1 year ago. The problem has been gradually improving since onset. Risk factors for coronary artery disease include obesity and post-menopausal state. Past treatments include direct vasodilators, angiotensin blockers and diuretics.  Hip Pain  The incident occurred more than 1 week ago. There was no injury mechanism. The pain is present in the right hip. The quality of the pain is described as shooting. The pain is at a severity of 4/10. The pain is moderate. The pain has been constant since onset. Associated symptoms include tingling. She reports no foreign bodies present. The symptoms are aggravated by movement.     Past Medical History:  Diagnosis Date  . Arthritis   . Diabetes mellitus without complication ([place])   . Gout   . Hypertension   . Partial retinal tear of both eyes without detachment 08/29/2017     Family History  Problem Relation Age of Onset  . Heart disease Mother   . Hypertension Mother   . Diabetes Mother   . Arthritis Father   . Hypertension Father   . Prostate cancer Father   . Parkinson's disease Father   . Heart disease Father   . Sinusitis Sister   . Allergic rhinitis Sister   . Diabetes Sister   . High Cholesterol Sister   . Hypertension Sister   . Diabetes Brother   . Hypertension Brother   . High Cholesterol Brother   . Hypertension  Brother   . Angioedema Neg Hx   . Asthma Neg Hx   . Eczema Neg Hx   . Immunodeficiency Neg Hx   . Urticaria Neg Hx   . Breast cancer Neg Hx      Current Outpatient Medications:  .  albuterol (VENTOLIN HFA) 108 (90 Base) MCG/ACT inhaler, Inhale into the lungs every 6 (six) hours as needed for wheezing or shortness of breath., Disp: , Rfl:  .  allopurinol (ZYLOPRIM) 300 MG tablet, Take 1 tablet (300 mg total) by mouth daily., Disp: 90 tablet, Rfl: 0 .  amLODipine (NORVASC) 5 MG tablet, TAKE 1 TABLET BY MOUTH EVERY DAY, Disp: 90 tablet, Rfl: 2 .  Ascorbic Acid (VITAMIN C PO), Take 2,000 mg by mouth daily., Disp: , Rfl:  .  B Complex Vitamins (VITAMIN-B COMPLEX) TABS, Take by mouth., Disp: , Rfl:  .  BENFOTIAMINE PO, Take by mouth 2 (two) times daily., Disp: , Rfl:  .  Calcium Carbonate-Vit D-Min (CALCIUM 1200 PO), Take 600 mg by mouth daily. , Disp: , Rfl:  .  Cholecalciferol (VITAMIN D3) 3000 units TABS, Take by mouth. , Disp: , Rfl:  .  colchicine 0.6 MG tablet, Take 0.6 mg by mouth as needed., Disp: , Rfl:  .  fluticasone (FLOVENT HFA) 110 MCG/ACT inhaler, Inhale 2 puffs into the lungs 2 (two) times daily. (Patient taking differently: Inhale 2 puffs into the  lungs daily as needed. ), Disp: 1 Inhaler, Rfl: 5 .  furosemide (LASIX) 20 MG tablet, Take 1 tablet (20 mg total) by mouth daily., Disp: 90 tablet, Rfl: 1 .  hydrALAZINE (APRESOLINE) 25 MG tablet, TAKE 1 TABLET BY MOUTH TWICE A DAY, Disp: 60 tablet, Rfl: 1 .  ipratropium (ATROVENT) 0.06 % nasal spray, Place 2 sprays into both nostrils as needed for rhinitis., Disp: , Rfl:  .  magnesium oxide (MAG-OX) 400 MG tablet, Take 400 mg by mouth daily., Disp: , Rfl:  .  Melatonin 5 MG CAPS, Take by mouth at bedtime as needed. , Disp: , Rfl:  .  metoprolol succinate (TOPROL-XL) 50 MG 24 hr tablet, Take 1 tablet (50 mg total) by mouth daily. Take with or immediately following a meal., Disp: 90 tablet, Rfl: 1 .  Multiple Vitamin (MULTIVITAMIN+)  LIQD, Take by mouth., Disp: , Rfl:  .  olmesartan (BENICAR) 40 MG tablet, Take 1 tablet (40 mg total) by mouth daily., Disp: 90 tablet, Rfl: 2 .  omega-3 acid ethyl esters (LOVAZA) 1 G capsule, Take 2 g by mouth 2 (two) times daily. Reported on 10/20/2015, Disp: , Rfl:  .  potassium chloride (MICRO-K) 10 MEQ CR capsule, Take 1 capsule (10 mEq total) by mouth daily., Disp: 90 capsule, Rfl: 1 .  triamcinolone cream (KENALOG) 0.5 %, triamcinolone acetonide 0.5 % topical cream  APPLY A THIN LAYER TO THE AFFECTED AREA(S) 2 TIMES PER DAY AS NEEDED, Disp: , Rfl:  .  TURMERIC CURCUMIN PO, Take by mouth daily., Disp: , Rfl:  .  Apple Cider Vinegar 500 MG TABS, Take by mouth 2 (two) times a day. (Patient not taking: Reported on 10/30/2019), Disp: , Rfl:  .  Carbinoxamine Maleate 4 MG TABS, Take 1 tablet (4 mg total) by mouth daily as needed. (Patient not taking: Reported on 10/30/2019), Disp: 56 each, Rfl: 5 .  Cholecalciferol (VITAMIN D) 125 MCG (5000 UT) CAPS, Take 5,000 Units by mouth daily., Disp: , Rfl:  .  OZEMPIC, 0.25 OR 0.5 MG/DOSE, 2 MG/1.5ML SOPN, INJECT 0.5MG INTO THE SKIN ONCE A WEEK (Patient not taking: Reported on 10/30/2019), Disp: 3 pen, Rfl: 1 .  zinc sulfate 220 (50 Zn) MG capsule, Take 220 mg by mouth daily., Disp: , Rfl:    Allergies  Allergen Reactions  . Codeine Other (See Comments)    Hallucinations  . Hydrocodone     Nausea   . Penicillins Hives     Review of Systems  Constitutional: Negative.   Respiratory: Negative.   Cardiovascular: Negative.   Gastrointestinal: Negative.   Musculoskeletal: Positive for arthralgias and back pain.  Neurological: Positive for tingling.     Today's Vitals   11/17/19 1035  BP: 126/72  Pulse: 80  Temp: 97.9 F (36.6 C)  TempSrc: Oral  Weight: 243 lb (110.2 kg)  Height: 5' 4.4" (1.636 m)  PainSc: 4    Body mass index is 41.19 kg/m.   Objective:  Physical Exam Vitals and nursing note reviewed.  Constitutional:      Appearance:  Normal appearance. She is obese.  HENT:     Head: Normocephalic and atraumatic.  Cardiovascular:     Rate and Rhythm: Normal rate and regular rhythm.     Heart sounds: Normal heart sounds.  Pulmonary:     Effort: Pulmonary effort is normal.     Breath sounds: Normal breath sounds.  Musculoskeletal:     Comments: Neg straight leg test b/l  Skin:  General: Skin is warm.  Neurological:     General: No focal deficit present.     Mental Status: She is alert.  Psychiatric:        Mood and Affect: Mood normal.        Behavior: Behavior normal.         Assessment And Plan:     1. Essential hypertension, benign  Comments: Well controlled. She will continue with current meds. She is encouraged to avoid adding salt to her foods.   2. Prediabetes  Comments: I will check an a1c today.  - Insulin, random(561) - Hemoglobin A1c - BMP8+EGFR  3. Sciatica of right side  Comments: I think this is the cause of her hip pain.  She declined kenalog injection.  She was prescribed cyclobenzaprine, 2m po qhs prn. I will also send her for MRI. I will make further recommendations once results are avail. She was also given rx valium to use an hour prior to her MRI . She does request open MRI.   - MR Lumbar Spine Wo Contrast; Future  4. Class 3 severe obesity due to excess calories with serious comorbidity and body mass index (BMI) of 40.0 to 44.9 in adult ([facility]   She is encouraged to initially strive for BMI less than 35 to decrease cardiac risk. She is advised to exercise no less than 150 minutes per week.    Patient was given opportunity to ask questions. Patient verbalized understanding of the plan and was able to repeat key elements of the plan. All questions were answered to their satisfaction.  RMaximino Greenland MD   I, RMaximino Greenland MD, have reviewed all documentation for this visit. The documentation on 11/17/19 for the exam, diagnosis, procedures, and orders are all accurate and  complete.  THE PATIENT IS ENCOURAGED TO PRACTICE SOCIAL DISTANCING DUE TO THE COVID-19 PANDEMIC.

## 2019-11-18 LAB — BMP8+EGFR
BUN/Creatinine Ratio: 20 (ref 12–28)
BUN: 15 mg/dL (ref 8–27)
CO2: 25 mmol/L (ref 20–29)
Calcium: 9.9 mg/dL (ref 8.7–10.3)
Chloride: 102 mmol/L (ref 96–106)
Creatinine, Ser: 0.74 mg/dL (ref 0.57–1.00)
GFR calc Af Amer: 95 mL/min/{1.73_m2} (ref >59)
GFR calc non Af Amer: 82 mL/min/{1.73_m2} (ref >59)
Glucose: 118 mg/dL — ABNORMAL HIGH (ref 65–99)
Potassium: 3.9 mmol/L (ref 3.5–5.2)
Sodium: 141 mmol/L (ref 134–144)

## 2019-11-18 LAB — HEMOGLOBIN A1C
Est. average glucose Bld gHb Est-mCnc: 123 mg/dL
Hgb A1c MFr Bld: 5.9 % — ABNORMAL HIGH (ref 4.8–5.6)

## 2019-11-18 LAB — INSULIN, RANDOM: INSULIN: 34.8 u[IU]/mL — ABNORMAL HIGH (ref 2.6–24.9)

## 2019-11-20 ENCOUNTER — Other Ambulatory Visit: Payer: Self-pay

## 2019-11-20 ENCOUNTER — Encounter: Payer: Self-pay | Admitting: Internal Medicine

## 2019-11-20 NOTE — Telephone Encounter (Signed)
Cyclobenzaprine approved for coverage from 10/11/2019 - 11/19/2020. Patient and pharmacy notified.

## 2019-11-26 ENCOUNTER — Encounter: Payer: Self-pay | Admitting: Internal Medicine

## 2019-11-26 DIAGNOSIS — Z1211 Encounter for screening for malignant neoplasm of colon: Secondary | ICD-10-CM | POA: Diagnosis not present

## 2019-11-26 DIAGNOSIS — K5730 Diverticulosis of large intestine without perforation or abscess without bleeding: Secondary | ICD-10-CM | POA: Diagnosis not present

## 2019-11-26 DIAGNOSIS — K635 Polyp of colon: Secondary | ICD-10-CM | POA: Diagnosis not present

## 2019-11-26 DIAGNOSIS — D124 Benign neoplasm of descending colon: Secondary | ICD-10-CM | POA: Diagnosis not present

## 2019-11-26 LAB — HM COLONOSCOPY

## 2019-12-05 ENCOUNTER — Other Ambulatory Visit: Payer: Self-pay | Admitting: Rheumatology

## 2019-12-05 NOTE — Telephone Encounter (Signed)
Left message for patient to advise she is due for labs in September.

## 2019-12-05 NOTE — Telephone Encounter (Signed)
CBC, CMP and uric acid are due in September.

## 2019-12-08 ENCOUNTER — Encounter: Payer: Self-pay | Admitting: Internal Medicine

## 2019-12-09 ENCOUNTER — Telehealth: Payer: Self-pay

## 2019-12-09 ENCOUNTER — Other Ambulatory Visit: Payer: Self-pay | Admitting: Nurse Practitioner

## 2019-12-09 MED ORDER — DIAZEPAM 2 MG PO TABS: ORAL_TABLET | ORAL | 0 refills | Status: DC

## 2019-12-09 NOTE — Telephone Encounter (Signed)
Looked at dispense history for valium

## 2019-12-12 ENCOUNTER — Other Ambulatory Visit: Payer: Self-pay | Admitting: Internal Medicine

## 2019-12-16 ENCOUNTER — Other Ambulatory Visit: Payer: Self-pay

## 2019-12-16 ENCOUNTER — Ambulatory Visit
Admission: RE | Admit: 2019-12-16 | Discharge: 2019-12-16 | Disposition: A | Discharge status: Home / Self Care | Payer: Medicare Other | Source: Ambulatory Visit | Attending: Internal Medicine | Admitting: Internal Medicine

## 2019-12-16 DIAGNOSIS — M5431 Sciatica, right side: Secondary | ICD-10-CM

## 2019-12-16 DIAGNOSIS — M48061 Spinal stenosis, lumbar region without neurogenic claudication: Secondary | ICD-10-CM | POA: Diagnosis not present

## 2019-12-23 ENCOUNTER — Telehealth: Payer: Self-pay

## 2019-12-23 ENCOUNTER — Other Ambulatory Visit: Payer: Self-pay

## 2019-12-23 DIAGNOSIS — M48061 Spinal stenosis, lumbar region without neurogenic claudication: Secondary | ICD-10-CM

## 2019-12-23 NOTE — Telephone Encounter (Signed)
Glendale Chard, MD  [facility] Done, [place] Please fax MRI results to [facility], Dr. Norma Fredrickson.   Also, place referral to Dr. Ashok Pall, you can see me for diagnosis.  RS   Referral placed, MRI results faxed [phone]

## 2019-12-30 DIAGNOSIS — Z20822 Contact with and (suspected) exposure to covid-19: Secondary | ICD-10-CM | POA: Diagnosis not present

## 2020-01-10 ENCOUNTER — Other Ambulatory Visit: Payer: Self-pay | Admitting: Internal Medicine

## 2020-01-13 DIAGNOSIS — Z23 Encounter for immunization: Secondary | ICD-10-CM | POA: Diagnosis not present

## 2020-01-14 ENCOUNTER — Other Ambulatory Visit: Payer: Self-pay | Admitting: Internal Medicine

## 2020-01-14 DIAGNOSIS — I10 Essential (primary) hypertension: Secondary | ICD-10-CM

## 2020-01-14 DIAGNOSIS — Z1231 Encounter for screening mammogram for malignant neoplasm of breast: Secondary | ICD-10-CM

## 2020-01-17 ENCOUNTER — Encounter: Payer: Self-pay | Admitting: Internal Medicine

## 2020-01-19 ENCOUNTER — Other Ambulatory Visit: Payer: Self-pay | Admitting: Internal Medicine

## 2020-01-19 ENCOUNTER — Other Ambulatory Visit: Payer: Self-pay

## 2020-01-19 DIAGNOSIS — I10 Essential (primary) hypertension: Secondary | ICD-10-CM

## 2020-01-19 MED ORDER — METOPROLOL SUCCINATE ER 50 MG PO TB24: 50.0000 mg | ORAL_TABLET | Freq: Every day | ORAL | 1 refills | Status: DC

## 2020-01-20 DIAGNOSIS — M48061 Spinal stenosis, lumbar region without neurogenic claudication: Secondary | ICD-10-CM | POA: Diagnosis not present

## 2020-01-26 DIAGNOSIS — H34832 Tributary (branch) retinal vein occlusion, left eye, with macular edema: Secondary | ICD-10-CM | POA: Diagnosis not present

## 2020-01-26 DIAGNOSIS — H43813 Vitreous degeneration, bilateral: Secondary | ICD-10-CM | POA: Diagnosis not present

## 2020-01-26 DIAGNOSIS — H35033 Hypertensive retinopathy, bilateral: Secondary | ICD-10-CM | POA: Diagnosis not present

## 2020-01-26 DIAGNOSIS — H35042 Retinal micro-aneurysms, unspecified, left eye: Secondary | ICD-10-CM | POA: Diagnosis not present

## 2020-01-27 ENCOUNTER — Other Ambulatory Visit: Payer: Self-pay

## 2020-01-27 ENCOUNTER — Ambulatory Visit
Admission: RE | Admit: 2020-01-27 | Discharge: 2020-01-27 | Disposition: A | Discharge status: Home / Self Care | Payer: Medicare Other | Source: Ambulatory Visit | Attending: Internal Medicine | Admitting: Internal Medicine

## 2020-01-27 DIAGNOSIS — Z1231 Encounter for screening mammogram for malignant neoplasm of breast: Secondary | ICD-10-CM | POA: Diagnosis not present

## 2020-02-04 ENCOUNTER — Other Ambulatory Visit: Payer: Self-pay

## 2020-02-04 DIAGNOSIS — M1A09X0 Idiopathic chronic gout, multiple sites, without tophus (tophi): Secondary | ICD-10-CM

## 2020-02-04 DIAGNOSIS — Z5181 Encounter for therapeutic drug level monitoring: Secondary | ICD-10-CM

## 2020-02-05 LAB — CBC WITH DIFFERENTIAL/PLATELET
Absolute Monocytes: 874 cells/uL (ref 200–950)
Basophils Absolute: 62 cells/uL (ref 0–200)
Basophils Relative: 0.6 %
Eosinophils Absolute: 187 cells/uL (ref 15–500)
Eosinophils Relative: 1.8 %
HCT: 38.6 % (ref 35.0–45.0)
Hemoglobin: 12.7 g/dL (ref 11.7–15.5)
Lymphs Abs: 2725 cells/uL (ref 850–3900)
MCH: 26.4 pg — ABNORMAL LOW (ref 27.0–33.0)
MCHC: 32.9 g/dL (ref 32.0–36.0)
MCV: 80.2 fL (ref 80.0–100.0)
MPV: 10.3 fL (ref 7.5–12.5)
Monocytes Relative: 8.4 %
Neutro Abs: 6552 cells/uL (ref 1500–7800)
Neutrophils Relative %: 63 %
Platelets: 316 10*3/uL (ref 140–400)
RBC: 4.81 10*6/uL (ref 3.80–5.10)
RDW: 14.6 % (ref 11.0–15.0)
Total Lymphocyte: 26.2 %
WBC: 10.4 10*3/uL (ref 3.8–10.8)

## 2020-02-05 LAB — COMPLETE METABOLIC PANEL WITH GFR
AG Ratio: 1.5 (calc) (ref 1.0–2.5)
ALT: 13 U/L (ref 6–29)
AST: 17 U/L (ref 10–35)
Albumin: 3.9 g/dL (ref 3.6–5.1)
Alkaline phosphatase (APISO): 66 U/L (ref 37–153)
BUN: 20 mg/dL (ref 7–25)
CO2: 30 mmol/L (ref 20–32)
Calcium: 9.5 mg/dL (ref 8.6–10.4)
Chloride: 107 mmol/L (ref 98–110)
Creat: 0.87 mg/dL (ref 0.60–0.93)
GFR, Est African American: 78 mL/min/{1.73_m2} (ref >=60)
GFR, Est Non African American: 67 mL/min/{1.73_m2} (ref >=60)
Globulin: 2.6 g/dL (calc) (ref 1.9–3.7)
Glucose, Bld: 93 mg/dL (ref 65–99)
Potassium: 3.8 mmol/L (ref 3.5–5.3)
Sodium: 142 mmol/L (ref 135–146)
Total Bilirubin: 0.3 mg/dL (ref 0.2–1.2)
Total Protein: 6.5 g/dL (ref 6.1–8.1)

## 2020-02-05 LAB — URIC ACID: Uric Acid, Serum: 4.6 mg/dL (ref 2.5–7.0)

## 2020-02-10 ENCOUNTER — Other Ambulatory Visit: Payer: Self-pay | Admitting: Internal Medicine

## 2020-02-18 DIAGNOSIS — K5730 Diverticulosis of large intestine without perforation or abscess without bleeding: Secondary | ICD-10-CM | POA: Diagnosis not present

## 2020-02-18 DIAGNOSIS — Z86010 Personal history of colonic polyps: Secondary | ICD-10-CM | POA: Diagnosis not present

## 2020-02-18 DIAGNOSIS — Z1211 Encounter for screening for malignant neoplasm of colon: Secondary | ICD-10-CM | POA: Diagnosis not present

## 2020-02-18 LAB — HM COLONOSCOPY

## 2020-02-19 ENCOUNTER — Encounter: Payer: Self-pay | Admitting: Internal Medicine

## 2020-02-24 ENCOUNTER — Other Ambulatory Visit: Payer: Self-pay | Admitting: Internal Medicine

## 2020-03-05 DIAGNOSIS — Z23 Encounter for immunization: Secondary | ICD-10-CM | POA: Diagnosis not present

## 2020-03-17 DIAGNOSIS — Z7689 Persons encountering health services in other specified circumstances: Secondary | ICD-10-CM | POA: Diagnosis not present

## 2020-03-17 DIAGNOSIS — L209 Atopic dermatitis, unspecified: Secondary | ICD-10-CM | POA: Diagnosis not present

## 2020-03-18 ENCOUNTER — Other Ambulatory Visit: Payer: Self-pay

## 2020-03-18 ENCOUNTER — Ambulatory Visit (INDEPENDENT_AMBULATORY_CARE_PROVIDER_SITE_OTHER): Payer: Medicare Other | Admitting: Allergy and Immunology

## 2020-03-18 ENCOUNTER — Encounter: Payer: Self-pay | Admitting: Allergy and Immunology

## 2020-03-18 DIAGNOSIS — J310 Chronic rhinitis: Secondary | ICD-10-CM

## 2020-03-18 DIAGNOSIS — Z87898 Personal history of other specified conditions: Secondary | ICD-10-CM | POA: Diagnosis not present

## 2020-03-18 MED ORDER — AZELASTINE HCL 0.1 % NA SOLN: NASAL | 5 refills | Status: DC

## 2020-03-18 NOTE — Assessment & Plan Note (Signed)
The patient does not believe that she has ever wheezed from the chest, but has only had whistling from the nose/upper respiratory tract.   The patient will call if she develops symptoms concerning for asthma, such as chest tightness, shortness of breath, and/or wheezing from the chest.

## 2020-03-18 NOTE — Assessment & Plan Note (Signed)
   Continue ipratropium 0.06% nasal spray, 2 sprays per nostril every 8 hours if needed.  If needed, add azelastine nasal spray, 1-2 sprays per nostril 2 times daily.  A prescription has been provided.  Nasal saline lavage (NeilMed) has been recommended as needed and prior to medicated nasal sprays along with instructions for proper administration.

## 2020-03-18 NOTE — Patient Instructions (Addendum)
Chronic rhinitis  Continue ipratropium 0.06% nasal spray, 2 sprays per nostril every 8 hours if needed.  If needed, add azelastine nasal spray, 1-2 sprays per nostril 2 times daily.  A prescription has been provided.  Nasal saline lavage (NeilMed) has been recommended as needed and prior to medicated nasal sprays along with instructions for proper administration.  History of wheezing The patient does not believe that she has ever wheezed from the chest, but has only had whistling from the nose/upper respiratory tract.   The patient will call if she develops symptoms concerning for asthma, such as chest tightness, shortness of breath, and/or wheezing from the chest.   Return in about 1 year (around 03/18/2021), or if symptoms worsen or fail to improve.

## 2020-03-18 NOTE — Progress Notes (Signed)
Follow-up Note  RE: Zarin Hagmann MRN: [id number] DOB: 08-29-1950 Date of Office Visit: 03/18/2020  Primary care provider: Glendale Chard, MD Referring provider: Glendale Chard, MD  History of present illness: Robin Odom is a 69 y.o. female with chronic rhinitis and intermittent asthma presenting today for follow-up.   She reports that overall her nasal symptoms have been well controlled.  However, 2 days ago she experienced "marathon sneezing."  She reports that ipratropium nasal spray has provided significant relief.  However, she does state that she on occasion will experience nasal congestion with whistling through her nose.  She states that over-the-counter antihistamines "dry up" her sinuses and first generation antihistamines cause her to be forgetful.  She denies wheezing from the chest.  She reports that in the past she has "wheeze" through her nose but denies chest tightness, dyspnea, and wheezing from her chest.  Assessment and plan: Chronic rhinitis  Continue ipratropium 0.06% nasal spray, 2 sprays per nostril every 8 hours if needed.  If needed, add azelastine nasal spray, 1-2 sprays per nostril 2 times daily.  A prescription has been provided.  Nasal saline lavage (NeilMed) has been recommended as needed and prior to medicated nasal sprays along with instructions for proper administration.  History of wheezing The patient does not believe that she has ever wheezed from the chest, but has only had whistling from the nose/upper respiratory tract.   The patient will call if she develops symptoms concerning for asthma, such as chest tightness, shortness of breath, and/or wheezing from the chest.   Meds ordered this encounter  Medications  . azelastine (ASTELIN) 0.1 % nasal spray    Sig: 1-2 sprays each nostril twice daily as needed    Dispense:  30 mL    Refill:  5    Physical examination: Blood pressure 136/76, pulse 70, temperature (!) 97.2 F (36.2 C), temperature  source Temporal, resp. rate 16, height 5' 5.5" (1.664 m), weight 245 lb 3.2 oz (111.2 kg), SpO2 98 %.  General: Alert, interactive, in no acute distress. HEENT: TMs pearly gray, turbinates mildly edematous without discharge, post-pharynx unremarkable. Neck: Supple without lymphadenopathy. Lungs: Clear to auscultation without wheezing, rhonchi or rales. CV: Normal S1, S2 without murmurs. Skin: Warm and dry, without lesions or rashes.  The following portions of the patient's history were reviewed and updated as appropriate: allergies, current medications, past family history, past medical history, past social history, past surgical history and problem list.  Current Outpatient Medications  Medication Sig Dispense Refill  . albuterol (VENTOLIN HFA) 108 (90 Base) MCG/ACT inhaler Inhale into the lungs every 6 (six) hours as needed for wheezing or shortness of breath.    . allopurinol (ZYLOPRIM) 300 MG tablet TAKE 1 TABLET BY MOUTH EVERY DAY 90 tablet 0  . amLODipine (NORVASC) 5 MG tablet TAKE 1 TABLET BY MOUTH EVERY DAY 90 tablet 2  . Ascorbic Acid (VITAMIN C PO) Take 2,000 mg by mouth daily.    . B Complex Vitamins (VITAMIN-B COMPLEX) TABS Take by mouth.    . BENFOTIAMINE PO Take by mouth 2 (two) times daily.    . Calcium Carbonate-Vit D-Min (CALCIUM 1200 PO) Take 600 mg by mouth daily.     . Cholecalciferol (VITAMIN D3) 3000 units TABS Take by mouth.     . colchicine 0.6 MG tablet Take 0.6 mg by mouth as needed.    . cyclobenzaprine (FLEXERIL) 5 MG tablet Take 1 tablet (5 mg total) by mouth at bedtime. As needed  30 tablet 0  . fluticasone (FLOVENT HFA) 110 MCG/ACT inhaler Inhale 2 puffs into the lungs 2 (two) times daily. (Patient taking differently: Inhale 2 puffs into the lungs daily as needed. ) 1 Inhaler 5  . furosemide (LASIX) 20 MG tablet TAKE 1 TABLET BY MOUTH EVERY DAY 90 tablet 1  . hydrALAZINE (APRESOLINE) 25 MG tablet TAKE 1 TABLET BY MOUTH TWICE A DAY 180 tablet 1  . ipratropium  (ATROVENT) 0.06 % nasal spray Place 2 sprays into both nostrils as needed for rhinitis.    . magnesium oxide (MAG-OX) 400 MG tablet Take 400 mg by mouth daily.    . metoprolol succinate (TOPROL-XL) 50 MG 24 hr tablet Take 1 tablet (50 mg total) by mouth daily. Take with or immediately following a meal. 90 tablet 1  . Multiple Vitamin (MULTIVITAMIN+) LIQD Take by mouth.    . olmesartan (BENICAR) 40 MG tablet TAKE 1 TABLET BY MOUTH EVERY DAY 90 tablet 2  . omega-3 acid ethyl esters (LOVAZA) 1 G capsule Take 2 g by mouth 2 (two) times daily. Reported on 10/20/2015    . potassium chloride (MICRO-K) 10 MEQ CR capsule Take 1 capsule (10 mEq total) by mouth daily. 90 capsule 1  . triamcinolone cream (KENALOG) 0.5 % triamcinolone acetonide 0.5 % topical cream  APPLY A THIN LAYER TO THE AFFECTED AREA(S) 2 TIMES PER DAY AS NEEDED    . TURMERIC CURCUMIN PO Take by mouth daily.    Marland Kitchen azelastine (ASTELIN) 0.1 % nasal spray 1-2 sprays each nostril twice daily as needed 30 mL 5  . diazepam (VALIUM) 2 MG tablet One tab po qd prn 4 tablet 0  . Melatonin 5 MG CAPS Take by mouth at bedtime as needed.      No current facility-administered medications for this visit.    Allergies  Allergen Reactions  . Codeine Other (See Comments)    Hallucinations  . Penicillins Hives    I appreciate the opportunity to take part in Marelyn's care. Please do not hesitate to contact me with questions.  Sincerely,   R. Edgar Frisk, MD

## 2020-03-29 ENCOUNTER — Other Ambulatory Visit: Payer: Self-pay | Admitting: Rheumatology

## 2020-03-29 NOTE — Telephone Encounter (Signed)
Last Visit: 10/30/2019 Next Visit: 05/06/2020 Labs: 02/04/2020 Uric acid is within the desirable range. CBC and CMP WNL  Current Dose per office note 10/30/2019: Allopurinol 300 mg 1 tablet by mouth daily DX:  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 tophus   Okay to refill per Dr. Estanislado Pandy

## 2020-04-09 ENCOUNTER — Other Ambulatory Visit: Payer: Self-pay | Admitting: Internal Medicine

## 2020-04-15 DIAGNOSIS — H35033 Hypertensive retinopathy, bilateral: Secondary | ICD-10-CM | POA: Diagnosis not present

## 2020-04-15 DIAGNOSIS — H43813 Vitreous degeneration, bilateral: Secondary | ICD-10-CM | POA: Diagnosis not present

## 2020-04-15 DIAGNOSIS — H34832 Tributary (branch) retinal vein occlusion, left eye, with macular edema: Secondary | ICD-10-CM | POA: Diagnosis not present

## 2020-04-20 DIAGNOSIS — Z20822 Contact with and (suspected) exposure to covid-19: Secondary | ICD-10-CM | POA: Diagnosis not present

## 2020-04-20 DIAGNOSIS — Z03818 Encounter for observation for suspected exposure to other biological agents ruled out: Secondary | ICD-10-CM | POA: Diagnosis not present

## 2020-04-22 NOTE — Progress Notes (Deleted)
Office Visit Note  Patient: Robin Odom             Date of Birth: 03/03/1951           MRN: [id number]             PCP: Glendale Chard, MD Referring: Glendale Chard, MD Visit Date: 05/06/2020 Occupation: @GUAROCC @  Subjective:  No chief complaint on file.   History of Present Illness: Robin Odom is a 69 y.o. female ***   Activities of Daily Living:  Patient reports morning stiffness for *** {minute/hour:19697}.   Patient {ACTIONS;DENIES/REPORTS:21021675::"Denies"} nocturnal pain.  Difficulty dressing/grooming: {ACTIONS;DENIES/REPORTS:21021675::"Denies"} Difficulty climbing stairs: {ACTIONS;DENIES/REPORTS:21021675::"Denies"} Difficulty getting out of chair: {ACTIONS;DENIES/REPORTS:21021675::"Denies"} Difficulty using hands for taps, buttons, cutlery, and/or writing: {ACTIONS;DENIES/REPORTS:21021675::"Denies"}  No Rheumatology ROS completed.   PMFS History:  Patient Active Problem List   Diagnosis Date Noted  . History of wheezing 03/18/2020  . Osteopenia of multiple sites 10/30/2019  . Prediabetes 05/14/2018  . Essential hypertension, benign 05/14/2018  . Class 3 severe obesity due to excess calories with serious comorbidity and body mass index (BMI) of 40.0 to 44.9 in adult ([place]) 05/14/2018  . 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 tophus 07/20/2016  . Primary osteoarthritis of both hands 07/20/2016  . Primary osteoarthritis of both knees 07/20/2016  . Primary osteoarthritis of both feet 07/20/2016  . DJD (degenerative joint disease), cervical 07/20/2016  . Elevated CK 07/20/2016  . History of humerus fracture 07/20/2016  . History of hypertension 07/20/2016  . History of diabetes mellitus 07/20/2016  . History of hyperlipidemia 07/20/2016  . Chronic rhinitis 10/20/2015  . Anosmia 10/20/2015  . Mild intermittent asthma 10/20/2015    Past Medical History:  Diagnosis Date  . Arthritis   . Diabetes mellitus without complication ([place])   . Gout   .  Hypertension   . Partial retinal tear of both eyes without detachment 08/29/2017    Family History  Problem Relation Age of Onset  . Heart disease Mother   . Hypertension Mother   . Diabetes Mother   . Arthritis Father   . Hypertension Father   . Prostate cancer Father   . Parkinson's disease Father   . Heart disease Father   . Sinusitis Sister   . Allergic rhinitis Sister   . Diabetes Sister   . High Cholesterol Sister   . Hypertension Sister   . Diabetes Brother   . Hypertension Brother   . High Cholesterol Brother   . Hypertension Brother   . Angioedema Neg Hx   . Asthma Neg Hx   . Eczema Neg Hx   . Immunodeficiency Neg Hx   . Urticaria Neg Hx   . Breast cancer Neg Hx    Past Surgical History:  Procedure Laterality Date  . ABDOMINAL HYSTERECTOMY  1991  . CARPAL TUNNEL RELEASE Right 2019  . COLONOSCOPY     5 between 1994-2010  . Left knee surgery  2006  . ROTATOR CUFF REPAIR Left 2001  . ROTATOR CUFF REPAIR Right 2003  . SHOULDER SURGERY  2001/2003  . TONSILECTOMY/ADENOIDECTOMY WITH MYRINGOTOMY  1971   Social History   Social History Narrative  . Not on file   Immunization History  Administered Date(s) Administered  . Influenza, High Dose Seasonal PF 02/05/2017, 02/12/2018, 12/16/2018, 01/13/2020  . Influenza,inj,quad, With Preservative 02/13/2017, 02/01/2018  . Influenza-Unspecified 02/03/2015, 01/18/2018, 01/29/2018, 12/16/2018  . PFIZER SARS-COV-2 Vaccination 01/01/2019, 01/23/2019  . Pneumococcal Conjugate-13 01/20/2014  . Pneumococcal-Unspecified 02/14/2017  . Unspecified SARS-COV-2 Vaccination 08/20/2019  .  Zoster Recombinat (Shingrix) 10/18/2019     Objective: Vital Signs: There were no vitals taken for this visit.   Physical Exam   Musculoskeletal Exam: ***  CDAI Exam: CDAI Score: -- Patient Global: --; Provider Global: -- Swollen: --; Tender: -- Joint Exam 05/06/2020   No joint exam has been documented for this visit   There is  currently no information documented on the homunculus. Go to the Rheumatology activity and complete the homunculus joint exam.  Investigation: No additional findings.  Imaging: No results found.  Recent Labs: Lab Results  Component Value Date   WBC 10.4 02/04/2020   HGB 12.7 02/04/2020   PLT 316 02/04/2020   NA 142 02/04/2020   K 3.8 02/04/2020   CL 107 02/04/2020   CO2 30 02/04/2020   GLUCOSE 93 02/04/2020   BUN 20 02/04/2020   CREATININE 0.87 02/04/2020   BILITOT 0.3 02/04/2020   ALKPHOS 79 07/15/2019   AST 17 02/04/2020   ALT 13 02/04/2020   PROT 6.5 02/04/2020   ALBUMIN 4.6 07/15/2019   CALCIUM 9.5 02/04/2020   GFRAA 78 02/04/2020    Speciality Comments: No specialty comments available.  Procedures:  No procedures performed Allergies: Codeine and Penicillins   Assessment / Plan:     Visit Diagnoses: No diagnosis found.  Orders: No orders of the defined types were placed in this encounter.  No orders of the defined types were placed in this encounter.   Face-to-face time spent with patient was *** minutes. Greater than 50% of time was spent in counseling and coordination of care.  Follow-Up Instructions: No follow-ups on file.   Earnestine Mealing, CMA  Note - This record has been created using Editor, commissioning.  Chart creation errors have been sought, but may not always  have been located. Such creation errors do not reflect on  the standard of medical care.

## 2020-04-26 DIAGNOSIS — Z03818 Encounter for observation for suspected exposure to other biological agents ruled out: Secondary | ICD-10-CM | POA: Diagnosis not present

## 2020-05-04 ENCOUNTER — Ambulatory Visit: Payer: Medicare Other | Admitting: Internal Medicine

## 2020-05-04 NOTE — Progress Notes (Signed)
Office Visit Note  Patient: Robin Odom             Date of Birth: 04/29/1951           MRN: [id number]             PCP: Glendale Chard, MD Referring: Glendale Chard, MD Visit Date: 05/12/2020 Occupation: @GUAROCC @  Subjective:  Medication monitoring   History of Present Illness: Robin Odom is a 70 y.o. female with history of gout, osteoarthritis, and DDD. She is taking allopurinol 300 mg 1 tablet by mouth daily.  She denies any recent gout flares.  She has not needed to take colchicine in several years.  Patient reports that she avoids eating shellfish and rarely eats red meat. Patient reports that she has occasional arthralgias which she attributes to changes in barometric pressure.  She denies any increased joint pain or swelling at this time.  She continues to take omega-3 and turmeric for other natural anti-inflammatory properties.  She continues to exercise 5 days a week.  She has started an exercise stretching class which she has found very helpful at managing her arthralgias.    Activities of Daily Living:  Patient reports morning stiffness for 0  minutes.   Patient Denies nocturnal pain.  Difficulty dressing/grooming: Denies Difficulty climbing stairs: Denies Difficulty getting out of chair: Denies Difficulty using hands for taps, buttons, cutlery, and/or writing: Denies  Review of Systems  Constitutional: Negative for fatigue.  HENT: Negative for mouth sores, mouth dryness and nose dryness.   Eyes: Negative for pain, visual disturbance and dryness.  Respiratory: Negative for cough, hemoptysis, shortness of breath and difficulty breathing.   Cardiovascular: Positive for swelling in legs/feet. Negative for chest pain, palpitations and hypertension.  Gastrointestinal: Negative for blood in stool, constipation and diarrhea.  Endocrine: Negative for increased urination.  Genitourinary: Negative for painful urination.  Musculoskeletal: Negative for arthralgias, joint pain, joint  swelling, myalgias, muscle weakness, morning stiffness, muscle tenderness and myalgias.  Skin: Negative for color change, pallor, rash, hair loss, nodules/bumps, skin tightness, ulcers and sensitivity to sunlight.  Allergic/Immunologic: Negative for susceptible to infections.  Neurological: Negative for dizziness, numbness, headaches and weakness.  Hematological: Negative for swollen glands.  Psychiatric/Behavioral: Negative for depressed mood and sleep disturbance. The patient is not nervous/anxious.     PMFS History:  Patient Active Problem List   Diagnosis Date Noted  . History of wheezing 03/18/2020  . Osteopenia of multiple sites 10/30/2019  . Prediabetes 05/14/2018  . Essential hypertension, benign 05/14/2018  . Class 3 severe obesity due to excess calories with serious comorbidity and body mass index (BMI) of 40.0 to 44.9 in adult ([place]) 05/14/2018  . 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 tophus 07/20/2016  . Primary osteoarthritis of both hands 07/20/2016  . Primary osteoarthritis of both knees 07/20/2016  . Primary osteoarthritis of both feet 07/20/2016  . DJD (degenerative joint disease), cervical 07/20/2016  . Elevated CK 07/20/2016  . History of humerus fracture 07/20/2016  . History of hypertension 07/20/2016  . History of diabetes mellitus 07/20/2016  . History of hyperlipidemia 07/20/2016  . Chronic rhinitis 10/20/2015  . Anosmia 10/20/2015  . Mild intermittent asthma 10/20/2015    Past Medical History:  Diagnosis Date  . Arthritis   . Diabetes mellitus without complication ([place])   . Gout   . Hypertension   . Partial retinal tear of both eyes without detachment 08/29/2017    Family History  Problem Relation Age of Onset  .  Heart disease Mother   . Hypertension Mother   . Diabetes Mother   . Arthritis Father   . Hypertension Father   . Prostate cancer Father   . Parkinson's disease Father   . Heart disease Father   . Sinusitis Sister   .  Allergic rhinitis Sister   . Diabetes Sister   . High Cholesterol Sister   . Hypertension Sister   . Diabetes Brother   . Hypertension Brother   . High Cholesterol Brother   . Hypertension Brother   . Angioedema Neg Hx   . Asthma Neg Hx   . Eczema Neg Hx   . Immunodeficiency Neg Hx   . Urticaria Neg Hx   . Breast cancer Neg Hx    Past Surgical History:  Procedure Laterality Date  . ABDOMINAL HYSTERECTOMY  1991  . CARPAL TUNNEL RELEASE Right 2019  . COLONOSCOPY     5 between 1994-2010  . Left knee surgery  2006  . ROTATOR CUFF REPAIR Left 2001  . ROTATOR CUFF REPAIR Right 2003  . SHOULDER SURGERY  2001/2003  . TONSILECTOMY/ADENOIDECTOMY WITH MYRINGOTOMY  1971   Social History   Social History Narrative  . Not on file   Immunization History  Administered Date(s) Administered  . Influenza, High Dose Seasonal PF 02/05/2017, 02/12/2018, 12/16/2018, 01/13/2020  . Influenza,inj,quad, With Preservative 02/13/2017, 02/01/2018  . Influenza-Unspecified 02/03/2015, 01/18/2018, 01/29/2018, 12/16/2018  . PFIZER SARS-COV-2 Vaccination 01/01/2019, 01/23/2019, 08/19/2019  . Pneumococcal Conjugate-13 01/20/2014  . Pneumococcal-Unspecified 02/14/2017  . Zoster Recombinat (Shingrix) 10/18/2019     Objective: Vital Signs: BP 132/84 (BP Location: Left Arm, Patient Position: Sitting, Cuff Size: Large)   Pulse 70   Resp 17   Ht 5\' 5"  (1.651 m)   Wt 247 lb 3.2 oz (112.1 kg)   BMI 41.14 kg/m    Physical Exam Vitals and nursing note reviewed.  Constitutional:      Appearance: She is well-developed and well-nourished.  HENT:     Head: Normocephalic and atraumatic.  Eyes:     Extraocular Movements: EOM normal.     Conjunctiva/sclera: Conjunctivae normal.  Cardiovascular:     Pulses: Intact distal pulses.  Pulmonary:     Effort: Pulmonary effort is normal.  Abdominal:     Palpations: Abdomen is soft.  Musculoskeletal:     Cervical back: Normal range of motion.  Skin:     General: Skin is warm and dry.     Capillary Refill: Capillary refill takes less than 2 seconds.  Neurological:     Mental Status: She is alert and oriented to person, place, and time.  Psychiatric:        Mood and Affect: Mood and affect normal.        Behavior: Behavior normal.      Musculoskeletal Exam: C-spine, thoracic spine, lumbar spine good range of motion with no discomfort.  No midline spinal tenderness.  No SI joint tenderness.  Shoulder joints, elbow joints, wrist joints, MCPs, PIPs, DIPs have good range of motion with no synovitis.  She is able to make a complete fist bilaterally.  Hip joints have good range of motion with no discomfort.  No tenderness over trochanteric bursa bilaterally.  Knee joints have good range of motion with no warmth or effusion.  Ankle joints have good range of motion with no tenderness or inflammation.  Pedal edema noted bilaterally.  Bunion formation on the right first MTP noted.  Hammertoes of the right foot noted.  PIP and DIP  thickening consistent with osteoarthritis of both feet were noted.   CDAI Exam: CDAI Score: -- Patient Global: --; Provider Global: -- Swollen: --; Tender: -- Joint Exam 05/12/2020   No joint exam has been documented for this visit   There is currently no information documented on the homunculus. Go to the Rheumatology activity and complete the homunculus joint exam.  Investigation: No additional findings.  Imaging: No results found.  Recent Labs: Lab Results  Component Value Date   WBC 10.4 02/04/2020   HGB 12.7 02/04/2020   PLT 316 02/04/2020   NA 142 02/04/2020   K 3.8 02/04/2020   CL 107 02/04/2020   CO2 30 02/04/2020   GLUCOSE 93 02/04/2020   BUN 20 02/04/2020   CREATININE 0.87 02/04/2020   BILITOT 0.3 02/04/2020   ALKPHOS 79 07/15/2019   AST 17 02/04/2020   ALT 13 02/04/2020   PROT 6.5 02/04/2020   ALBUMIN 4.6 07/15/2019   CALCIUM 9.5 02/04/2020   GFRAA 78 02/04/2020    Speciality Comments: No  specialty comments available.  Procedures:  No procedures performed Allergies: Codeine and Penicillins   Assessment / Plan:     Visit Diagnoses: 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 tophus -She has not had any signs or symptoms of a gout flare.  No tophi noted.  She has no joint tenderness or inflammation on exam.  She is clinically doing well taking allopurinol 300 mg 1 tablet by mouth daily.  She has not needed to take colchicine in several years.  She remains compliant with dietary restrictions.  She does not eat shellfish and very rarely eats red meat.  Uric acid level was 4.6 on 02/04/20, which is within the desirable range. She will be due to update lab work in April and every 6 months.  Future orders for CBC, CMP, and uric acid placed today.  She will continue to take allopurinol 300 mg 1 tablet by mouth daily.  She does not need a refill at this time.  She was advised to notify us if she develops signs or symptoms of a flare.  She will follow up in 6 months.   - Plan: COMPLETE METABOLIC PANEL WITH GFR, CBC with Differential/Platelet, Uric acid  Medication monitoring encounter - Uric acid: 02/04/2020 4.6. CBC and CMP updated on 02/04/20.  She will be due to update lab work in April and every 6 months to monitor for drug toxicity.  Future orders for CBC, CMP, and uric acid were placed today.- Plan: COMPLETE METABOLIC PANEL WITH GFR, CBC with Differential/Platelet, Uric acid  Primary osteoarthritis of both hands: She experiences intermittent pain and stiffness in both hands especially in her right thumb.  She has no tenderness palpation over the right CMC or first MCP joint on examination today.  She is able to make a complete fist bilaterally.  No synovitis was noted.  We discussed the importance of joint protection and muscle strengthening.  Primary osteoarthritis of both knees: She has good range of motion of both knee joints on examination today.  No warmth or effusion was noted.  She  has occasional discomfort in her right knee but has no pain or inflammation today.  Primary osteoarthritis of both feet: She has PIP and DIP thickening consistent with osteoarthritis of both feet.  Hammertoes and bunion formation of the right foot noted on exam.  Pedal edema noted bilaterally.  Discussed the importance of wearing proper fitting shoes.  DDD (degenerative disc disease), cervical - She has good  range of motion of the C-spine on exam today.  She is not having any discomfort or symptoms of radiculopathy.  Osteopenia of multiple sites - 10/29/2019 bone density report from [facility] showed T score of -2.1 in the left femoral neck consistent with osteopenia.  She is taking a calcium and vitamin D supplement as recommended.  She has not had any recent falls or fractures.  She exercises 5 days a week with 2 of those days devoted to weight training.    History of humerus fracture: She has occasional discomfort in her right shoulder.  She has full ROM of the right shoulder with no discomfort on exam today.   Other medical conditions are listed as follows:   History of diabetes mellitus  History of hypertension  History of hyperlipidemia  Orders: Orders Placed This Encounter  Procedures  . COMPLETE METABOLIC PANEL WITH GFR  . CBC with Differential/Platelet  . Uric acid   No orders of the defined types were placed in this encounter.     Follow-Up Instructions: Return in about 6 months (around 11/09/2020) for Gout, Osteoarthritis, DDD.   Ofilia Neas, PA-C  Note - This record has been created using Dragon software.  Chart creation errors have been sought, but may not always  have been located. Such creation errors do not reflect on  the standard of medical care.

## 2020-05-05 ENCOUNTER — Ambulatory Visit: Payer: Medicare Other | Admitting: Internal Medicine

## 2020-05-06 ENCOUNTER — Ambulatory Visit: Payer: Medicare Other | Admitting: Rheumatology

## 2020-05-12 ENCOUNTER — Ambulatory Visit (INDEPENDENT_AMBULATORY_CARE_PROVIDER_SITE_OTHER): Payer: Medicare Other | Admitting: Physician Assistant

## 2020-05-12 ENCOUNTER — Encounter: Payer: Self-pay | Admitting: Physician Assistant

## 2020-05-12 ENCOUNTER — Other Ambulatory Visit: Payer: Self-pay

## 2020-05-12 VITALS — BP 132/84 | HR 70 | Resp 17 | Ht 65.0 in | Wt 247.2 lb

## 2020-05-12 DIAGNOSIS — M19071 Primary osteoarthritis, right ankle and foot: Secondary | ICD-10-CM | POA: Diagnosis not present

## 2020-05-12 DIAGNOSIS — M1A09X0 Idiopathic chronic gout, multiple sites, without tophus (tophi): Secondary | ICD-10-CM

## 2020-05-12 DIAGNOSIS — Z8781 Personal history of (healed) traumatic fracture: Secondary | ICD-10-CM | POA: Diagnosis not present

## 2020-05-12 DIAGNOSIS — M19041 Primary osteoarthritis, right hand: Secondary | ICD-10-CM

## 2020-05-12 DIAGNOSIS — M170 Bilateral primary osteoarthritis of knee: Secondary | ICD-10-CM | POA: Diagnosis not present

## 2020-05-12 DIAGNOSIS — Z8639 Personal history of other endocrine, nutritional and metabolic disease: Secondary | ICD-10-CM

## 2020-05-12 DIAGNOSIS — M5030 Other cervical disc degeneration, unspecified cervical region: Secondary | ICD-10-CM

## 2020-05-12 DIAGNOSIS — M8589 Other specified disorders of bone density and structure, multiple sites: Secondary | ICD-10-CM

## 2020-05-12 DIAGNOSIS — M19072 Primary osteoarthritis, left ankle and foot: Secondary | ICD-10-CM

## 2020-05-12 DIAGNOSIS — M19042 Primary osteoarthritis, left hand: Secondary | ICD-10-CM

## 2020-05-12 DIAGNOSIS — Z8679 Personal history of other diseases of the circulatory system: Secondary | ICD-10-CM | POA: Diagnosis not present

## 2020-05-12 DIAGNOSIS — Z5181 Encounter for therapeutic drug level monitoring: Secondary | ICD-10-CM

## 2020-05-12 NOTE — Patient Instructions (Signed)
Standing Labs We placed an order today for your standing lab work.   Please have your standing labs drawn in April  If possible, please have your labs drawn 2 weeks prior to your appointment so that the provider can discuss your results at your appointment.  We have open lab daily Monday through Thursday from 8:30-12:30 PM and 1:30-4:30 PM and Friday from 8:30-12:30 PM and 1:30-4:00 PM at the office of Dr. Shaili Deveshwar, [facility].   Please be advised, patients with office appointments requiring lab work will take precedents over walk-in lab work.  If possible, please come for your lab work on Monday and Friday afternoons, as you may experience shorter wait times. The office is located at [address], [address], [place], [place] [address] No appointment is necessary.   Labs are drawn by [facility]. Please bring your co-pay at the time of your lab draw.  You may receive a bill from [facility] for your lab work.  If you wish to have your labs drawn at another location, please call the office 24 hours in advance to send orders.  If you have any questions regarding directions or hours of operation,  please call [phone].   As a reminder, please drink plenty of water prior to coming for your lab work. Thanks!  

## 2020-05-13 ENCOUNTER — Ambulatory Visit (INDEPENDENT_AMBULATORY_CARE_PROVIDER_SITE_OTHER): Payer: Medicare HMO | Admitting: Internal Medicine

## 2020-05-13 ENCOUNTER — Other Ambulatory Visit: Payer: Self-pay

## 2020-05-13 ENCOUNTER — Encounter: Payer: Self-pay | Admitting: Internal Medicine

## 2020-05-13 ENCOUNTER — Ambulatory Visit (INDEPENDENT_AMBULATORY_CARE_PROVIDER_SITE_OTHER): Payer: Medicare HMO

## 2020-05-13 VITALS — BP 158/90 | HR 79 | Temp 98.7°F | Ht 64.6 in | Wt 247.0 lb

## 2020-05-13 VITALS — BP 158/90 | HR 79 | Temp 98.0°F | Ht 64.6 in | Wt 247.0 lb

## 2020-05-13 DIAGNOSIS — Z0000 Encounter for general adult medical examination without abnormal findings: Secondary | ICD-10-CM

## 2020-05-13 DIAGNOSIS — Z6841 Body Mass Index (BMI) 40.0 and over, adult: Secondary | ICD-10-CM

## 2020-05-13 DIAGNOSIS — I10 Essential (primary) hypertension: Secondary | ICD-10-CM

## 2020-05-13 DIAGNOSIS — Z23 Encounter for immunization: Secondary | ICD-10-CM | POA: Diagnosis not present

## 2020-05-13 DIAGNOSIS — R7303 Prediabetes: Secondary | ICD-10-CM

## 2020-05-13 LAB — POCT UA - MICROALBUMIN
Albumin/Creatinine Ratio, Urine, POC: <30
Creatinine, POC: 50 mg/dL
Microalbumin Ur, POC: 10 mg/L

## 2020-05-13 LAB — POCT URINALYSIS DIPSTICK
Bilirubin, UA: NEGATIVE
Blood, UA: NEGATIVE
Glucose, UA: NEGATIVE
Ketones, UA: NEGATIVE
Leukocytes, UA: NEGATIVE
Nitrite, UA: NEGATIVE
Protein, UA: NEGATIVE
Spec Grav, UA: 1.02 (ref 1.010–1.025)
Urobilinogen, UA: 0.2 E.U./dL
pH, UA: 7 (ref 5.0–8.0)

## 2020-05-13 MED ORDER — BOOSTRIX 5-2.5-18.5 LF-MCG/0.5 IM SUSP: 0.5000 mL | Freq: Once | INTRAMUSCULAR | 0 refills | Status: AC

## 2020-05-13 NOTE — Progress Notes (Signed)
This visit occurred during the SARS-CoV-2 public health emergency.  Safety protocols were in place, including screening questions prior to the visit, additional usage of staff PPE, and extensive cleaning of exam room while observing appropriate contact time as indicated for disinfecting solutions.  Subjective:   Robin Odom is a 70 y.o. female who presents for Medicare Annual (Subsequent) preventive examination.  Review of Systems     Cardiac Risk Factors include: advanced age (>69men, >51 women);hypertension;obesity (BMI >30kg/m2)     Objective:    Today's Vitals   05/13/20 1012  BP: (!) 158/90  Pulse: 79  Temp: 98 F (36.7 C)  TempSrc: Oral  SpO2: 93%  Weight: 247 lb (112 kg)  Height: 5' 4.6" (1.641 m)   Body mass index is 41.61 kg/m.  Advanced Directives 05/13/2020 04/16/2019 04/04/2018  Does Patient Have a Medical Advance Directive? Yes Yes Yes  Type of Paramedic of [place];Living will [place];Living will [place];Living will  Does patient want to make changes to medical advance directive? - - No - Patient declined  Copy of [place] in Chart? No - copy requested No - copy requested No - copy requested    Current Medications (verified) Outpatient Encounter Medications as of 05/13/2020  Medication Sig  . allopurinol (ZYLOPRIM) 300 MG tablet TAKE 1 TABLET BY MOUTH EVERY DAY  . amLODipine (NORVASC) 5 MG tablet TAKE 1 TABLET BY MOUTH EVERY DAY  . Ascorbic Acid (VITAMIN C PO) Take 2,000 mg by mouth daily.  Marland Kitchen azelastine (ASTELIN) 0.1 % nasal spray 1-2 sprays each nostril twice daily as needed  . B Complex Vitamins (VITAMIN-B COMPLEX) TABS Take by mouth.  . BENFOTIAMINE PO Take by mouth 2 (two) times daily.  . Calcium Carbonate-Vit D-Min (CALCIUM 1200 PO) Take 600 mg by mouth daily.   . Cholecalciferol (VITAMIN D3) 3000 units TABS Take by mouth.   . colchicine 0.6 MG tablet Take 0.6 mg by mouth  as needed.  . furosemide (LASIX) 20 MG tablet TAKE 1 TABLET BY MOUTH EVERY DAY  . hydrALAZINE (APRESOLINE) 25 MG tablet TAKE 1 TABLET BY MOUTH TWICE A DAY  . magnesium oxide (MAG-OX) 400 MG tablet Take 400 mg by mouth daily.  . Melatonin 5 MG CAPS Take by mouth at bedtime as needed.   . metoprolol succinate (TOPROL-XL) 50 MG 24 hr tablet Take 1 tablet (50 mg total) by mouth daily. Take with or immediately following a meal.  . Multiple Vitamin (MULTIVITAMIN+) LIQD Take by mouth.  . olmesartan (BENICAR) 40 MG tablet TAKE 1 TABLET BY MOUTH EVERY DAY  . omega-3 acid ethyl esters (LOVAZA) 1 g capsule Take 2 g by mouth 2 (two) times daily. Reported on 10/20/2015  . potassium chloride (MICRO-K) 10 MEQ CR capsule TAKE 1 CAPSULE BY MOUTH EVERY DAY  . TURMERIC CURCUMIN PO Take by mouth daily.  . Zinc 50 MG TABS Take 1 tablet by mouth daily.  . fluticasone (FLOVENT HFA) 110 MCG/ACT inhaler Inhale 2 puffs into the lungs 2 (two) times daily. (Patient not taking: Reported on 05/13/2020)  . ipratropium (ATROVENT) 0.06 % nasal spray Place 2 sprays into both nostrils as needed for rhinitis. (Patient not taking: Reported on 05/13/2020)   No facility-administered encounter medications on file as of 05/13/2020.    Allergies (verified) Codeine and Penicillins   History: Past Medical History:  Diagnosis Date  . Arthritis   . Diabetes mellitus without complication ([place])   . Gout   .  Hypertension   . Partial retinal tear of both eyes without detachment 08/29/2017   Past Surgical History:  Procedure Laterality Date  . ABDOMINAL HYSTERECTOMY  1991  . CARPAL TUNNEL RELEASE Right 2019  . COLONOSCOPY     5 between 1994-2010  . Left knee surgery  2006  . ROTATOR CUFF REPAIR Left 2001  . ROTATOR CUFF REPAIR Right 2003  . SHOULDER SURGERY  2001/2003  . TONSILECTOMY/ADENOIDECTOMY WITH MYRINGOTOMY  1971   Family History  Problem Relation Age of Onset  . Heart disease Mother   . Hypertension Mother   .  Diabetes Mother   . Arthritis Father   . Hypertension Father   . Prostate cancer Father   . Parkinson's disease Father   . Heart disease Father   . Sinusitis Sister   . Allergic rhinitis Sister   . Diabetes Sister   . High Cholesterol Sister   . Hypertension Sister   . Diabetes Brother   . Hypertension Brother   . High Cholesterol Brother   . Hypertension Brother   . Angioedema Neg Hx   . Asthma Neg Hx   . Eczema Neg Hx   . Immunodeficiency Neg Hx   . Urticaria Neg Hx   . Breast cancer Neg Hx    Social History   Socioeconomic History  . Marital status: Single    Spouse name: Not on file  . Number of children: Not on file  . Years of education: Not on file  . Highest education level: Not on file  Occupational History  . Occupation: retired  Tobacco Use  . Smoking status: Never Smoker  . Smokeless tobacco: Never Used  Vaping Use  . Vaping Use: Never used  Substance and Sexual Activity  . Alcohol use: Yes    Comment: rarely  . Drug use: No  . Sexual activity: Not Currently    Birth control/protection: None    Comment: Hysterectomy  Other Topics Concern  . Not on file  Social History Narrative  . Not on file   Social Determinants of Health   Financial Resource Strain: Low Risk   . Difficulty of Paying Living Expenses: Not hard at all  Food Insecurity: No Food Insecurity  . Worried About Charity fundraiser in the Last Year: Never true  . Ran Out of Food in the Last Year: Never true  Transportation Needs: No Transportation Needs  . Lack of Transportation (Medical): No  . Lack of Transportation (Non-Medical): No  Physical Activity: Sufficiently Active  . Days of Exercise per Week: 5 days  . Minutes of Exercise per Session: 30 min  Stress: No Stress Concern Present  . Feeling of Stress : Only a little  Social Connections: Not on file    Tobacco Counseling Counseling given: Not Answered   Clinical Intake:  Pre-visit preparation completed: Yes  Pain :  No/denies pain     Nutritional Status: BMI > 30  Obese Nutritional Risks: None Diabetes: No  How often do you need to have someone help you when you read instructions, pamphlets, or other written materials from your doctor or pharmacy?: 1 - Never What is the last grade level you completed in school?: [name]  Diabetic? no  Interpreter Needed?: No  Information entered by :: NAllen LPN   Activities of Daily Living In your present state of health, do you have any difficulty performing the following activities: 05/13/2020  Hearing? N  Vision? N  Difficulty concentrating or making decisions? N  Walking or climbing stairs? N  Dressing or bathing? N  Doing errands, shopping? N  Preparing Food and eating ? N  Using the Toilet? N  In the past six months, have you accidently leaked urine? Y  Comment held too long  Do you have problems with loss of bowel control? N  Managing your Medications? N  Managing your Finances? N  Housekeeping or managing your Housekeeping? N  Some recent data might be hidden    Patient Care Team: Glendale Chard, MD as PCP - General (Internal Medicine) Rex Kras, Claudette Stapler, RN as [place] any recent [place] you may have received from other than [facility] providers in the past year (date may be approximate).     Assessment:   This is a routine wellness examination for Robin Odom.  Hearing/Vision screen No exam data present  Dietary issues and exercise activities discussed: Current Exercise Habits: Structured exercise class, Time (Minutes): 30, Frequency (Times/Week): 5, Weekly Exercise (Minutes/Week): 150  Goals    .  Patient Stated      05/13/2020, continue exercise regimen, lose 17 covid pounds. Play golf    .  Weight (lb) < 200 lb (90.7 kg) (pt-stated)      Wants to lose 20 pounds and exercise 5 days a week    .  Weight (lb) < 200 lb (90.7 kg)      04/16/2019, would like to lose 20 pounds gained during covid       Depression Screen PHQ 2/9 Scores 05/13/2020 04/16/2019 07/11/2018 05/14/2018 04/10/2018 04/04/2018  PHQ - 2 Score 0 0 0 0 0 0  PHQ- 9 Score - 3 - - - -    Fall Risk Fall Risk  05/13/2020 04/16/2019 11/27/2018 07/11/2018 05/14/2018  Falls in the past year? 0 0 0 0 0  Comment - - - - -  Risk for fall due to : Medication side effect Medication side effect - - -  Follow up Falls evaluation completed;Education provided;Falls prevention discussed Education provided;Falls evaluation completed;Falls prevention discussed - - -    FALL RISK PREVENTION PERTAINING TO THE HOME:  Any stairs in or around the home? Yes  If so, are there any without handrails? No  Home free of loose throw rugs in walkways, pet beds, electrical cords, etc? Yes  Adequate lighting in your home to reduce risk of falls? Yes   ASSISTIVE DEVICES UTILIZED TO PREVENT FALLS:  Life alert? No  Use of a cane, walker or w/c? No  Grab bars in the bathroom? No  Shower chair or bench in shower? No  Elevated toilet seat or a handicapped toilet? Yes   TIMED UP AND GO:  Was the test performed? No .    Gait steady and fast without use of assistive device  Cognitive Function:     6CIT Screen 05/13/2020 04/16/2019 04/04/2018  What Year? 0 points 0 points 0 points  What month? 0 points 0 points 0 points  What time? 0 points 0 points 0 points  Count back from 20 0 points 0 points 0 points  Months in reverse 0 points 0 points 0 points  Repeat phrase 0 points 0 points 0 points  Total Score 0 0 0    Immunizations Immunization History  Administered Date(s) Administered  . Influenza, High Dose Seasonal PF 02/05/2017, 02/12/2018, 12/16/2018, 01/13/2020  . Influenza,inj,quad, With Preservative 02/13/2017, 02/01/2018  . Influenza-Unspecified 02/03/2015, 01/18/2018, 01/29/2018, 12/16/2018  . PFIZER SARS-COV-2 Vaccination 01/01/2019, 01/23/2019, 08/19/2019  .  Pneumococcal Conjugate-13 01/20/2014  . Pneumococcal-Unspecified 02/14/2017   . Zoster Recombinat (Shingrix) 10/18/2019    TDAP status: Due, Education has been provided regarding the importance of this vaccine. Advised may receive this vaccine at local pharmacy or Health Dept. Aware to provide a copy of the vaccination record if obtained from local pharmacy or Health Dept. Verbalized acceptance and understanding.  Flu Vaccine status: Up to date  Pneumococcal vaccine status: Up to date  Covid-19 vaccine status: Completed vaccines  Qualifies for Shingles Vaccine? Yes   Zostavax completed No   Shingrix Completed?: Yes  Screening Tests Health Maintenance  Topic Date Due  . FOOT EXAM  Never done  . TETANUS/TDAP  08/13/2019  . OPHTHALMOLOGY EXAM  11/11/2019  . HEMOGLOBIN A1C  05/19/2020  . MAMMOGRAM  01/26/2022  . COLONOSCOPY (Pts 45-79yrs Insurance coverage will need to be confirmed)  02/18/2023  . INFLUENZA VACCINE  Completed  . DEXA SCAN  Completed  . COVID-19 Vaccine  Completed  . Hepatitis C Screening  Completed  . PNA vac Low Risk Adult  Completed    Health Maintenance  Health Maintenance Due  Topic Date Due  . FOOT EXAM  Never done  . TETANUS/TDAP  08/13/2019  . OPHTHALMOLOGY EXAM  11/11/2019    Colorectal cancer screening: Type of screening: Colonoscopy. Completed 02/18/2020. Repeat every 3 years  Mammogram status: Completed 01/27/2020. Repeat every year  Bone Density status: Completed 10/29/2019. Results reflect: Bone density results: OSTEOPENIA. Repeat every 2 years.  Lung Cancer Screening: (Low Dose CT Chest recommended if Age 69-80 years, 30 pack-year currently smoking OR have quit w/in 15years.) does not qualify.   Lung Cancer Screening Referral: no  Additional Screening:  Hepatitis C Screening: does qualify; Completed 10/10/2012  Vision Screening: Recommended annual ophthalmology exams for early detection of glaucoma and other disorders of the eye. Is the patient up to date with their annual eye exam?  Yes  Who is the provider or  what is the name of the office in which the patient attends annual eye exams? Dr. Iona Hansen If pt is not established with a provider, would they like to be referred to a provider to establish care? No .   Dental Screening: Recommended annual dental exams for proper oral hygiene  Community Resource Referral / Chronic Care Management: CRR required this visit?  No   CCM required this visit?  No      Plan:     I have personally reviewed and noted the following in the patient's chart:   . Medical and social history . Use of alcohol, tobacco or illicit drugs  . Current medications and supplements . Functional ability and status . Nutritional status . Physical activity . Advanced directives . List of other physicians . Hospitalizations, surgeries, and ER visits in previous 12 months . Vitals . Screenings to include cognitive, depression, and falls . Referrals and appointments  In addition, I have reviewed and discussed with patient certain preventive protocols, quality metrics, and best practice recommendations. A written personalized care plan for preventive services as well as general preventive health recommendations were provided to patient.     Kellie Simmering, LPN   [id number]   Nurse Notes:

## 2020-05-13 NOTE — Patient Instructions (Signed)

## 2020-05-13 NOTE — Progress Notes (Signed)
I,Katawbba Wiggins,acting as a Education administrator for Maximino Greenland, MD.,have documented all relevant documentation on the behalf of Maximino Greenland, MD,as directed by  Maximino Greenland, MD while in the presence of Maximino Greenland, MD.  This visit occurred during the SARS-CoV-2 public health emergency.  Safety protocols were in place, including screening questions prior to the visit, additional usage of staff PPE, and extensive cleaning of exam room while observing appropriate contact time as indicated for disinfecting solutions.  Subjective:     Patient ID: Robin Odom , female    DOB: [birth year] , 70 y.o.   MRN: [id number]   Chief Complaint  Patient presents with  . Hypertension    HPI  She is here today for a bp check. She reports compliance with meds. She denies headaches, chest pain and palpitations. Admits her BP is elevated at home as well on occasion. However, typically in the 130s. She denies change in sleep/daily habits.   Hypertension This is a chronic problem. The current episode started more than 1 year ago. The problem has been gradually improving since onset. Risk factors for coronary artery disease include obesity and post-menopausal state. Past treatments include direct vasodilators, angiotensin blockers and diuretics.     Past Medical History:  Diagnosis Date  . Arthritis   . Diabetes mellitus without complication ([place])   . Gout   . Hypertension   . Partial retinal tear of both eyes without detachment 08/29/2017     Family History  Problem Relation Age of Onset  . Heart disease Mother   . Hypertension Mother   . Diabetes Mother   . Arthritis Father   . Hypertension Father   . Prostate cancer Father   . Parkinson's disease Father   . Heart disease Father   . Sinusitis Sister   . Allergic rhinitis Sister   . Diabetes Sister   . High Cholesterol Sister   . Hypertension Sister   . Diabetes Brother   . Hypertension Brother   . High Cholesterol Brother   . Hypertension  Brother   . Angioedema Neg Hx   . Asthma Neg Hx   . Eczema Neg Hx   . Immunodeficiency Neg Hx   . Urticaria Neg Hx   . Breast cancer Neg Hx      Current Outpatient Medications:  .  allopurinol (ZYLOPRIM) 300 MG tablet, TAKE 1 TABLET BY MOUTH EVERY DAY, Disp: 90 tablet, Rfl: 0 .  amLODipine (NORVASC) 5 MG tablet, TAKE 1 TABLET BY MOUTH EVERY DAY, Disp: 90 tablet, Rfl: 2 .  Ascorbic Acid (VITAMIN C PO), Take 2,000 mg by mouth daily., Disp: , Rfl:  .  azelastine (ASTELIN) 0.1 % nasal spray, 1-2 sprays each nostril twice daily as needed, Disp: 30 mL, Rfl: 5 .  B Complex Vitamins (VITAMIN-B COMPLEX) TABS, Take by mouth., Disp: , Rfl:  .  BENFOTIAMINE PO, Take by mouth 2 (two) times daily., Disp: , Rfl:  .  Calcium Carbonate-Vit D-Min (CALCIUM 1200 PO), Take 600 mg by mouth daily. , Disp: , Rfl:  .  Cholecalciferol (VITAMIN D3) 3000 units TABS, Take by mouth. , Disp: , Rfl:  .  colchicine 0.6 MG tablet, Take 0.6 mg by mouth as needed., Disp: , Rfl:  .  fluticasone (FLOVENT HFA) 110 MCG/ACT inhaler, Inhale 2 puffs into the lungs 2 (two) times daily. (Patient not taking: Reported on 05/13/2020), Disp: 1 Inhaler, Rfl: 5 .  furosemide (LASIX) 20 MG tablet, TAKE 1 TABLET BY MOUTH EVERY  DAY, Disp: 90 tablet, Rfl: 1 .  hydrALAZINE (APRESOLINE) 25 MG tablet, TAKE 1 TABLET BY MOUTH TWICE A DAY, Disp: 180 tablet, Rfl: 1 .  ipratropium (ATROVENT) 0.06 % nasal spray, Place 2 sprays into both nostrils as needed for rhinitis. (Patient not taking: Reported on 05/13/2020), Disp: , Rfl:  .  magnesium oxide (MAG-OX) 400 MG tablet, Take 400 mg by mouth daily., Disp: , Rfl:  .  Melatonin 5 MG CAPS, Take by mouth at bedtime as needed. , Disp: , Rfl:  .  metoprolol succinate (TOPROL-XL) 50 MG 24 hr tablet, Take 1 tablet (50 mg total) by mouth daily. Take with or immediately following a meal., Disp: 90 tablet, Rfl: 1 .  Multiple Vitamin (MULTIVITAMIN+) LIQD, Take by mouth., Disp: , Rfl:  .  olmesartan (BENICAR) 40 MG  tablet, TAKE 1 TABLET BY MOUTH EVERY DAY, Disp: 90 tablet, Rfl: 2 .  omega-3 acid ethyl esters (LOVAZA) 1 g capsule, Take 2 g by mouth 2 (two) times daily. Reported on 10/20/2015, Disp: , Rfl:  .  potassium chloride (MICRO-K) 10 MEQ CR capsule, TAKE 1 CAPSULE BY MOUTH EVERY DAY, Disp: 90 capsule, Rfl: 1 .  TURMERIC CURCUMIN PO, Take by mouth daily., Disp: , Rfl:  .  Zinc 50 MG TABS, Take 1 tablet by mouth daily., Disp: , Rfl:    Allergies  Allergen Reactions  . Codeine Other (See Comments)    Hallucinations  . Penicillins Hives     Review of Systems  Constitutional: Negative.   Respiratory: Negative.   Cardiovascular: Negative.   Gastrointestinal: Negative.   Musculoskeletal: Positive for arthralgias and back pain.  Psychiatric/Behavioral: Negative.   All other systems reviewed and are negative.    Today's Vitals   05/13/20 1104  BP: (!) 158/90  Pulse: 79  Temp: 98.7 F (37.1 C)  TempSrc: Oral  Weight: 247 lb (112 kg)  Height: 5' 4.6" (1.641 m)   Body mass index is 41.61 kg/m.  Wt Readings from Last 3 Encounters:  05/13/20 247 lb (112 kg)  05/13/20 247 lb (112 kg)  05/12/20 247 lb 3.2 oz (112.1 kg)   Objective:  Physical Exam Vitals and nursing note reviewed.  Constitutional:      Appearance: Normal appearance. She is obese.  HENT:     Head: Normocephalic and atraumatic.     Nose:     Comments: Masked     Mouth/Throat:     Comments: Masked  Cardiovascular:     Rate and Rhythm: Normal rate and regular rhythm.     Heart sounds: Normal heart sounds.  Pulmonary:     Breath sounds: Normal breath sounds.  Musculoskeletal:     Cervical back: Normal range of motion.  Skin:    General: Skin is warm.  Neurological:     General: No focal deficit present.     Mental Status: She is alert and oriented to person, place, and time.         Assessment And Plan:     1. Essential hypertension, benign Comments: Uncontrolled. Encouraged to follow low sodium diet. May  benefit from spironolactone if persistently elevated. I will check renal function today.  - BMP8+EGFR  2. Prediabetes Comments: I will check hba1c/insulin level today. Has not had much weight loss w/ Ozempic, a1c did improve. She may benefit from Rybelsus. - Hemoglobin A1c - Insulin, random([phone])  3. Class 3 severe obesity due to excess calories with serious comorbidity and body mass index (BMI) of 40.0 to  44.9 in adult [facility]) Comments: BMI 41. She is encouraged to resume her regular exercise regimen, aiming for at least 150 minutes of exercise per week.   Patient was given opportunity to ask questions. Patient verbalized understanding of the plan and was able to repeat key elements of the plan. All questions were answered to their satisfaction.   I, Maximino Greenland, MD, have reviewed all documentation for this visit. The documentation on 05/13/20 for the exam, diagnosis, procedures, and orders are all accurate and complete.  THE PATIENT IS ENCOURAGED TO PRACTICE SOCIAL DISTANCING DUE TO THE COVID-19 PANDEMIC.

## 2020-05-13 NOTE — Patient Instructions (Signed)
Robin Odom , Thank you for taking time to come for your Medicare Wellness Visit. I appreciate your ongoing commitment to your health goals. Please review the following plan we discussed and let me know if I can assist you in the future.   Screening recommendations/referrals: Colonoscopy: completed 02/18/2020, due 02/18/2023 Mammogram: completed 01/27/2020, due 01/26/2021 Bone Density: completed 10/29/2019, due 10/28/2021 Recommended yearly ophthalmology/optometry visit for glaucoma screening and checkup Recommended yearly dental visit for hygiene and checkup  Vaccinations: Influenza vaccine: completed 01/13/2020, due 11/29/2020 Pneumococcal vaccine: completed 02/14/2017 Tdap vaccine: sent to pharmacy Shingles vaccine: completed   Covid-19:08/19/2019, 01/23/2019, 01/01/2019  Advanced directives: Please bring a copy of your POA (Power of Attorney) and/or Living Will to your next appointment.   Conditions/risks identified: none  Next appointment: Follow up in one year for your annual wellness visit    Preventive Care 65 Years and Older, Female Preventive care refers to lifestyle choices and visits with your health care provider that can promote health and wellness. What does preventive care include?  A yearly physical exam. This is also called an annual well check.  Dental exams once or twice a year.  Routine eye exams. Ask your health care provider how often you should have your eyes checked.  Personal lifestyle choices, including:  Daily care of your teeth and gums.  Regular physical activity.  Eating a healthy diet.  Avoiding tobacco and drug use.  Limiting alcohol use.  Practicing safe sex.  Taking low-dose aspirin every day.  Taking vitamin and mineral supplements as recommended by your health care provider. What happens during an annual well check? The services and screenings done by your health care provider during your annual well check will depend on your age, overall  health, lifestyle risk factors, and family history of disease. Counseling  Your health care provider may ask you questions about your:  Alcohol use.  Tobacco use.  Drug use.  Emotional well-being.  Home and relationship well-being.  Sexual activity.  Eating habits.  History of falls.  Memory and ability to understand (cognition).  Work and work Statistician.  Reproductive health. Screening  You may have the following tests or measurements:  Height, weight, and BMI.  Blood pressure.  Lipid and cholesterol levels. These may be checked every 5 years, or more frequently if you are over 41 years old.  Skin check.  Lung cancer screening. You may have this screening every year starting at age 37 if you have a 30-pack-year history of smoking and currently smoke or have quit within the past 15 years.  Fecal occult blood test (FOBT) of the stool. You may have this test every year starting at age 15.  Flexible sigmoidoscopy or colonoscopy. You may have a sigmoidoscopy every 5 years or a colonoscopy every 10 years starting at age 56.  Hepatitis C blood test.  Hepatitis B blood test.  Sexually transmitted disease (STD) testing.  Diabetes screening. This is done by checking your blood sugar (glucose) after you have not eaten for a while (fasting). You may have this done every 1-3 years.  Bone density scan. This is done to screen for osteoporosis. You may have this done starting at age 75.  Mammogram. This may be done every 1-2 years. Talk to your health care provider about how often you should have regular mammograms. Talk with your health care provider about your test results, treatment options, and if necessary, the need for more tests. Vaccines  Your health care provider may recommend certain vaccines,  such as:  Influenza vaccine. This is recommended every year.  Tetanus, diphtheria, and acellular pertussis (Tdap, Td) vaccine. You may need a Td booster every 10  years.  Zoster vaccine. You may need this after age 81.  Pneumococcal 13-valent conjugate (PCV13) vaccine. One dose is recommended after age 22.  Pneumococcal polysaccharide (PPSV23) vaccine. One dose is recommended after age 25. Talk to your health care provider about which screenings and vaccines you need and how often you need them. This information is not intended to replace advice given to you by your health care provider. Make sure you discuss any questions you have with your health care provider. Document Released: 05/14/2015 Document Revised: 01/05/2016 Document Reviewed: 02/16/2015 Elsevier Interactive Patient Education  2017 [place] Prevention in the Home Falls can cause injuries. They can happen to people of all ages. There are many things you can do to make your home safe and to help prevent falls. What can I do on the outside of my home?  Regularly fix the edges of walkways and driveways and fix any cracks.  Remove anything that might make you trip as you walk through a door, such as a raised step or threshold.  Trim any bushes or trees on the path to your home.  Use bright outdoor lighting.  Clear any walking paths of anything that might make someone trip, such as rocks or tools.  Regularly check to see if handrails are loose or broken. Make sure that both sides of any steps have handrails.  Any raised decks and porches should have guardrails on the edges.  Have any leaves, snow, or ice cleared regularly.  Use sand or salt on walking paths during winter.  Clean up any spills in your garage right away. This includes oil or grease spills. What can I do in the bathroom?  Use night lights.  Install grab bars by the toilet and in the tub and shower. Do not use towel bars as grab bars.  Use non-skid mats or decals in the tub or shower.  If you need to sit down in the shower, use a plastic, non-slip stool.  Keep the floor dry. Clean up any water that  spills on the floor as soon as it happens.  Remove soap buildup in the tub or shower regularly.  Attach bath mats securely with double-sided non-slip rug tape.  Do not have throw rugs and other things on the floor that can make you trip. What can I do in the bedroom?  Use night lights.  Make sure that you have a light by your bed that is easy to reach.  Do not use any sheets or blankets that are too big for your bed. They should not hang down onto the floor.  Have a firm chair that has side arms. You can use this for support while you get dressed.  Do not have throw rugs and other things on the floor that can make you trip. What can I do in the kitchen?  Clean up any spills right away.  Avoid walking on wet floors.  Keep items that you use a lot in easy-to-reach places.  If you need to reach something above you, use a strong step stool that has a grab bar.  Keep electrical cords out of the way.  Do not use floor polish or wax that makes floors slippery. If you must use wax, use non-skid floor wax.  Do not have throw rugs and other things on  the floor that can make you trip. What can I do with my stairs?  Do not leave any items on the stairs.  Make sure that there are handrails on both sides of the stairs and use them. Fix handrails that are broken or loose. Make sure that handrails are as long as the stairways.  Check any carpeting to make sure that it is firmly attached to the stairs. Fix any carpet that is loose or worn.  Avoid having throw rugs at the top or bottom of the stairs. If you do have throw rugs, attach them to the floor with carpet tape.  Make sure that you have a light switch at the top of the stairs and the bottom of the stairs. If you do not have them, ask someone to add them for you. What else can I do to help prevent falls?  Wear shoes that:  Do not have high heels.  Have rubber bottoms.  Are comfortable and fit you well.  Are closed at the  toe. Do not wear sandals.  If you use a stepladder:  Make sure that it is fully opened. Do not climb a closed stepladder.  Make sure that both sides of the stepladder are locked into place.  Ask someone to hold it for you, if possible.  Clearly mark and make sure that you can see:  Any grab bars or handrails.  First and last steps.  Where the edge of each step is.  Use tools that help you move around (mobility aids) if they are needed. These include:  Canes.  Walkers.  Scooters.  Crutches.  Turn on the lights when you go into a dark area. Replace any light bulbs as soon as they burn out.  Set up your furniture so you have a clear path. Avoid moving your furniture around.  If any of your floors are uneven, fix them.  If there are any pets around you, be aware of where they are.  Review your medicines with your doctor. Some medicines can make you feel dizzy. This can increase your chance of falling. Ask your doctor what other things that you can do to help prevent falls. This information is not intended to replace advice given to you by your health care provider. Make sure you discuss any questions you have with your health care provider. Document Released: 02/11/2009 Document Revised: 09/23/2015 Document Reviewed: 05/22/2014 Elsevier Interactive Patient Education  2017 [employer].

## 2020-05-14 LAB — BMP8+EGFR
BUN/Creatinine Ratio: 17 (ref 12–28)
BUN: 15 mg/dL (ref 8–27)
CO2: 27 mmol/L (ref 20–29)
Calcium: 10 mg/dL (ref 8.7–10.3)
Chloride: 101 mmol/L (ref 96–106)
Creatinine, Ser: 0.88 mg/dL (ref 0.57–1.00)
GFR calc Af Amer: 77 mL/min/{1.73_m2} (ref >59)
GFR calc non Af Amer: 67 mL/min/{1.73_m2} (ref >59)
Glucose: 99 mg/dL (ref 65–99)
Potassium: 4 mmol/L (ref 3.5–5.2)
Sodium: 141 mmol/L (ref 134–144)

## 2020-05-14 LAB — HEMOGLOBIN A1C
Est. average glucose Bld gHb Est-mCnc: 123 mg/dL
Hgb A1c MFr Bld: 5.9 % — ABNORMAL HIGH (ref 4.8–5.6)

## 2020-05-14 LAB — INSULIN, RANDOM: INSULIN: 9.4 u[IU]/mL (ref 2.6–24.9)

## 2020-05-21 ENCOUNTER — Encounter: Payer: Self-pay | Admitting: Internal Medicine

## 2020-06-01 DIAGNOSIS — H00022 Hordeolum internum right lower eyelid: Secondary | ICD-10-CM | POA: Diagnosis not present

## 2020-06-22 DIAGNOSIS — M9901 Segmental and somatic dysfunction of cervical region: Secondary | ICD-10-CM | POA: Diagnosis not present

## 2020-06-22 DIAGNOSIS — M9903 Segmental and somatic dysfunction of lumbar region: Secondary | ICD-10-CM | POA: Diagnosis not present

## 2020-06-22 DIAGNOSIS — M9902 Segmental and somatic dysfunction of thoracic region: Secondary | ICD-10-CM | POA: Diagnosis not present

## 2020-06-22 DIAGNOSIS — M5442 Lumbago with sciatica, left side: Secondary | ICD-10-CM | POA: Diagnosis not present

## 2020-06-28 DIAGNOSIS — M9901 Segmental and somatic dysfunction of cervical region: Secondary | ICD-10-CM | POA: Diagnosis not present

## 2020-06-28 DIAGNOSIS — M9903 Segmental and somatic dysfunction of lumbar region: Secondary | ICD-10-CM | POA: Diagnosis not present

## 2020-06-28 DIAGNOSIS — M5442 Lumbago with sciatica, left side: Secondary | ICD-10-CM | POA: Diagnosis not present

## 2020-06-28 DIAGNOSIS — M9902 Segmental and somatic dysfunction of thoracic region: Secondary | ICD-10-CM | POA: Diagnosis not present

## 2020-07-04 ENCOUNTER — Other Ambulatory Visit: Payer: Self-pay | Admitting: Rheumatology

## 2020-07-05 DIAGNOSIS — G4733 Obstructive sleep apnea (adult) (pediatric): Secondary | ICD-10-CM | POA: Diagnosis not present

## 2020-07-05 NOTE — Telephone Encounter (Signed)
Last Visit: 05/12/2020 Next Visit: 11/17/2020 Labs: 02/04/2020, Uric acid is within the desirable range. CBC and CMP WNL, PCP will draw labs 07/08/2020, request sent to PCP through staff message  Current Dose per office note 05/12/2020, allopurinol 300 mg 1 tablet by mouth daily DX:  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 tophus   Last Fill: 03/29/2020  Okay to refill Allopurinol?

## 2020-07-08 ENCOUNTER — Encounter: Payer: Self-pay | Admitting: Internal Medicine

## 2020-07-08 ENCOUNTER — Other Ambulatory Visit: Payer: Self-pay

## 2020-07-08 ENCOUNTER — Other Ambulatory Visit: Payer: Self-pay | Admitting: Internal Medicine

## 2020-07-08 ENCOUNTER — Ambulatory Visit (INDEPENDENT_AMBULATORY_CARE_PROVIDER_SITE_OTHER): Payer: Medicare HMO | Admitting: Internal Medicine

## 2020-07-08 VITALS — BP 126/84 | HR 75 | Temp 98.2°F | Ht 64.6 in | Wt 245.8 lb

## 2020-07-08 DIAGNOSIS — I10 Essential (primary) hypertension: Secondary | ICD-10-CM

## 2020-07-08 DIAGNOSIS — R0600 Dyspnea, unspecified: Secondary | ICD-10-CM

## 2020-07-08 DIAGNOSIS — E7800 Pure hypercholesterolemia, unspecified: Secondary | ICD-10-CM

## 2020-07-08 DIAGNOSIS — R7303 Prediabetes: Secondary | ICD-10-CM

## 2020-07-08 DIAGNOSIS — M1A09X0 Idiopathic chronic gout, multiple sites, without tophus (tophi): Secondary | ICD-10-CM

## 2020-07-08 DIAGNOSIS — Z8249 Family history of ischemic heart disease and other diseases of the circulatory system: Secondary | ICD-10-CM | POA: Diagnosis not present

## 2020-07-08 DIAGNOSIS — R0609 Other forms of dyspnea: Secondary | ICD-10-CM

## 2020-07-08 NOTE — Progress Notes (Signed)
I,Meily Glowacki,acting as a Education administrator for Maximino Greenland, MD.,have documented all relevant documentation on the behalf of Maximino Greenland, MD,as directed by  Maximino Greenland, MD while in the presence of Maximino Greenland, MD.  This visit occurred during the SARS-CoV-2 public health emergency.  Safety protocols were in place, including screening questions prior to the visit, additional usage of staff PPE, and extensive cleaning of exam room while observing appropriate contact time as indicated for disinfecting solutions.  Subjective:     Patient ID: Robin Odom , female    DOB: Jan 17, 1951 , 70 y.o.   MRN: [id number]   Chief Complaint  Patient presents with  . Follow-up    Rybelsus  . Hypertension    HPI  She is here today for a bp check.  Hypertension This is a chronic problem. The current episode started more than 1 year ago. The problem has been gradually improving since onset. Risk factors for coronary artery disease include obesity and post-menopausal state. Past treatments include direct vasodilators, angiotensin blockers and diuretics.     Past Medical History:  Diagnosis Date  . Arthritis   . Diabetes mellitus without complication ([place])   . Gout   . Hypertension   . Partial retinal tear of both eyes without detachment 08/29/2017     Family History  Problem Relation Age of Onset  . Heart disease Mother   . Hypertension Mother   . Diabetes Mother   . Arthritis Father   . Hypertension Father   . Prostate cancer Father   . Parkinson's disease Father   . Heart disease Father   . Sinusitis Sister   . Allergic rhinitis Sister   . Diabetes Sister   . High Cholesterol Sister   . Hypertension Sister   . Diabetes Brother   . Hypertension Brother   . High Cholesterol Brother   . Hypertension Brother   . Angioedema Neg Hx   . Asthma Neg Hx   . Eczema Neg Hx   . Immunodeficiency Neg Hx   . Urticaria Neg Hx   . Breast cancer Neg Hx      Current Outpatient Medications:   .  allopurinol (ZYLOPRIM) 300 MG tablet, TAKE 1 TABLET BY MOUTH EVERY DAY, Disp: 90 tablet, Rfl: 0 .  amLODipine (NORVASC) 5 MG tablet, TAKE 1 TABLET BY MOUTH EVERY DAY, Disp: 90 tablet, Rfl: 2 .  Ascorbic Acid (VITAMIN C PO), Take 2,000 mg by mouth daily., Disp: , Rfl:  .  azelastine (ASTELIN) 0.1 % nasal spray, 1-2 sprays each nostril twice daily as needed, Disp: 30 mL, Rfl: 5 .  B Complex Vitamins (VITAMIN-B COMPLEX) TABS, Take by mouth., Disp: , Rfl:  .  BENFOTIAMINE PO, Take by mouth 2 (two) times daily., Disp: , Rfl:  .  Calcium Carbonate-Vit D-Min (CALCIUM 1200 PO), Take 600 mg by mouth daily. , Disp: , Rfl:  .  Cholecalciferol (VITAMIN D3) 3000 units TABS, Take by mouth. , Disp: , Rfl:  .  colchicine 0.6 MG tablet, Take 0.6 mg by mouth as needed., Disp: , Rfl:  .  fluticasone (FLOVENT HFA) 110 MCG/ACT inhaler, Inhale 2 puffs into the lungs 2 (two) times daily., Disp: 1 Inhaler, Rfl: 5 .  furosemide (LASIX) 20 MG tablet, TAKE 1 TABLET BY MOUTH EVERY DAY, Disp: 90 tablet, Rfl: 1 .  hydrALAZINE (APRESOLINE) 25 MG tablet, TAKE 1 TABLET BY MOUTH TWICE A DAY, Disp: 180 tablet, Rfl: 1 .  ipratropium (ATROVENT) 0.06 % nasal spray,  Place 2 sprays into both nostrils as needed for rhinitis., Disp: , Rfl:  .  magnesium oxide (MAG-OX) 400 MG tablet, Take 400 mg by mouth daily., Disp: , Rfl:  .  metoprolol succinate (TOPROL-XL) 50 MG 24 hr tablet, Take 1 tablet (50 mg total) by mouth daily. Take with or immediately following a meal., Disp: 90 tablet, Rfl: 1 .  Multiple Vitamin (MULTIVITAMIN+) LIQD, Take by mouth., Disp: , Rfl:  .  olmesartan (BENICAR) 40 MG tablet, TAKE 1 TABLET BY MOUTH EVERY DAY, Disp: 90 tablet, Rfl: 2 .  omega-3 acid ethyl esters (LOVAZA) 1 g capsule, Take 2 g by mouth 2 (two) times daily. Reported on 10/20/2015, Disp: , Rfl:  .  potassium chloride (MICRO-K) 10 MEQ CR capsule, TAKE 1 CAPSULE BY MOUTH EVERY DAY, Disp: 90 capsule, Rfl: 1 .  TURMERIC CURCUMIN PO, Take by mouth daily.,  Disp: , Rfl:  .  Zinc 50 MG TABS, Take 1 tablet by mouth daily., Disp: , Rfl:    Allergies  Allergen Reactions  . Codeine Other (See Comments)    Hallucinations  . Penicillins Hives     Review of Systems   Today's Vitals   07/08/20 1435  BP: 126/84  Pulse: 75  Temp: 98.2 F (36.8 C)  TempSrc: Oral  Weight: 245 lb 12.8 oz (111.5 kg)  Height: 5' 4.6" (1.641 m)  PainSc: 7   PainLoc: Generalized   Body mass index is 41.41 kg/m.   Objective:  Physical Exam      Assessment And Plan:     1. Prediabetes     Patient was given opportunity to ask questions. Patient verbalized understanding of the plan and was able to repeat key elements of the plan. All questions were answered to their satisfaction.  Michelle Nasuti, [place], Michelle Nasuti, CMA, have reviewed all documentation for this visit. The documentation on 07/08/20 for the exam, diagnosis, procedures, and orders are all accurate and complete.  THE PATIENT IS ENCOURAGED TO PRACTICE SOCIAL DISTANCING DUE TO THE COVID-19 PANDEMIC.

## 2020-07-08 NOTE — Progress Notes (Signed)
Rutherford Nail as a scribe for Maximino Greenland, MD.,have documented all relevant documentation on the behalf of Maximino Greenland, MD,as directed by  Maximino Greenland, MD while in the presence of Maximino Greenland, MD. This visit occurred during the SARS-CoV-2 public health emergency.  Safety protocols were in place, including screening questions prior to the visit, additional usage of staff PPE, and extensive cleaning of exam room while observing appropriate contact time as indicated for disinfecting solutions.  Subjective:     Patient ID: Robin Odom , female    DOB: 09-08-1950 , 70 y.o.   MRN: [id number]   Chief Complaint  Patient presents with  . Follow-up    Rybelsus  . Hypertension    HPI  Pt is here today to f/u on her medication Rybelsus.  She has taken medication without any issues.   Hypertension This is a chronic problem. The current episode started more than 1 year ago. The problem has been gradually improving since onset. Associated symptoms include shortness of breath. Pertinent negatives include no headaches. Risk factors for coronary artery disease include obesity and post-menopausal state. Past treatments include direct vasodilators, angiotensin blockers and diuretics.     Past Medical History:  Diagnosis Date  . Arthritis   . Diabetes mellitus without complication ([place])   . Gout   . Hypertension   . Partial retinal tear of both eyes without detachment 08/29/2017     Family History  Problem Relation Age of Onset  . Heart disease Mother   . Hypertension Mother   . Diabetes Mother   . Arthritis Father   . Hypertension Father   . Prostate cancer Father   . Parkinson's disease Father   . Heart disease Father   . Sinusitis Sister   . Allergic rhinitis Sister   . Diabetes Sister   . High Cholesterol Sister   . Hypertension Sister   . Diabetes Brother   . Hypertension Brother   . High Cholesterol Brother   . Hypertension Brother   . Angioedema Neg Hx    . Asthma Neg Hx   . Eczema Neg Hx   . Immunodeficiency Neg Hx   . Urticaria Neg Hx   . Breast cancer Neg Hx      Current Outpatient Medications:  .  allopurinol (ZYLOPRIM) 300 MG tablet, TAKE 1 TABLET BY MOUTH EVERY DAY, Disp: 90 tablet, Rfl: 0 .  amLODipine (NORVASC) 5 MG tablet, TAKE 1 TABLET BY MOUTH EVERY DAY, Disp: 90 tablet, Rfl: 2 .  Ascorbic Acid (VITAMIN C PO), Take 2,000 mg by mouth daily., Disp: , Rfl:  .  azelastine (ASTELIN) 0.1 % nasal spray, 1-2 sprays each nostril twice daily as needed, Disp: 30 mL, Rfl: 5 .  B Complex Vitamins (VITAMIN-B COMPLEX) TABS, Take by mouth., Disp: , Rfl:  .  BENFOTIAMINE PO, Take by mouth 2 (two) times daily., Disp: , Rfl:  .  Calcium Carbonate-Vit D-Min (CALCIUM 1200 PO), Take 600 mg by mouth daily. , Disp: , Rfl:  .  Cholecalciferol (VITAMIN D3) 3000 units TABS, Take by mouth. , Disp: , Rfl:  .  colchicine 0.6 MG tablet, Take 0.6 mg by mouth as needed., Disp: , Rfl:  .  fluticasone (FLOVENT HFA) 110 MCG/ACT inhaler, Inhale 2 puffs into the lungs 2 (two) times daily., Disp: 1 Inhaler, Rfl: 5 .  furosemide (LASIX) 20 MG tablet, TAKE 1 TABLET BY MOUTH EVERY DAY, Disp: 90 tablet, Rfl: 1 .  hydrALAZINE (APRESOLINE) 25 MG  tablet, TAKE 1 TABLET BY MOUTH TWICE A DAY, Disp: 180 tablet, Rfl: 1 .  ipratropium (ATROVENT) 0.06 % nasal spray, Place 2 sprays into both nostrils as needed for rhinitis., Disp: , Rfl:  .  magnesium oxide (MAG-OX) 400 MG tablet, Take 400 mg by mouth daily., Disp: , Rfl:  .  metoprolol succinate (TOPROL-XL) 50 MG 24 hr tablet, Take 1 tablet (50 mg total) by mouth daily. Take with or immediately following a meal., Disp: 90 tablet, Rfl: 1 .  Multiple Vitamin (MULTIVITAMIN+) LIQD, Take by mouth., Disp: , Rfl:  .  olmesartan (BENICAR) 40 MG tablet, TAKE 1 TABLET BY MOUTH EVERY DAY, Disp: 90 tablet, Rfl: 2 .  omega-3 acid ethyl esters (LOVAZA) 1 g capsule, Take 2 g by mouth 2 (two) times daily. Reported on 10/20/2015, Disp: , Rfl:  .   potassium chloride (MICRO-K) 10 MEQ CR capsule, TAKE 1 CAPSULE BY MOUTH EVERY DAY, Disp: 90 capsule, Rfl: 1 .  TURMERIC CURCUMIN PO, Take by mouth daily., Disp: , Rfl:  .  Zinc 50 MG TABS, Take 1 tablet by mouth daily., Disp: , Rfl:    Allergies  Allergen Reactions  . Codeine Other (See Comments)    Hallucinations  . Penicillins Hives     Review of Systems  Constitutional: Negative.  Negative for fatigue.  HENT: Negative.   Respiratory: Positive for shortness of breath.        She admits to worsening sob with exertion. She admits her sx started last summer. She states she blew sx off because she acknowledges she has had some weight gain. No associated chest pain, palpitations or diaphoresis. She is concerned b/c she has family history of heart disease.   Cardiovascular: Negative.   Endocrine: Negative for polydipsia, polyphagia and polyuria.  Musculoskeletal: Negative.   Skin: Negative.   Neurological: Negative for dizziness and headaches.  Psychiatric/Behavioral: Negative.      Today's Vitals   07/08/20 1435  BP: 126/84  Pulse: 75  Temp: 98.2 F (36.8 C)  TempSrc: Oral  Weight: 245 lb 12.8 oz (111.5 kg)  Height: 5' 4.6" (1.641 m)  PainSc: 7   PainLoc: Generalized   Body mass index is 41.41 kg/m.   Wt Readings from Last 3 Encounters:  07/08/20 245 lb 12.8 oz (111.5 kg)  05/13/20 247 lb (112 kg)  05/13/20 247 lb (112 kg)    Objective:  Physical Exam Vitals reviewed.  Constitutional:      General: She is not in acute distress.    Appearance: Normal appearance. She is obese.  HENT:     Head: Normocephalic.     Nose: No congestion.     Comments: Masked     Mouth/Throat:     Comments: Masked  Eyes:     Extraocular Movements: Extraocular movements intact.     Conjunctiva/sclera: Conjunctivae normal.     Pupils: Pupils are equal, round, and reactive to light.  Cardiovascular:     Rate and Rhythm: Normal rate and regular rhythm.     Pulses: Normal pulses.      Heart sounds: Normal heart sounds. No murmur heard.   Pulmonary:     Effort: Pulmonary effort is normal. No respiratory distress.     Breath sounds: Normal breath sounds. No wheezing.  Skin:    General: Skin is warm and dry.     Capillary Refill: Capillary refill takes less than 2 seconds.  Neurological:     General: No focal deficit present.  Mental Status: She is alert and oriented to person, place, and time.     Cranial Nerves: No cranial nerve deficit.  Psychiatric:        Mood and Affect: Mood normal.        Behavior: Behavior normal.        Thought Content: Thought content normal.        Judgment: Judgment normal.         Assessment And Plan:     1. Prediabetes Comments: I will increase dose of Rybelsus to 35m - 2 tabs per day. If tolerated, I will send rx [id number] the pharmacy. She agrees to f/u in 8-10 weeks.   2. Essential hypertension, benign Comments: Chronic, well controlled today. Home BP readings are elevated in 130-150s/80-90s. She does not wish to increase amlodipine until Cardiology evaluation.   3. Dyspnea on exertion Comments: She agrees to Cardiology referral and 2d echocardiogram. Will request previous echo performed at [facility]. I will check CBC to r/o anemia as well.  - CBC with Diff - Ambulatory referral to Cardiology - ECHOCARDIOGRAM COMPLETE; Future  4. Pure hypercholesterolemia Comments: I will check non-fasting lipid panel today. Encouraged to avoid fried foods, c/w regular exercise and increase her fiber intake.  - Lipid panel  5. 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 tophus Comments: I will check labs as listed below and forward them to Rheumatology, Dr. DeEstanislado PandyShe is encouraged to follow a low sodium diet.  - CBC with Diff - CMP14+EGFR - Uric acid  6. Family history of heart disease - Ambulatory referral to Cardiology   Patient was given opportunity to ask questions. Patient verbalized understanding of the plan and was able  to repeat key elements of the plan. All questions were answered to their satisfaction.   I, RoMaximino GreenlandMD, have reviewed all documentation for this visit. The documentation on 07/08/20 for the exam, diagnosis, procedures, and orders are all accurate and complete.  THE PATIENT IS ENCOURAGED TO PRACTICE SOCIAL DISTANCING DUE TO THE COVID-19 PANDEMIC.

## 2020-07-09 LAB — CBC WITH DIFFERENTIAL/PLATELET
Basophils Absolute: 0.1 10*3/uL (ref 0.0–0.2)
Basos: 1 %
EOS (ABSOLUTE): 0.2 10*3/uL (ref 0.0–0.4)
Eos: 2 %
Hematocrit: 40.8 % (ref 34.0–46.6)
Hemoglobin: 13 g/dL (ref 11.1–15.9)
Immature Grans (Abs): 0.1 10*3/uL (ref 0.0–0.1)
Immature Granulocytes: 1 %
Lymphocytes Absolute: 2.9 10*3/uL (ref 0.7–3.1)
Lymphs: 28 %
MCH: 25.5 pg — ABNORMAL LOW (ref 26.6–33.0)
MCHC: 31.9 g/dL (ref 31.5–35.7)
MCV: 80 fL (ref 79–97)
Monocytes Absolute: 0.8 10*3/uL (ref 0.1–0.9)
Monocytes: 8 %
Neutrophils Absolute: 6.4 10*3/uL (ref 1.4–7.0)
Neutrophils: 60 %
Platelets: 340 10*3/uL (ref 150–450)
RBC: 5.1 x10E6/uL (ref 3.77–5.28)
RDW: 15.2 % (ref 11.7–15.4)
WBC: 10.5 10*3/uL (ref 3.4–10.8)

## 2020-07-09 LAB — CMP14+EGFR
ALT: 16 IU/L (ref 0–32)
AST: 21 IU/L (ref 0–40)
Albumin/Globulin Ratio: 1.7 (ref 1.2–2.2)
Albumin: 4.4 g/dL (ref 3.8–4.8)
Alkaline Phosphatase: 71 IU/L (ref 44–121)
BUN/Creatinine Ratio: 17 (ref 12–28)
BUN: 15 mg/dL (ref 8–27)
Bilirubin Total: 0.3 mg/dL (ref 0.0–1.2)
CO2: 24 mmol/L (ref 20–29)
Calcium: 9.8 mg/dL (ref 8.7–10.3)
Chloride: 103 mmol/L (ref 96–106)
Creatinine, Ser: 0.86 mg/dL (ref 0.57–1.00)
Globulin, Total: 2.6 g/dL (ref 1.5–4.5)
Glucose: 86 mg/dL (ref 65–99)
Potassium: 4 mmol/L (ref 3.5–5.2)
Sodium: 142 mmol/L (ref 134–144)
Total Protein: 7 g/dL (ref 6.0–8.5)
eGFR: 73 mL/min/{1.73_m2} (ref >59)

## 2020-07-09 LAB — LIPID PANEL
Chol/HDL Ratio: 3.5 ratio (ref 0.0–4.4)
Cholesterol, Total: 188 mg/dL (ref 100–199)
HDL: 54 mg/dL (ref >39)
LDL Chol Calc (NIH): 122 mg/dL — ABNORMAL HIGH (ref 0–99)
Triglycerides: 63 mg/dL (ref 0–149)
VLDL Cholesterol Cal: 12 mg/dL (ref 5–40)

## 2020-07-09 LAB — URIC ACID: Uric Acid: 4.1 mg/dL (ref 3.0–7.2)

## 2020-07-09 NOTE — Progress Notes (Signed)
CBC and CMP normal.  Uric acid is in desirable range.

## 2020-07-10 ENCOUNTER — Other Ambulatory Visit: Payer: Self-pay | Admitting: Internal Medicine

## 2020-07-10 DIAGNOSIS — I10 Essential (primary) hypertension: Secondary | ICD-10-CM

## 2020-07-12 ENCOUNTER — Telehealth: Payer: Self-pay

## 2020-07-12 DIAGNOSIS — H35033 Hypertensive retinopathy, bilateral: Secondary | ICD-10-CM | POA: Diagnosis not present

## 2020-07-12 DIAGNOSIS — H43813 Vitreous degeneration, bilateral: Secondary | ICD-10-CM | POA: Diagnosis not present

## 2020-07-12 DIAGNOSIS — H2513 Age-related nuclear cataract, bilateral: Secondary | ICD-10-CM | POA: Diagnosis not present

## 2020-07-12 DIAGNOSIS — H34832 Tributary (branch) retinal vein occlusion, left eye, with macular edema: Secondary | ICD-10-CM | POA: Diagnosis not present

## 2020-07-12 NOTE — Telephone Encounter (Signed)
The pt was told that she can stop by the office when she is in the area as long as the office is open to do her lab work.

## 2020-07-13 ENCOUNTER — Other Ambulatory Visit: Payer: Self-pay

## 2020-07-13 ENCOUNTER — Other Ambulatory Visit: Payer: Medicare HMO

## 2020-07-13 DIAGNOSIS — R0600 Dyspnea, unspecified: Secondary | ICD-10-CM

## 2020-07-13 DIAGNOSIS — R0609 Other forms of dyspnea: Secondary | ICD-10-CM

## 2020-07-14 ENCOUNTER — Encounter: Payer: Self-pay | Admitting: Internal Medicine

## 2020-07-14 LAB — BRAIN NATRIURETIC PEPTIDE: BNP: 21.9 pg/mL (ref 0.0–100.0)

## 2020-07-26 DIAGNOSIS — M9902 Segmental and somatic dysfunction of thoracic region: Secondary | ICD-10-CM | POA: Diagnosis not present

## 2020-07-26 DIAGNOSIS — M9903 Segmental and somatic dysfunction of lumbar region: Secondary | ICD-10-CM | POA: Diagnosis not present

## 2020-07-26 DIAGNOSIS — M5442 Lumbago with sciatica, left side: Secondary | ICD-10-CM | POA: Diagnosis not present

## 2020-07-26 DIAGNOSIS — M9901 Segmental and somatic dysfunction of cervical region: Secondary | ICD-10-CM | POA: Diagnosis not present

## 2020-08-02 ENCOUNTER — Ambulatory Visit: Payer: Medicare HMO | Admitting: Cardiology

## 2020-08-02 ENCOUNTER — Encounter: Payer: Self-pay | Admitting: Cardiology

## 2020-08-02 ENCOUNTER — Other Ambulatory Visit: Payer: Self-pay

## 2020-08-02 VITALS — BP 140/98 | HR 74 | Ht 65.0 in | Wt 242.0 lb

## 2020-08-02 DIAGNOSIS — R0600 Dyspnea, unspecified: Secondary | ICD-10-CM

## 2020-08-02 DIAGNOSIS — R0609 Other forms of dyspnea: Secondary | ICD-10-CM

## 2020-08-02 DIAGNOSIS — I10 Essential (primary) hypertension: Secondary | ICD-10-CM | POA: Diagnosis not present

## 2020-08-02 DIAGNOSIS — Z8249 Family history of ischemic heart disease and other diseases of the circulatory system: Secondary | ICD-10-CM | POA: Diagnosis not present

## 2020-08-02 DIAGNOSIS — Z9189 Other specified personal risk factors, not elsewhere classified: Secondary | ICD-10-CM | POA: Diagnosis not present

## 2020-08-02 DIAGNOSIS — Z7189 Other specified counseling: Secondary | ICD-10-CM | POA: Diagnosis not present

## 2020-08-02 DIAGNOSIS — Z6841 Body Mass Index (BMI) 40.0 and over, adult: Secondary | ICD-10-CM

## 2020-08-02 NOTE — Patient Instructions (Addendum)
Medication Instructions:  Your Physician recommend you continue on your current medication as directed.    *If you need a refill on your cardiac medications before your next appointment, please call your pharmacy*   Lab Work: None   Testing/Procedures: Your physician has requested that you have an exercise tolerance test. For further information please visit [email or web address]. Please also follow instruction sheet, as given. [place]  You will need to have the coronavirus test completed prior to your procedure. This is a Drive Up Visit at [address], [place], [place] [address]. Please tell them that you are there for procedure testing. Stay in your car and someone will be with you shortly. Please make sure to have all other labs completed before this test because you will need to stay quarantined until your procedure.    Follow-Up: At [facility], you and your health needs are our priority.  As part of our continuing mission to provide you with exceptional heart care, we have created designated Provider Care Teams.  These Care Teams include your primary Cardiologist (physician) and Advanced Practice Providers (APPs -  Physician Assistants and Nurse Practitioners) who all work together to provide you with the care you need, when you need it.  We recommend signing up for the patient portal called "MyChart".  Sign up information is provided on this After Visit Summary.  MyChart is used to connect with patients for Virtual Visits (Telemedicine).  Patients are able to view lab/test results, encounter notes, upcoming appointments, etc.  Non-urgent messages can be sent to your provider as well.   To learn more about what you can do with MyChart, go to [email or web address].    Your next appointment:   6 week(s)  The format for your next appointment:   In Person  Provider:   Buford Dresser, MD    [facility] [address], [place], [place] [address] Phone:  [phone]        You are scheduled for an Exercise Stress Test  Please arrive 15 minutes prior to your appointment time for registration and insurance purposes.  The test will take approximately 45 minutes to complete.  How to prepare for your Exercise Stress Test: Do bring a list of your current medications with you.  If not listed below, you may take your medications as normal. . Do not take metoprolol (Lopressor, Toprol) for 24 hours prior to the test.  Bring the medication to your appointment as you may be required to take it once the test is complete. . Do wear comfortable clothes (no dresses or overalls) and walking shoes, tennis shoes preferred (no heels or open toed shoes are allowed) . Do Not wear cologne, perfume, aftershave or lotions (deodorant is allowed). . Please report to [place], [address] for your test.  If these instructions are not followed, your test will have to be rescheduled.  If you have questions or concerns about your appointment, you can call the Stress Lab at [phone].  If you cannot keep your appointment, please provide 24 hours notification to the Stress Lab, to avoid a possible $50 charge to your account

## 2020-08-02 NOTE — Progress Notes (Deleted)
Cardiology Office Note:    Date:  08/02/2020   ID:  Robin Odom, DOB 05/19/1950, MRN [id number]  PCP:  Glendale Chard, MD  Cardiologist:  No primary care provider on file.  Referring MD: Glendale Chard, MD   No chief complaint on file.   History of Present Illness:    Robin Odom is a 69 y.o. female with a hx of hypertension, asthma who is seen as a new consult at the request of Glendale Chard, MD for the evaluation and management of dyspnea on exertion.  Note from Dr. Baird Cancer dated 07/08/20 reviewed. Noted that she has had shortness of breath on exertion since summer 2021 but initially ignored symptoms. Has family history of heart disease, no other associated symptoms with shortness of breath.  Cardiovascular risk factors: Prior clinical ASCVD:  Comorbid conditions, including hypertension, hyperlipidemia, diabetes, chronic kidney disease:  Metabolic syndrome/Obesity: Chronic inflammatory conditions: Tobacco use history: Family history: Prior cardiac testing and/or incidental findings on other testing (ie coronary calcium): Exercise level: Current diet:  Past Medical History:  Diagnosis Date  . Arthritis   . Diabetes mellitus without complication ([place])   . Gout   . Hypertension   . Partial retinal tear of both eyes without detachment 08/29/2017    Past Surgical History:  Procedure Laterality Date  . ABDOMINAL HYSTERECTOMY  1991  . CARPAL TUNNEL RELEASE Right 2019  . COLONOSCOPY     5 between 1994-2010  . Left knee surgery  2006  . ROTATOR CUFF REPAIR Left 2001  . ROTATOR CUFF REPAIR Right 2003  . SHOULDER SURGERY  2001/2003  . TONSILECTOMY/ADENOIDECTOMY WITH MYRINGOTOMY  1971    Current Medications: Current Outpatient Medications on File Prior to Visit  Medication Sig  . allopurinol (ZYLOPRIM) 300 MG tablet TAKE 1 TABLET BY MOUTH EVERY DAY  . amLODipine (NORVASC) 5 MG tablet TAKE 1 TABLET BY MOUTH EVERY DAY  . Ascorbic Acid (VITAMIN C PO) Take 2,000 mg by mouth  daily.  Marland Kitchen azelastine (ASTELIN) 0.1 % nasal spray 1-2 sprays each nostril twice daily as needed  . B Complex Vitamins (VITAMIN-B COMPLEX) TABS Take by mouth.  . BENFOTIAMINE PO Take by mouth 2 (two) times daily.  . Calcium Carbonate-Vit D-Min (CALCIUM 1200 PO) Take 600 mg by mouth daily.   . Cholecalciferol (VITAMIN D3) 3000 units TABS Take by mouth.   . colchicine 0.6 MG tablet Take 0.6 mg by mouth as needed.  . fluticasone (FLOVENT HFA) 110 MCG/ACT inhaler Inhale 2 puffs into the lungs 2 (two) times daily.  . furosemide (LASIX) 20 MG tablet TAKE 1 TABLET BY MOUTH EVERY DAY  . hydrALAZINE (APRESOLINE) 25 MG tablet TAKE 1 TABLET BY MOUTH TWICE A DAY  . ipratropium (ATROVENT) 0.06 % nasal spray Place 2 sprays into both nostrils as needed for rhinitis.  . magnesium oxide (MAG-OX) 400 MG tablet Take 400 mg by mouth daily.  . metoprolol succinate (TOPROL-XL) 50 MG 24 hr tablet TAKE 1 TABLET (50 MG TOTAL) BY MOUTH DAILY. TAKE WITH OR IMMEDIATELY FOLLOWING A MEAL.  . Multiple Vitamin (MULTIVITAMIN+) LIQD Take by mouth.  . olmesartan (BENICAR) 40 MG tablet TAKE 1 TABLET BY MOUTH EVERY DAY  . omega-3 acid ethyl esters (LOVAZA) 1 g capsule Take 2 g by mouth 2 (two) times daily. Reported on 10/20/2015  . potassium chloride (MICRO-K) 10 MEQ CR capsule TAKE 1 CAPSULE BY MOUTH EVERY DAY  . TURMERIC CURCUMIN PO Take by mouth daily.  . Zinc 50 MG TABS Take 1 tablet  by mouth daily.   No current facility-administered medications on file prior to visit.     Allergies:   Codeine and Penicillins   Social History   Tobacco Use  . Smoking status: Never Smoker  . Smokeless tobacco: Never Used  Vaping Use  . Vaping Use: Never used  Substance Use Topics  . Alcohol use: Yes    Comment: rarely  . Drug use: No    Family History: family history includes Allergic rhinitis in her sister; Arthritis in her father; Diabetes in her brother, mother, and sister; Heart disease in her father and mother; High  Cholesterol in her brother and sister; Hypertension in her brother, brother, father, mother, and sister; Parkinson's disease in her father; Prostate cancer in her father; Sinusitis in her sister. There is no history of Angioedema, Asthma, Eczema, Immunodeficiency, Urticaria, or Breast cancer.  ROS:   Please see the history of present illness.  Additional pertinent ROS: Constitutional: Negative for chills, fever, night sweats, unintentional weight loss  HENT: Negative for ear pain and hearing loss.   Eyes: Negative for loss of vision and eye pain.  Respiratory: Negative for cough, sputum, wheezing.   Cardiovascular: See HPI. Gastrointestinal: Negative for abdominal pain, melena, and hematochezia.  Genitourinary: Negative for dysuria and hematuria.  Musculoskeletal: Negative for falls and myalgias.  Skin: Negative for itching and rash.  Neurological: Negative for focal weakness, focal sensory changes and loss of consciousness.  Endo/Heme/Allergies: Does not bruise/bleed easily.     EKGs/Labs/Other Studies Reviewed:    The following studies were reviewed today: Echocardiogram scheduled for 08/27/20  EKG:  EKG is personally reviewed.  The ekg ordered today demonstrates ***  Recent Labs: 07/08/2020: ALT 16; BUN 15; Creatinine, Ser 0.86; Hemoglobin 13.0; Platelets 340; Potassium 4.0; Sodium 142 07/13/2020: BNP 21.9  Recent Lipid Panel    Component Value Date/Time   CHOL 188 07/08/2020 1528   TRIG 63 07/08/2020 1528   HDL 54 07/08/2020 1528   CHOLHDL 3.5 07/08/2020 1528   LDLCALC 122 (H) 07/08/2020 1528    Physical Exam:    VS:  There were no vitals taken for this visit.    Wt Readings from Last 3 Encounters:  07/08/20 245 lb 12.8 oz (111.5 kg)  05/13/20 247 lb (112 kg)  05/13/20 247 lb (112 kg)    GEN: Well nourished, well developed in no acute distress HEENT: Normal, moist mucous membranes NECK: No JVD CARDIAC: regular rhythm, normal S1 and S2, no rubs or gallops. No  murmur. VASCULAR: Radial and DP pulses 2+ bilaterally. No carotid bruits RESPIRATORY:  Clear to auscultation without rales, wheezing or rhonchi  ABDOMEN: Soft, non-tender, non-distended MUSCULOSKELETAL:  Ambulates independently SKIN: Warm and dry, no edema NEUROLOGIC:  Alert and oriented x 3. No focal neuro deficits noted. PSYCHIATRIC:  Normal affect    ASSESSMENT:    No diagnosis found. PLAN:     Cardiac risk counseling and prevention recommendations: -recommend heart healthy/Mediterranean diet, with whole grains, fruits, vegetable, fish, lean meats, nuts, and olive oil. Limit salt. -recommend moderate walking, 3-5 times/week for 30-50 minutes each session. Aim for at least 150 minutes.week. Goal should be pace of 3 miles/hours, or walking 1.5 miles in 30 minutes -recommend avoidance of tobacco products. Avoid excess alcohol. -Additional risk factor control:  -Diabetes risk: A1c is  -Lipids:   -Blood pressure control:  -Weight:  -ASCVD risk score: The 10-year ASCVD risk score Mikey Bussing DC Jr., et al., 2013) is: 24.3%   Values used to calculate the  score:     Age: 62 years     Sex: Female     Is Non-Hispanic African American: Yes     Diabetic: Yes     Tobacco smoker: No     Systolic Blood Pressure: 094 mmHg     Is BP treated: Yes     HDL Cholesterol: 54 mg/dL     Total Cholesterol: 188 mg/dL    Plan for follow up:  Buford Dresser, MD, [name], [facility]    Medication Adjustments/Labs and Tests Ordered: Current medicines are reviewed at length with the patient today.  Concerns regarding medicines are outlined above.  No orders of the defined types were placed in this encounter.  No orders of the defined types were placed in this encounter.   There are no Patient Instructions on file for this visit.  Signed, Buford Dresser, MD [name] 08/02/2020 1:00 PM    [place]

## 2020-08-02 NOTE — Progress Notes (Signed)
Cardiology Office Note:    Date:  08/02/2020   ID:  Robin Odom, DOB January 01, 1951, MRN [id number]  PCP:  Glendale Chard, MD  Cardiologist:  Buford Dresser, MD  Referring MD: Glendale Chard, MD   CC: new patient evaluation for dyspnea on exertion  History of Present Illness:    Robin Odom is a 70 y.o. female with a hx of hypertension, asthma who is seen as a new consult at the request of Glendale Chard, MD for the evaluation and management of dyspnea on exertion.  Note from Dr. Baird Cancer dated 07/08/20 reviewed. Noted that she has had shortness of breath on exertion since summer 2021 but initially ignored symptoms. Has family history of heart disease, no other associated symptoms with shortness of breath.  Cardiovascular risk factors: No MI or stroke Prior clinical ASCVD: Never Comorbid conditions, including hypertension, hyperlipidemia, diabetes, chronic kidney disease: Endorses hypertension and hyperlipidemia. Denies DM or kidney disease Metabolic syndrome/Obesity: Her highest weight was 285 lbs. She reports having a 25 lbs weight gain last year, she lost the weight, and then gained it back. Now her weights have been consistent.  Chronic inflammatory conditions: none (osteoarthritis, not rheumatoid) Tobacco use history: Never Family history: 3/4 of her siblings has HTN. 2/4 of her siblings has DM. Mother had Type II DM(most of maternal siblings had DM). Mother had unknown CAD and underwent a bypass surgery three years prior to death of a MI in 25. Most of her maternal sibling died from MI. Father had Type II  DM(Paternal sibling no DM). Unaware of any strokes on her fathers side, mostly cancer.   Prior cardiac testing and/or incidental findings on other testing (ie coronary calcium): none available for review Exercise level: She exercises 4-5 days per week for 40-50 minutes. She has some exertional SOB that has gradually worsened. However this only occurs when she is walking on harder  surfaces. For example, when she gets out of the pool, when she walks to her mailbox, and when she walks in the grocery store. Current diet: She eats [place] and [place] food with spices. She does not little sweets. She eats mostly salty foods but she is not a big sweet eater. She reports over indulging herself with butter, bacon, cheese during COVID daily. However she is quitting these foods and additives due spike in her cholesterol. She drinks 3.25 oz of coffee and sometimes a 1/2 cup daily to cut back.   She is being managed by ENT due to her long history of sinus pressure and infections. She reports that when she exercises she breaths through her nose only. She relates this to her nose and sinus issues. She has been using a CPAP machine since 1994 to manage her breath at night. She now complains of having mouth dryness and fatigue in the morning. She has a chronic history of LE edema(R>L), but she wears compression socks to manage this. She had two episodes of palpitation during the last year. However drank a glass of water and it helped resolve the symptoms. She denies any fever, chills, productive cough, blood in stool, hematuria, headaches, nausea, vomiting    Past Medical History:  Diagnosis Date  . Arthritis   . Diabetes mellitus without complication ([place])   . Gout   . Hypertension   . Partial retinal tear of both eyes without detachment 08/29/2017    Past Surgical History:  Procedure Laterality Date  . ABDOMINAL HYSTERECTOMY  1991  . CARPAL TUNNEL RELEASE Right 2019  .  COLONOSCOPY     5 between 1994-2010  . Left knee surgery  2006  . ROTATOR CUFF REPAIR Left 2001  . ROTATOR CUFF REPAIR Right 2003  . SHOULDER SURGERY  2001/2003  . TONSILECTOMY/ADENOIDECTOMY WITH MYRINGOTOMY  1971    Current Medications: Current Outpatient Medications on File Prior to Visit  Medication Sig  . allopurinol (ZYLOPRIM) 300 MG tablet TAKE 1 TABLET BY MOUTH EVERY DAY  . amLODipine (NORVASC)  5 MG tablet TAKE 1 TABLET BY MOUTH EVERY DAY  . Ascorbic Acid (VITAMIN C PO) Take 2,000 mg by mouth daily.  Marland Kitchen azelastine (ASTELIN) 0.1 % nasal spray 1-2 sprays each nostril twice daily as needed  . B Complex Vitamins (VITAMIN-B COMPLEX) TABS Take by mouth.  . BENFOTIAMINE PO Take by mouth 2 (two) times daily.  . Calcium Carbonate-Vit D-Min (CALCIUM 1200 PO) Take 600 mg by mouth daily.   . Cholecalciferol (VITAMIN D3) 3000 units TABS Take by mouth.   . colchicine 0.6 MG tablet Take 0.6 mg by mouth as needed.  . fluticasone (FLOVENT HFA) 110 MCG/ACT inhaler Inhale 2 puffs into the lungs 2 (two) times daily.  . furosemide (LASIX) 20 MG tablet TAKE 1 TABLET BY MOUTH EVERY DAY  . hydrALAZINE (APRESOLINE) 25 MG tablet TAKE 1 TABLET BY MOUTH TWICE A DAY  . ipratropium (ATROVENT) 0.06 % nasal spray Place 2 sprays into both nostrils as needed for rhinitis.  . magnesium oxide (MAG-OX) 400 MG tablet Take 400 mg by mouth daily.  . metoprolol succinate (TOPROL-XL) 50 MG 24 hr tablet TAKE 1 TABLET (50 MG TOTAL) BY MOUTH DAILY. TAKE WITH OR IMMEDIATELY FOLLOWING A MEAL.  . metoprolol tartrate (LOPRESSOR) 50 MG tablet Take by mouth.  . Multiple Vitamin (MULTIVITAMIN+) LIQD Take by mouth.  . olmesartan (BENICAR) 40 MG tablet TAKE 1 TABLET BY MOUTH EVERY DAY  . omega-3 acid ethyl esters (LOVAZA) 1 g capsule Take 2 g by mouth 2 (two) times daily. Reported on 10/20/2015  . potassium chloride (MICRO-K) 10 MEQ CR capsule TAKE 1 CAPSULE BY MOUTH EVERY DAY  . Semaglutide (RYBELSUS) 3 MG TABS   . TURMERIC CURCUMIN PO Take by mouth daily.  . Zinc 50 MG TABS Take 1 tablet by mouth daily.   No current facility-administered medications on file prior to visit.     Allergies:   Codeine and Penicillins   Social History   Tobacco Use  . Smoking status: Never Smoker  . Smokeless tobacco: Never Used  Vaping Use  . Vaping Use: Never used  Substance Use Topics  . Alcohol use: Yes    Comment: rarely  . Drug use: No     Family History: family history includes Allergic rhinitis in her sister; Arthritis in her father; Diabetes in her brother, mother, and sister; Heart disease in her father and mother; High Cholesterol in her brother and sister; Hypertension in her brother, brother, father, mother, and sister; Parkinson's disease in her father; Prostate cancer in her father; Sinusitis in her sister. There is no history of Angioedema, Asthma, Eczema, Immunodeficiency, Urticaria, or Breast cancer.  ROS:   Please see the history of present illness.  Additional pertinent ROS: Constitutional: Negative for chills, fever, night sweats, unintentional weight loss  HENT: Negative for ear pain and hearing loss.   Eyes: Negative for loss of vision and eye pain.  Respiratory: Negative for cough, sputum, wheezing. Positive for shortness of breath Cardiovascular: See HPI. Gastrointestinal: Negative for abdominal pain, melena, and hematochezia.  Genitourinary: Negative for  dysuria and hematuria.  Musculoskeletal: Negative for falls and myalgias.  Skin: Negative for itching and rash.  Neurological: Negative for focal weakness, focal sensory changes and loss of consciousness.  Endo/Heme/Allergies: Does not bruise/bleed easily.     EKGs/Labs/Other Studies Reviewed:    The following studies were reviewed today: Echocardiogram scheduled for 08/27/20, not yet performed  EKG:  Personally reviewed 07/31/20-NSR at 74 bpm  Recent Labs: 07/08/2020: ALT 16; BUN 15; Creatinine, Ser 0.86; Hemoglobin 13.0; Platelets 340; Potassium 4.0; Sodium 142 07/13/2020: BNP 21.9   Recent Lipid Panel    Component Value Date/Time   CHOL 188 07/08/2020 1528   TRIG 63 07/08/2020 1528   HDL 54 07/08/2020 1528   CHOLHDL 3.5 07/08/2020 1528   LDLCALC 122 (H) 07/08/2020 1528    Physical Exam:    VS:  BP (!) 140/98   Pulse 74   Ht 5\' 5"  (1.651 m)   Wt 242 lb (109.8 kg)   SpO2 98%   BMI 40.27 kg/m     Wt Readings from Last 3 Encounters:   08/02/20 242 lb (109.8 kg)  07/08/20 245 lb 12.8 oz (111.5 kg)  05/13/20 247 lb (112 kg)    GEN: Well nourished, well developed in no acute distress HEENT: Normal, moist mucous membranes NECK: No JVD CARDIAC: regular rhythm, normal S1 and S2, no rubs or gallops. No murmur. VASCULAR: Radial and DP pulses 2+ bilaterally. No carotid bruits RESPIRATORY:  Clear to auscultation without rales, wheezing or rhonchi  ABDOMEN: Soft, non-tender, non-distended MUSCULOSKELETAL:  Ambulates independently SKIN: Warm and dry, +1 right LE edema, trace left LE edema NEUROLOGIC:  Alert and oriented x 3. No focal neuro deficits noted. PSYCHIATRIC:  Normal affect    ASSESSMENT:    1. DOE (dyspnea on exertion)   2. Essential hypertension   3. Family history of heart disease   4. Class 3 severe obesity due to excess calories with serious comorbidity and body mass index (BMI) of 40.0 to 44.9 in adult [facility])   5. Cardiac risk counseling   6. Counseling on health promotion and disease prevention   7. At increased risk for cardiovascular disease    PLAN:    Dyspnea on exertion: We spent significant time today reviewing different parts of the cardiovascular system (electrical, vascular, functional, and valvular). We discussed how each of these systems can present with different symptoms. We reviewed that there are different ways we evaluate these symptoms with tests. We reviewed which tests I think are most appropriate given the symptoms, and we discussed risks/benefits and limitations of each of these tests. Please see summary below. We also discussed that if testing is unrevealing for a cardiac cause of the symptoms, there are many noncardiac causes as well that can contribute to symptoms. If the heart is ruled out, then I recommend returning to PCP to discuss alternative diagnoses. -echocardiogram already ordered/scheduled -discussed treadmill stress, nuclear stress/lexiscan, and CT coronary angiography.  Discussed pros and cons of each, including but not limited to false positive/false negative risk, radiation risk, and risk of IV contrast dye. Based on shared decision making, decision was made to pursue exercise treadmill test first per patient preference -counseled on red flag warning signs that need immediate medical attention  Hypertension: goal <130/80 -above goal today, but states usually well controlled -continue amlodipine, furosemide, hydralazine, metoprolol, olmesartan -may need to adjust regimen if still elevated at follow up  Hyperlipidemia: -per [facility], Tchol 188, HDL 54, LDL 122, TG 63 07/08/20 -we discussed her  elevated ASCVD risk and recommendations for statins. She declines unless abnormalities found on her stress test  Cardiac risk counseling and prevention recommendations: -recommend heart healthy/Mediterranean diet, with whole grains, fruits, vegetable, fish, lean meats, nuts, and olive oil. Limit salt. -recommend moderate walking, 3-5 times/week for 30-50 minutes each session. Aim for at least 150 minutes.week. Goal should be pace of 3 miles/hours, or walking 1.5 miles in 30 minutes -recommend avoidance of tobacco products. Avoid excess alcohol. -Additional risk factor control:  -Diabetes risk: A1c is 5.9 per [facility] 05/13/20  -Weight: Class 3 obesity, BMI 40 -ASCVD risk score: The 10-year ASCVD risk score Mikey Bussing DC Brooke Bonito., et al., 2013) is: 29.1%   Values used to calculate the score:     Age: 27 years     Sex: Female     Is Non-Hispanic African American: Yes     Diabetic: Yes     Tobacco smoker: No     Systolic Blood Pressure: 885 mmHg     Is BP treated: Yes     HDL Cholesterol: 54 mg/dL     Total Cholesterol: 188 mg/dL    Plan for follow up: 6 weeks  Buford Dresser, MD, [name], [facility]    Medication Adjustments/Labs and Tests Ordered: Current medicines are reviewed at length with the patient today.  Concerns regarding medicines are  outlined above.  Orders Placed This Encounter  Procedures  . EXERCISE TOLERANCE TEST (ETT)  . EKG 12-Lead   No orders of the defined types were placed in this encounter.   Patient Instructions  Medication Instructions:  Your Physician recommend you continue on your current medication as directed.    *If you need a refill on your cardiac medications before your next appointment, please call your pharmacy*   Lab Work: None   Testing/Procedures: Your physician has requested that you have an exercise tolerance test. For further information please visit [email or web address]. Please also follow instruction sheet, as given. [place]  You will need to have the coronavirus test completed prior to your procedure. This is a Drive Up Visit at [address], [place], [place] [address]. Please tell them that you are there for procedure testing. Stay in your car and someone will be with you shortly. Please make sure to have all other labs completed before this test because you will need to stay quarantined until your procedure.    Follow-Up: At [facility], you and your health needs are our priority.  As part of our continuing mission to provide you with exceptional heart care, we have created designated Provider Care Teams.  These Care Teams include your primary Cardiologist (physician) and Advanced Practice Providers (APPs -  Physician Assistants and Nurse Practitioners) who all work together to provide you with the care you need, when you need it.  We recommend signing up for the patient portal called "MyChart".  Sign up information is provided on this After Visit Summary.  MyChart is used to connect with patients for Virtual Visits (Telemedicine).  Patients are able to view lab/test results, encounter notes, upcoming appointments, etc.  Non-urgent messages can be sent to your provider as well.   To learn more about what you can do with MyChart, go to  [email or web address].    Your next appointment:   6 week(s)  The format for your next appointment:   In Person  Provider:   Buford Dresser, MD    [facility] [place]  [address] [place], [place] [address] Phone:  [phone]        You are scheduled for an Exercise Stress Test  Please arrive 15 minutes prior to your appointment time for registration and insurance purposes.  The test will take approximately 45 minutes to complete.  How to prepare for your Exercise Stress Test: Do bring a list of your current medications with you.  If not listed below, you may take your medications as normal. . Do not take metoprolol (Lopressor, Toprol) for 24 hours prior to the test.  Bring the medication to your appointment as you may be required to take it once the test is complete. . Do wear comfortable clothes (no dresses or overalls) and walking shoes, tennis shoes preferred (no heels or open toed shoes are allowed) . Do Not wear cologne, perfume, aftershave or lotions (deodorant is allowed). . Please report to [place], [address] for your test.  If these instructions are not followed, your test will have to be rescheduled.  If you have questions or concerns about your appointment, you can call the Stress Lab at [phone].  If you cannot keep your appointment, please provide 24 hours notification to the Stress Lab, to avoid a possible $50 charge to your account        I,Alexis Bryant,acting as a scribe for Buford Dresser, MD.,have documented all relevant documentation on the behalf of Buford Dresser, MD,as directed by  Buford Dresser, MD while in the presence of Buford Dresser, MD.   Signed, Buford Dresser, MD [name] 08/02/2020  [place]

## 2020-08-03 ENCOUNTER — Encounter: Payer: Self-pay | Admitting: Internal Medicine

## 2020-08-03 ENCOUNTER — Encounter: Payer: Self-pay | Admitting: Cardiology

## 2020-08-04 ENCOUNTER — Telehealth: Payer: Self-pay

## 2020-08-04 NOTE — Addendum Note (Signed)
Addended by: Buford Dresser A on: 08/04/2020 04:57 PM   Modules accepted: Orders

## 2020-08-04 NOTE — Addendum Note (Signed)
Addended by: Meryl Crutch on: 08/04/2020 01:45 PM   Modules accepted: Orders

## 2020-08-04 NOTE — Telephone Encounter (Signed)
The pt wanted the name of the office where she went for ENT.  The pt was transferred to referrals.  I was unable to find the name of the doctor.

## 2020-08-12 ENCOUNTER — Other Ambulatory Visit: Payer: Self-pay

## 2020-08-12 ENCOUNTER — Encounter: Payer: Self-pay | Admitting: Internal Medicine

## 2020-08-12 MED ORDER — RYBELSUS 3 MG PO TABS: ORAL_TABLET | ORAL | 1 refills | Status: DC

## 2020-08-13 ENCOUNTER — Telehealth (HOSPITAL_COMMUNITY): Payer: Self-pay | Admitting: *Deleted

## 2020-08-13 NOTE — Telephone Encounter (Signed)
Close encounter 

## 2020-08-16 ENCOUNTER — Other Ambulatory Visit (HOSPITAL_COMMUNITY)
Admission: RE | Admit: 2020-08-16 | Discharge: 2020-08-16 | Disposition: A | Discharge status: Home / Self Care | Payer: Medicare HMO | Source: Ambulatory Visit | Attending: Cardiovascular Disease | Admitting: Cardiovascular Disease

## 2020-08-16 DIAGNOSIS — Z01812 Encounter for preprocedural laboratory examination: Secondary | ICD-10-CM | POA: Insufficient documentation

## 2020-08-16 DIAGNOSIS — Z20822 Contact with and (suspected) exposure to covid-19: Secondary | ICD-10-CM | POA: Insufficient documentation

## 2020-08-16 LAB — SARS CORONAVIRUS 2 (TAT 6-24 HRS): SARS Coronavirus 2: NEGATIVE

## 2020-08-18 ENCOUNTER — Ambulatory Visit (HOSPITAL_COMMUNITY)
Admission: RE | Admit: 2020-08-18 | Discharge: 2020-08-18 | Disposition: A | Discharge status: Home / Self Care | Payer: Medicare HMO | Source: Ambulatory Visit | Attending: Cardiovascular Disease | Admitting: Cardiovascular Disease

## 2020-08-18 ENCOUNTER — Other Ambulatory Visit: Payer: Self-pay

## 2020-08-18 DIAGNOSIS — R0609 Other forms of dyspnea: Secondary | ICD-10-CM

## 2020-08-18 DIAGNOSIS — R0600 Dyspnea, unspecified: Secondary | ICD-10-CM | POA: Diagnosis not present

## 2020-08-18 LAB — EXERCISE TOLERANCE TEST
Estimated workload: 6.1 METS
Exercise duration (min): 4 min
Exercise duration (sec): 15 s
MPHR: 150 {beats}/min
Peak HR: 142 {beats}/min
Percent HR: 94 %
Rest HR: 75 {beats}/min

## 2020-08-25 ENCOUNTER — Other Ambulatory Visit: Payer: Self-pay | Admitting: Internal Medicine

## 2020-08-27 ENCOUNTER — Ambulatory Visit (HOSPITAL_COMMUNITY): Payer: Medicare HMO | Attending: Cardiovascular Disease

## 2020-08-27 ENCOUNTER — Other Ambulatory Visit: Payer: Self-pay

## 2020-08-27 DIAGNOSIS — R0600 Dyspnea, unspecified: Secondary | ICD-10-CM

## 2020-08-27 DIAGNOSIS — R0609 Other forms of dyspnea: Secondary | ICD-10-CM

## 2020-08-27 LAB — ECHOCARDIOGRAM COMPLETE
Area-P 1/2: 2.48 cm2
S' Lateral: 3.2 cm

## 2020-08-31 ENCOUNTER — Encounter: Payer: Self-pay | Admitting: Internal Medicine

## 2020-09-09 ENCOUNTER — Ambulatory Visit (INDEPENDENT_AMBULATORY_CARE_PROVIDER_SITE_OTHER): Payer: Medicare HMO | Admitting: Internal Medicine

## 2020-09-09 ENCOUNTER — Encounter: Payer: Self-pay | Admitting: Internal Medicine

## 2020-09-09 ENCOUNTER — Other Ambulatory Visit: Payer: Self-pay

## 2020-09-09 VITALS — BP 132/84 | HR 73 | Temp 98.1°F | Ht 64.8 in | Wt 237.2 lb

## 2020-09-09 DIAGNOSIS — E7800 Pure hypercholesterolemia, unspecified: Secondary | ICD-10-CM

## 2020-09-09 DIAGNOSIS — I10 Essential (primary) hypertension: Secondary | ICD-10-CM

## 2020-09-09 DIAGNOSIS — Z6839 Body mass index (BMI) 39.0-39.9, adult: Secondary | ICD-10-CM

## 2020-09-09 DIAGNOSIS — R7303 Prediabetes: Secondary | ICD-10-CM

## 2020-09-09 DIAGNOSIS — Z23 Encounter for immunization: Secondary | ICD-10-CM | POA: Diagnosis not present

## 2020-09-09 MED ORDER — TETANUS-DIPHTH-ACELL PERTUSSIS 5-2.5-18.5 LF-MCG/0.5 IM SUSP: 0.5000 mL | Freq: Once | INTRAMUSCULAR | 0 refills | Status: AC

## 2020-09-09 NOTE — Progress Notes (Signed)
I,Yamilka Roman [employer] as a Education administrator for Maximino Greenland, MD.,have documented all relevant documentation on the behalf of Maximino Greenland, MD,as directed by  Maximino Greenland, MD while in the presence of Maximino Greenland, MD.  This visit occurred during the SARS-CoV-2 public health emergency.  Safety protocols were in place, including screening questions prior to the visit, additional usage of staff PPE, and extensive cleaning of exam room while observing appropriate contact time as indicated for disinfecting solutions.  Subjective:     Patient ID: Robin Odom , female    DOB: [birth year] , 70 y.o.   MRN: [id number]   Chief Complaint  Patient presents with  . Prediabetes  . Hypertension    HPI  She is here today for a prediabetes/HTN check.  She is tolerating Rybelsus without ill effects. She is not having any issues with her BP meds.  She is due for her Tdap but would like to wait until next visit.   Hypertension This is a chronic problem. The current episode started yesterday. The problem has been gradually improving since onset. Risk factors for coronary artery disease include obesity and post-menopausal state. Past treatments include direct vasodilators, angiotensin blockers and diuretics.     Past Medical History:  Diagnosis Date  . Arthritis   . Diabetes mellitus without complication ([place])   . Gout   . Hypertension   . Partial retinal tear of both eyes without detachment 08/29/2017     Family History  Problem Relation Age of Onset  . Heart disease Mother   . Hypertension Mother   . Diabetes Mother   . Arthritis Father   . Hypertension Father   . Prostate cancer Father   . Parkinson's disease Father   . Heart disease Father   . Sinusitis Sister   . Allergic rhinitis Sister   . Diabetes Sister   . High Cholesterol Sister   . Hypertension Sister   . Diabetes Brother   . Hypertension Brother   . High Cholesterol Brother   . Hypertension Brother   . Angioedema Neg  Hx   . Asthma Neg Hx   . Eczema Neg Hx   . Immunodeficiency Neg Hx   . Urticaria Neg Hx   . Breast cancer Neg Hx      Current Outpatient Medications:  .  allopurinol (ZYLOPRIM) 300 MG tablet, TAKE 1 TABLET BY MOUTH EVERY DAY, Disp: 90 tablet, Rfl: 0 .  amLODipine (NORVASC) 5 MG tablet, TAKE 1 TABLET BY MOUTH EVERY DAY, Disp: 90 tablet, Rfl: 2 .  Ascorbic Acid (VITAMIN C PO), Take 2,000 mg by mouth daily., Disp: , Rfl:  .  azelastine (ASTELIN) 0.1 % nasal spray, 1-2 sprays each nostril twice daily as needed, Disp: 30 mL, Rfl: 5 .  B Complex Vitamins (VITAMIN-B COMPLEX) TABS, Take by mouth., Disp: , Rfl:  .  BENFOTIAMINE PO, Take by mouth 2 (two) times daily., Disp: , Rfl:  .  Calcium Carbonate-Vit D-Min (CALCIUM 1200 PO), Take 600 mg by mouth daily. , Disp: , Rfl:  .  Cholecalciferol (VITAMIN D3) 3000 units TABS, Take by mouth. , Disp: , Rfl:  .  colchicine 0.6 MG tablet, Take 0.6 mg by mouth as needed., Disp: , Rfl:  .  fluticasone (FLOVENT HFA) 110 MCG/ACT inhaler, Inhale 2 puffs into the lungs 2 (two) times daily., Disp: 1 Inhaler, Rfl: 5 .  furosemide (LASIX) 20 MG tablet, TAKE 1 TABLET BY MOUTH EVERY DAY, Disp: 90 tablet, Rfl: 1 .  hydrALAZINE (APRESOLINE) 25 MG tablet, TAKE 1 TABLET BY MOUTH TWICE A DAY, Disp: 180 tablet, Rfl: 1 .  ipratropium (ATROVENT) 0.06 % nasal spray, Place 2 sprays into both nostrils as needed for rhinitis., Disp: , Rfl:  .  magnesium oxide (MAG-OX) 400 MG tablet, Take 400 mg by mouth daily., Disp: , Rfl:  .  metoprolol succinate (TOPROL-XL) 50 MG 24 hr tablet, TAKE 1 TABLET (50 MG TOTAL) BY MOUTH DAILY. TAKE WITH OR IMMEDIATELY FOLLOWING A MEAL., Disp: 90 tablet, Rfl: 1 .  metoprolol tartrate (LOPRESSOR) 50 MG tablet, Take by mouth., Disp: , Rfl:  .  Multiple Vitamin (MULTIVITAMIN+) LIQD, Take by mouth., Disp: , Rfl:  .  olmesartan (BENICAR) 40 MG tablet, TAKE 1 TABLET BY MOUTH EVERY DAY, Disp: 90 tablet, Rfl: 2 .  omega-3 acid ethyl esters (LOVAZA) 1 g  capsule, Take 2 g by mouth 2 (two) times daily. Reported on 10/20/2015, Disp: , Rfl:  .  potassium chloride (MICRO-K) 10 MEQ CR capsule, TAKE 1 CAPSULE BY MOUTH EVERY DAY, Disp: 90 capsule, Rfl: 1 .  Semaglutide (RYBELSUS) 3 MG TABS, Take one tablet by mouth 38mins before breakfast with 4oz water., Disp: 90 tablet, Rfl: 1 .  TURMERIC CURCUMIN PO, Take by mouth daily., Disp: , Rfl:  .  Zinc 50 MG TABS, Take 1 tablet by mouth daily., Disp: , Rfl:    Allergies  Allergen Reactions  . Codeine Other (See Comments)    Hallucinations  . Penicillins Hives     Review of Systems  Constitutional: Negative.   Respiratory: Negative.   Cardiovascular: Negative.   Neurological: Negative.   Psychiatric/Behavioral: Negative.      Today's Vitals   09/09/20 1449  BP: 132/84  Pulse: 73  Temp: 98.1 F (36.7 C)  TempSrc: Oral  Weight: 237 lb 3.2 oz (107.6 kg)  Height: 5' 4.8" (1.646 m)   Body mass index is 39.72 kg/m.  Wt Readings from Last 3 Encounters:  09/09/20 237 lb 3.2 oz (107.6 kg)  08/02/20 242 lb (109.8 kg)  07/08/20 245 lb 12.8 oz (111.5 kg)    Objective:  Physical Exam Vitals and nursing note reviewed.  Constitutional:      Appearance: Normal appearance. She is obese.  HENT:     Head: Normocephalic and atraumatic.     Nose:     Comments: Masked     Mouth/Throat:     Comments: Masked  Cardiovascular:     Rate and Rhythm: Normal rate and regular rhythm.     Heart sounds: Normal heart sounds.  Pulmonary:     Effort: Pulmonary effort is normal.     Breath sounds: Normal breath sounds.  Musculoskeletal:     Cervical back: Normal range of motion.  Skin:    General: Skin is warm.  Neurological:     General: No focal deficit present.     Mental Status: She is alert.  Psychiatric:        Mood and Affect: Mood normal.        Behavior: Behavior normal.         Assessment And Plan:     1. Prediabetes Comments: I will check labs as listed below. She agrees to [facility]  referral as well.  - Hemoglobin A1c - AMB Referral to [facility] - Insulin, random([phone])  2. Essential hypertension, benign Comments: Chronic, controlled. She is encouraged to limit her intake of packaged foods. I will not make any medication changes.  - AMB Referral  to [facility]  3. Pure hypercholesterolemia Comments: She does not wish to take statin meds, most recent results reviewed in detail.  LDL 122 in March 2022.   4. Class 2 severe obesity due to excess calories with serious comorbidity and body mass index (BMI) of 39.0 to 39.9 in adult [facility]) Comments: She was congratulated on her 5 pound weight loss.  She is encouraged to strive for BMI less than 30 to decrease cardiac risk. Advised to aim for at least 150 mi  5. Immunization due Comments: I will send rx Tdap(Boostrix) to her local pharmacy.   Patient was given opportunity to ask questions. Patient verbalized understanding of the plan and was able to repeat key elements of the plan. All questions were answered to their satisfaction.   I, Maximino Greenland, MD, have reviewed all documentation for this visit. The documentation on 09/09/20 for the exam, diagnosis, procedures, and orders are all accurate and complete.   IF YOU HAVE BEEN REFERRED TO A SPECIALIST, IT MAY TAKE 1-2 WEEKS TO SCHEDULE/PROCESS THE REFERRAL. IF YOU HAVE NOT HEARD FROM US/SPECIALIST IN TWO WEEKS, PLEASE GIVE [place] A CALL AT [phone] X [phone].   THE PATIENT IS ENCOURAGED TO PRACTICE SOCIAL DISTANCING DUE TO THE COVID-19 PANDEMIC.

## 2020-09-09 NOTE — Patient Instructions (Signed)

## 2020-09-10 LAB — HEMOGLOBIN A1C
Est. average glucose Bld gHb Est-mCnc: 123 mg/dL
Hgb A1c MFr Bld: 5.9 % — ABNORMAL HIGH (ref 4.8–5.6)

## 2020-09-10 LAB — INSULIN, RANDOM: INSULIN: 17.3 u[IU]/mL (ref 2.6–24.9)

## 2020-09-13 ENCOUNTER — Ambulatory Visit: Payer: Medicare HMO | Admitting: Cardiology

## 2020-09-14 ENCOUNTER — Telehealth: Payer: Self-pay | Admitting: *Deleted

## 2020-09-14 NOTE — Chronic Care Management (AMB) (Signed)
  Chronic Care Management   Note  09/14/2020 Name: Robin Odom MRN: [id number] DOB: January 09, 1951  Robin Odom is a 70 y.o. year old female who is a primary care patient of Glendale Chard, MD. I reached out to Consuello Bossier by phone today in response to a referral sent by [name] PCP, Dr. Baird Cancer.     [name] was given information about Chronic Care Management services today including:  1. CCM service includes personalized support from designated clinical staff supervised by her physician, including individualized plan of care and coordination with other care providers 2. 24/7 contact phone numbers for assistance for urgent and routine care needs. 3. Service will only be billed when office clinical staff spend 20 minutes or more in a month to coordinate care. 4. Only one practitioner may furnish and bill the service in a calendar month. 5. The patient may stop CCM services at any time (effective at the end of the month) by phone call to the office staff. 6. The patient will be responsible for cost sharing (co-pay) of up to 20% of the service fee (after annual deductible is met).  Patient agreed to services and verbal consent obtained.   Follow up plan: Telephone appointment with care management team member scheduled for:  BSW outreach for [facility] on 10/05/2020 PharmD 10/20/2020  [place] Management  Direct Dial: [phone]

## 2020-09-29 ENCOUNTER — Encounter: Payer: Self-pay | Admitting: Cardiology

## 2020-09-29 ENCOUNTER — Ambulatory Visit: Payer: Medicare HMO | Admitting: Cardiology

## 2020-09-29 ENCOUNTER — Other Ambulatory Visit: Payer: Self-pay

## 2020-09-29 VITALS — BP 142/72 | HR 65 | Ht 65.0 in | Wt 235.8 lb

## 2020-09-29 DIAGNOSIS — Z7189 Other specified counseling: Secondary | ICD-10-CM | POA: Diagnosis not present

## 2020-09-29 DIAGNOSIS — R0600 Dyspnea, unspecified: Secondary | ICD-10-CM | POA: Diagnosis not present

## 2020-09-29 DIAGNOSIS — Z8249 Family history of ischemic heart disease and other diseases of the circulatory system: Secondary | ICD-10-CM

## 2020-09-29 DIAGNOSIS — Z712 Person consulting for explanation of examination or test findings: Secondary | ICD-10-CM

## 2020-09-29 DIAGNOSIS — I10 Essential (primary) hypertension: Secondary | ICD-10-CM

## 2020-09-29 DIAGNOSIS — R0609 Other forms of dyspnea: Secondary | ICD-10-CM

## 2020-09-29 MED ORDER — AMLODIPINE BESYLATE 10 MG PO TABS: 10.0000 mg | ORAL_TABLET | Freq: Every day | ORAL | Status: DC

## 2020-09-29 NOTE — Progress Notes (Signed)
Cardiology Office Note:    Date:  09/29/2020   ID:  Sagan Wurzel, DOB [birth year], MRN [id number]  PCP:  Glendale Chard, MD  Cardiologist:  Buford Dresser, MD  Referring MD: Glendale Chard, MD   CC: follow up  History of Present Illness:    Robin Odom is a 70 y.o. female with a hx of hypertension, asthma who is seen for follow up today. I initially met her 08/02/20 as a new consult at the request of Glendale Chard, MD for the evaluation and management of dyspnea on exertion.  CV risk: hypertension, hyperlipidemia, family history of heart disease. OSA on CPAP.   Today: Reviewed her echo and treadmill stress results. Had hypertensive response to exercise; echo also with diastolic dysfunction and mildly elevated filling pressure based on tissue doppler. LVEF normal, no significant valve disease.  Based on this, asked her to log BP at home. Reviewed today. Range 129/75-154/92, average mid 130s/80 but variable. Peak BP with exercise went to 216/109.  Reviewed current antihypertensives: Amlodipine 5 mg daily (at night). Has never been on higher dose. Does already have chronic LE edema. Already wears compression stockings. Furosemide 20 mg (usually once/day, two if swollen) Hydralazine 25 mg BID (noon and 5-6 PM)--would wean this if BP better on higher dose on amlodipine Metoprolol succinate 50 mg daily (in the morning)--discussed changing to carvedilol next Olmesartan 40 mg daily (in the morning)  Never been on spironolactone.  Denies chest pain, shortness of breath at rest or with normal exertion. No PND, orthopnea, LE edema or unexpected weight gain. No syncope or palpitations.   Past Medical History:  Diagnosis Date  . Arthritis   . Diabetes mellitus without complication ([place])   . Gout   . Hypertension   . Partial retinal tear of both eyes without detachment 08/29/2017    Past Surgical History:  Procedure Laterality Date  . ABDOMINAL HYSTERECTOMY  1991  . CARPAL TUNNEL  RELEASE Right 2019  . COLONOSCOPY     5 between 1994-2010  . Left knee surgery  2006  . ROTATOR CUFF REPAIR Left 2001  . ROTATOR CUFF REPAIR Right 2003  . SHOULDER SURGERY  2001/2003  . TONSILECTOMY/ADENOIDECTOMY WITH MYRINGOTOMY  1971    Current Medications: Current Outpatient Medications on File Prior to Visit  Medication Sig  . allopurinol (ZYLOPRIM) 300 MG tablet TAKE 1 TABLET BY MOUTH EVERY DAY  . azelastine (ASTELIN) 0.1 % nasal spray 1-2 sprays each nostril twice daily as needed  . B Complex Vitamins (VITAMIN-B COMPLEX) TABS Take by mouth.  . BENFOTIAMINE PO Take by mouth 2 (two) times daily.  . Calcium Carbonate-Vit D-Min (CALCIUM 1200 PO) Take 600 mg by mouth daily.   . Cholecalciferol (VITAMIN D3) 125 MCG (5000 UT) CAPS Take by mouth.   . colchicine 0.6 MG tablet Take 0.6 mg by mouth as needed.  . furosemide (LASIX) 20 MG tablet TAKE 1 TABLET BY MOUTH EVERY DAY  . hydrALAZINE (APRESOLINE) 25 MG tablet TAKE 1 TABLET BY MOUTH TWICE A DAY  . magnesium oxide (MAG-OX) 400 MG tablet Take 400 mg by mouth daily.  . metoprolol succinate (TOPROL-XL) 50 MG 24 hr tablet TAKE 1 TABLET (50 MG TOTAL) BY MOUTH DAILY. TAKE WITH OR IMMEDIATELY FOLLOWING A MEAL.  . Multiple Vitamin (MULTIVITAMIN+) LIQD Take by mouth.  . olmesartan (BENICAR) 40 MG tablet TAKE 1 TABLET BY MOUTH EVERY DAY  . Omega-3 Fatty Acids (OMEGA 3 PO) Take 2,500 mg by mouth daily. 2 tablets by mouth daily  .  potassium chloride (MICRO-K) 10 MEQ CR capsule TAKE 1 CAPSULE BY MOUTH EVERY DAY  . Semaglutide (RYBELSUS) 3 MG TABS Take one tablet by mouth 77mns before breakfast with 4oz water.  . TURMERIC CURCUMIN PO Take by mouth daily.  . Zinc 50 MG TABS Take 1 tablet by mouth daily.   No current facility-administered medications on file prior to visit.     Allergies:   Codeine and Penicillins   Social History   Tobacco Use  . Smoking status: Never Smoker  . Smokeless tobacco: Never Used  Vaping Use  . Vaping Use:  Never used  Substance Use Topics  . Alcohol use: Yes    Comment: rarely  . Drug use: No    Family History: family history includes Allergic rhinitis in her sister; Arthritis in her father; Diabetes in her brother, mother, and sister; Heart disease in her father and mother; High Cholesterol in her brother and sister; Hypertension in her brother, brother, father, mother, and sister; Parkinson's disease in her father; Prostate cancer in her father; Sinusitis in her sister. There is no history of Angioedema, Asthma, Eczema, Immunodeficiency, Urticaria, or Breast cancer.   3/4 of her siblings has HTN. 2/4 of her siblings has DM. Mother had Type II DM(most of maternal siblings had DM). Mother had unknown CAD and underwent a bypass surgery three years prior to death of a MI in 186 Most of her maternal sibling died from MI. Father had Type II  DM(Paternal sibling no DM). Unaware of any strokes on her fathers side, mostly cancer.    ROS:   Please see the history of present illness.  Additional pertinent ROS otherwise unremarkable.  EKGs/Labs/Other Studies Reviewed:    The following studies were reviewed today: Echo 08/27/20 1. Left ventricular ejection fraction, by estimation, is 60 to 65%. The  left ventricle has normal function. The left ventricle has no regional  wall motion abnormalities. Left ventricular diastolic parameters are  consistent with Grade I diastolic  dysfunction (impaired relaxation). Elevated left atrial pressure. The E/e'  is 193  2. Right ventricular systolic function is normal. The right ventricular  size is normal.  3. Left atrial size was mildly dilated.  4. The mitral valve is normal in structure. No evidence of mitral valve  regurgitation. No evidence of mitral stenosis.  5. The aortic valve is normal in structure. Aortic valve regurgitation is  not visualized. No aortic stenosis is present.  6. The inferior vena cava is normal in size with greater than 50%   respiratory variability, suggesting right atrial pressure of 3 mmHg.   ETT 08/18/20   Blood pressure demonstrated a hypertensive response to exercise.  There was no ST segment deviation noted during stress.   Normal ETT HTN response to exercise   EKG:  Personally reviewed 07/31/20-NSR at 74 bpm  Recent Labs: 07/08/2020: ALT 16; BUN 15; Creatinine, Ser 0.86; Hemoglobin 13.0; Platelets 340; Potassium 4.0; Sodium 142 07/13/2020: BNP 21.9   Recent Lipid Panel    Component Value Date/Time   CHOL 188 07/08/2020 1528   TRIG 63 07/08/2020 1528   HDL 54 07/08/2020 1528   CHOLHDL 3.5 07/08/2020 1528   LDLCALC 122 (H) 07/08/2020 1528    Physical Exam:    VS:  BP (!) 142/72   Pulse 65   Ht 5' 5"  (1.651 m)   Wt 235 lb 12.8 oz (107 kg)   SpO2 98%   BMI 39.24 kg/m     Wt Readings from Last 3  Encounters:  09/29/20 235 lb 12.8 oz (107 kg)  09/09/20 237 lb 3.2 oz (107.6 kg)  08/02/20 242 lb (109.8 kg)    GEN: Well nourished, well developed in no acute distress HEENT: Normal, moist mucous membranes NECK: No JVD CARDIAC: regular rhythm, normal S1 and S2, no rubs or gallops. No murmur. VASCULAR: Radial and DP pulses 2+ bilaterally. No carotid bruits RESPIRATORY:  Clear to auscultation without rales, wheezing or rhonchi  ABDOMEN: Soft, non-tender, non-distended MUSCULOSKELETAL:  Ambulates independently SKIN: Warm and dry, trace bilateral ankle edema NEUROLOGIC:  Alert and oriented x 3. No focal neuro deficits noted. PSYCHIATRIC:  Normal affect     ASSESSMENT:    1. DOE (dyspnea on exertion)   2. Essential hypertension   3. Family history of heart disease   4. Cardiac risk counseling   5. Counseling on health promotion and disease prevention   6. Encounter to discuss test results    PLAN:    Dyspnea on exertion: Hypertension: goal <130/80 -we reviewed her echo and treadmill stress. Both suggest that elevated blood pressure may be causing some of her findings, both  diastolic dysfunction and hypertensive BP response to exercise. Otherwise, structurally normal heart, no ischemia -we reviewed her med regimen at length.  Amlodipine 5 mg daily (at night)-->will increase to 10 mg. Monitor for worsening swelling. Will contact me if this is unmanageable. Furosemide 20 mg (usually once/day, two if swollen)-->continue Hydralazine 25 mg BID (noon and 5-6 PM)--would wean this if BP better on higher dose on amlodipine Metoprolol succinate 50 mg daily (in the morning)--discussed changing to carvedilol next Olmesartan 40 mg daily (in the morning)-->continue Never been on spironolactone. Consider if additional med needed after weaning hydralazine -she will contact me via mychart with BP readings and we will adjust regimen accordingly. -discussed exercise, sodium, how lifestyle can also affect BP  Hyperlipidemia: -per [facility], Tchol 188, HDL 54, LDL 122, TG 63 07/08/20 -we discussed her elevated ASCVD risk and recommendations for statins. She declines.  Cardiac risk counseling and prevention recommendations: -recommend heart healthy/Mediterranean diet, with whole grains, fruits, vegetable, fish, lean meats, nuts, and olive oil. Limit salt. -recommend moderate walking, 3-5 times/week for 30-50 minutes each session. Aim for at least 150 minutes.week. Goal should be pace of 3 miles/hours, or walking 1.5 miles in 30 minutes -recommend avoidance of tobacco products. Avoid excess alcohol. -Additional risk factor control:  -Diabetes risk: A1c is 5.9 per [facility] 05/13/20  -Weight: Class 3 obesity, BMI 39 -ASCVD risk score: The 10-year ASCVD risk score Mikey Bussing DC Brooke Bonito., et al., 2013) is: 29.7%   Values used to calculate the score:     Age: 45 years     Sex: Female     Is Non-Hispanic African American: Yes     Diabetic: Yes     Tobacco smoker: No     Systolic Blood Pressure: 175 mmHg     Is BP treated: Yes     HDL Cholesterol: 54 mg/dL     Total Cholesterol: 188 mg/dL    Plan for  follow up: 3 months or sooner as needed (with mychart checkin/adjustments between visits)  Total time of encounter: 41 minutes total time of encounter, including 34 minutes spent in face-to-face patient care. This time includes coordination of care and counseling regarding test results, medication review, and discussion of plan. Remainder of non-face-to-face time involved reviewing chart documents/testing relevant to the patient encounter and documentation in the medical record.  Buford Dresser, MD, [name], [place]  [facility]   Medication Adjustments/Labs and Tests Ordered: Current medicines are reviewed at length with the patient today.  Concerns regarding medicines are outlined above.  No orders of the defined types were placed in this encounter.  Meds ordered this encounter  Medications  . amLODipine (NORVASC) 10 MG tablet    Sig: Take 1 tablet (10 mg total) by mouth daily.    Dispense:  90 tablet    Patient Instructions  Medication Instructions:  Increase amlodipine to 10 mg daily. You may take 2 of the 5 mg until you run out.  *If you need a refill on your cardiac medications before your next appointment, please call your pharmacy*   Lab Work: None ordered today   Testing/Procedures: None ordered today   Follow-Up: At [facility], you and your health needs are our priority.  As part of our continuing mission to provide you with exceptional heart care, we have created designated Provider Care Teams.  These Care Teams include your primary Cardiologist (physician) and Advanced Practice Providers (APPs -  Physician Assistants and Nurse Practitioners) who all work together to provide you with the care you need, when you need it.  We recommend signing up for the patient portal called "MyChart".  Sign up information is provided on this After Visit Summary.  MyChart is used to connect with patients for Virtual Visits (Telemedicine).  Patients are able to view  lab/test results, encounter notes, upcoming appointments, etc.  Non-urgent messages can be sent to your provider as well.   To learn more about what you can do with MyChart, go to [email or web address].    Your next appointment:   3 month(s) @ [address] [place], [place] [address]   The format for your next appointment:   In Person  Provider:   Buford Dresser, MD       Signed, Buford Dresser, MD [name] 09/29/2020  [place]

## 2020-09-29 NOTE — Patient Instructions (Signed)
Medication Instructions:  Increase amlodipine to 10 mg daily. You may take 2 of the 5 mg until you run out.  *If you need a refill on your cardiac medications before your next appointment, please call your pharmacy*   Lab Work: None ordered today   Testing/Procedures: None ordered today   Follow-Up: At [facility], you and your health needs are our priority.  As part of our continuing mission to provide you with exceptional heart care, we have created designated Provider Care Teams.  These Care Teams include your primary Cardiologist (physician) and Advanced Practice Providers (APPs -  Physician Assistants and Nurse Practitioners) who all work together to provide you with the care you need, when you need it.  We recommend signing up for the patient portal called "MyChart".  Sign up information is provided on this After Visit Summary.  MyChart is used to connect with patients for Virtual Visits (Telemedicine).  Patients are able to view lab/test results, encounter notes, upcoming appointments, etc.  Non-urgent messages can be sent to your provider as well.   To learn more about what you can do with MyChart, go to [email or web address].    Your next appointment:   3 month(s) @ [address] [place], [place] [address]   The format for your next appointment:   In Person  Provider:   Buford Dresser, MD

## 2020-10-04 DIAGNOSIS — G4733 Obstructive sleep apnea (adult) (pediatric): Secondary | ICD-10-CM | POA: Diagnosis not present

## 2020-10-05 ENCOUNTER — Ambulatory Visit (INDEPENDENT_AMBULATORY_CARE_PROVIDER_SITE_OTHER): Payer: Medicare HMO

## 2020-10-05 DIAGNOSIS — E7800 Pure hypercholesterolemia, unspecified: Secondary | ICD-10-CM

## 2020-10-05 DIAGNOSIS — I10 Essential (primary) hypertension: Secondary | ICD-10-CM

## 2020-10-05 DIAGNOSIS — R7303 Prediabetes: Secondary | ICD-10-CM

## 2020-10-05 NOTE — Patient Instructions (Signed)
Social Worker Visit Information   Materials provided: No: Patient declined resource needs at this time  Robin Odom was given information about Chronic Care Management services today including:  1. CCM service includes personalized support from designated clinical staff supervised by her physician, including individualized plan of care and coordination with other care providers 2. 24/7 contact phone numbers for assistance for urgent and routine care needs. 3. Service will only be billed when office clinical staff spend 20 minutes or more in a month to coordinate care. 4. Only one practitioner may furnish and bill the service in a calendar month. 5. The patient may stop CCM services at any time (effective at the end of the month) by phone call to the office staff. 6. The patient will be responsible for cost sharing (co-pay) of up to 20% of the service fee (after annual deductible is met).  Patient agreed to services and verbal consent obtained.   Patient verbalizes understanding of instructions provided today and agrees to view in [place].   Follow up plan: No SW follow up planned at this time. Please contact me as needed.    [name], BSW, CDP Social Worker, Certified Dementia Practitioner [place] / [place] [facility] [phone]

## 2020-10-05 NOTE — Chronic Care Management (AMB) (Signed)
Chronic Care Management    Social Work Note  10/05/2020 Name: Robin Odom MRN: [id number] DOB: Mar 29, 1951  Robin Odom is a 70 y.o. year old female who is a primary care patient of Glendale Chard, MD. The CCM team was consulted to assist the patient with chronic disease management and/or care coordination needs related to: HTN, Prediabetes, and Pure Hypercholesterolemia.   Engaged with patient by telephone for initial visit in response to provider referral for social work chronic care management and care coordination services.   Consent to Services:  The patient was given the following information about Chronic Care Management services today, agreed to services, and gave verbal consent: 1. CCM service includes personalized support from designated clinical staff supervised by the primary care provider, including individualized plan of care and coordination with other care providers 2. 24/7 contact phone numbers for assistance for urgent and routine care needs. 3. Service will only be billed when office clinical staff spend 20 minutes or more in a month to coordinate care. 4. Only one practitioner may furnish and bill the service in a calendar month. 5.The patient may stop CCM services at any time (effective at the end of the month) by phone call to the office staff. 6. The patient will be responsible for cost sharing (co-pay) of up to 20% of the service fee (after annual deductible is met). Patient agreed to services and consent obtained.  Patient agreed to services and consent obtained.   Assessment: Review of patient past medical history, allergies, medications, and health status, including review of relevant consultants reports was performed today as part of a comprehensive evaluation and provision of chronic care management and care coordination services.     SDOH (Social Determinants of Health) assessments and interventions performed:  SDOH Interventions   Flowsheet Row Most Recent Value  SDOH  Interventions   Food Insecurity Interventions Intervention Not Indicated  Housing Interventions Intervention Not Indicated  Transportation Interventions Intervention Not Indicated       Advanced Directives Status: Patient reports she does have Advance Directives in place. Copy requested  CCM Care Plan  Allergies  Allergen Reactions  . Codeine Other (See Comments)    Hallucinations  . Penicillins Hives    Outpatient Encounter Medications as of 10/05/2020  Medication Sig  . allopurinol (ZYLOPRIM) 300 MG tablet TAKE 1 TABLET BY MOUTH EVERY DAY  . amLODipine (NORVASC) 10 MG tablet Take 1 tablet (10 mg total) by mouth daily.  Mar[place] Kitchen azelastine (ASTELIN) 0.1 % nasal spray 1-2 sprays each nostril twice daily as needed  . B Complex Vitamins (VITAMIN-B COMPLEX) TABS Take by mouth.  . BENFOTIAMINE PO Take by mouth 2 (two) times daily.  . Calcium Carbonate-Vit D-Min (CALCIUM 1200 PO) Take 600 mg by mouth daily.   . Cholecalciferol (VITAMIN D3) 125 MCG (5000 UT) CAPS Take by mouth.   . colchicine 0.6 MG tablet Take 0.6 mg by mouth as needed.  . furosemide (LASIX) 20 MG tablet TAKE 1 TABLET BY MOUTH EVERY DAY  . hydrALAZINE (APRESOLINE) 25 MG tablet TAKE 1 TABLET BY MOUTH TWICE A DAY  . magnesium oxide (MAG-OX) 400 MG tablet Take 400 mg by mouth daily.  . metoprolol succinate (TOPROL-XL) 50 MG 24 hr tablet TAKE 1 TABLET (50 MG TOTAL) BY MOUTH DAILY. TAKE WITH OR IMMEDIATELY FOLLOWING A MEAL.  . Multiple Vitamin (MULTIVITAMIN+) LIQD Take by mouth.  . olmesartan (BENICAR) 40 MG tablet TAKE 1 TABLET BY MOUTH EVERY DAY  . Omega-3 Fatty Acids (OMEGA 3 PO)  Take 2,500 mg by mouth daily. 2 tablets by mouth daily  . potassium chloride (MICRO-K) 10 MEQ CR capsule TAKE 1 CAPSULE BY MOUTH EVERY DAY  . Semaglutide (RYBELSUS) 3 MG TABS Take one tablet by mouth 45mns before breakfast with 4oz water.  . TURMERIC CURCUMIN PO Take by mouth daily.  . Zinc 50 MG TABS Take 1 tablet by mouth daily.   No  facility-administered encounter medications on file as of 10/05/2020.    Patient Active Problem List   Diagnosis Date Noted  . History of wheezing 03/18/2020  . Osteopenia of multiple sites 10/30/2019  . Prediabetes 05/14/2018  . Essential hypertension, benign 05/14/2018  . Class 3 severe obesity due to excess calories with serious comorbidity and body mass index (BMI) of 40.0 to 44.9 in adult ([place] 05/14/2018  . 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 tophus 07/20/2016  . Primary osteoarthritis of both hands 07/20/2016  . Primary osteoarthritis of both knees 07/20/2016  . Primary osteoarthritis of both feet 07/20/2016  . DJD (degenerative joint disease), cervical 07/20/2016  . Elevated CK 07/20/2016  . History of humerus fracture 07/20/2016  . History of hypertension 07/20/2016  . History of diabetes mellitus 07/20/2016  . History of hyperlipidemia 07/20/2016  . Chronic rhinitis 10/20/2015  . Anosmia 10/20/2015  . Mild intermittent asthma 10/20/2015   Conditions to be addressed/monitored: Prediabetes, HTN, and Pure Hypercholesterolemia  SW placed a successful outbound call to the patient to conduct an SDoH screening. No acute challenges are noted at this time. Patient confirms she does have an Advance Directive in place and will bring a copy to her next office visit within the [place] it to be scanned into her chart. Performed chart review to note patient has an [place] with embedded PharmD VOrlando Penneron 6.22.22, SW scheduled patient an initial appointment with RN Care Manager on 7.21.22.    Follow Up Plan: No SW follow up planned at this time. The patient is encouraged to contact SW as needed with future resource needs. SW collaboration with [place] of patient enrollment status and scheduled appointment for initial RN call.      KDaneen Schick BSW, CDP Social Worker, Certified Dementia Practitioner [place]/ [place] [phone] Total time spent performing care coordination and/or care management activities with the patient by phone or face to face = 40 minutes.

## 2020-10-09 ENCOUNTER — Other Ambulatory Visit: Payer: Self-pay | Admitting: Internal Medicine

## 2020-10-09 DIAGNOSIS — I10 Essential (primary) hypertension: Secondary | ICD-10-CM

## 2020-10-11 ENCOUNTER — Other Ambulatory Visit: Payer: Self-pay | Admitting: Physician Assistant

## 2020-10-11 NOTE — Telephone Encounter (Signed)
Last Visit: 05/12/2020 Next Visit: 11/17/2020 Labs: 07/08/2020 [facility] 25.5   Current Dose per office note 05/12/2020, allopurinol 300 mg 1 tablet by mouth daily DX:  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 tophus    Last Fill: 07/05/2020  Okay to refill Allopurinol?

## 2020-10-15 ENCOUNTER — Telehealth: Payer: Self-pay

## 2020-10-15 NOTE — Chronic Care Management (AMB) (Signed)
Chronic Care Management Pharmacy Assistant   Name: Robin Odom  MRN: [id number] DOB: [birth year]   Reason for Encounter: Chart review for CPP visit on 10-20-2020.   Conditions to be addressed/monitored: HTN, HLD, and DMII   Recent office visits:  10-05-2020 [name] (CCM)  09-09-2020 Glendale Chard, MD. Received Tdap.  A1C 5.9, Insulin random 17.3.  07-08-2020 Glendale Chard, MD. STOP Melatonin 5 mg as needed. CBC w/ diff MCH 25.5, Lipid 122, uric acid 4.1, CMP14+EGFR normal. Echocardiogram placed  05-13-2020 Glendale Chard, MD. Hemoglobin 5.9, Insulin 9.4, BMP8+EGFR 99.  Recent consult visits:  09-29-2020 Buford Dresser, MD (Cardiology). INCREASE Amlodipine 5 mg TO 10 mg. STOP Vitamin C 2,000 mg daily. STOP Flovent inhaler. STOP Atrovent nasal spray.  08-18-2020 Lorretta Harp, MD. Exercise tolerance test.  08-02-2020 Buford Dresser, MD (Cardiology). EKG orders placed. Exercise tolerance test placed.  07-12-2020 Desma Maxim, MD (Ophthalmology)  06-28-2020 Lavonia Drafts, MD (Chiropractic)  06-22-2020 Lavonia Drafts, MD (Chiropractic)  06-01-2020 Stefan Church, MD (Optometry)  05-12-2020 Dale,Taylor Curt Jews (Rheumatology). STOP Valium 2 mg as needed. STOP Kenelog Cream. STOP Flexeril 5 mg as needed. STOP albuterol inhaler as needed.  04-26-2020 [facility]. Covid test. Negative results.  04-20-2020 [facility]. Covid test. Negative results.  04-15-2020 Desma Maxim, MD (Ophthalmology)  Hospital visits:  None in previous 6 months  Medications: Outpatient Encounter Medications as of 10/15/2020  Medication Sig   allopurinol (ZYLOPRIM) 300 MG tablet TAKE 1 TABLET BY MOUTH EVERY DAY   amLODipine (NORVASC) 10 MG tablet Take 1 tablet (10 mg total) by mouth daily.   amLODipine (NORVASC) 5 MG tablet TAKE 1 TABLET BY MOUTH EVERY DAY   azelastine (ASTELIN) 0.1 % nasal spray 1-2 sprays  each nostril twice daily as needed   B Complex Vitamins (VITAMIN-B COMPLEX) TABS Take by mouth.   BENFOTIAMINE PO Take by mouth 2 (two) times daily.   Calcium Carbonate-Vit D-Min (CALCIUM 1200 PO) Take 600 mg by mouth daily.    Cholecalciferol (VITAMIN D3) 125 MCG (5000 UT) CAPS Take by mouth.    colchicine 0.6 MG tablet Take 0.6 mg by mouth as needed.   furosemide (LASIX) 20 MG tablet TAKE 1 TABLET BY MOUTH EVERY DAY   hydrALAZINE (APRESOLINE) 25 MG tablet TAKE 1 TABLET BY MOUTH TWICE A DAY   magnesium oxide (MAG-OX) 400 MG tablet Take 400 mg by mouth daily.   metoprolol succinate (TOPROL-XL) 50 MG 24 hr tablet TAKE 1 TABLET (50 MG TOTAL) BY MOUTH DAILY. TAKE WITH OR IMMEDIATELY FOLLOWING A MEAL.   Multiple Vitamin (MULTIVITAMIN+) LIQD Take by mouth.   olmesartan (BENICAR) 40 MG tablet TAKE 1 TABLET BY MOUTH EVERY DAY   Omega-3 Fatty Acids (OMEGA 3 PO) Take 2,500 mg by mouth daily. 2 tablets by mouth daily   potassium chloride (MICRO-K) 10 MEQ CR capsule TAKE 1 CAPSULE BY MOUTH EVERY DAY   Semaglutide (RYBELSUS) 3 MG TABS Take one tablet by mouth 36mns before breakfast with 4oz water.   TURMERIC CURCUMIN PO Take by mouth daily.   Zinc 50 MG TABS Take 1 tablet by mouth daily.   No facility-administered encounter medications on file as of 10/15/2020.   Have you seen any other providers since your last visit? Patient stated no Any changes in your medications or health? Patient stated cardiology recently increased Amlodipine to 10 mg. Any side effects from any medications? Patient stated no Do you have an symptoms or  problems not managed by your medications? Patient stated allergies which is managed by OTC medication if needed. Any concerns about your health right now? Patient stated no Has your provider asked that you check blood pressure, blood sugar, or follow special diet at home? Patient stated she was instructed to monitor blood pressure daily. Do you get any type of exercise on a  regular basis? Patient states swims with a trainer 5 days/ week. Can you think of a goal you would like to reach for your health? Patient states she would like her goal weight to be 200 ponds and wants to stabilize her blood pressure. Do you have any problems getting your medications? Patient states her Rybelsus and nasal spray is a little expensive and she knows she is over income for patient assistance. Is there anything that you would like to discuss during the appointment? Nothing else besides her goal weight and managing blood pressure better.  Please bring medications and supplements to appointment  NOTES Sent Octavio Manns CMA to cancel recent refill on Amlodipine 42m and send 10 mg instead.  Star Rating Drugs: Olmesartan 40 mg- Last filled 10-12-2020 [facility] Rybelsus 3 mg- Last filled 10-08-2020 [facility]  [place]ptune BeachClinical Pharmacist Assistant [phone]

## 2020-10-18 ENCOUNTER — Other Ambulatory Visit: Payer: Self-pay

## 2020-10-18 MED ORDER — AMLODIPINE BESYLATE 10 MG PO TABS: 10.0000 mg | ORAL_TABLET | Freq: Every day | ORAL | 1 refills | Status: DC

## 2020-10-19 ENCOUNTER — Telehealth: Payer: Self-pay

## 2020-10-19 NOTE — Progress Notes (Signed)
  Patient aware of telephone appointment with Orlando Penner CPP on 10-20-2020 at 10:00. Patient aware to have/bring all medications, supplements, blood pressure and/or blood sugar logs to visit.  [place] Pharmacist Assistant [phone]

## 2020-10-20 ENCOUNTER — Ambulatory Visit: Payer: Medicare HMO

## 2020-10-20 DIAGNOSIS — R7303 Prediabetes: Secondary | ICD-10-CM

## 2020-10-20 DIAGNOSIS — I10 Essential (primary) hypertension: Secondary | ICD-10-CM

## 2020-10-20 DIAGNOSIS — E7800 Pure hypercholesterolemia, unspecified: Secondary | ICD-10-CM | POA: Diagnosis not present

## 2020-10-20 DIAGNOSIS — M1A09X0 Idiopathic chronic gout, multiple sites, without tophus (tophi): Secondary | ICD-10-CM

## 2020-10-20 NOTE — Patient Instructions (Addendum)
Visit Information It was great speaking with you today!  Please let me know if you have any questions about our visit.   Goals Addressed             This Visit's Progress    Manage My Medicine       Timeframe:  Long-Range Goal Priority:  High Start Date:                             Expected End Date:                       Follow Up Date 01/18/2021    - call for medicine refill 2 or 3 days before it runs out - call if I am sick and can't take my medicine - learn to read medicine labels - use a pillbox to sort medicine - use an alarm clock or phone to remind me to take my medicine    Why is this important?   These steps will help you keep on track with your medicines.           Patient Care Plan: CCM Pharmacy Care Plan     Problem Identified: HTN, HLD, Pre-Diabetes, Gout   Priority: High     Long-Range Goal: Disease Management   Start Date: 10/20/2020  This Visit's Progress: On track  Priority: High  Note:    Current Barriers:  Unable to independently monitor therapeutic efficacy Unable to achieve control of hypertension and hyperlipidemia    Pharmacist Clinical Goal(s):  Patient will achieve adherence to monitoring guidelines and medication adherence to achieve therapeutic efficacy through collaboration with PharmD and provider.   Interventions: 1:1 collaboration with Glendale Chard, MD regarding development and update of comprehensive plan of care as evidenced by provider attestation and co-signature Inter-disciplinary care team collaboration (see longitudinal plan of care) Comprehensive medication review performed; medication list updated in electronic medical record  Hypertension (BP goal <130/80) -Controlled -Current treatment: Amlodipine 10 mg tablet once per day Increased from 5 mg Hydralazine 25 mg tablet twice per day Per patient cardiologist might take her off  Metoprolol 50 mg taking 1 tablet by mouth daily Olmesartan 40 mg tablet once per day   -Current home readings: checking her BP at home, 134-140/87-85 to 146/91 when she started tracking her BP on 09/12/2020, around 6/10 - 128/80, 122/80, 120/68, 132/85, 120/74 -Current dietary habits: she enjoys salt and eats a lot potato chips -Current exercise habits: she has a trainer that she works with twice per week, she is in water aerobics three times per week, -Her cardiologist increased her Amlodipine from 5 mg to 10 mg, she reports that she had increased swelling in her ankles, the swelling comes down at night, and she also has compression socks on when she is home.  -Patient reports using an alarm on her phone to remind her to take Hydralazine  -She is also working on elevating her feet for at least 30 minutes a day  -Denies hypotensive/hypertensive symptoms -Educated on Exercise goal of 150 minutes per week; Importance of home blood pressure monitoring; Proper BP monitoring technique; -Counseled to monitor BP at home at least once per day, document, and provide log at future appointments -Recommended to continue current medication  Hyperlipidemia: (LDL goal < 70) -Uncontrolled -Current treatment: None at this time  At this time you are going to follow up in September as see how her cholesterol is  doing.  -Medications previously tried: none at this time  -Current dietary patterns: she no longer eats beef, eats plenty of vegetables, she does not eat a lot of fried foods, she does eat potato chips, she eats plenty of roasted vegetables, and uses a cast iron skillet -During the pandemic she was testing different recipes, she was eating a lot of butter based recipes , she also reports that she was on a bacon eating binge, she also increased the amount of cheese she was eating.  -Current exercise habits: she has a trainer that she works with twice per week, she is in water aerobics three times per week, -She is concerned about statins impacting her joints -Patient is going to continue  rigorous lifestyle changes, and try to bring down her LDL with these changes  -Educated on Cholesterol goals;  Benefits of statin for ASCVD risk reduction; Importance of limiting foods high in cholesterol; Exercise goal of 150 minutes per week; Strategies to manage statin-induced myalgias; -Counseled on diet and exercise extensively   Pre-Diabetes (A1c goal <5.7) -Controlled -Current medications: Rybelsus 3 mg tablet once per day -Denies hypoglycemic/hyperglycemic symptoms -Current meal patterns:  -Current exercise habits: she has a trainer that she works with twice per week, she is in water aerobics three times per week -Educated on Exercise goal of 150 minutes per week; Benefits of weight loss; -Counseled to check feet daily and get yearly eye exams -Recommended to continue current medication Assessed patient finances. She would like to apply for patient assistance for Rybelsus   Gout (Goal: Prevent flare ups) -Controlled -Current treatment  Allopurinol 300 mg tablet daily  Colcrys 0.6 mg tablet as needed for flare up -Recommended to continue current medication    Health Maintenance -Vaccine gaps:   COVID -19 Booster : has already received her second booster   -Current therapy:  Calcium Vitamin 600 mg - taking 1 tablet daily Vitamin D 500 mg - taking 1 tablet daily Benfotiamine Po- taking daily Magnesium Oxide 400 mg tablet once daily Zinc 50 mg taking 1 tablet daily Multivitamin - taking gummies   -Educated on Supplements may interfere with prescription drugs -Patient is going to review her current vitamins and make sure she  -Patient is satisfied with current therapy and denies issues -Recommended to continue current medication  Patient Goals/Self-Care Activities Patient will:  - take medications as prescribed  Follow Up Plan: The patient has been provided with contact information for the care management team and has been advised to call with any health related  questions or concerns.        Ms. Hizer was given information about Chronic Care Management services today including:  CCM service includes personalized support from designated clinical staff supervised by her physician, including individualized plan of care and coordination with other care providers 24/7 contact phone numbers for assistance for urgent and routine care needs. Standard insurance, coinsurance, copays and deductibles apply for chronic care management only during months in which we provide at least 20 minutes of these services. Most insurances cover these services at 100%, however patients may be responsible for any copay, coinsurance and/or deductible if applicable. This service may help you avoid the need for more expensive face-to-face services. Only one practitioner may furnish and bill the service in a calendar month. The patient may stop CCM services at any time (effective at the end of the month) by phone call to the office staff.  Patient agreed to services and verbal consent obtained.   Patient  verbalizes understanding of instructions provided today and agrees to view in [place].   Orlando Penner, PharmD Clinical Pharmacist [facility] [phone]

## 2020-10-20 NOTE — Progress Notes (Signed)
Chronic Care Management Pharmacy Note  10/20/2020 Name:  Robin Odom MRN:  [id number] DOB:  [birth year]  Summary: Patient  presents for initial appointment with the pharmacist.   Recommendations/Changes made from today's visit: Recommend patient receive TDAP vaccine  Recommend patient start statin medication Recommend patient apply for patient assistance for Rybelsus prior to being in the donut hole.   Plan: Patient is going to receive TDAP vaccine at next office visit Patient is going to continue rigorous lifestyle changes, and try to bring down her LDL with these [place] to complete patient assistance paperwork for Rybelsus   Subjective: Robin Odom is an 70 y.o. year old female who is a primary patient of Glendale Chard, MD.  The CCM team was consulted for assistance with disease management and care coordination needs.    Engaged with patient by telephone for initial visit in response to provider referral for pharmacy case management and/or care coordination services. She has been retired for 15 years, she lives independently. She has an overly active life. She has a full time schedule of volunteer work. She does a lot of work with [employer]. She enjoys what she is doing. 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 She is a crafter, loves to cook and exercises. She is apart of a very active bible study group that is 28 -43 years old and they gather online once per month. During COVID they were throwing lawn birthday parties and she looks forward to Sunday morning. She is retired from [employer], and she was also a professor for 10 years.   Consent to Services:  The patient was given the following information about Chronic Care Management services today, agreed to services, and gave verbal  consent: 1. CCM service includes personalized support from designated clinical staff supervised by the primary care provider, including individualized plan of care and coordination with other care providers 2. 24/7 contact phone numbers for assistance for urgent and routine care needs. 3. Service will only be billed when office clinical staff spend 20 minutes or more in a month to coordinate care. 4. Only one practitioner may furnish and bill the service in a calendar month. 5.The patient may stop CCM services at any time (effective at the end of the month) by phone call to the office staff. 6. The patient will be responsible for cost sharing (co-pay) of up to 20% of the service fee (after annual deductible is met). Patient agreed to services and consent obtained.  Patient Care Team: Glendale Chard, MD as PCP - General (Internal Medicine) Buford Dresser, MD as PCP - Cardiology (Cardiology) Rex Kras Claudette Stapler, RN as [place], Sharyn Blitz, [facility] (Pharmacist)  Recent office visits:  10-05-2020 Robin Odom (CCM)   09-09-2020 Glendale Chard, MD. Received Tdap.  A1C 5.9, Insulin random 17.3.   07-08-2020 Glendale Chard, MD. STOP Melatonin 5 mg as needed. CBC w/ diff MCH 25.5, Lipid 122, uric acid 4.1, CMP14+EGFR normal. Echocardiogram placed   05-13-2020 Glendale Chard, MD. Hemoglobin 5.9, Insulin 9.4, BMP8+EGFR 99.   Recent consult visits:  09-29-2020 Buford Dresser, MD (Cardiology). INCREASE Amlodipine 5 mg TO 10 mg. STOP Vitamin C 2,000 mg daily. STOP Flovent inhaler. STOP Atrovent nasal spray.   08-18-2020 Lorretta Harp, MD. Exercise tolerance test.   08-02-2020  Buford Dresser, MD (Cardiology). EKG orders placed. Exercise tolerance test placed.   07-12-2020 Desma Maxim, MD (Ophthalmology)   06-28-2020 Lavonia Drafts, MD (Chiropractic)   06-22-2020 Lavonia Drafts, MD (Chiropractic)   06-01-2020  Stefan Church, MD (Optometry)   05-12-2020 Dale,Taylor Curt Jews (Rheumatology). STOP Valium 2 mg as needed. STOP Kenelog Cream. STOP Flexeril 5 mg as needed. STOP albuterol inhaler as needed.   04-26-2020 [facility]. Covid test. Negative results.   04-20-2020 [facility]. Covid test. Negative results.   04-15-2020 Desma Maxim, MD (Ophthalmology)   Objective:  Lab Results  Component Value Date   CREATININE 0.86 07/08/2020   BUN 15 07/08/2020   GFRNONAA 67 05/13/2020   GFRAA 77 05/13/2020   NA 142 07/08/2020   K 4.0 07/08/2020   CALCIUM 9.8 07/08/2020   CO2 24 07/08/2020   GLUCOSE 86 07/08/2020    Lab Results  Component Value Date/Time   HGBA1C 5.9 (H) 09/09/2020 03:49 PM   HGBA1C 5.9 (H) 05/13/2020 02:14 PM   MICROALBUR 10 05/13/2020 11:14 AM   MICROALBUR 10 04/16/2019 12:24 PM    Last diabetic Eye exam: No results found for: HMDIABEYEEXA  Last diabetic Foot exam: No results found for: HMDIABFOOTEX   Lab Results  Component Value Date   CHOL 188 07/08/2020   HDL 54 07/08/2020   LDLCALC 122 (H) 07/08/2020   TRIG 63 07/08/2020   CHOLHDL 3.5 07/08/2020    Hepatic Function Latest Ref Rng & Units 07/08/2020 02/04/2020 07/15/2019  Total Protein 6.0 - 8.5 g/dL 7.0 6.5 7.1  Albumin 3.8 - 4.8 g/dL 4.4 - 4.6  AST 0 - 40 IU/L 21 17 26   ALT 0 - 32 IU/L 16 13 22   Alk Phosphatase 44 - 121 IU/L 71 - 79  Total Bilirubin 0.0 - 1.2 mg/dL 0.3 0.3 0.3    No results found for: TSH, FREET4  CBC Latest Ref Rng & Units 07/08/2020 02/04/2020 07/29/2019  WBC 3.4 - 10.8 x10E3/uL 10.5 10.4 9.3  Hemoglobin 11.1 - 15.9 g/dL 13.0 12.7 12.6  Hematocrit 34.0 - 46.6 % 40.8 38.6 38.1  Platelets 150 - 450 x10E3/uL 340 316 327    No results found for: VD25OH  Clinical ASCVD: No  The 10-year ASCVD risk score Mikey Bussing DC Jr., et al., 2013) is: 29.7%   Values used to calculate the score:     Age: 70 years     Sex: Female     Is Non-Hispanic African American:  Yes     Diabetic: Yes     Tobacco smoker: No     Systolic Blood Pressure: 500 mmHg     Is BP treated: Yes     HDL Cholesterol: 54 mg/dL     Total Cholesterol: 188 mg/dL    Depression screen [facility] 2/9 05/13/2020 04/16/2019 07/11/2018  Decreased Interest 0 0 0  Down, Depressed, Hopeless 0 0 0  PHQ - 2 Score 0 0 0  Altered sleeping - 3 -  Tired, decreased energy - 0 -  Change in appetite - 0 -  Feeling bad or failure about yourself  - 0 -  Trouble concentrating - 0 -  Moving slowly or fidgety/restless - 0 -  Suicidal thoughts - 0 -  PHQ-9 Score - 3 -  Difficult doing work/chores - Not difficult at all -      Social History   Tobacco Use  Smoking Status Never  Smokeless Tobacco Never   BP  Readings from Last 3 Encounters:  09/29/20 (!) 142/72  09/09/20 132/84  08/02/20 (!) 140/98   Pulse Readings from Last 3 Encounters:  09/29/20 65  09/09/20 73  08/02/20 74   Wt Readings from Last 3 Encounters:  09/29/20 235 lb 12.8 oz (107 kg)  09/09/20 237 lb 3.2 oz (107.6 kg)  08/02/20 242 lb (109.8 kg)   BMI Readings from Last 3 Encounters:  09/29/20 39.24 kg/m  09/09/20 39.72 kg/m  08/02/20 40.27 kg/m    Assessment/Interventions: Review of patient past medical history, allergies, medications, health status, including review of consultants reports, laboratory and other test data, was performed as part of comprehensive evaluation and provision of chronic care management services.   SDOH:  (Social Determinants of Health) assessments and interventions performed: No SDOH Interventions    Flowsheet Row Most Recent Value  SDOH Interventions   Financial Strain Interventions --  [concern about Rybelsus will apply for patient assistance]      SDOH Screenings   Alcohol Screen: Not on file  Depression (PHQ2-9): Low Risk    PHQ-2 Score: 0  Financial Resource Strain: Medium Risk   Difficulty of Paying Living Expenses: Somewhat hard  Food Insecurity: No Food Insecurity   Worried  About Charity fundraiser in the Last Year: Never true   Ran Out of Food in the Last Year: Never true  Housing: Low Risk    Last Housing Risk Score: 0  Physical Activity: Sufficiently Active   Days of Exercise per Week: 5 days   Minutes of Exercise per Session: 30 min  Social Connections: Not on file  Stress: No Stress Concern Present   Feeling of Stress : Only a little  Tobacco Use: Low Risk    Smoking Tobacco Use: Never   Smokeless Tobacco Use: Never  Transportation Needs: No Transportation Needs   Lack of Transportation (Medical): No   Lack of Transportation (Non-Medical): No    CCM Care Plan  Allergies  Allergen Reactions   Codeine Other (See Comments)    Hallucinations   Penicillins Hives    Medications Reviewed Today     Reviewed by Mayford Knife, RPH (Pharmacist) on 10/20/20 at 1047  Med List Status: <None>   Medication Order Taking? Sig Documenting Provider Last Dose Status Informant  allopurinol (ZYLOPRIM) 300 MG tablet [id number] Yes TAKE 1 TABLET BY MOUTH EVERY DAY Ofilia Neas, PA-C Taking Active   amLODipine (NORVASC) 10 MG tablet [id number] Yes Take 1 tablet (10 mg total) by mouth daily. Glendale Chard, MD Taking Active   azelastine (ASTELIN) 0.1 % nasal spray [id number] Yes 1-2 sprays each nostril twice daily as needed Bobbitt, [place] Muta, MD Taking Active   B Complex Vitamins (VITAMIN-B COMPLEX) TABS [id number] Yes Take by mouth. [provider] Taking Active            Med Note Iona Beard, STEPHANIE A   Mon Aug 02, 2020  2:55 PM)    BENFOTIAMINE PO [id number] Yes Take by mouth 2 (two) times daily. [provider] Taking Active   Calcium Carbonate-Vit D-Min (CALCIUM 1200 PO) [id number] Yes Take 600 mg by mouth daily.  [provider] Taking Active   Cholecalciferol (VITAMIN D3) 125 MCG (5000 UT) CAPS [id number] Yes Take by mouth.  [provider] Taking Active            Med Note Iona Beard, STEPHANIE A   Mon Aug 02, 2020  2:55 PM)     colchicine 0.6 MG tablet [id number] Yes  Take 0.6 mg by mouth as needed. [provider] Taking Active   furosemide (LASIX) 20 MG tablet [id number] Yes TAKE 1 TABLET BY MOUTH EVERY DAY Glendale Chard, MD Taking Active   hydrALAZINE (APRESOLINE) 25 MG tablet [id number] Yes TAKE 1 TABLET BY MOUTH TWICE A Lynnell Dike, MD Taking Active   magnesium oxide (MAG-OX) 400 MG tablet [id number] Yes Take 400 mg by mouth daily. [provider] Taking Active   metoprolol succinate (TOPROL-XL) 50 MG 24 hr tablet [id number] Yes TAKE 1 TABLET (50 MG TOTAL) BY MOUTH DAILY. TAKE WITH OR IMMEDIATELY FOLLOWING A MEAL. Glendale Chard, MD Taking Active   Multiple Vitamin (MULTIVITAMIN+) LIQD [id number] Yes Take by mouth. [provider] Taking Active   olmesartan (BENICAR) 40 MG tablet [id number] Yes TAKE 1 TABLET BY MOUTH EVERY DAY Glendale Chard, MD Taking Active   Omega-3 Fatty Acids (OMEGA 3 PO) [id number] Yes Take 2,500 mg by mouth daily. 2 tablets by mouth daily [provider] Taking Active   potassium chloride (MICRO-K) 10 MEQ CR capsule [id number] Yes TAKE 1 CAPSULE BY MOUTH EVERY DAY Glendale Chard, MD Taking Active   Semaglutide [facility]) 3 MG TABS [id number] Yes Take one tablet by mouth 67mns before breakfast with 4oz water. SGlendale Chard MD Taking Active   TURMERIC CURCUMIN PO [id number] Take by mouth daily. [provider] Taking Active   Zinc 50 MG TABS [id number] Take 1 tablet by mouth daily. [provider] Taking Active Self            Patient Active Problem List   Diagnosis Date Noted   History of wheezing 03/18/2020   Osteopenia of multiple sites 10/30/2019   Prediabetes 05/14/2018   Essential hypertension, benign 05/14/2018   Class 3 severe obesity due to excess calories with serious comorbidity and body mass index (BMI) of 40.0 to 44.9 in adult ([place] 05/14/2018   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 tophus 07/20/2016    Primary osteoarthritis of both hands 07/20/2016   Primary osteoarthritis of both knees 07/20/2016   Primary osteoarthritis of both feet 07/20/2016   DJD (degenerative joint disease), cervical 07/20/2016   Elevated CK 07/20/2016   History of humerus fracture 07/20/2016   History of hypertension 07/20/2016   History of diabetes mellitus 07/20/2016   History of hyperlipidemia 07/20/2016   Chronic rhinitis 10/20/2015   Anosmia 10/20/2015   Mild intermittent asthma 10/20/2015    Immunization History  Administered Date(s) Administered   Influenza, High Dose Seasonal PF 02/05/2017, 02/12/2018, 12/16/2018, 01/13/2020   Influenza,inj,quad, With Preservative 02/13/2017, 02/01/2018   Influenza-Unspecified 02/03/2015, 01/18/2018, 01/29/2018, 12/16/2018   PFIZER(Purple Top)SARS-COV-2 Vaccination 01/01/2019, 01/23/2019, 08/19/2019   Pneumococcal Conjugate-13 01/20/2014   Pneumococcal-Unspecified 02/14/2017   Zoster Recombinat (Shingrix) 10/18/2019, 03/05/2020    Conditions to be addressed/monitored:  Hypertension, Hyperlipidemia, Gout, and Pre-Diabetes  Care Plan : [place] Updates made by PMayford Knife RConwaysince 10/20/2020 12:00 AM     Problem: HTN, HLD, Pre-Diabetes, Gout   Priority: High     Long-Range Goal: Disease Management   Start Date: 10/20/2020  This Visit's Progress: On track  Priority: High  Note:    Current Barriers:  Unable to independently monitor therapeutic efficacy Unable to achieve control of hypertension and hyperlipidemia    Pharmacist Clinical Goal(s):  Patient will achieve adherence to monitoring guidelines and medication adherence to achieve therapeutic efficacy through collaboration with PharmD and provider.   Interventions: 1:1 collaboration with SGlendale Chard MD regarding  development and update of comprehensive plan of care as evidenced by provider attestation and co-signature Inter-disciplinary care team collaboration (see  longitudinal plan of care) Comprehensive medication review performed; medication list updated in electronic medical record  Hypertension (BP goal <130/80) -Controlled -Current treatment: Amlodipine 10 mg tablet once per day Increased from 5 mg Hydralazine 25 mg tablet twice per day Per patient cardiologist might take her off  Metoprolol 50 mg taking 1 tablet by mouth daily Olmesartan 40 mg tablet once per day  -Current home readings: checking her BP at home, 134-140/87-85 to 146/91 when she started tracking her BP on 09/12/2020, around 6/10 - 128/80, 122/80, 120/68, 132/85, 120/74 -Current dietary habits: she enjoys salt and eats a lot potato chips -Current exercise habits: she has a trainer that she works with twice per week, she is in water aerobics three times per week, -Her cardiologist increased her Amlodipine from 5 mg to 10 mg, she reports that she had increased swelling in her ankles, the swelling comes down at night, and she also has compression socks on when she is home.  -Patient reports using an alarm on her phone to remind her to take Hydralazine  -She is also working on elevating her feet for at least 30 minutes a day  -Denies hypotensive/hypertensive symptoms -Educated on Exercise goal of 150 minutes per week; Importance of home blood pressure monitoring; Proper BP monitoring technique; -Counseled to monitor BP at home at least once per day, document, and provide log at future appointments -Recommended to continue current medication  Hyperlipidemia: (LDL goal < 70) -Uncontrolled -Current treatment: None at this time  At this time you are going to follow up in September as see how her cholesterol is doing.  -Medications previously tried: none at this time  -Current dietary patterns: she no longer eats beef, eats plenty of vegetables, she does not eat a lot of fried foods, she does eat potato chips, she eats plenty of roasted vegetables, and uses a cast iron  skillet -During the pandemic she was testing different recipes, she was eating a lot of butter based recipes , she also reports that she was on a bacon eating binge, she also increased the amount of cheese she was eating.  -Current exercise habits: she has a trainer that she works with twice per week, she is in water aerobics three times per week, -She is concerned about statins impacting her joints -Patient is going to continue rigorous lifestyle changes, and try to bring down her LDL with these changes  -Educated on Cholesterol goals;  Benefits of statin for ASCVD risk reduction; Importance of limiting foods high in cholesterol; Exercise goal of 150 minutes per week; Strategies to manage statin-induced myalgias; -Counseled on diet and exercise extensively   Pre-Diabetes (A1c goal <5.7) -Controlled -Current medications: Rybelsus 3 mg tablet once per day -Denies hypoglycemic/hyperglycemic symptoms -Current meal patterns:  -Current exercise habits: she has a trainer that she works with twice per week, she is in water aerobics three times per week -Educated on Exercise goal of 150 minutes per week; Benefits of weight loss; -Counseled to check feet daily and get yearly eye exams -Recommended to continue current medication Assessed patient finances. She would like to apply for patient assistance for Rybelsus   Gout (Goal: Prevent flare ups) -Controlled -Current treatment  Allopurinol 300 mg tablet daily  Colcrys 0.6 mg tablet as needed for flare up -Recommended to continue current medication    Health Maintenance -Vaccine gaps:   COVID -19  Booster : has already received her second booster   -Current therapy:  Calcium Vitamin 600 mg - taking 1 tablet daily Vitamin D 500 mg - taking 1 tablet daily Benfotiamine Po- taking daily Magnesium Oxide 400 mg tablet once daily Zinc 50 mg taking 1 tablet daily Multivitamin - taking gummies   -Educated on Supplements may interfere with  prescription drugs -Patient is going to review her current vitamins and make sure she  -Patient is satisfied with current therapy and denies issues -Recommended to continue current medication  Patient Goals/Self-Care Activities Patient will:  - take medications as prescribed  Follow Up Plan: The patient has been provided with contact information for the care management team and has been advised to call with any health related questions or concerns.        Medication Assistance: None required.  Patient affirms current coverage meets needs.  Compliance/Adherence/Medication fill history: Care Gaps: Foot Exam Tetanus/TDAP Opthamology Exam  Star-Rating Drugs: Rybelsus 3 mg tablet once per day Olmesartan 40 mg tablet once per day   Patient's [facility] pharmacy is:  [id number] [place], [place] - [id number]: [phone]: [phone] [id number] [place]: [phone]: [phone] pill box? Yes Pt endorses 95% compliance  We discussed: Benefits of medication synchronization, packaging and delivery as well as enhanced pharmacist oversight with [employer]. Patient decided to: Continue current medication management strategy  Care Plan and Follow Up Patient Decision:  Patient agrees to Care Plan and Follow-up.  Plan: The patient has been provided with contact information for the care management team and has been advised to call with any health related questions or concerns.   VaOrlando PennerPharmD Clinical Pharmacist [facility] [phone]   Current Barriers:  Unable to independently monitor therapeutic efficacy Unable to achieve control of hypertension and hyperlipidemia    Pharmacist Clinical Goal(s):  Patient will achieve adherence to monitoring  guidelines and medication adherence to achieve therapeutic efficacy through collaboration with PharmD and provider.   Interventions: 1:1 collaboration with SaGlendale ChardMD regarding development and update of comprehensive plan of care as evidenced by provider attestation and co-signature Inter-disciplinary care team collaboration (see longitudinal plan of care) Comprehensive medication review performed; medication list updated in electronic medical record  Hypertension (BP goal <130/80) -Controlled -Current treatment: Amlodipine 10 mg tablet once per day Increased from 5 mg Hydralazine 25 mg tablet twice per day Per patient cardiologist might take her off  Metoprolol 50 mg taking 1 tablet by mouth daily Olmesartan 40 mg tablet once per day  -Current home readings: checking her BP at home, 134-140/87-85 to 146/91 when she started tracking her BP on 09/12/2020, around 6/10 - 128/80, 122/80, 120/68, 132/85, 120/74 -Current dietary habits: she enjoys salt and eats a lot potato chips -Current exercise habits: she has a trainer that she works with twice per week, she is in water aerobics three times per week, -Her cardiologist increased her Amlodipine from 5 mg to 10 mg, she reports that she had increased swelling in her ankles, the swelling comes down at night, and she also has compression socks on when she is home.  -She is also working on elevating her feet for at least 30 minutes a day  -Denies hypotensive/hypertensive symptoms -Educated on Exercise goal of 150 minutes per week; Importance of  home blood pressure monitoring; Proper BP monitoring technique; -Counseled to monitor BP at home at least once per day, document, and provide log at future appointments -Recommended to continue current medication  Hyperlipidemia: (LDL goal < 70) -Uncontrolled -Current treatment: None at this time  At this time you are going to follow up in September as see how her cholesterol is doing.   -Medications previously tried: none at this time  -Current dietary patterns: she no longer eats beef, eats plenty of vegetables, she does not eat a lot of fried foods, she does eat potato chips, she eats plenty of roasted vegetables, and uses a cast iron skillet -During the pandemic she was testing different recipes, she was eating a lot of butter based recipes , she also reports that she was on a bacon eating binge, she also increased the amount of cheese she was eating.  -Current exercise habits: she has a trainer that she works with twice per week, she is in water aerobics three times per week, -She is concerned about statins impacting her joints -Educated on Cholesterol goals;  Benefits of statin for ASCVD risk reduction; Importance of limiting foods high in cholesterol; Exercise goal of 150 minutes per week; Strategies to manage statin-induced myalgias; -Counseled on diet and exercise extensively Recommended to continue current medication  Pre-Diabetes (A1c goal <5.7) -Controlled -Current medications: Rybelsus 3 mg tablet once per day -Denies hypoglycemic/hyperglycemic symptoms -Current meal patterns:  -Current exercise habits: she has a trainer that she works with twice per week, she is in water aerobics three times per week -Educated on Exercise goal of 150 minutes per week; Benefits of weight loss; -Counseled to check feet daily and get yearly eye exams -Recommended to continue current medication Assessed patient finances. She would like to apply for patient assistance for Rybelsus   Gout (Goal: Prevent flare ups) -Controlled -Current treatment  Allopurinol 300 mg tablet daily  Colcrys 0.6 mg tablet as needed for flare up -Recommended to continue current medication    Health Maintenance -Vaccine gaps:   COVID -19 Booster : has already received her second booster   -Current therapy:  Calcium Vitamin 600 mg - taking 1 tablet daily Vitamin D 500 mg - taking 1 tablet  daily Benfotiamine Po- taking daily Magnesium Oxide 400 mg tablet once daily Zinc 50 mg taking 1 tablet daily Multivitamin - taking gummies   -Educated on Supplements may interfere with prescription drugs -Patient is going to review her current vitamins and make sure she  -Patient is satisfied with current therapy and denies issues -Recommended to continue current medication  Patient Goals/Self-Care Activities Patient will:  - take medications as prescribed  Follow Up Plan: The patient has been provided with contact information for the care management team and has been advised to call with any health related questions or concerns.

## 2020-10-22 ENCOUNTER — Telehealth: Payer: Self-pay

## 2020-10-22 NOTE — Chronic Care Management (AMB) (Signed)
    Chronic Care Management Pharmacy Assistant   Name: Adisyn Ruscitti  MRN: [id number] DOB: [birth year]  Reason for Encounter: Patient Assistance Coordination  10/22/2020- Patient assistance application filled out for Rybelsus 3 mg with [place] patient to inform application was ready for signature, patient notified by PharmD of income documentation needed. Patient aware and confirmed coming to office on 08/14/6061 to sign application. Application printed, Orlando Penner, CPP notified.    Medications: Outpatient Encounter Medications as of 10/22/2020  Medication Sig   allopurinol (ZYLOPRIM) 300 MG tablet TAKE 1 TABLET BY MOUTH EVERY DAY   amLODipine (NORVASC) 10 MG tablet Take 1 tablet (10 mg total) by mouth daily.   azelastine (ASTELIN) 0.1 % nasal spray 1-2 sprays each nostril twice daily as needed   B Complex Vitamins (VITAMIN-B COMPLEX) TABS Take by mouth.   BENFOTIAMINE PO Take by mouth 2 (two) times daily.   Calcium Carbonate-Vit D-Min (CALCIUM 1200 PO) Take 600 mg by mouth daily.    Cholecalciferol (VITAMIN D3) 125 MCG (5000 UT) CAPS Take by mouth.    colchicine 0.6 MG tablet Take 0.6 mg by mouth as needed.   furosemide (LASIX) 20 MG tablet TAKE 1 TABLET BY MOUTH EVERY DAY   hydrALAZINE (APRESOLINE) 25 MG tablet TAKE 1 TABLET BY MOUTH TWICE A DAY   magnesium oxide (MAG-OX) 400 MG tablet Take 400 mg by mouth daily.   metoprolol succinate (TOPROL-XL) 50 MG 24 hr tablet TAKE 1 TABLET (50 MG TOTAL) BY MOUTH DAILY. TAKE WITH OR IMMEDIATELY FOLLOWING A MEAL.   Multiple Vitamin (MULTIVITAMIN+) LIQD Take by mouth.   olmesartan (BENICAR) 40 MG tablet TAKE 1 TABLET BY MOUTH EVERY DAY   Omega-3 Fatty Acids (OMEGA 3 PO) Take 2,500 mg by mouth daily. 2 tablets by mouth daily   potassium chloride (MICRO-K) 10 MEQ CR capsule TAKE 1 CAPSULE BY MOUTH EVERY DAY   Semaglutide (RYBELSUS) 3 MG TABS Take one tablet by mouth 15mins before breakfast with 4oz water.    TURMERIC CURCUMIN PO Take by mouth daily.   Zinc 50 MG TABS Take 1 tablet by mouth daily.   No facility-administered encounter medications on file as of 10/22/2020.    Star Rating Drugs: Olmesartan 40 mg- Last filled 10-12-2020 90 [facility] Rybelsus 3 mg- Last filled 10-08-2020 30 [facility]   SIG: Pattricia Boss, [place] Pharmacist Assistant [phone]

## 2020-10-26 ENCOUNTER — Encounter: Payer: Self-pay | Admitting: Internal Medicine

## 2020-10-27 DIAGNOSIS — M9901 Segmental and somatic dysfunction of cervical region: Secondary | ICD-10-CM | POA: Diagnosis not present

## 2020-10-27 DIAGNOSIS — M5442 Lumbago with sciatica, left side: Secondary | ICD-10-CM | POA: Diagnosis not present

## 2020-10-27 DIAGNOSIS — M9902 Segmental and somatic dysfunction of thoracic region: Secondary | ICD-10-CM | POA: Diagnosis not present

## 2020-10-27 DIAGNOSIS — M9903 Segmental and somatic dysfunction of lumbar region: Secondary | ICD-10-CM | POA: Diagnosis not present

## 2020-11-02 ENCOUNTER — Other Ambulatory Visit: Payer: Self-pay

## 2020-11-02 DIAGNOSIS — M722 Plantar fascial fibromatosis: Secondary | ICD-10-CM | POA: Diagnosis not present

## 2020-11-02 DIAGNOSIS — M79671 Pain in right foot: Secondary | ICD-10-CM | POA: Diagnosis not present

## 2020-11-02 DIAGNOSIS — M7742 Metatarsalgia, left foot: Secondary | ICD-10-CM | POA: Diagnosis not present

## 2020-11-02 DIAGNOSIS — M21612 Bunion of left foot: Secondary | ICD-10-CM | POA: Diagnosis not present

## 2020-11-02 DIAGNOSIS — M79672 Pain in left foot: Secondary | ICD-10-CM | POA: Diagnosis not present

## 2020-11-02 DIAGNOSIS — M7741 Metatarsalgia, right foot: Secondary | ICD-10-CM | POA: Diagnosis not present

## 2020-11-02 DIAGNOSIS — M21611 Bunion of right foot: Secondary | ICD-10-CM | POA: Insufficient documentation

## 2020-11-02 MED ORDER — AMLODIPINE BESYLATE 10 MG PO TABS: 10.0000 mg | ORAL_TABLET | Freq: Every day | ORAL | 1 refills | Status: DC

## 2020-11-03 DIAGNOSIS — M9901 Segmental and somatic dysfunction of cervical region: Secondary | ICD-10-CM | POA: Diagnosis not present

## 2020-11-03 DIAGNOSIS — M9902 Segmental and somatic dysfunction of thoracic region: Secondary | ICD-10-CM | POA: Diagnosis not present

## 2020-11-03 DIAGNOSIS — M9903 Segmental and somatic dysfunction of lumbar region: Secondary | ICD-10-CM | POA: Diagnosis not present

## 2020-11-03 DIAGNOSIS — M5442 Lumbago with sciatica, left side: Secondary | ICD-10-CM | POA: Diagnosis not present

## 2020-11-03 NOTE — Progress Notes (Signed)
Office Visit Note  Patient: Robin Odom             Date of Birth: 07-25-1950           MRN: [id number]             PCP: Glendale Chard, MD Referring: Glendale Chard, MD Visit Date: 11/17/2020 Occupation: @GUAROCC @  Subjective:  Medication management.   History of Present Illness: Robin Odom is a 70 y.o. female with history of osteoarthritis and gout.  She has not had any gout flare since the last visit.  She does not recall when her last gout flare was.  She continues to have some stiffness in her hands, her knees and her feet.  She has not noticed any increased joint swelling.  She has been taking allopurinol 300 mg daily.  Activities of Daily Living:  Patient reports morning stiffness for all day. Patient Denies nocturnal pain.  Difficulty dressing/grooming: Denies Difficulty climbing stairs: Reports Difficulty getting out of chair: Denies Difficulty using hands for taps, buttons, cutlery, and/or writing: Denies  Review of Systems  Constitutional:  Negative for fatigue.  HENT:  Negative for mouth sores, mouth dryness and nose dryness.   Eyes:  Negative for pain, itching and dryness.  Respiratory:  Negative for shortness of breath and difficulty breathing.   Cardiovascular:  Negative for chest pain and palpitations.  Gastrointestinal:  Negative for blood in stool, constipation and diarrhea.  Endocrine: Negative for increased urination.  Genitourinary:  Negative for difficulty urinating.  Musculoskeletal:  Positive for joint pain, joint pain, myalgias, morning stiffness, muscle tenderness and myalgias. Negative for joint swelling.  Skin:  Positive for rash. Negative for color change.  Allergic/Immunologic: Negative for susceptible to infections.  Neurological:  Negative for dizziness, numbness, headaches, memory loss and weakness.  Hematological:  Negative for bruising/bleeding tendency.  Psychiatric/Behavioral:  Negative for confusion.    PMFS History:  Patient Active  Problem List   Diagnosis Date Noted   History of wheezing 03/18/2020   Osteopenia of multiple sites 10/30/2019   Prediabetes 05/14/2018   Essential hypertension, benign 05/14/2018   Class 3 severe obesity due to excess calories with serious comorbidity and body mass index (BMI) of 40.0 to 44.9 in adult ([place]) 05/14/2018   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 tophus 07/20/2016   Primary osteoarthritis of both hands 07/20/2016   Primary osteoarthritis of both knees 07/20/2016   Primary osteoarthritis of both feet 07/20/2016   DJD (degenerative joint disease), cervical 07/20/2016   Elevated CK 07/20/2016   History of humerus fracture 07/20/2016   History of hypertension 07/20/2016   History of diabetes mellitus 07/20/2016   History of hyperlipidemia 07/20/2016   Chronic rhinitis 10/20/2015   Anosmia 10/20/2015   Mild intermittent asthma 10/20/2015    Past Medical History:  Diagnosis Date   Arthritis    Diabetes mellitus without complication ([place])    Gout    Hypertension    Partial retinal tear of both eyes without detachment 08/29/2017    Family History  Problem Relation Age of Onset   Heart disease Mother    Hypertension Mother    Diabetes Mother    Arthritis Father    Hypertension Father    Prostate cancer Father    Parkinson's disease Father    Heart disease Father    Sinusitis Sister    Allergic rhinitis Sister    Diabetes Sister    High Cholesterol Sister    Hypertension Sister    Diabetes  Brother    Hypertension Brother    High Cholesterol Brother    Hypertension Brother    Angioedema Neg Hx    Asthma Neg Hx    Eczema Neg Hx    Immunodeficiency Neg Hx    Urticaria Neg Hx    Breast cancer Neg Hx    Past Surgical History:  Procedure Laterality Date   ABDOMINAL HYSTERECTOMY  1991   CARPAL TUNNEL RELEASE Right 2019   COLONOSCOPY     5 between 1994-2010   Left knee surgery  2006   ROTATOR CUFF REPAIR Left 2001   ROTATOR CUFF REPAIR Right 2003    SHOULDER SURGERY  2001/2003   TONSILECTOMY/ADENOIDECTOMY WITH MYRINGOTOMY  1971   Social History   Social History Narrative   Not on file   Immunization History  Administered Date(s) Administered   Influenza, High Dose Seasonal PF 02/05/2017, 02/12/2018, 12/16/2018, 01/13/2020   Influenza,inj,quad, With Preservative 02/13/2017, 02/01/2018   Influenza-Unspecified 02/03/2015, 01/18/2018, 01/29/2018, 12/16/2018   PFIZER(Purple Top)SARS-COV-2 Vaccination 01/01/2019, 01/23/2019, 08/19/2019, 08/13/2020   Pneumococcal Conjugate-13 01/20/2014   Pneumococcal-Unspecified 02/14/2017   Zoster Recombinat (Shingrix) 10/18/2019, 03/05/2020     Objective: Vital Signs: BP 122/81 (BP Location: Left Arm, Patient Position: Sitting, Cuff Size: Normal)   Pulse 62   Ht 5\' 5"  (1.651 m)   Wt 236 lb (107 kg)   BMI 39.27 kg/m    Physical Exam Vitals and nursing note reviewed.  Constitutional:      Appearance: She is well-developed.  HENT:     Head: Normocephalic and atraumatic.  Eyes:     Conjunctiva/sclera: Conjunctivae normal.  Cardiovascular:     Rate and Rhythm: Normal rate and regular rhythm.     Heart sounds: Normal heart sounds.  Pulmonary:     Effort: Pulmonary effort is normal.     Breath sounds: Normal breath sounds.  Abdominal:     General: Bowel sounds are normal.     Palpations: Abdomen is soft.  Musculoskeletal:     Cervical back: Normal range of motion.  Lymphadenopathy:     Cervical: No cervical adenopathy.  Skin:    General: Skin is warm and dry.     Capillary Refill: Capillary refill takes less than 2 seconds.  Neurological:     Mental Status: She is alert and oriented to person, place, and time.  Psychiatric:        Behavior: Behavior normal.     Musculoskeletal Exam: C-spine was in good range of motion.  Shoulder joints, elbow joints, wrist joints with good range of motion.  She had DIP and PIP thickening but no synovitis.  Hip joints in good range of motion.  She  had limited knee joint extension but no warmth swelling or effusion was noted.  She had no tenderness over ankles or MTPs.  Osteoarthritic changes were noted in her feet DIP and PIP thickening.  CDAI Exam: CDAI Score: -- Patient Global: --; Provider Global: -- Swollen: --; Tender: -- Joint Exam 11/17/2020   No joint exam has been documented for this visit   There is currently no information documented on the homunculus. Go to the Rheumatology activity and complete the homunculus joint exam.  Investigation: No additional findings.  Imaging: No results found.  Recent Labs: Lab Results  Component Value Date   WBC 10.5 07/08/2020   HGB 13.0 07/08/2020   PLT 340 07/08/2020   NA 142 07/08/2020   K 4.0 07/08/2020   CL 103 07/08/2020   CO2 24 07/08/2020  GLUCOSE 86 07/08/2020   BUN 15 07/08/2020   CREATININE 0.86 07/08/2020   BILITOT 0.3 07/08/2020   ALKPHOS 71 07/08/2020   AST 21 07/08/2020   ALT 16 07/08/2020   PROT 7.0 07/08/2020   ALBUMIN 4.4 07/08/2020   CALCIUM 9.8 07/08/2020   GFRAA 77 05/13/2020    Speciality Comments: No specialty comments available.  Procedures:  No procedures performed Allergies: Codeine and Penicillins   Assessment / Plan:     Visit Diagnoses: 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 tophus - allopurinol 300 mg 1 tablet by mouth daily.  She denies having a gout flare since the last visit.  She does not recall the last time she had a gout flare.  Medication monitoring encounter - Uric acid: 07/08/2020 4.1.  Her uric acid is in desirable range.  She will get repeat CBC with differential, CMP with GFR and uric acid in September.  She plans to get labs for Dr. Baird Cancer office.  Primary osteoarthritis of both hands-she continues to have some stiffness in her hands but no swelling was noted.  Primary osteoarthritis of both knees-she has limited extension but no warmth swelling or effusion was noted.  Primary osteoarthritis of both  feet-proper fitting shoes were discussed.  She had no synovitis on examination.  DDD (degenerative disc disease), cervical-she had good range of motion without discomfort.  Osteopenia of multiple sites - 10/29/2019 bone density report from [place] showed T score of -2.1 in the left femoral neck consistent with osteopenia.    Other medical problems are listed as follows:  History of diabetes mellitus  History of hypertension  History of hyperlipidemia-increased risk of heart disease with gout was discussed.  Dietary modifications and exercise was emphasized.  Handout was placed in the AVS.  History of humerus fracture  Orders: No orders of the defined types were placed in this encounter.  No orders of the defined types were placed in this encounter.   Follow-Up Instructions: Return in about 6 months (around 05/20/2021).   Bo Merino, MD  Note - This record has been created using Editor, commissioning.  Chart creation errors have been sought, but may not always  have been located. Such creation errors do not reflect on  the standard of medical care.

## 2020-11-08 ENCOUNTER — Encounter (HOSPITAL_BASED_OUTPATIENT_CLINIC_OR_DEPARTMENT_OTHER): Payer: Self-pay

## 2020-11-10 ENCOUNTER — Telehealth: Payer: Self-pay

## 2020-11-10 NOTE — Telephone Encounter (Signed)
Called patient to inform her that her patient assistance was not approved due to income. Patient voiced understanding.   Orlando Penner, PharmD Clinical Pharmacist [facility] [phone]  Total Time 5 minutes

## 2020-11-17 ENCOUNTER — Other Ambulatory Visit: Payer: Self-pay

## 2020-11-17 ENCOUNTER — Ambulatory Visit: Payer: Medicare HMO | Admitting: Rheumatology

## 2020-11-17 ENCOUNTER — Encounter: Payer: Self-pay | Admitting: Rheumatology

## 2020-11-17 VITALS — BP 122/81 | HR 62 | Ht 65.0 in | Wt 236.0 lb

## 2020-11-17 DIAGNOSIS — M5442 Lumbago with sciatica, left side: Secondary | ICD-10-CM | POA: Diagnosis not present

## 2020-11-17 DIAGNOSIS — M9901 Segmental and somatic dysfunction of cervical region: Secondary | ICD-10-CM | POA: Diagnosis not present

## 2020-11-17 DIAGNOSIS — Z8679 Personal history of other diseases of the circulatory system: Secondary | ICD-10-CM | POA: Diagnosis not present

## 2020-11-17 DIAGNOSIS — M9903 Segmental and somatic dysfunction of lumbar region: Secondary | ICD-10-CM | POA: Diagnosis not present

## 2020-11-17 DIAGNOSIS — M19042 Primary osteoarthritis, left hand: Secondary | ICD-10-CM

## 2020-11-17 DIAGNOSIS — Z8781 Personal history of (healed) traumatic fracture: Secondary | ICD-10-CM

## 2020-11-17 DIAGNOSIS — Z5181 Encounter for therapeutic drug level monitoring: Secondary | ICD-10-CM | POA: Diagnosis not present

## 2020-11-17 DIAGNOSIS — M19071 Primary osteoarthritis, right ankle and foot: Secondary | ICD-10-CM

## 2020-11-17 DIAGNOSIS — M170 Bilateral primary osteoarthritis of knee: Secondary | ICD-10-CM

## 2020-11-17 DIAGNOSIS — M5030 Other cervical disc degeneration, unspecified cervical region: Secondary | ICD-10-CM

## 2020-11-17 DIAGNOSIS — M1A09X0 Idiopathic chronic gout, multiple sites, without tophus (tophi): Secondary | ICD-10-CM

## 2020-11-17 DIAGNOSIS — Z8639 Personal history of other endocrine, nutritional and metabolic disease: Secondary | ICD-10-CM | POA: Diagnosis not present

## 2020-11-17 DIAGNOSIS — M8589 Other specified disorders of bone density and structure, multiple sites: Secondary | ICD-10-CM

## 2020-11-17 DIAGNOSIS — M19041 Primary osteoarthritis, right hand: Secondary | ICD-10-CM | POA: Diagnosis not present

## 2020-11-17 DIAGNOSIS — M9902 Segmental and somatic dysfunction of thoracic region: Secondary | ICD-10-CM | POA: Diagnosis not present

## 2020-11-17 DIAGNOSIS — M19072 Primary osteoarthritis, left ankle and foot: Secondary | ICD-10-CM

## 2020-11-17 NOTE — Patient Instructions (Signed)
Heart Disease Prevention   Your inflammatory disease increases your risk of heart disease which includes heart attack, stroke, atrial fibrillation (irregular heartbeats), high blood pressure, heart failure and atherosclerosis (plaque in the arteries).  It is important to reduce your risk by:   Keep blood pressure, cholesterol, and blood sugar at healthy levels   Smoking Cessation   Maintain a healthy weight  BMI 20-25   Eat a healthy diet  Plenty of fresh fruit, vegetables, and whole grains  Limit saturated fats, foods high in sodium, and added sugars  DASH and Mediterranean diet   Increase physical activity  Recommend moderate physically activity for 150 minutes per week/ 30 minutes a day for five days a week These can be broken up into three separate ten-minute sessions during the day.   Reduce Stress  Meditation, slow breathing exercises, yoga, coloring books  Dental visits twice a year   

## 2020-11-18 ENCOUNTER — Ambulatory Visit (INDEPENDENT_AMBULATORY_CARE_PROVIDER_SITE_OTHER): Payer: Medicare HMO

## 2020-11-18 ENCOUNTER — Telehealth: Payer: Medicare HMO

## 2020-11-18 DIAGNOSIS — M19041 Primary osteoarthritis, right hand: Secondary | ICD-10-CM | POA: Diagnosis not present

## 2020-11-18 DIAGNOSIS — R7303 Prediabetes: Secondary | ICD-10-CM

## 2020-11-18 DIAGNOSIS — H348322 Tributary (branch) retinal vein occlusion, left eye, stable: Secondary | ICD-10-CM | POA: Diagnosis not present

## 2020-11-18 DIAGNOSIS — H43813 Vitreous degeneration, bilateral: Secondary | ICD-10-CM | POA: Diagnosis not present

## 2020-11-18 DIAGNOSIS — E7800 Pure hypercholesterolemia, unspecified: Secondary | ICD-10-CM | POA: Diagnosis not present

## 2020-11-18 DIAGNOSIS — M1A09X0 Idiopathic chronic gout, multiple sites, without tophus (tophi): Secondary | ICD-10-CM

## 2020-11-18 DIAGNOSIS — I10 Essential (primary) hypertension: Secondary | ICD-10-CM

## 2020-11-18 DIAGNOSIS — M19042 Primary osteoarthritis, left hand: Secondary | ICD-10-CM

## 2020-11-18 DIAGNOSIS — H35033 Hypertensive retinopathy, bilateral: Secondary | ICD-10-CM | POA: Diagnosis not present

## 2020-11-18 NOTE — Patient Instructions (Signed)
Patient Care Plan: Prediabetes     Problem Identified: Disease Progression (Prediabetes)   Priority: Medium     Long-Range Goal: Disease Progression Prevented or Minimized   Start Date: 11/18/2020  Expected End Date: 11/18/2021  This Visit's Progress: On track  Priority: Medium  Note:   Objective:  Lab Results  Component Value Date   HGBA1C 5.9 (H) 09/09/2020   Lab Results  Component Value Date   CREATININE 0.86 07/08/2020   CREATININE 0.88 05/13/2020   CREATININE 0.87 02/04/2020   Lab Results  Component Value Date   EGFR 73 07/08/2020   Current Barriers:  Knowledge Deficits related to basic Diabetes pathophysiology and self care/management Knowledge Deficits related to medications used for management of diabetes Case Manager Clinical Goal(s):  patient will demonstrate improved adherence to prescribed treatment plan for diabetes self care/management as evidenced by: adherence to ADA/ carb modified diet exercise 5 days/week adherence to prescribed medication regimen contacting provider for new or worsened symptoms or questions Interventions:  11/18/20 completed successful outbound call with patient  Collaboration with Glendale Chard, MD regarding development and update of comprehensive plan of care as evidenced by provider attestation and co-signature Inter-disciplinary care team collaboration (see longitudinal plan of care) Provided education to patient about basic DM disease process Review of patient status, including review of consultant's reports, relevant laboratory and other test results, and medications completed. Reviewed medications with patient and discussed importance of medication adherence Mailed printed educational materials to patient's home address related to Prediabetes management Discussed plans with patient for ongoing care management follow up and provided patient with direct contact information for care management team Self-Care Activities - adhere to  ADA/carb modified diet - exercise 5 days/week - adherence to prescribed medication regimen - contact provider for new or worsening symptoms or questions Patient Goals: - drink 6 to 8 glasses of water each day - manage portion size  Follow Up Plan: Telephone follow up appointment with care management team member scheduled for: 01/12/21    Patient Care Plan: Hypertension (Adult)     Problem Identified: Hypertension (Hypertension)   Priority: High     Long-Range Goal: Hypertension Monitored   Start Date: 11/18/2020  Expected End Date: 11/18/2021  This Visit's Progress: On track  Priority: High  Note:   Objective:  Last practice recorded BP readings:  BP Readings from Last 3 Encounters:  11/17/20 122/81  09/29/20 (!) 142/72  09/09/20 132/84   Most recent eGFR/CrCl:  Lab Results  Component Value Date   EGFR 73 07/08/2020    No components found for: CRCL Current Barriers:  Knowledge Deficits related to basic understanding of hypertension pathophysiology and self care management Knowledge Deficits related to understanding of medications prescribed for management of hypertension Case Manager Clinical Goal(s):  patient will demonstrate improved adherence to prescribed treatment plan for hypertension as evidenced by taking all medications as prescribed, monitoring and recording blood pressure as directed, adhering to low sodium/DASH diet Interventions:  11/18/20 completed successful outbound call with patient  Collaboration with Glendale Chard, MD regarding development and update of comprehensive plan of care as evidenced by provider attestation and co-signature Inter-disciplinary care team collaboration (see longitudinal plan of care) Evaluation of current treatment plan related to hypertension self management and patient's adherence to plan as established by provider. Provided education to patient re: stroke prevention, s/s of heart attack and stroke, DASH diet, complications of  uncontrolled blood pressure Reviewed medications with patient and discussed importance of compliance Advised patient, providing education and rationale,  to monitor blood pressure daily and record, calling PCP for findings outside established parameters.  Reviewed scheduled/upcoming provider appointments including: next Cardiology follow up appointment with Dr. Harrell Gave scheduled for 12/30/20 Mailed printed educational materials related to What is High Blood Pressure; Why Should I Restrict Sodium? Discussed plans with patient for ongoing care management follow up and provided patient with direct contact information for care management team Self-Care Activities: Self administers medications as prescribed Attends all scheduled provider appointments Calls provider office for new concerns, questions, or BP outside discussed parameters Checks BP and records as discussed Follows a low sodium diet/DASH diet Patient Goals: - check blood pressure daily - choose a place to take my blood pressure (home, clinic or office, retail store) - write blood pressure results in a log or diary - learn about high blood pressure  Follow Up Plan: Telephone follow up appointment with care management team member scheduled for: 01/12/21     Patient Care Plan: Osteoarthritis (Adult)     Problem Identified: Mobility and Function (Osteoarthritis/Gout)   Priority: High     Long-Range Goal: Maintain Mobility and Function   Start Date: 11/18/2020  Expected End Date: 11/18/2021  This Visit's Progress: On track  Priority: High  Note:   Current Barriers:  Ineffective Self Health Maintenance in a patient with  Osteoarthritis/Gout  Clinical Goal(s):  Collaboration with Glendale Chard, MD regarding development and update of comprehensive plan of care as evidenced by provider attestation and co-signature Inter-disciplinary care team collaboration (see longitudinal plan of care) patient will work with care management team  to address care coordination and chronic disease management needs related to Disease Management Educational Needs Care Coordination Medication Management and Education Psychosocial Support   Interventions:  11/18/20 completed successful outbound call with patient  Evaluation of current treatment plan related to  Osteoarthritis/Gout  , self-management and patient's adherence to plan as established by provider. Collaboration with Glendale Chard, MD regarding development and update of comprehensive plan of care as evidenced by provider attestation       and co-signature Inter-disciplinary care team collaboration (see longitudinal plan of care) Provided education to patient about basic disease process related to Osteoarthritis  Review of patient status, including review of consultant's reports, relevant laboratory and other test results, and medications completed. Reviewed medications with patient and discussed importance of medication adherence Discussed and reviewed the following Assessment/Plan per Dr. Estanislado Pandy from 11/17/20:    Assessment / Plan:     Visit Diagnoses: 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 tophus - allopurinol 300 mg 1 tablet by mouth daily.  She denies having a gout flare since the last visit.  She does not recall the last time she had a gout flare.   Medication monitoring encounter - Uric acid: 07/08/2020 4.1.  Her uric acid is in desirable range.  She will get repeat CBC with differential, CMP with GFR and uric acid in September.  She plans to get labs for Dr. Baird Cancer office.   Primary osteoarthritis of both hands-she continues to have some stiffness in her hands but no swelling was noted.   Primary osteoarthritis of both knees-she has limited extension but no warmth swelling or effusion was noted.   Primary osteoarthritis of both feet-proper fitting shoes were discussed.  She had no synovitis on examination.   DDD (degenerative disc disease), cervical-she had  good range of motion without discomfort.   Osteopenia of multiple sites - 10/29/2019 bone density report from [place] showed T score of -2.1  in the left femoral neck consistent with osteopenia.     Other medical problems are listed as follows:   History of diabetes mellitus   History of hypertension   History of hyperlipidemia-increased risk of heart disease with gout was discussed.  Dietary modifications and exercise was emphasized.  Handout was placed in the AVS.   History of humerus fracture  Follow-Up Instructions: Return in about 6 months (around 05/20/2021). Bo Merino, MD    Determined patient is adhering to her prescribed treatment plan and denies having questions at this time Determined patient is participating in a home exercise program including cardio and water exercises Discussed plans with patient for ongoing care management follow up and provided patient with direct contact information for care management team Self Care Activities:  Self administers medications as prescribed Attends all scheduled provider appointments Calls pharmacy for medication refills Calls provider office for new concerns or questions Patient Goals: - Maintain Mobility and Function  Follow Up Plan: Telephone follow up appointment with care management team member scheduled for: 01/12/21     Goals Addressed      Maintain Mobility and Function   On track    Timeframe:  Long-Range Goal Priority:  High Start Date:  11/18/20                           Expected End Date:  11/18/21  Next follow up date: 01/12/21  Patient Goals/Activities:  - Maintain Mobility and Function                         Monitor and Manage My Blood Sugar-Prediabetes   On track    Timeframe:  Long-Range Goal Priority:  Medium Start Date:  11/18/20                           Expected End Date: 11/18/21                      Follow Up Date 01/12/21    Self-Care Activities - adhere to ADA/carb modified  diet - exercise 5 days/week - adherence to prescribed medication regimen - contact provider for new or worsening symptoms or questions Patient Goals: - drink 6 to 8 glasses of water each day - manage portion size   Why is this important?   Checking your blood sugar at home helps to keep it from getting very high or very low.  Writing the results in a diary or log helps the doctor know how to care for you.  Your blood sugar log should have the time, date and the results.  Also, write down the amount of insulin or other medicine that you take.  Other information, like what you ate, exercise done and how you were feeling, will also be helpful.     Notes:      Track and Manage My Blood Pressure-Hypertension   On track    Timeframe:  Long-Range Goal Priority:  High Start Date:  11/18/20                           Expected End Date:  11/18/21                     Follow Up Date 01/12/21    - check blood pressure daily - choose a place to  take my blood pressure (home, clinic or office, retail store) - write blood pressure results in a log or diary    Why is this important?   You won't feel high blood pressure, but it can still hurt your blood vessels.  High blood pressure can cause heart or kidney problems. It can also cause a stroke.  Making lifestyle changes like losing a Kyliee Ortego weight or eating less salt will help.  Checking your blood pressure at home and at different times of the day can help to control blood pressure.  If the doctor prescribes medicine remember to take it the way the doctor ordered.  Call the office if you cannot afford the medicine or if there are questions about it.     Notes:

## 2020-11-18 NOTE — Chronic Care Management (AMB) (Signed)
Chronic Care Management   CCM RN Visit Note  11/18/2020 Name: Robin Odom MRN: [id number] DOB: 1950-05-06  Subjective: Robin Odom is a 70 y.o. year old female who is a primary care patient of Glendale Chard, MD. The care management team was consulted for assistance with disease management and care coordination needs.    Engaged with patient by telephone for initial visit in response to provider referral for case management and/or care coordination services.   Consent to Services:  The patient was given the following information about Chronic Care Management services today, agreed to services, and gave verbal consent: 1. CCM service includes personalized support from designated clinical staff supervised by the primary care provider, including individualized plan of care and coordination with other care providers 2. 24/7 contact phone numbers for assistance for urgent and routine care needs. 3. Service will only be billed when office clinical staff spend 20 minutes or more in a month to coordinate care. 4. Only one practitioner may furnish and bill the service in a calendar month. 5.The patient may stop CCM services at any time (effective at the end of the month) by phone call to the office staff. 6. The patient will be responsible for cost sharing (co-pay) of up to 20% of the service fee (after annual deductible is met). Patient agreed to services and consent obtained.  Patient agreed to services and verbal consent obtained.   Assessment: Review of patient past medical history, allergies, medications, health status, including review of consultants reports, laboratory and other test data, was performed as part of comprehensive evaluation and provision of chronic care management services.   SDOH (Social Determinants of Health) assessments and interventions performed:  Yes, no acute challenges noted   CCM Care Plan  Allergies  Allergen Reactions   Codeine Other (See Comments)    Hallucinations    Penicillins Hives    Outpatient Encounter Medications as of 11/18/2020  Medication Sig   allopurinol (ZYLOPRIM) 300 MG tablet TAKE 1 TABLET BY MOUTH EVERY DAY   amLODipine (NORVASC) 10 MG tablet Take 1 tablet (10 mg total) by mouth daily.   azelastine (ASTELIN) 0.1 % nasal spray 1-2 sprays each nostril twice daily as needed   B Complex Vitamins (VITAMIN-B COMPLEX) TABS Take by mouth.   BENFOTIAMINE PO Take by mouth 2 (two) times daily.   Calcium Carbonate-Vit D-Min (CALCIUM 1200 PO) Take 600 mg by mouth daily.    Cholecalciferol (VITAMIN D3) 125 MCG (5000 UT) CAPS Take by mouth.    colchicine 0.6 MG tablet Take 0.6 mg by mouth as needed.   furosemide (LASIX) 20 MG tablet TAKE 1 TABLET BY MOUTH EVERY DAY   hydrALAZINE (APRESOLINE) 25 MG tablet TAKE 1 TABLET BY MOUTH TWICE A DAY   magnesium oxide (MAG-OX) 400 MG tablet Take 400 mg by mouth daily.   metoprolol succinate (TOPROL-XL) 50 MG 24 hr tablet TAKE 1 TABLET (50 MG TOTAL) BY MOUTH DAILY. TAKE WITH OR IMMEDIATELY FOLLOWING A MEAL.   Multiple Vitamin (MULTIVITAMIN+) LIQD Take by mouth.   olmesartan (BENICAR) 40 MG tablet TAKE 1 TABLET BY MOUTH EVERY DAY   Omega-3 Fatty Acids (OMEGA 3 PO) Take 2,500 mg by mouth daily. 2 tablets by mouth daily   potassium chloride (MICRO-K) 10 MEQ CR capsule TAKE 1 CAPSULE BY MOUTH EVERY DAY   Semaglutide (RYBELSUS) 3 MG TABS Take one tablet by mouth 88mns before breakfast with 4oz water.   TURMERIC CURCUMIN PO Take by mouth daily.   Zinc 50 MG  TABS Take 1 tablet by mouth daily.   No facility-administered encounter medications on file as of 11/18/2020.    Patient Active Problem List   Diagnosis Date Noted   History of wheezing 03/18/2020   Osteopenia of multiple sites 10/30/2019   Prediabetes 05/14/2018   Essential hypertension, benign 05/14/2018   Class 3 severe obesity due to excess calories with serious comorbidity and body mass index (BMI) of 40.0 to 44.9 in adult [facility]) 05/14/2018   Idiopathic  chronic gout of multiple sites without tophus 07/20/2016   Primary osteoarthritis of both hands 07/20/2016   Primary osteoarthritis of both knees 07/20/2016   Primary osteoarthritis of both feet 07/20/2016   DJD (degenerative joint disease), cervical 07/20/2016   Elevated CK 07/20/2016   History of humerus fracture 07/20/2016   History of hypertension 07/20/2016   History of diabetes mellitus 07/20/2016   History of hyperlipidemia 07/20/2016   Chronic rhinitis 10/20/2015   Anosmia 10/20/2015   Mild intermittent asthma 10/20/2015    Conditions to be addressed/monitored: Essential HTN, Prediabetes, Pure Hypercholesterolemia, Idiopathic chronic gout, Primary Osteoarthritis  Care Plan : Prediabetes  Updates made by Lynne Logan, RN since 11/18/2020 12:00 AM     Problem: Disease Progression (Prediabetes)   Priority: Medium     Long-Range Goal: Disease Progression Prevented or Minimized   Start Date: 11/18/2020  Expected End Date: 11/18/2021  This Visit's Progress: On track  Priority: Medium  Note:   Objective:  Lab Results  Component Value Date   HGBA1C 5.9 (H) 09/09/2020   Lab Results  Component Value Date   CREATININE 0.86 07/08/2020   CREATININE 0.88 05/13/2020   CREATININE 0.87 02/04/2020   Lab Results  Component Value Date   EGFR 73 07/08/2020   Current Barriers:  Knowledge Deficits related to basic Diabetes pathophysiology and self care/management Knowledge Deficits related to medications used for management of diabetes Case Manager Clinical Goal(s):  patient will demonstrate improved adherence to prescribed treatment plan for diabetes self care/management as evidenced by: adherence to ADA/ carb modified diet exercise 5 days/week adherence to prescribed medication regimen contacting provider for new or worsened symptoms or questions Interventions:  11/18/20 completed successful outbound call with patient  Collaboration with Glendale Chard, MD regarding development  and update of comprehensive plan of care as evidenced by provider attestation and co-signature Inter-disciplinary care team collaboration (see longitudinal plan of care) Provided education to patient about basic DM disease process Review of patient status, including review of consultant's reports, relevant laboratory and other test results, and medications completed. Reviewed medications with patient and discussed importance of medication adherence Mailed printed educational materials to patient's home address related to Prediabetes management Discussed plans with patient for ongoing care management follow up and provided patient with direct contact information for care management team Self-Care Activities - adhere to ADA/carb modified diet - exercise 5 days/week - adherence to prescribed medication regimen - contact provider for new or worsening symptoms or questions Patient Goals: - drink 6 to 8 glasses of water each day - manage portion size  Follow Up Plan: Telephone follow up appointment with care management team member scheduled for: 01/12/21    Care Plan : Hypertension (Adult)  Updates made by Lynne Logan, RN since 11/18/2020 12:00 AM     Problem: Hypertension (Hypertension)   Priority: High     Long-Range Goal: Hypertension Monitored   Start Date: 11/18/2020  Expected End Date: 11/18/2021  This Visit's Progress: On track  Priority: High  Note:  Objective:  Last practice recorded BP readings:  BP Readings from Last 3 Encounters:  11/17/20 122/81  09/29/20 (!) 142/72  09/09/20 132/84   Most recent eGFR/CrCl:  Lab Results  Component Value Date   EGFR 73 07/08/2020    No components found for: CRCL Current Barriers:  Knowledge Deficits related to basic understanding of hypertension pathophysiology and self care management Knowledge Deficits related to understanding of medications prescribed for management of hypertension Case Manager Clinical Goal(s):  patient  will demonstrate improved adherence to prescribed treatment plan for hypertension as evidenced by taking all medications as prescribed, monitoring and recording blood pressure as directed, adhering to low sodium/DASH diet Interventions:  11/18/20 completed successful outbound call with patient  Collaboration with Glendale Chard, MD regarding development and update of comprehensive plan of care as evidenced by provider attestation and co-signature Inter-disciplinary care team collaboration (see longitudinal plan of care) Evaluation of current treatment plan related to hypertension self management and patient's adherence to plan as established by provider. Provided education to patient re: stroke prevention, s/s of heart attack and stroke, DASH diet, complications of uncontrolled blood pressure Reviewed medications with patient and discussed importance of compliance Advised patient, providing education and rationale, to monitor blood pressure daily and record, calling PCP for findings outside established parameters.  Reviewed scheduled/upcoming provider appointments including: next Cardiology follow up appointment with Dr. Harrell Gave scheduled for 12/30/20 Mailed printed educational materials related to What is High Blood Pressure; Why Should I Restrict Sodium? Discussed plans with patient for ongoing care management follow up and provided patient with direct contact information for care management team Self-Care Activities: Self administers medications as prescribed Attends all scheduled provider appointments Calls provider office for new concerns, questions, or BP outside discussed parameters Checks BP and records as discussed Follows a low sodium diet/DASH diet Patient Goals: - check blood pressure daily - choose a place to take my blood pressure (home, clinic or office, retail store) - write blood pressure results in a log or diary - learn about high blood pressure  Follow Up Plan: Telephone  follow up appointment with care management team member scheduled for: 01/12/21     Care Plan : Osteoarthritis (Adult)  Updates made by Lynne Logan, RN since 11/18/2020 12:00 AM     Problem: Mobility and Function (Osteoarthritis/Gout)   Priority: High     Long-Range Goal: Maintain Mobility and Function   Start Date: 11/18/2020  Expected End Date: 11/18/2021  This Visit's Progress: On track  Priority: High  Note:   Current Barriers:  Ineffective Self Health Maintenance in a patient with  Osteoarthritis/Gout  Clinical Goal(s):  Collaboration with Glendale Chard, MD regarding development and update of comprehensive plan of care as evidenced by provider attestation and co-signature Inter-disciplinary care team collaboration (see longitudinal plan of care) patient will work with care management team to address care coordination and chronic disease management needs related to Disease Management Educational Needs Care Coordination Medication Management and Education Psychosocial Support   Interventions:  11/18/20 completed successful outbound call with patient  Evaluation of current treatment plan related to  Osteoarthritis/Gout  , self-management and patient's adherence to plan as established by provider. Collaboration with Glendale Chard, MD regarding development and update of comprehensive plan of care as evidenced by provider attestation       and co-signature Inter-disciplinary care team collaboration (see longitudinal plan of care) Provided education to patient about basic disease process related to Osteoarthritis  Review of patient status, including review of consultant's  reports, relevant laboratory and other test results, and medications completed. Reviewed medications with patient and discussed importance of medication adherence Discussed and reviewed the following Assessment/Plan per Dr. Estanislado Pandy from 11/17/20:    Assessment / Plan:     Visit Diagnoses: Idiopathic chronic  gout of multiple sites without tophus - allopurinol 300 mg 1 tablet by mouth daily.  She denies having a gout flare since the last visit.  She does not recall the last time she had a gout flare.   Medication monitoring encounter - Uric acid: 07/08/2020 4.1.  Her uric acid is in desirable range.  She will get repeat CBC with differential, CMP with GFR and uric acid in September.  She plans to get labs for Dr. Baird [facility] office.   Primary osteoarthritis of both hands-she continues to have some stiffness in her hands but no swelling was noted.   Primary osteoarthritis of both knees-she has limited extension but no warmth swelling or effusion was noted.   Primary osteoarthritis of both feet-proper fitting shoes were discussed.  She had no synovitis on examination.   DDD (degenerative disc disease), cervical-she had good range of motion without discomfort.   Osteopenia of multiple sites - 10/29/2019 bone density report from [place] showed T score of -2.1 in the left femoral neck consistent with osteopenia.     Other medical problems are listed as follows:   History of diabetes mellitus   History of hypertension   History of hyperlipidemia-increased risk of heart disease with gout was discussed.  Dietary modifications and exercise was emphasized.  Handout was placed in the AVS.   History of humerus fracture   Follow-Up Instructions: Return in about 6 months (around 05/20/2021). Bo Merino, MD    Determined patient is adhering to her prescribed treatment plan and denies having questions at this time Determined patient is participating in a home exercise program including cardio and water exercises Discussed plans with patient for ongoing care management follow up and provided patient with direct contact information for care management team Self Care Activities:  Self administers medications as prescribed Attends all scheduled provider appointments Calls pharmacy for  medication refills Calls provider office for new concerns or questions Patient Goals: - Maintain Mobility and Function  Follow Up Plan: Telephone follow up appointment with care management team member scheduled for: 01/12/21     Plan:Telephone follow up appointment with care management team member scheduled for:  01/12/21  Barb Merino, RN, BSN, CCM Care Management Coordinator [facility]  Direct Phone: [phone]

## 2020-12-06 DIAGNOSIS — M5442 Lumbago with sciatica, left side: Secondary | ICD-10-CM | POA: Diagnosis not present

## 2020-12-06 DIAGNOSIS — M9903 Segmental and somatic dysfunction of lumbar region: Secondary | ICD-10-CM | POA: Diagnosis not present

## 2020-12-06 DIAGNOSIS — M62830 Muscle spasm of back: Secondary | ICD-10-CM | POA: Diagnosis not present

## 2020-12-06 DIAGNOSIS — M9902 Segmental and somatic dysfunction of thoracic region: Secondary | ICD-10-CM | POA: Diagnosis not present

## 2020-12-06 DIAGNOSIS — M545 Low back pain: Secondary | ICD-10-CM | POA: Diagnosis not present

## 2020-12-06 DIAGNOSIS — M9901 Segmental and somatic dysfunction of cervical region: Secondary | ICD-10-CM | POA: Diagnosis not present

## 2020-12-15 DIAGNOSIS — M9903 Segmental and somatic dysfunction of lumbar region: Secondary | ICD-10-CM | POA: Diagnosis not present

## 2020-12-15 DIAGNOSIS — M9902 Segmental and somatic dysfunction of thoracic region: Secondary | ICD-10-CM | POA: Diagnosis not present

## 2020-12-15 DIAGNOSIS — M9901 Segmental and somatic dysfunction of cervical region: Secondary | ICD-10-CM | POA: Diagnosis not present

## 2020-12-15 DIAGNOSIS — M62830 Muscle spasm of back: Secondary | ICD-10-CM | POA: Diagnosis not present

## 2020-12-15 DIAGNOSIS — M5442 Lumbago with sciatica, left side: Secondary | ICD-10-CM | POA: Diagnosis not present

## 2020-12-15 DIAGNOSIS — M545 Low back pain: Secondary | ICD-10-CM | POA: Diagnosis not present

## 2020-12-29 ENCOUNTER — Encounter (HOSPITAL_BASED_OUTPATIENT_CLINIC_OR_DEPARTMENT_OTHER): Payer: Self-pay

## 2020-12-30 ENCOUNTER — Other Ambulatory Visit: Payer: Self-pay

## 2020-12-30 ENCOUNTER — Ambulatory Visit (HOSPITAL_BASED_OUTPATIENT_CLINIC_OR_DEPARTMENT_OTHER): Payer: Medicare HMO | Admitting: Cardiology

## 2020-12-30 ENCOUNTER — Encounter (HOSPITAL_BASED_OUTPATIENT_CLINIC_OR_DEPARTMENT_OTHER): Payer: Self-pay | Admitting: Cardiology

## 2020-12-30 VITALS — BP 126/68 | HR 69 | Ht 65.5 in | Wt 232.0 lb

## 2020-12-30 DIAGNOSIS — R0600 Dyspnea, unspecified: Secondary | ICD-10-CM

## 2020-12-30 DIAGNOSIS — Z6838 Body mass index (BMI) 38.0-38.9, adult: Secondary | ICD-10-CM

## 2020-12-30 DIAGNOSIS — I10 Essential (primary) hypertension: Secondary | ICD-10-CM | POA: Diagnosis not present

## 2020-12-30 DIAGNOSIS — Z8249 Family history of ischemic heart disease and other diseases of the circulatory system: Secondary | ICD-10-CM

## 2020-12-30 DIAGNOSIS — R0609 Other forms of dyspnea: Secondary | ICD-10-CM

## 2020-12-30 DIAGNOSIS — Z9189 Other specified personal risk factors, not elsewhere classified: Secondary | ICD-10-CM

## 2020-12-30 DIAGNOSIS — Z7189 Other specified counseling: Secondary | ICD-10-CM | POA: Diagnosis not present

## 2020-12-30 NOTE — Progress Notes (Signed)
Cardiology Office Note:    Date:  12/30/2020   ID:  Robin Odom, DOB 11-02-1950, MRN [id number]  PCP:  Glendale Chard, MD  Cardiologist:  Buford Dresser, MD  Referring MD: Glendale Chard, MD   CC: follow up  History of Present Illness:    Robin Odom is a 70 y.o. female with a hx of hypertension, asthma who is seen for follow up today. I initially met her 08/02/20 as a new consult at the request of Glendale Chard, MD for the evaluation and management of dyspnea on exertion.  CV risk: hypertension, hyperlipidemia, family history of heart disease. OSA on CPAP.  CV history: Treadmill stress with hypertensive response to exercise; echo also with diastolic dysfunction and mildly elevated filling pressure based on tissue doppler. LVEF normal, no significant valve disease.  Today: Overall she is feeling "not bad". Since June, she has been in pain due to barometric pressure exacerbating her arthritis and sciatic pain. She has difficulty with completing housework and other activities, but she pushes through.   For activity she is visiting the pool 3 days a week, 2 days a week working with her trainer, and also participates in some exercise classes. Her joint pain is the main limiting factor in her physical activity. She can walk through the grocery store or up her driveway without become short winded.  At home her blood pressure has been averaging around 956-213 systolic and was only as high as 165 on a day that she missed taking hydralazine.  Her LE edema is typically worse around her ankles. She has an alarm to try to help herself get up from her desk every half hour, and normally wears compression socks.  She denies any palpitations, chest pain. No lightheadedness, headaches, syncope, orthopnea, or PND.    Past Medical History:  Diagnosis Date   Arthritis    Diabetes mellitus without complication ([place])    Gout    Hypertension    Partial retinal tear of both eyes without detachment  08/29/2017    Past Surgical History:  Procedure Laterality Date   ABDOMINAL HYSTERECTOMY  1991   CARPAL TUNNEL RELEASE Right 2019   COLONOSCOPY     5 between 1994-2010   Left knee surgery  2006   ROTATOR CUFF REPAIR Left 2001   ROTATOR CUFF REPAIR Right 2003   SHOULDER SURGERY  2001/2003   TONSILECTOMY/ADENOIDECTOMY WITH MYRINGOTOMY  1971    Current Medications: Current Outpatient Medications on File Prior to Visit  Medication Sig   allopurinol (ZYLOPRIM) 300 MG tablet TAKE 1 TABLET BY MOUTH EVERY DAY   amLODipine (NORVASC) 10 MG tablet Take 1 tablet (10 mg total) by mouth daily.   azelastine (ASTELIN) 0.1 % nasal spray 1-2 sprays each nostril twice daily as needed   B Complex Vitamins (VITAMIN-B COMPLEX) TABS Take by mouth.   BENFOTIAMINE PO Take by mouth 2 (two) times daily.   calcium carbonate (CALCIUM 600) 600 MG TABS tablet Take 600 mg by mouth daily.   Cholecalciferol (VITAMIN D3) 125 MCG (5000 UT) CAPS Take by mouth.    colchicine 0.6 MG tablet Take 0.6 mg by mouth as needed.   furosemide (LASIX) 20 MG tablet TAKE 1 TABLET BY MOUTH EVERY DAY   hydrALAZINE (APRESOLINE) 25 MG tablet TAKE 1 TABLET BY MOUTH TWICE A DAY   magnesium oxide (MAG-OX) 400 MG tablet Take 400 mg by mouth daily.   metoprolol succinate (TOPROL-XL) 50 MG 24 hr tablet TAKE 1 TABLET (50 MG TOTAL) BY MOUTH  DAILY. TAKE WITH OR IMMEDIATELY FOLLOWING A MEAL.   Multiple Vitamin (MULTIVITAMIN+) LIQD Take by mouth.   olmesartan (BENICAR) 40 MG tablet TAKE 1 TABLET BY MOUTH EVERY DAY   Omega-3 Fatty Acids (OMEGA 3 PO) Take 2,500 mg by mouth daily. 2 tablets by mouth daily   potassium chloride (MICRO-K) 10 MEQ CR capsule TAKE 1 CAPSULE BY MOUTH EVERY DAY   Semaglutide (RYBELSUS) 3 MG TABS Take one tablet by mouth 48mns before breakfast with 4oz water.   TURMERIC CURCUMIN PO Take by mouth daily.   Zinc 50 MG TABS Take 1 tablet by mouth daily.   No current facility-administered medications on file prior to visit.      Allergies:   Codeine and Penicillins   Social History   Tobacco Use   Smoking status: Never   Smokeless tobacco: Never  Vaping Use   Vaping Use: Never used  Substance Use Topics   Alcohol use: Yes    Comment: rarely   Drug use: No    Family History: family history includes Allergic rhinitis in her sister; Arthritis in her father; Diabetes in her brother, mother, and sister; Heart disease in her father and mother; High Cholesterol in her brother and sister; Hypertension in her brother, brother, father, mother, and sister; Parkinson's disease in her father; Prostate cancer in her father; Sinusitis in her sister. There is no history of Angioedema, Asthma, Eczema, Immunodeficiency, Urticaria, or Breast cancer.   3/4 of her siblings has HTN. 2/4 of her siblings has DM. Mother had Type II DM(most of maternal siblings had DM). Mother had unknown CAD and underwent a bypass surgery three years prior to death of a MI in 17 Most of her maternal sibling died from MI. Father had Type II  DM(Paternal sibling no DM). Unaware of any strokes on her fathers side, mostly cancer.    ROS:   Please see the history of present illness.   (+) Myalgias (+) Joint pain (+) Bilateral LE edema, ankles Additional pertinent ROS otherwise unremarkable.  EKGs/Labs/Other Studies Reviewed:    The following studies were reviewed today:  Echo 08/27/20 1. Left ventricular ejection fraction, by estimation, is 60 to 65%. The  left ventricle has normal function. The left ventricle has no regional  wall motion abnormalities. Left ventricular diastolic parameters are  consistent with Grade I diastolic  dysfunction (impaired relaxation). Elevated left atrial pressure. The E/e'  is 177   2. Right ventricular systolic function is normal. The right ventricular  size is normal.   3. Left atrial size was mildly dilated.   4. The mitral valve is normal in structure. No evidence of mitral valve  regurgitation. No  evidence of mitral stenosis.   5. The aortic valve is normal in structure. Aortic valve regurgitation is  not visualized. No aortic stenosis is present.   6. The inferior vena cava is normal in size with greater than 50%  respiratory variability, suggesting right atrial pressure of 3 mmHg.   ETT 08/18/20  Blood pressure demonstrated a hypertensive response to exercise. There was no ST segment deviation noted during stress.   Normal ETT HTN response to exercise   EKG:  Personally reviewed 12/30/2020: NSR at 69 bpm, PRWP  07/31/20: NSR at 74 bpm  Recent Labs: 07/08/2020: ALT 16; BUN 15; Creatinine, Ser 0.86; Hemoglobin 13.0; Platelets 340; Potassium 4.0; Sodium 142 07/13/2020: BNP 21.9   Recent Lipid Panel    Component Value Date/Time   CHOL 188 07/08/2020 1528   TRIG  63 07/08/2020 1528   HDL 54 07/08/2020 1528   CHOLHDL 3.5 07/08/2020 1528   LDLCALC 122 (H) 07/08/2020 1528    Physical Exam:    VS:  BP 126/68   Pulse 69   Ht 5' 5.5" (1.664 m)   Wt 232 lb (105.2 kg)   SpO2 96%   BMI 38.02 kg/m     Wt Readings from Last 3 Encounters:  12/30/20 232 lb (105.2 kg)  11/17/20 236 lb (107 kg)  09/29/20 235 lb 12.8 oz (107 kg)    GEN: Well nourished, well developed in no acute distress HEENT: Normal, moist mucous membranes NECK: No JVD CARDIAC: regular rhythm, normal S1 and S2, no rubs or gallops. No murmur. VASCULAR: Radial and DP pulses 2+ bilaterally. No carotid bruits RESPIRATORY:  Clear to auscultation without rales, wheezing or rhonchi  ABDOMEN: Soft, non-tender, non-distended MUSCULOSKELETAL:  Ambulates independently SKIN: Warm and dry, trace bilateral ankle edema NEUROLOGIC:  Alert and oriented x 3. No focal neuro deficits noted. PSYCHIATRIC:  Normal affect     ASSESSMENT:    1. Essential hypertension   2. DOE (dyspnea on exertion)   3. Family history of heart disease   4. Class 2 severe obesity due to excess calories with serious comorbidity and body mass index  (BMI) of 38.0 to 38.9 in adult [facility])   5. Cardiac risk counseling   6. Counseling on health promotion and disease prevention   7. At increased risk for cardiovascular disease     PLAN:    Dyspnea on exertion: Hypertension: goal <130/80 -Both echo and treadmill stress suggest that elevated blood pressure may be causing some of her findings, both diastolic dysfunction and hypertensive BP response to exercise. Otherwise, structurally normal heart, no ischemia  Current medications Amlodipine 10 mg daily (at night) Furosemide 20 mg (usually once/day, two if swollen) Hydralazine 25 mg BID (noon and 5-6 PM)--would wean this if BP better on higher dose on amlodipine Metoprolol succinate 50 mg daily (in the morning)--discussed changing to carvedilol next Olmesartan 40 mg daily (in the morning) Never been on spironolactone. Consider if additional med needed after weaning hydralazine -she will contact me via mychart with BP readings and we will adjust regimen accordingly. -discussed exercise, sodium, how lifestyle can also affect BP  Hyperlipidemia: Elevated ASCVD risk score -per [facility], Tchol 188, HDL 54, LDL 122, TG 63 07/08/20 -we discussed her elevated ASCVD risk and recommendations for statins. She declines.  Class 2 obesity: working on weight loss, BMI down to 38 today  Cardiac risk counseling and prevention recommendations: -recommend heart healthy/Mediterranean diet, with whole grains, fruits, vegetable, fish, lean meats, nuts, and olive oil. Limit salt. -recommend moderate walking, 3-5 times/week for 30-50 minutes each session. Aim for at least 150 minutes.week. Goal should be pace of 3 miles/hours, or walking 1.5 miles in 30 minutes -recommend avoidance of tobacco products. Avoid excess alcohol. -ASCVD risk score: The 10-year ASCVD risk score Mikey Bussing DC Brooke Bonito., et al., 2013) is: 25.5%   Values used to calculate the score:     Age: 25 years     Sex: Female     Is Non-Hispanic African  American: Yes     Diabetic: Yes     Tobacco smoker: No     Systolic Blood Pressure: 315 mmHg     Is BP treated: Yes     HDL Cholesterol: 54 mg/dL     Total Cholesterol: 188 mg/dL    Plan for follow up: 6 months or sooner  as needed (with mychart checkin/adjustments between visits)  Buford Dresser, MD, [name], [facility] [address] [place], [place] [address] [phone]   Medication Adjustments/Labs and Tests Ordered: Current medicines are reviewed at length with the patient today.  Concerns regarding medicines are outlined above.   Orders Placed This Encounter  Procedures   EKG 12-Lead    No orders of the defined types were placed in this encounter.   Patient Instructions   Medication Instructions:  Your Physician recommend you continue on your current medication as directed.    *If you need a refill on your cardiac medications before your next appointment, please call your pharmacy*   Lab Work: None ordered today   Testing/Procedures: None ordered    Follow-Up: At [facility], you and your health needs are our priority.  As part of our continuing mission to provide you with exceptional heart care, we have created designated Provider Care Teams.  These Care Teams include your primary Cardiologist (physician) and Advanced Practice Providers (APPs -  Physician Assistants and Nurse Practitioners) who all work together to provide you with the care you need, when you need it.  We recommend signing up for the patient portal called "MyChart".  Sign up information is provided on this After Visit Summary.  MyChart is used to connect with patients for Virtual Visits (Telemedicine).  Patients are able to view lab/test results, encounter notes, upcoming appointments, etc.  Non-urgent messages can be sent to your provider as well.   To learn more about what  you can do with MyChart, go to [email or web address].    Your next appointment:   6 month(s)  The format for your next appointment:   In Person  Provider:   Buford Dresser, MD      [facility],acting as a scribe for Buford Dresser, MD.,have documented all relevant documentation on the behalf of Buford Dresser, MD,as directed by  Buford Dresser, MD while in the presence of Buford Dresser, MD.  I, Buford Dresser, MD, have reviewed all documentation for this visit. The documentation on 01/19/21 for the exam, diagnosis, procedures, and orders are all accurate and complete.   Signed, Buford Dresser, MD [name] 12/30/2020  [facility]

## 2020-12-30 NOTE — Patient Instructions (Signed)
Medication Instructions:  Your Physician recommend you continue on your current medication as directed.    *If you need a refill on your cardiac medications before your next appointment, please call your pharmacy*   Lab Work: None ordered today   Testing/Procedures: None ordered    Follow-Up: At [facility], you and your health needs are our priority.  As part of our continuing mission to provide you with exceptional heart care, we have created designated Provider Care Teams.  These Care Teams include your primary Cardiologist (physician) and Advanced Practice Providers (APPs -  Physician Assistants and Nurse Practitioners) who all work together to provide you with the care you need, when you need it.  We recommend signing up for the patient portal called "MyChart".  Sign up information is provided on this After Visit Summary.  MyChart is used to connect with patients for Virtual Visits (Telemedicine).  Patients are able to view lab/test results, encounter notes, upcoming appointments, etc.  Non-urgent messages can be sent to your provider as well.   To learn more about what you can do with MyChart, go to [email or web address].    Your next appointment:   6 month(s)  The format for your next appointment:   In Person  Provider:   Buford Dresser, MD

## 2021-01-03 ENCOUNTER — Other Ambulatory Visit: Payer: Self-pay | Admitting: Internal Medicine

## 2021-01-03 DIAGNOSIS — I10 Essential (primary) hypertension: Secondary | ICD-10-CM

## 2021-01-04 DIAGNOSIS — G4733 Obstructive sleep apnea (adult) (pediatric): Secondary | ICD-10-CM | POA: Diagnosis not present

## 2021-01-05 DIAGNOSIS — M5442 Lumbago with sciatica, left side: Secondary | ICD-10-CM | POA: Diagnosis not present

## 2021-01-05 DIAGNOSIS — M9903 Segmental and somatic dysfunction of lumbar region: Secondary | ICD-10-CM | POA: Diagnosis not present

## 2021-01-05 DIAGNOSIS — M9901 Segmental and somatic dysfunction of cervical region: Secondary | ICD-10-CM | POA: Diagnosis not present

## 2021-01-05 DIAGNOSIS — M9902 Segmental and somatic dysfunction of thoracic region: Secondary | ICD-10-CM | POA: Diagnosis not present

## 2021-01-05 DIAGNOSIS — M545 Low back pain: Secondary | ICD-10-CM | POA: Diagnosis not present

## 2021-01-05 DIAGNOSIS — M62830 Muscle spasm of back: Secondary | ICD-10-CM | POA: Diagnosis not present

## 2021-01-06 ENCOUNTER — Telehealth: Payer: Self-pay | Admitting: Rheumatology

## 2021-01-06 ENCOUNTER — Other Ambulatory Visit: Payer: Self-pay | Admitting: Physician Assistant

## 2021-01-06 NOTE — Telephone Encounter (Signed)
Noted  

## 2021-01-06 NOTE — Telephone Encounter (Signed)
FYI: Patient wanted to let you know she is aware labs are due. She has an appointment with Dr. Baird Cancer this coming week, and will have labs drawn with her. Results will be in the system for [place] to check, per patient.

## 2021-01-06 NOTE — Telephone Encounter (Signed)
Next Visit: 05/18/2021  Last Visit: 11/17/2020  Labs: 07/08/2020 CBC and CMP normal.  Uric acid is in desirable range  Current Dose per office note 11/17/2020: allopurinol 300 mg 1 tablet by mouth daily  DX: 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 tophus   Last Fill: 10/11/2020  Left message to advise patient she is due to update labs.   Okay to refill Allopurinol?

## 2021-01-10 ENCOUNTER — Encounter: Payer: Medicare HMO | Admitting: Internal Medicine

## 2021-01-10 NOTE — Patient Instructions (Signed)
Health Maintenance, Female Adopting a healthy lifestyle and getting preventive care are important in promoting health and wellness. Ask your health care provider about: The right schedule for you to have regular tests and exams. Things you can do on your own to prevent diseases and keep yourself healthy. What should I know about diet, weight, and exercise? Eat a healthy diet  Eat a diet that includes plenty of vegetables, fruits, low-fat dairy products, and lean protein. Do not eat a lot of foods that are high in solid fats, added sugars, or sodium. Maintain a healthy weight Body mass index (BMI) is used to identify weight problems. It estimates body fat based on height and weight. Your health care provider can help determine your BMI and help you achieve or maintain a healthy weight. Get regular exercise Get regular exercise. This is one of the most important things you can do for your health. Most adults should: Exercise for at least 150 minutes each week. The exercise should increase your heart rate and make you sweat (moderate-intensity exercise). Do strengthening exercises at least twice a week. This is in addition to the moderate-intensity exercise. Spend less time sitting. Even light physical activity can be beneficial. Watch cholesterol and blood lipids Have your blood tested for lipids and cholesterol at 71 years of age, then have this test every 5 years. Have your cholesterol levels checked more often if: Your lipid or cholesterol levels are high. You are older than 71 years of age. You are at high risk for heart disease. What should I know about cancer screening? Depending on your health history and family history, you may need to have cancer screening at various ages. This may include screening for: Breast cancer. Cervical cancer. Colorectal cancer. Skin cancer. Lung cancer. What should I know about heart disease, diabetes, and high blood pressure? Blood pressure and heart  disease High blood pressure causes heart disease and increases the risk of stroke. This is more likely to develop in people who have high blood pressure readings, are of African descent, or are overweight. Have your blood pressure checked: Every 3-5 years if you are 18-39 years of age. Every year if you are 40 years old or older. Diabetes Have regular diabetes screenings. This checks your fasting blood sugar level. Have the screening done: Once every three years after age 40 if you are at a normal weight and have a low risk for diabetes. More often and at a younger age if you are overweight or have a high risk for diabetes. What should I know about preventing infection? Hepatitis B If you have a higher risk for hepatitis B, you should be screened for this virus. Talk with your health care provider to find out if you are at risk for hepatitis B infection. Hepatitis C Testing is recommended for: Everyone born from 1945 through 1965. Anyone with known risk factors for hepatitis C. Sexually transmitted infections (STIs) Get screened for STIs, including gonorrhea and chlamydia, if: You are sexually active and are younger than 71 years of age. You are older than 71 years of age and your health care provider tells you that you are at risk for this type of infection. Your sexual activity has changed since you were last screened, and you are at increased risk for chlamydia or gonorrhea. Ask your health care provider if you are at risk. Ask your health care provider about whether you are at high risk for HIV. Your health care provider may recommend a prescription medicine   to help prevent HIV infection. If you choose to take medicine to prevent HIV, you should first get tested for HIV. You should then be tested every 3 months for as long as you are taking the medicine. Pregnancy If you are about to stop having your period (premenopausal) and you may become pregnant, seek counseling before you get  pregnant. Take 400 to 800 micrograms (mcg) of folic acid every day if you become pregnant. Ask for birth control (contraception) if you want to prevent pregnancy. Osteoporosis and menopause Osteoporosis is a disease in which the bones lose minerals and strength with aging. This can result in bone fractures. If you are 65 years old or older, or if you are at risk for osteoporosis and fractures, ask your health care provider if you should: Be screened for bone loss. Take a calcium or vitamin D supplement to lower your risk of fractures. Be given hormone replacement therapy (HRT) to treat symptoms of menopause. Follow these instructions at home: Lifestyle Do not use any products that contain nicotine or tobacco, such as cigarettes, e-cigarettes, and chewing tobacco. If you need help quitting, ask your health care provider. Do not use street drugs. Do not share needles. Ask your health care provider for help if you need support or information about quitting drugs. Alcohol use Do not drink alcohol if: Your health care provider tells you not to drink. You are pregnant, may be pregnant, or are planning to become pregnant. If you drink alcohol: Limit how much you use to 0-1 drink a day. Limit intake if you are breastfeeding. Be aware of how much alcohol is in your drink. In the [place] one drink equals one 12 oz bottle of beer (355 mL), one 5 oz glass of wine (148 mL), or one 1 oz glass of hard liquor (44 mL). General instructions Schedule regular health, dental, and eye exams. Stay current with your vaccines. Tell your health care provider if: You often feel depressed. You have ever been abused or do not feel safe at home. Summary Adopting a healthy lifestyle and getting preventive care are important in promoting health and wellness. Follow your health care provider's instructions about healthy diet, exercising, and getting tested or screened for diseases. Follow your health care provider's  instructions on monitoring your cholesterol and blood pressure. This information is not intended to replace advice given to you by your health care provider. Make sure you discuss any questions you have with your health care provider. Document Revised: 06/25/2020 Document Reviewed: 04/10/2018 Elsevier Patient Education  2022 Elsevier Inc.  

## 2021-01-10 NOTE — Progress Notes (Signed)
Pt was a no-show, erroneous encounter

## 2021-01-11 ENCOUNTER — Other Ambulatory Visit: Payer: Self-pay

## 2021-01-11 ENCOUNTER — Other Ambulatory Visit: Payer: Medicare HMO

## 2021-01-11 DIAGNOSIS — R7303 Prediabetes: Secondary | ICD-10-CM

## 2021-01-11 DIAGNOSIS — E7800 Pure hypercholesterolemia, unspecified: Secondary | ICD-10-CM

## 2021-01-11 DIAGNOSIS — M1A09X0 Idiopathic chronic gout, multiple sites, without tophus (tophi): Secondary | ICD-10-CM

## 2021-01-11 DIAGNOSIS — Z79899 Other long term (current) drug therapy: Secondary | ICD-10-CM

## 2021-01-11 NOTE — Addendum Note (Signed)
Addended by: Michelle Nasuti on: 01/11/2021 02:05 PM   Modules accepted: Orders

## 2021-01-12 ENCOUNTER — Telehealth: Payer: Self-pay

## 2021-01-12 ENCOUNTER — Telehealth: Payer: Medicare HMO

## 2021-01-12 NOTE — Telephone Encounter (Signed)
  Care Management   Follow Up Note   01/12/2021 Name: Robin Odom MRN: [id number] DOB: Mar 12, 1951   Referred by: Glendale Chard, MD Reason for referral : Chronic Care Management (RN CM Follow up call )   An unsuccessful telephone outreach was attempted today. The patient was referred to the case management team for assistance with care management and care coordination.   Follow Up Plan: A HIPPA compliant phone message was left for the patient providing contact information and requesting a return call.   Barb Merino, RN, BSN, CCM Care Management Coordinator [place] [facility]  Direct Phone: [phone]

## 2021-01-13 ENCOUNTER — Telehealth: Payer: Self-pay

## 2021-01-13 NOTE — Telephone Encounter (Signed)
  Care Management   Follow Up Note   01/13/2021 Name: Robin Odom MRN: [id number] DOB: 12-15-1950   Referred by: Glendale Chard, MD Reason for referral : Chronic Care Management (Return call from patient )  Voice message received from patient stating she is returning my call from yesterday. Notified patient via my chart message making her aware of the rescheduled RN CCM appointment scheduled for 02/01/21 @ 1:45 PM following her 01/27/21 appointment with Bary Castilla FNP.  Encouraged patient to call back or reply if this time does not work for her. The patient was referred to the case management team for assistance with care management and care coordination.   Follow Up Plan: Telephone follow up appointment with care management team member scheduled for: 02/01/21  Barb Merino, RN, BSN, CCM Care Management Coordinator [place] [facility]  Direct Phone: [phone]

## 2021-01-14 ENCOUNTER — Other Ambulatory Visit: Payer: Self-pay | Admitting: Internal Medicine

## 2021-01-14 ENCOUNTER — Other Ambulatory Visit: Payer: Self-pay | Admitting: *Deleted

## 2021-01-14 DIAGNOSIS — M5442 Lumbago with sciatica, left side: Secondary | ICD-10-CM | POA: Diagnosis not present

## 2021-01-14 DIAGNOSIS — M9903 Segmental and somatic dysfunction of lumbar region: Secondary | ICD-10-CM | POA: Diagnosis not present

## 2021-01-14 DIAGNOSIS — Z5181 Encounter for therapeutic drug level monitoring: Secondary | ICD-10-CM

## 2021-01-14 DIAGNOSIS — M1A09X0 Idiopathic chronic gout, multiple sites, without tophus (tophi): Secondary | ICD-10-CM

## 2021-01-14 DIAGNOSIS — Z1231 Encounter for screening mammogram for malignant neoplasm of breast: Secondary | ICD-10-CM

## 2021-01-14 DIAGNOSIS — M9902 Segmental and somatic dysfunction of thoracic region: Secondary | ICD-10-CM | POA: Diagnosis not present

## 2021-01-14 DIAGNOSIS — M9901 Segmental and somatic dysfunction of cervical region: Secondary | ICD-10-CM | POA: Diagnosis not present

## 2021-01-15 LAB — COMPLETE METABOLIC PANEL WITH GFR
AG Ratio: 1.5 (calc) (ref 1.0–2.5)
ALT: 12 U/L (ref 6–29)
AST: 18 U/L (ref 10–35)
Albumin: 4.2 g/dL (ref 3.6–5.1)
Alkaline phosphatase (APISO): 68 U/L (ref 37–153)
BUN: 19 mg/dL (ref 7–25)
CO2: 32 mmol/L (ref 20–32)
Calcium: 9.7 mg/dL (ref 8.6–10.4)
Chloride: 105 mmol/L (ref 98–110)
Creat: 0.75 mg/dL (ref 0.60–1.00)
Globulin: 2.8 g/dL (calc) (ref 1.9–3.7)
Glucose, Bld: 88 mg/dL (ref 65–99)
Potassium: 4.1 mmol/L (ref 3.5–5.3)
Sodium: 144 mmol/L (ref 135–146)
Total Bilirubin: 0.5 mg/dL (ref 0.2–1.2)
Total Protein: 7 g/dL (ref 6.1–8.1)
eGFR: 85 mL/min/{1.73_m2} (ref >=60)

## 2021-01-15 LAB — CBC WITH DIFFERENTIAL/PLATELET
Absolute Monocytes: 771 cells/uL (ref 200–950)
Basophils Absolute: 56 cells/uL (ref 0–200)
Basophils Relative: 0.6 %
Eosinophils Absolute: 179 cells/uL (ref 15–500)
Eosinophils Relative: 1.9 %
HCT: 41.8 % (ref 35.0–45.0)
Hemoglobin: 13.1 g/dL (ref 11.7–15.5)
Lymphs Abs: 2547 cells/uL (ref 850–3900)
MCH: 25.8 pg — ABNORMAL LOW (ref 27.0–33.0)
MCHC: 31.3 g/dL — ABNORMAL LOW (ref 32.0–36.0)
MCV: 82.4 fL (ref 80.0–100.0)
MPV: 10.4 fL (ref 7.5–12.5)
Monocytes Relative: 8.2 %
Neutro Abs: 5847 cells/uL (ref 1500–7800)
Neutrophils Relative %: 62.2 %
Platelets: 309 10*3/uL (ref 140–400)
RBC: 5.07 10*6/uL (ref 3.80–5.10)
RDW: 14.7 % (ref 11.0–15.0)
Total Lymphocyte: 27.1 %
WBC: 9.4 10*3/uL (ref 3.8–10.8)

## 2021-01-15 LAB — URIC ACID: Uric Acid, Serum: 4.2 mg/dL (ref 2.5–7.0)

## 2021-01-17 ENCOUNTER — Telehealth: Payer: Self-pay

## 2021-01-17 NOTE — Chronic Care Management (AMB) (Signed)
01/17/2021- APPOINTMENT REMINDER   Called Robin Odom, No answer, left message to reschedule appointment on 01/18/2021 at 11:00 AM via telephone visit to 3:00 PM with Orlando Penner, Pharm D. Or for another day. Awaiting return call.   Pattricia Boss, [place] Pharmacist Assistant [phone]

## 2021-01-17 NOTE — Progress Notes (Signed)
CMP WNL.  CBC stable.  Uric acid is within the desirable range.

## 2021-01-18 ENCOUNTER — Telehealth: Payer: Self-pay

## 2021-01-18 NOTE — Progress Notes (Deleted)
Current Barriers:  {pharmacybarriers:24917}  Pharmacist Clinical Goal(s):  Patient will {PHARMACYGOALCHOICES:24921} through collaboration with PharmD and provider.   Interventions: 1:1 collaboration with Glendale Chard, MD regarding development and update of comprehensive plan of care as evidenced by provider attestation and co-signature Inter-disciplinary care team collaboration (see longitudinal plan of care) Comprehensive medication review performed; medication list updated in electronic medical record  {CCM [facility] DISEASE STATES:25130}  Patient Goals/Self-Care Activities Patient will:  - {pharmacypatientgoals:24919}  Follow Up Plan: {CM FOLLOW UP URKY:70623}   Chronic Care Management Pharmacy Note  01/18/2021 Name:  Robin Odom MRN:  [id number] DOB:  03-Mar-1951  Summary: ***  Recommendations/Changes made from today's visit: ***  Plan: ***   Subjective: Robin Odom is an 70 y.o. year old female who is a primary patient of Glendale Chard, MD.  The CCM team was consulted for assistance with disease management and care coordination needs.    {CCMTELEPHONEFACETOFACE:21091510} for {CCMINITIALFOLLOWUPCHOICE:21091511} in response to provider referral for pharmacy case management and/or care coordination services.   Consent to Services:  {CCMCONSENTOPTIONS:25074}  Patient Care Team: Glendale Chard, MD as PCP - General (Internal Medicine) Buford Dresser, MD as PCP - Cardiology (Cardiology) Rex Kras, Claudette Stapler, RN as [facility] Pearson, Sharyn Blitz, [facility] (Pharmacist)  Recent office visits: ***  Recent consult visits: [facility] visits: {Hospital DC Yes/No:25215}   Objective:  Lab Results  Component Value Date   CREATININE 0.75 01/14/2021   BUN 19 01/14/2021   GFRNONAA 67 05/13/2020   GFRAA 77 05/13/2020   NA 144 01/14/2021   K 4.1 01/14/2021   CALCIUM 9.7 01/14/2021   CO2 32 01/14/2021   GLUCOSE 88 01/14/2021    Lab Results   Component Value Date/Time   HGBA1C 5.9 (H) 09/09/2020 03:49 PM   HGBA1C 5.9 (H) 05/13/2020 02:14 PM   MICROALBUR 10 05/13/2020 11:14 AM   MICROALBUR 10 04/16/2019 12:24 PM    Last diabetic Eye exam: No results found for: HMDIABEYEEXA  Last diabetic Foot exam: No results found for: HMDIABFOOTEX   Lab Results  Component Value Date   CHOL 188 07/08/2020   HDL 54 07/08/2020   LDLCALC 122 (H) 07/08/2020   TRIG 63 07/08/2020   CHOLHDL 3.5 07/08/2020    Hepatic Function Latest Ref Rng & Units 01/14/2021 07/08/2020 02/04/2020  Total Protein 6.1 - 8.1 g/dL 7.0 7.0 6.5  Albumin 3.8 - 4.8 g/dL - 4.4 -  AST 10 - 35 U/L _0 ALT 6 - 29 U/L _1 Alk Phosphatase 44 - 121 IU/L - 71 -  Total Bilirubin 0.2 - 1.2 mg/dL 0.5 0.3 0.3    No results found for: TSH, FREET4  CBC Latest Ref Rng & Units 01/14/2021 07/08/2020 02/04/2020  WBC 3.8 - 10.8 Thousand/uL 9.4 10.5 10.4  Hemoglobin 11.7 - 15.5 g/dL 13.1 13.0 12.7  Hematocrit 35.0 - 45.0 % 41.8 40.8 38.6  Platelets 140 - 400 Thousand/uL 309 340 316    No results found for: VD25OH  Clinical ASCVD: {YES/NO:21197} The 10-year ASCVD risk score (Arnett DK, et al., 2019) is: 25.5%   Values used to calculate the score:     Age: 57 years     Sex: Female     Is Non-Hispanic African American: Yes     Diabetic: Yes     Tobacco smoker: No     Systolic Blood Pressure: 616 mmHg     Is BP treated: Yes     HDL Cholesterol: 54 mg/dL  Total Cholesterol: 188 mg/dL    Depression screen [facility] 2/9 05/13/2020 04/16/2019 07/11/2018  Decreased Interest 0 0 0  Down, Depressed, Hopeless 0 0 0  PHQ - 2 Score 0 0 0  Altered sleeping - 3 -  Tired, decreased energy - 0 -  Change in appetite - 0 -  Feeling bad or failure about yourself  - 0 -  Trouble concentrating - 0 -  Moving slowly or fidgety/restless - 0 -  Suicidal thoughts - 0 -  PHQ-9 Score - 3 -  Difficult doing work/chores - Not difficult at all -     ***Other: (CHADS2VASc if Afib, MMRC or  CAT for COPD, ACT, DEXA)  Social History   Tobacco Use  Smoking Status Never  Smokeless Tobacco Never   BP Readings from Last 3 Encounters:  12/30/20 126/68  11/17/20 122/81  09/29/20 (!) 142/72   Pulse Readings from Last 3 Encounters:  12/30/20 69  11/17/20 62  09/29/20 65   Wt Readings from Last 3 Encounters:  12/30/20 232 lb (105.2 kg)  11/17/20 236 lb (107 kg)  09/29/20 235 lb 12.8 oz (107 kg)   BMI Readings from Last 3 Encounters:  12/30/20 38.02 kg/m  11/17/20 39.27 kg/m  09/29/20 39.24 kg/m    Assessment/Interventions: Review of patient past medical history, allergies, medications, health status, including review of consultants reports, laboratory and other test data, was performed as part of comprehensive evaluation and provision of chronic care management services.   SDOH:  (Social Determinants of Health) assessments and interventions performed: {yes/no:20286}  SDOH Screenings   Alcohol Screen: Not on file  Depression (PHQ2-9): Low Risk    PHQ-2 Score: 0  Financial Resource Strain: Medium Risk   Difficulty of Paying Living Expenses: Somewhat hard  Food Insecurity: No Food Insecurity   Worried About Charity fundraiser in the Last Year: Never true   Ran Out of Food in the Last Year: Never true  Housing: Low Risk    Last Housing Risk Score: 0  Physical Activity: Sufficiently Active   Days of Exercise per Week: 5 days   Minutes of Exercise per Session: 30 min  Social Connections: Not on file  Stress: No Stress Concern Present   Feeling of Stress : Only a little  Tobacco Use: Low Risk    Smoking Tobacco Use: Never   Smokeless Tobacco Use: Never  Transportation Needs: No Transportation Needs   Lack of Transportation (Medical): No   Lack of Transportation (Non-Medical): No    CCM Care Plan  Allergies  Allergen Reactions   Codeine Other (See Comments)    Hallucinations   Penicillins Hives    Medications Reviewed Today     Reviewed by Wonda Horner, CMA (Certified Medical Assistant) on 12/30/20 at 1508  Med List Status: <None>   Medication Order Taking? Sig Documenting Provider Last Dose Status Informant  allopurinol (ZYLOPRIM) 300 MG tablet [id number] Yes TAKE 1 TABLET BY MOUTH EVERY DAY Ofilia Neas, PA-C Taking Active   amLODipine (NORVASC) 10 MG tablet [id number] Yes Take 1 tablet (10 mg total) by mouth daily. Glendale Chard, MD Taking Active   azelastine (ASTELIN) 0.1 % nasal spray [id number] Yes 1-2 sprays each nostril twice daily as needed Bobbitt, [place] Muta, MD Taking Active   B Complex Vitamins (VITAMIN-B COMPLEX) TABS [id number] Yes Take by mouth. [provider] Taking Active            Med Note Iona Beard, STEPHANIE A   Mon  Aug 02, 2020  2:55 PM)    BENFOTIAMINE PO [id number] Yes Take by mouth 2 (two) times daily. [provider] Taking Active   calcium carbonate (CALCIUM 600) 600 MG TABS tablet [id number] Yes Take 600 mg by mouth daily. [provider] Taking Active   Cholecalciferol (VITAMIN D3) 125 MCG (5000 UT) CAPS [id number] Yes Take by mouth.  [provider] Taking Active            Med Note Iona Beard, STEPHANIE A   Mon Aug 02, 2020  2:55 PM)    colchicine 0.6 MG tablet [id number] Yes Take 0.6 mg by mouth as needed. [provider] Taking Active   furosemide (LASIX) 20 MG tablet [id number] Yes TAKE 1 TABLET BY MOUTH EVERY DAY Glendale Chard, MD Taking Active   hydrALAZINE (APRESOLINE) 25 MG tablet [id number] Yes TAKE 1 TABLET BY MOUTH TWICE A Lynnell Dike, MD Taking Active   magnesium oxide (MAG-OX) 400 MG tablet [id number] Yes Take 400 mg by mouth daily. [provider] Taking Active   metoprolol succinate (TOPROL-XL) 50 MG 24 hr tablet [id number] Yes TAKE 1 TABLET (50 MG TOTAL) BY MOUTH DAILY. TAKE WITH OR IMMEDIATELY FOLLOWING A MEAL. Glendale Chard, MD Taking Active   Multiple Vitamin (MULTIVITAMIN+) LIQD [id number] Yes Take by mouth. [provider] Taking  Active   olmesartan (BENICAR) 40 MG tablet [id number] Yes TAKE 1 TABLET BY MOUTH EVERY DAY Glendale Chard, MD Taking Active   Omega-3 Fatty Acids (OMEGA 3 PO) [id number] Yes Take 2,500 mg by mouth daily. 2 tablets by mouth daily [provider] Taking Active   potassium chloride (MICRO-K) 10 MEQ CR capsule [id number] Yes TAKE 1 CAPSULE BY MOUTH EVERY DAY Glendale Chard, MD Taking Active   Semaglutide [facility]) 3 MG TABS [id number] Yes Take one tablet by mouth 41mns before breakfast with 4oz water. SGlendale Chard MD Taking Active   TURMERIC CURCUMIN PO [id number] Take by mouth daily. [provider] Taking Active   Zinc 50 MG TABS [id number] Take 1 tablet by mouth daily. [provider] Taking Active Self            Patient Active Problem List   Diagnosis Date Noted   History of wheezing 03/18/2020   Osteopenia of multiple sites 10/30/2019   Prediabetes 05/14/2018   Essential hypertension, benign 05/14/2018   Class 3 severe obesity due to excess calories with serious comorbidity and body mass index (BMI) of 40.0 to 44.9 in adult ([place] 05/14/2018   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 tophus 07/20/2016   Primary osteoarthritis of both hands 07/20/2016   Primary osteoarthritis of both knees 07/20/2016   Primary osteoarthritis of both feet 07/20/2016   DJD (degenerative joint disease), cervical 07/20/2016   Elevated CK 07/20/2016   History of humerus fracture 07/20/2016   History of hypertension 07/20/2016   History of diabetes mellitus 07/20/2016   History of hyperlipidemia 07/20/2016   Chronic rhinitis 10/20/2015   Anosmia 10/20/2015   Mild intermittent asthma 10/20/2015    Immunization History  Administered Date(s) Administered   Influenza, High Dose Seasonal PF 02/05/2017, 02/12/2018, 12/16/2018, 01/13/2020   Influenza,inj,quad, With Preservative 02/13/2017, 02/01/2018   Influenza-Unspecified 02/03/2015, 01/18/2018, 01/29/2018,  12/16/2018   PFIZER(Purple Top)SARS-COV-2 Vaccination 01/01/2019, 01/23/2019, 08/19/2019, 08/13/2020   Pneumococcal Conjugate-13 01/20/2014   Pneumococcal-Unspecified 02/14/2017   Tdap 12/13/2020   Zoster Recombinat (Shingrix) 10/18/2019, 03/05/2020    Conditions to be addressed/monitored:  {USCCMDZASSESSMENTOPTIONS:23563}  There are no care plans that you recently  modified to display for this patient.    Medication Assistance: {MEDASSISTANCEINFO:25044}  Compliance/Adherence/Medication fill history: Care Gaps: ***  Star-Rating Drugs: ***  Patient's preferred pharmacy is:  [id number] [place], [place] - [address] [place] [address]Phone: [phone]: [phone] Uses pill box? {Yes or If no, why not?:20788} Pt endorses ***% compliance  We discussed: {Pharmacy options:24294} Patient decided to: {US Pharmacy Plan:23885}  Care Plan and Follow Up Patient Decision:  {FOLLOWUP:24991}  Plan: {CM FOLLOW UP PLAN:25073}  ***

## 2021-01-19 ENCOUNTER — Encounter (HOSPITAL_BASED_OUTPATIENT_CLINIC_OR_DEPARTMENT_OTHER): Payer: Self-pay | Admitting: Cardiology

## 2021-01-26 DIAGNOSIS — M5442 Lumbago with sciatica, left side: Secondary | ICD-10-CM | POA: Diagnosis not present

## 2021-01-26 DIAGNOSIS — J309 Allergic rhinitis, unspecified: Secondary | ICD-10-CM | POA: Diagnosis not present

## 2021-01-26 DIAGNOSIS — I10 Essential (primary) hypertension: Secondary | ICD-10-CM | POA: Diagnosis not present

## 2021-01-26 DIAGNOSIS — M9903 Segmental and somatic dysfunction of lumbar region: Secondary | ICD-10-CM | POA: Diagnosis not present

## 2021-01-26 DIAGNOSIS — Z7984 Long term (current) use of oral hypoglycemic drugs: Secondary | ICD-10-CM | POA: Diagnosis not present

## 2021-01-26 DIAGNOSIS — Z008 Encounter for other general examination: Secondary | ICD-10-CM | POA: Diagnosis not present

## 2021-01-26 DIAGNOSIS — Z803 Family history of malignant neoplasm of breast: Secondary | ICD-10-CM | POA: Diagnosis not present

## 2021-01-26 DIAGNOSIS — Z880 Allergy status to penicillin: Secondary | ICD-10-CM | POA: Diagnosis not present

## 2021-01-26 DIAGNOSIS — L709 Acne, unspecified: Secondary | ICD-10-CM | POA: Diagnosis not present

## 2021-01-26 DIAGNOSIS — E119 Type 2 diabetes mellitus without complications: Secondary | ICD-10-CM | POA: Diagnosis not present

## 2021-01-26 DIAGNOSIS — Z6838 Body mass index (BMI) 38.0-38.9, adult: Secondary | ICD-10-CM | POA: Diagnosis not present

## 2021-01-26 DIAGNOSIS — M9901 Segmental and somatic dysfunction of cervical region: Secondary | ICD-10-CM | POA: Diagnosis not present

## 2021-01-26 DIAGNOSIS — M109 Gout, unspecified: Secondary | ICD-10-CM | POA: Diagnosis not present

## 2021-01-26 DIAGNOSIS — M9902 Segmental and somatic dysfunction of thoracic region: Secondary | ICD-10-CM | POA: Diagnosis not present

## 2021-01-27 ENCOUNTER — Ambulatory Visit (INDEPENDENT_AMBULATORY_CARE_PROVIDER_SITE_OTHER): Payer: Medicare HMO | Admitting: Nurse Practitioner

## 2021-01-27 ENCOUNTER — Encounter: Payer: Self-pay | Admitting: Nurse Practitioner

## 2021-01-27 ENCOUNTER — Other Ambulatory Visit: Payer: Self-pay

## 2021-01-27 VITALS — BP 122/80 | HR 94 | Temp 97.8°F | Ht 65.5 in | Wt 231.0 lb

## 2021-01-27 DIAGNOSIS — E7800 Pure hypercholesterolemia, unspecified: Secondary | ICD-10-CM | POA: Diagnosis not present

## 2021-01-27 DIAGNOSIS — E559 Vitamin D deficiency, unspecified: Secondary | ICD-10-CM

## 2021-01-27 DIAGNOSIS — Z0000 Encounter for general adult medical examination without abnormal findings: Secondary | ICD-10-CM | POA: Diagnosis not present

## 2021-01-27 DIAGNOSIS — I10 Essential (primary) hypertension: Secondary | ICD-10-CM

## 2021-01-27 DIAGNOSIS — H6121 Impacted cerumen, right ear: Secondary | ICD-10-CM | POA: Diagnosis not present

## 2021-01-27 DIAGNOSIS — E6609 Other obesity due to excess calories: Secondary | ICD-10-CM

## 2021-01-27 DIAGNOSIS — R7303 Prediabetes: Secondary | ICD-10-CM | POA: Diagnosis not present

## 2021-01-27 DIAGNOSIS — Z6837 Body mass index (BMI) 37.0-37.9, adult: Secondary | ICD-10-CM | POA: Diagnosis not present

## 2021-01-27 LAB — POCT URINALYSIS DIPSTICK
Bilirubin, UA: NEGATIVE
Blood, UA: NEGATIVE
Glucose, UA: NEGATIVE
Ketones, UA: NEGATIVE
Leukocytes, UA: NEGATIVE
Nitrite, UA: NEGATIVE
Protein, UA: NEGATIVE
Spec Grav, UA: 1.01 (ref 1.010–1.025)
Urobilinogen, UA: 0.2 E.U./dL
pH, UA: 6 (ref 5.0–8.0)

## 2021-01-27 LAB — POCT UA - MICROALBUMIN
Albumin/Creatinine Ratio, Urine, POC: 30
Creatinine, POC: 200 mg/dL
Microalbumin Ur, POC: 10 mg/L

## 2021-01-27 NOTE — Patient Instructions (Signed)
Health Maintenance, Female Adopting a healthy lifestyle and getting preventive care are important in promoting health and wellness. Ask your health care provider about: The right schedule for you to have regular tests and exams. Things you can do on your own to prevent diseases and keep yourself healthy. What should I know about diet, weight, and exercise? Eat a healthy diet  Eat a diet that includes plenty of vegetables, fruits, low-fat dairy products, and lean protein. Do not eat a lot of foods that are high in solid fats, added sugars, or sodium. Maintain a healthy weight Body mass index (BMI) is used to identify weight problems. It estimates body fat based on height and weight. Your health care provider can help determine your BMI and help you achieve or maintain a healthy weight. Get regular exercise Get regular exercise. This is one of the most important things you can do for your health. Most adults should: Exercise for at least 150 minutes each week. The exercise should increase your heart rate and make you sweat (moderate-intensity exercise). Do strengthening exercises at least twice a week. This is in addition to the moderate-intensity exercise. Spend less time sitting. Even light physical activity can be beneficial. Watch cholesterol and blood lipids Have your blood tested for lipids and cholesterol at 71 years of age, then have this test every 5 years. Have your cholesterol levels checked more often if: Your lipid or cholesterol levels are high. You are older than 71 years of age. You are at high risk for heart disease. What should I know about cancer screening? Depending on your health history and family history, you may need to have cancer screening at various ages. This may include screening for: Breast cancer. Cervical cancer. Colorectal cancer. Skin cancer. Lung cancer. What should I know about heart disease, diabetes, and high blood pressure? Blood pressure and heart  disease High blood pressure causes heart disease and increases the risk of stroke. This is more likely to develop in people who have high blood pressure readings, are of African descent, or are overweight. Have your blood pressure checked: Every 3-5 years if you are 18-39 years of age. Every year if you are 40 years old or older. Diabetes Have regular diabetes screenings. This checks your fasting blood sugar level. Have the screening done: Once every three years after age 40 if you are at a normal weight and have a low risk for diabetes. More often and at a younger age if you are overweight or have a high risk for diabetes. What should I know about preventing infection? Hepatitis B If you have a higher risk for hepatitis B, you should be screened for this virus. Talk with your health care provider to find out if you are at risk for hepatitis B infection. Hepatitis C Testing is recommended for: Everyone born from 1945 through 1965. Anyone with known risk factors for hepatitis C. Sexually transmitted infections (STIs) Get screened for STIs, including gonorrhea and chlamydia, if: You are sexually active and are younger than 71 years of age. You are older than 71 years of age and your health care provider tells you that you are at risk for this type of infection. Your sexual activity has changed since you were last screened, and you are at increased risk for chlamydia or gonorrhea. Ask your health care provider if you are at risk. Ask your health care provider about whether you are at high risk for HIV. Your health care provider may recommend a prescription medicine   to help prevent HIV infection. If you choose to take medicine to prevent HIV, you should first get tested for HIV. You should then be tested every 3 months for as long as you are taking the medicine. Pregnancy If you are about to stop having your period (premenopausal) and you may become pregnant, seek counseling before you get  pregnant. Take 400 to 800 micrograms (mcg) of folic acid every day if you become pregnant. Ask for birth control (contraception) if you want to prevent pregnancy. Osteoporosis and menopause Osteoporosis is a disease in which the bones lose minerals and strength with aging. This can result in bone fractures. If you are 65 years old or older, or if you are at risk for osteoporosis and fractures, ask your health care provider if you should: Be screened for bone loss. Take a calcium or vitamin D supplement to lower your risk of fractures. Be given hormone replacement therapy (HRT) to treat symptoms of menopause. Follow these instructions at home: Lifestyle Do not use any products that contain nicotine or tobacco, such as cigarettes, e-cigarettes, and chewing tobacco. If you need help quitting, ask your health care provider. Do not use street drugs. Do not share needles. Ask your health care provider for help if you need support or information about quitting drugs. Alcohol use Do not drink alcohol if: Your health care provider tells you not to drink. You are pregnant, may be pregnant, or are planning to become pregnant. If you drink alcohol: Limit how much you use to 0-1 drink a day. Limit intake if you are breastfeeding. Be aware of how much alcohol is in your drink. In the [place] one drink equals one 12 oz bottle of beer (355 mL), one 5 oz glass of wine (148 mL), or one 1 oz glass of hard liquor (44 mL). General instructions Schedule regular health, dental, and eye exams. Stay current with your vaccines. Tell your health care provider if: You often feel depressed. You have ever been abused or do not feel safe at home. Summary Adopting a healthy lifestyle and getting preventive care are important in promoting health and wellness. Follow your health care provider's instructions about healthy diet, exercising, and getting tested or screened for diseases. Follow your health care provider's  instructions on monitoring your cholesterol and blood pressure. This information is not intended to replace advice given to you by your health care provider. Make sure you discuss any questions you have with your health care provider. Document Revised: 06/25/2020 Document Reviewed: 04/10/2018 Elsevier Patient Education  2022 Elsevier Inc.  
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
## 2021-01-28 LAB — HEMOGLOBIN A1C
Est. average glucose Bld gHb Est-mCnc: 120 mg/dL
Hgb A1c MFr Bld: 5.8 % — ABNORMAL HIGH (ref 4.8–5.6)

## 2021-01-28 LAB — LIPID PANEL
Chol/HDL Ratio: 3.1 ratio (ref 0.0–4.4)
Cholesterol, Total: 172 mg/dL (ref 100–199)
HDL: 56 mg/dL (ref >39)
LDL Chol Calc (NIH): 102 mg/dL — ABNORMAL HIGH (ref 0–99)
Triglycerides: 72 mg/dL (ref 0–149)
VLDL Cholesterol Cal: 14 mg/dL (ref 5–40)

## 2021-01-28 LAB — VITAMIN D 25 HYDROXY (VIT D DEFICIENCY, FRACTURES): Vit D, 25-Hydroxy: 92.7 ng/mL (ref 30.0–100.0)

## 2021-01-31 ENCOUNTER — Telehealth: Payer: Medicare HMO

## 2021-01-31 ENCOUNTER — Ambulatory Visit (INDEPENDENT_AMBULATORY_CARE_PROVIDER_SITE_OTHER): Payer: Medicare HMO

## 2021-01-31 DIAGNOSIS — I10 Essential (primary) hypertension: Secondary | ICD-10-CM

## 2021-01-31 DIAGNOSIS — M19042 Primary osteoarthritis, left hand: Secondary | ICD-10-CM

## 2021-01-31 DIAGNOSIS — R7303 Prediabetes: Secondary | ICD-10-CM

## 2021-01-31 DIAGNOSIS — M1A09X0 Idiopathic chronic gout, multiple sites, without tophus (tophi): Secondary | ICD-10-CM

## 2021-01-31 DIAGNOSIS — M19041 Primary osteoarthritis, right hand: Secondary | ICD-10-CM

## 2021-01-31 DIAGNOSIS — E7800 Pure hypercholesterolemia, unspecified: Secondary | ICD-10-CM

## 2021-01-31 NOTE — Progress Notes (Signed)
This encounter was created in error - please disregard.

## 2021-02-01 ENCOUNTER — Telehealth: Payer: Medicare HMO

## 2021-02-09 ENCOUNTER — Other Ambulatory Visit: Payer: Self-pay | Admitting: Physician Assistant

## 2021-02-09 DIAGNOSIS — M9903 Segmental and somatic dysfunction of lumbar region: Secondary | ICD-10-CM | POA: Diagnosis not present

## 2021-02-09 DIAGNOSIS — M62830 Muscle spasm of back: Secondary | ICD-10-CM | POA: Diagnosis not present

## 2021-02-09 DIAGNOSIS — M5442 Lumbago with sciatica, left side: Secondary | ICD-10-CM | POA: Diagnosis not present

## 2021-02-09 DIAGNOSIS — M9901 Segmental and somatic dysfunction of cervical region: Secondary | ICD-10-CM | POA: Diagnosis not present

## 2021-02-09 DIAGNOSIS — M9902 Segmental and somatic dysfunction of thoracic region: Secondary | ICD-10-CM | POA: Diagnosis not present

## 2021-02-09 NOTE — Telephone Encounter (Signed)
Next Visit: 05/18/2021   Last Visit: 11/17/2020   Labs: 01/14/2021 CMP WNL.  CBC stable.  Uric acid is within the desirable range.    Current Dose per office note 11/17/2020: allopurinol 300 mg 1 tablet by mouth daily   DX: 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 tophus    Last Fill: 01/06/2021   Okay to refill Allopurinol?

## 2021-02-16 NOTE — Patient Instructions (Signed)
Visit Information   Consent to CCM Services: Ms. Speedy was given information about Chronic Care Management services including:  CCM service includes personalized support from designated clinical staff supervised by her physician, including individualized plan of care and coordination with other care providers 24/7 contact phone numbers for assistance for urgent and routine care needs. Service will only be billed when office clinical staff spend 20 minutes or more in a month to coordinate care. Only one practitioner may furnish and bill the service in a calendar month. The patient may stop CCM services at any time (effective at the end of the month) by phone call to the office staff. The patient will be responsible for cost sharing (co-pay) of up to 20% of the service fee (after annual deductible is met).  Patient agreed to services and verbal consent obtained.   The patient verbalized understanding of instructions, educational materials, and care plan provided today and declined offer to receive copy of patient instructions, educational materials, and care plan.   Telephone follow up appointment with care management team member scheduled for: 05/03/21  Barb Merino, RN, BSN, CCM Care Management Coordinator [facility]  Direct Phone: [phone]   Patient Care Plan: RN Care Manager Plan of Care     Problem Identified: Chronic disease education and Care Coordination needs for Essential HTN, Prediabetes, Pure Hypercholesterolemia, Idiopathic chronic gout, Primary Osteoarthritis   Priority: High     Long-Range Goal: Assist with Chronic disease education and Care Coordination needs for Essential HTN, Prediabetes, Pure Hypercholesterolemia, Idiopathic chronic gout, Primary Osteoarthritis   Start Date: 01/31/2021  Expected End Date: 01/31/2022  This Visit's Progress: On track  Priority: High  Note:   Current Barriers:  Knowledge Deficits related to  plan of care for management of Essential HTN, Prediabetes, Pure Hypercholesterolemia, Idiopathic chronic gout, Primary Osteoarthritis Chronic Disease Management support and education needs related to Essential HTN, Prediabetes, Pure Hypercholesterolemia, Idiopathic chronic gout, Primary Osteoarthritis  RNCM Clinical Goal(s):  Patient will demonstrate Ongoing health management independence Essential HTN, Prediabetes, Pure Hypercholesterolemia, Idiopathic chronic gout, Primary Osteoarthritis continue to work with RN Care Manager to address care management and care coordination needs related to  HTN, DMII, and Osteoarthritis through collaboration with RN Care manager, provider, and care team.   Interventions: 1:1 collaboration with primary care provider regarding development and update of comprehensive plan of care as evidenced by provider attestation and co-signature Inter-disciplinary care team collaboration (see longitudinal plan of care) Evaluation of current treatment plan related to  self management and patient's adherence to plan as established by provider  Hypertension Interventions: Last practice recorded BP readings:  BP Readings from Last 3 Encounters:  01/27/21 122/80  12/30/20 126/68  11/17/20 122/81  Most recent eGFR/CrCl:  Lab Results  Component Value Date   EGFR 85 01/14/2021    No components found for: CRCL  Evaluation of current treatment plan related to hypertension self management and patient's adherence to plan as established by provider; Provided education to patient re: stroke prevention, s/s of heart attack and stroke; Reviewed medications with patient and discussed importance of compliance; Discussed plans with patient for ongoing care management follow up and provided patient with direct contact information for care management team; Advised patient, providing education and rationale, to monitor blood pressure daily and record, calling PCP for findings outside  established parameters;  Provided education on prescribed diet Low Sodium;  Instructed patient on how to accurately monitor BP at home  Goal on track:  Yes  Evaluation of current treatment plan related to Osteoarthritis, self-management and patient's adherence to plan as established by provider. Discussed plans with patient for ongoing care management follow up and provided patient with direct contact information for care management team Provided education to patient re: benefits from adhering to routine exercise regimen to help maintain strengthening, endurance and to help manage chronic pain; Discussed plans with patient for ongoing care management follow up and provided patient with direct contact information for care management team;  Diabetes Interventions: Assessed patient's understanding of A1c goal:  <5.7 Reviewed medications with patient and discussed importance of medication adherence; Discussed plans with patient for ongoing care management follow up and provided patient with direct contact information for care management team; Educated on dietary and exercise recommendations  Lab Results  Component Value Date   HGBA1C 5.8 (H) 01/27/2021   Patient Goals/Self-Care Activities: Patient will self administer medications as prescribed Patient will attend all scheduled provider appointments Patient will call pharmacy for medication refills Patient will call provider office for new concerns or questions  Follow Up Plan:  Telephone follow up appointment with care management team member scheduled for:  05/03/21

## 2021-02-16 NOTE — Chronic Care Management (AMB) (Signed)
Chronic Care Management   CCM RN Visit Note  01/31/2021 Name: Robin Odom MRN: [id number] DOB: 03-Mar-1951  Subjective: Robin Odom is a 70 y.o. year old female who is a primary care patient of Glendale Chard, MD. The care management team was consulted for assistance with disease management and care coordination needs.    Engaged with patient by telephone for follow up visit in response to provider referral for case management and/or care coordination services.   Consent to Services:  The patient was given information about Chronic Care Management services, agreed to services, and gave verbal consent prior to initiation of services.  Please see initial visit note for detailed documentation.   Patient agreed to services and verbal consent obtained.   Assessment: Review of patient past medical history, allergies, medications, health status, including review of consultants reports, laboratory and other test data, was performed as part of comprehensive evaluation and provision of chronic care management services.   SDOH (Social Determinants of Health) assessments and interventions performed:    CCM Care Plan  Allergies  Allergen Reactions   Codeine Other (See Comments)    Hallucinations   Penicillins Hives    Outpatient Encounter Medications as of 01/31/2021  Medication Sig   amLODipine (NORVASC) 10 MG tablet Take 1 tablet (10 mg total) by mouth daily.   azelastine (ASTELIN) 0.1 % nasal spray 1-2 sprays each nostril twice daily as needed   B Complex Vitamins (VITAMIN-B COMPLEX) TABS Take by mouth.   BENFOTIAMINE PO Take by mouth 2 (two) times daily.   calcium carbonate (OS-CAL) 600 MG TABS tablet Take 600 mg by mouth daily.   Cholecalciferol (VITAMIN D3) 125 MCG (5000 UT) CAPS Take by mouth.    colchicine 0.6 MG tablet Take 0.6 mg by mouth as needed.   furosemide (LASIX) 20 MG tablet TAKE 1 TABLET BY MOUTH EVERY DAY   hydrALAZINE (APRESOLINE) 25 MG tablet TAKE 1 TABLET BY MOUTH TWICE A  DAY   magnesium oxide (MAG-OX) 400 MG tablet Take 400 mg by mouth daily.   metoprolol succinate (TOPROL-XL) 50 MG 24 hr tablet TAKE 1 TABLET BY MOUTH DAILY. TAKE WITH OR IMMEDIATELY FOLLOWING A MEAL.   Multiple Vitamin (MULTIVITAMIN+) LIQD Take by mouth.   olmesartan (BENICAR) 40 MG tablet TAKE 1 TABLET BY MOUTH EVERY DAY   Omega-3 Fatty Acids (OMEGA 3 PO) Take 2,500 mg by mouth daily. 2 tablets by mouth daily   potassium chloride (MICRO-K) 10 MEQ CR capsule TAKE 1 CAPSULE BY MOUTH EVERY DAY   Semaglutide (RYBELSUS) 3 MG TABS Take one tablet by mouth 57mns before breakfast with 4oz water.   TURMERIC CURCUMIN PO Take by mouth daily.   Zinc 50 MG TABS Take 1 tablet by mouth daily.   [DISCONTINUED] allopurinol (ZYLOPRIM) 300 MG tablet TAKE 1 TABLET BY MOUTH EVERY DAY   No facility-administered encounter medications on file as of 01/31/2021.    Patient Active Problem List   Diagnosis Date Noted   History of wheezing 03/18/2020   Osteopenia of multiple sites 10/30/2019   Prediabetes 05/14/2018   Essential hypertension, benign 05/14/2018   Class 3 severe obesity due to excess calories with serious comorbidity and body mass index (BMI) of 40.0 to 44.9 in adult ([facility] 05/14/2018   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 tophus 07/20/2016   Primary osteoarthritis of both hands 07/20/2016   Primary osteoarthritis of both knees 07/20/2016   Primary osteoarthritis of both feet 07/20/2016   DJD (degenerative joint disease), cervical 07/20/2016  Elevated CK 07/20/2016   History of humerus fracture 07/20/2016   History of hypertension 07/20/2016   History of diabetes mellitus 07/20/2016   History of hyperlipidemia 07/20/2016   Chronic rhinitis 10/20/2015   Anosmia 10/20/2015   Mild intermittent asthma 10/20/2015    Conditions to be addressed/monitored: Essential HTN, Prediabetes, Pure Hypercholesterolemia, Idiopathic chronic gout, Primary Osteoarthritis  Care Plan : RN Care Manager  Plan of Care  Updates made by Lynne Logan, RN since 02/16/2021 12:00 AM     Problem: Chronic disease education and Care Coordination needs for Essential HTN, Prediabetes, Pure Hypercholesterolemia, Idiopathic chronic gout, Primary Osteoarthritis   Priority: High     Long-Range Goal: Assist with Chronic disease education and Care Coordination needs for Essential HTN, Prediabetes, Pure Hypercholesterolemia, Idiopathic chronic gout, Primary Osteoarthritis   Start Date: 01/31/2021  Expected End Date: 01/31/2022  This Visit's Progress: On track  Priority: High  Note:   Current Barriers:  Knowledge Deficits related to plan of care for management of Essential HTN, Prediabetes, Pure Hypercholesterolemia, Idiopathic chronic gout, Primary Osteoarthritis Chronic Disease Management support and education needs related to Essential HTN, Prediabetes, Pure Hypercholesterolemia, Idiopathic chronic gout, Primary Osteoarthritis  RNCM Clinical Goal(s):  Patient will demonstrate Ongoing health management independence Essential HTN, Prediabetes, Pure Hypercholesterolemia, Idiopathic chronic gout, Primary Osteoarthritis continue to work with RN Care Manager to address care management and care coordination needs related to  HTN, DMII, and Osteoarthritis through collaboration with RN Care manager, provider, and care team.   Interventions: 1:1 collaboration with primary care provider regarding development and update of comprehensive plan of care as evidenced by provider attestation and co-signature Inter-disciplinary care team collaboration (see longitudinal plan of care) Evaluation of current treatment plan related to  self management and patient's adherence to plan as established by provider  Hypertension Interventions: Last practice recorded BP readings:  BP Readings from Last 3 Encounters:  01/27/21 122/80  12/30/20 126/68  11/17/20 122/81  Most recent eGFR/CrCl:  Lab Results  Component Value Date    EGFR 85 01/14/2021    No components found for: CRCL  Evaluation of current treatment plan related to hypertension self management and patient's adherence to plan as established by provider; Provided education to patient re: stroke prevention, s/s of heart attack and stroke; Reviewed medications with patient and discussed importance of compliance; Discussed plans with patient for ongoing care management follow up and provided patient with direct contact information for care management team; Advised patient, providing education and rationale, to monitor blood pressure daily and record, calling PCP for findings outside established parameters;  Provided education on prescribed diet Low Sodium;  Instructed patient on how to accurately monitor BP at home  Goal on track:  Yes Evaluation of current treatment plan related to Osteoarthritis, self-management and patient's adherence to plan as established by provider. Discussed plans with patient for ongoing care management follow up and provided patient with direct contact information for care management team Provided education to patient re: benefits from adhering to routine exercise regimen to help maintain strengthening, endurance and to help manage chronic pain; Discussed plans with patient for ongoing care management follow up and provided patient with direct contact information for care management team;  Diabetes Interventions: Assessed patient's understanding of A1c goal:  <5.7 Reviewed medications with patient and discussed importance of medication adherence; Discussed plans with patient for ongoing care management follow up and provided patient with direct contact information for care management team; Educated on dietary and exercise recommendations  Lab Results  Component Value Date   HGBA1C 5.8 (H) 01/27/2021   Patient Goals/Self-Care Activities: Patient will self administer medications as prescribed Patient will attend all scheduled  provider appointments Patient will call pharmacy for medication refills Patient will call provider office for new concerns or questions  Follow Up Plan:  Telephone follow up appointment with care management team member scheduled for:  05/03/21      Plan:Telephone follow up appointment with care management team member scheduled for:  05/03/21  Barb Merino, RN, BSN, CCM Care Management Coordinator [facility]  Direct Phone: [phone]

## 2021-02-22 ENCOUNTER — Ambulatory Visit
Admission: RE | Admit: 2021-02-22 | Discharge: 2021-02-22 | Disposition: A | Discharge status: Home / Self Care | Payer: Medicare HMO | Source: Ambulatory Visit | Attending: Internal Medicine | Admitting: Internal Medicine

## 2021-02-22 ENCOUNTER — Other Ambulatory Visit: Payer: Self-pay

## 2021-02-22 ENCOUNTER — Ambulatory Visit: Payer: Medicare HMO

## 2021-02-22 DIAGNOSIS — Z1231 Encounter for screening mammogram for malignant neoplasm of breast: Secondary | ICD-10-CM

## 2021-02-22 DIAGNOSIS — E782 Mixed hyperlipidemia: Secondary | ICD-10-CM

## 2021-02-22 DIAGNOSIS — I10 Essential (primary) hypertension: Secondary | ICD-10-CM

## 2021-02-22 NOTE — Progress Notes (Signed)
Chronic Care Management Pharmacy Note  02/22/2021 Name:  Robin Odom MRN:  [id number] DOB:  1950/11/20  Summary: Patient reports that she is doing well she recently had COVID but she is feeling much better.   Recommendations/Changes made from today's visit: Recommend  patient receive COVID-19 booster vaccine. Recommend patient receive pneumonia vaccine.   Plan: Patient to receive COVID-19 booster on Friday. Update patients chart to include patients foot exams, and eye exams.    Subjective: Robin Odom is an 70 y.o. year old female who is a primary patient of Glendale Chard, MD.  The CCM team was consulted for assistance with disease management and care coordination needs.    Engaged with patient by telephone for follow up visit in response to provider referral for pharmacy case management and/or care coordination services.   Consent to Services:  The patient was given the following information about Chronic Care Management services today, agreed to services, and gave verbal consent: 1. CCM service includes personalized support from designated clinical staff supervised by the primary care provider, including individualized plan of care and coordination with other care providers 2. 24/7 contact phone numbers for assistance for urgent and routine care needs. 3. Service will only be billed when office clinical staff spend 20 minutes or more in a month to coordinate care. 4. Only one practitioner may furnish and bill the service in a calendar month. 5.The patient may stop CCM services at any time (effective at the end of the month) by phone call to the office staff. 6. The patient will be responsible for cost sharing (co-pay) of up to 20% of the service fee (after annual deductible is met). Patient agreed to services and consent obtained.  Patient Care Team: Glendale Chard, MD as PCP - General (Internal Medicine) Buford Dresser, MD as PCP - Cardiology (Cardiology) Rex Kras Claudette Stapler, RN  as [facility] Mayford Knife, [facility] (Pharmacist)  Recent office visits: 01/27/2021 PCP OV  Recent consult visits: 12/30/2020 Cardiology OV 11/17/2020 Rheumatology OV 11/02/2020 Podiatry OV 09/29/2020 Cardiology [facility] visits: None in previous 6 months   Objective:  Lab Results  Component Value Date   CREATININE 0.75 01/14/2021   BUN 19 01/14/2021   GFRNONAA 67 05/13/2020   GFRAA 77 05/13/2020   NA 144 01/14/2021   K 4.1 01/14/2021   CALCIUM 9.7 01/14/2021   CO2 32 01/14/2021   GLUCOSE 88 01/14/2021    Lab Results  Component Value Date/Time   HGBA1C 5.8 (H) 01/27/2021 12:29 PM   HGBA1C 5.9 (H) 09/09/2020 03:49 PM   MICROALBUR 10 01/27/2021 11:44 AM   MICROALBUR 10 05/13/2020 11:14 AM    Last diabetic Eye exam: No results found for: HMDIABEYEEXA  Last diabetic Foot exam: No results found for: HMDIABFOOTEX   Lab Results  Component Value Date   CHOL 172 01/27/2021   HDL 56 01/27/2021   LDLCALC 102 (H) 01/27/2021   TRIG 72 01/27/2021   CHOLHDL 3.1 01/27/2021    Hepatic Function Latest Ref Rng & Units 01/14/2021 07/08/2020 02/04/2020  Total Protein 6.1 - 8.1 g/dL 7.0 7.0 6.5  Albumin 3.8 - 4.8 g/dL - 4.4 -  AST 10 - 35 U/L 18 21 17   ALT 6 - 29 U/L 12 16 13   Alk Phosphatase 44 - 121 IU/L - 71 -  Total Bilirubin 0.2 - 1.2 mg/dL 0.5 0.3 0.3    No results found for: TSH, FREET4  CBC Latest Ref Rng & Units 01/14/2021  07/08/2020 02/04/2020  WBC 3.8 - 10.8 Thousand/uL 9.4 10.5 10.4  Hemoglobin 11.7 - 15.5 g/dL 13.1 13.0 12.7  Hematocrit 35.0 - 45.0 % 41.8 40.8 38.6  Platelets 140 - 400 Thousand/uL 309 340 316    Lab Results  Component Value Date/Time   VD25OH 92.7 01/27/2021 12:29 PM    Clinical ASCVD: No  The 10-year ASCVD risk score (Arnett DK, et al., 2019) is: 22.6%   Values used to calculate the score:     Age: 43 years     Sex: Female     Is Non-Hispanic African American: Yes     Diabetic: Yes     Tobacco smoker: No      Systolic Blood Pressure: 161 mmHg     Is BP treated: Yes     HDL Cholesterol: 56 mg/dL     Total Cholesterol: 172 mg/dL    Depression screen [facility] 2/9 05/13/2020 04/16/2019 07/11/2018  Decreased Interest 0 0 0  Down, Depressed, Hopeless 0 0 0  PHQ - 2 Score 0 0 0  Altered sleeping - 3 -  Tired, decreased energy - 0 -  Change in appetite - 0 -  Feeling bad or failure about yourself  - 0 -  Trouble concentrating - 0 -  Moving slowly or fidgety/restless - 0 -  Suicidal thoughts - 0 -  PHQ-9 Score - 3 -  Difficult doing work/chores - Not difficult at all -     Social History   Tobacco Use  Smoking Status Never  Smokeless Tobacco Never   BP Readings from Last 3 Encounters:  01/27/21 122/80  12/30/20 126/68  11/17/20 122/81   Pulse Readings from Last 3 Encounters:  01/27/21 94  12/30/20 69  11/17/20 62   Wt Readings from Last 3 Encounters:  01/27/21 231 lb (104.8 kg)  12/30/20 232 lb (105.2 kg)  11/17/20 236 lb (107 kg)   BMI Readings from Last 3 Encounters:  01/27/21 37.86 kg/m  12/30/20 38.02 kg/m  11/17/20 39.27 kg/m    Assessment/Interventions: Review of patient past medical history, allergies, medications, health status, including review of consultants reports, laboratory and other test data, was performed as part of comprehensive evaluation and provision of chronic care management services.   SDOH:  (Social Determinants of Health) assessments and interventions performed: No  SDOH Screenings   Alcohol Screen: Not on file  Depression (PHQ2-9): Low Risk    PHQ-2 Score: 0  Financial Resource Strain: Medium Risk   Difficulty of Paying Living Expenses: Somewhat hard  Food Insecurity: No Food Insecurity   Worried About Charity fundraiser in the Last Year: Never true   Ran Out of Food in the Last Year: Never true  Housing: Low Risk    Last Housing Risk Score: 0  Physical Activity: Sufficiently Active   Days of Exercise per Week: 5 days   Minutes of Exercise  per Session: 30 min  Social Connections: Not on file  Stress: No Stress Concern Present   Feeling of Stress : Only a little  Tobacco Use: Low Risk    Smoking Tobacco Use: Never   Smokeless Tobacco Use: Never   Passive Exposure: Not on file  Transportation Needs: No Transportation Needs   Lack of Transportation (Medical): No   Lack of Transportation (Non-Medical): No    CCM Care Plan  Allergies  Allergen Reactions   Codeine Other (See Comments)    Hallucinations   Penicillins Hives    Medications Reviewed Today  Reviewed by Mayford Knife, RPH (Pharmacist) on 02/22/21 at 1021  Med List Status: <None>   Medication Order Taking? Sig Documenting Provider Last Dose Status Informant  allopurinol (ZYLOPRIM) 300 MG tablet [id number]  TAKE 1 TABLET BY MOUTH EVERY DAY Deveshwar, Shaili, MD  Active   amLODipine (NORVASC) 10 MG tablet [id number] No Take 1 tablet (10 mg total) by mouth daily. Glendale Chard, MD Taking Active   azelastine (ASTELIN) 0.1 % nasal spray [id number] No 1-2 sprays each nostril twice daily as needed Bobbitt, [place] Muta, MD Taking Active   B Complex Vitamins (VITAMIN-B COMPLEX) TABS [id number] No Take by mouth. [provider] Taking Active            Med Note Iona Beard, STEPHANIE A   Mon Aug 02, 2020  2:55 PM)    BENFOTIAMINE PO [id number] No Take by mouth 2 (two) times daily. [provider] Taking Active   calcium carbonate (OS-CAL) 600 MG TABS tablet [id number] No Take 600 mg by mouth daily. [provider] Taking Active   Cholecalciferol (VITAMIN D3) 125 MCG (5000 UT) CAPS [id number] No Take by mouth.  [provider] Taking Active            Med Note Iona Beard, STEPHANIE A   Mon Aug 02, 2020  2:55 PM)    colchicine 0.6 MG tablet [id number] No Take 0.6 mg by mouth as needed. [provider] Taking Active   furosemide (LASIX) 20 MG tablet [id number] No TAKE 1 TABLET BY MOUTH EVERY DAY Glendale Chard, MD Taking Active    hydrALAZINE (APRESOLINE) 25 MG tablet [id number] No TAKE 1 TABLET BY MOUTH TWICE A Lynnell Dike, MD Taking Active   magnesium oxide (MAG-OX) 400 MG tablet [id number] No Take 400 mg by mouth daily. [provider] Taking Active   metoprolol succinate (TOPROL-XL) 50 MG 24 hr tablet [id number] No TAKE 1 TABLET BY MOUTH DAILY. TAKE WITH OR IMMEDIATELY FOLLOWING A MEAL. Glendale Chard, MD Taking Active   Discontinued 02/22/21 1021 (Change in therapy)   olmesartan (BENICAR) 40 MG tablet [id number] No TAKE 1 TABLET BY MOUTH EVERY DAY Glendale Chard, MD Taking Active   Omega-3 Fatty Acids (OMEGA 3 PO) [id number] No Take 2,500 mg by mouth daily. 2 tablets by mouth daily [provider] Taking Active   potassium chloride (MICRO-K) 10 MEQ CR capsule [id number] No TAKE 1 CAPSULE BY MOUTH EVERY DAY Glendale Chard, MD Taking Active   Semaglutide (RYBELSUS) 3 MG TABS [id number] No Take one tablet by mouth 60mns before breakfast with 4oz water. SGlendale Chard MD Taking Active   TURMERIC CURCUMIN PO [id number] Take by mouth daily. [provider] Taking Active   Zinc 50 MG TABS [id number] Take 1 tablet by mouth daily. [provider] Taking Active Self            Patient Active Problem List   Diagnosis Date Noted   History of wheezing 03/18/2020   Osteopenia of multiple sites 10/30/2019   Prediabetes 05/14/2018   Essential hypertension, benign 05/14/2018   Class 3 severe obesity due to excess calories with serious comorbidity and body mass index (BMI) of 40.0 to 44.9 in adult ([facility] 05/14/2018   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 tophus 07/20/2016   Primary osteoarthritis of both hands 07/20/2016   Primary osteoarthritis of both knees 07/20/2016   Primary osteoarthritis of both feet 07/20/2016   DJD (degenerative joint disease), cervical 07/20/2016   Elevated CK 07/20/2016   History  of humerus fracture 07/20/2016   History of hypertension  07/20/2016   History of diabetes mellitus 07/20/2016   History of hyperlipidemia 07/20/2016   Chronic rhinitis 10/20/2015   Anosmia 10/20/2015   Mild intermittent asthma 10/20/2015    Immunization History  Administered Date(s) Administered   Influenza, High Dose Seasonal PF 02/05/2017, 02/12/2018, 12/16/2018, 01/13/2020, 01/18/2021   Influenza,inj,quad, With Preservative 02/13/2017, 02/01/2018   Influenza-Unspecified 02/03/2015, 01/18/2018, 01/29/2018, 12/16/2018   PFIZER(Purple Top)SARS-COV-2 Vaccination 01/01/2019, 01/23/2019, 08/19/2019, 08/13/2020   Pneumococcal Conjugate-13 01/20/2014   Pneumococcal-Unspecified 02/14/2017   Tdap 12/13/2020   Zoster Recombinat (Shingrix) 10/18/2019, 03/05/2020    Conditions to be addressed/monitored:  Hyperlipidemia and Diabetes  Care Plan : [place]  Updates made by Mayford Knife, [place] since 02/22/2021 12:00 AM     Problem: HTN, HLD   Priority: High     Long-Range Goal: Disease Management   Start Date: 10/20/2020  Recent Progress: On track  Priority: High  Note:   Current Barriers:  Unable to achieve control of cholesterol   Pharmacist Clinical Goal(s):  Patient will achieve adherence to monitoring guidelines and medication adherence to achieve therapeutic efficacy achieve control of cholesterol as evidenced by LDL<70 through collaboration with PharmD and provider.   Interventions: 1:1 collaboration with Glendale Chard, MD regarding development and update of comprehensive plan of care as evidenced by provider attestation and co-signature Inter-disciplinary care team collaboration (see longitudinal plan of care) Comprehensive medication review performed; medication list updated in electronic medical record  Hypertension (BP goal <130/80) -Controlled -Current treatment: Hydralazine 25 mg tablet taking 1 tablet by mouth twice per day Olmesartan 40 mg tablet once per day  Amlodipine 10 mg tablet once per  day Metoprolol succinate 50 mg tablet once per day -Current home readings: 117/70 -Current dietary habits: patient reports that she is not eating any salt or using salt for her food.  -[facility] twice a week. -Denies hypotensive/hypertensive symptoms -Educated on Exercise goal of 150 minutes per week; Importance of home blood pressure monitoring; -Counseled to monitor BP at home at least three times per week, document, and provide log at future appointments -Counseled on diet and exercise extensively Recommended to continue current medication   Hyperlipidemia: (LDL goal < 70) -Uncontrolled -Current treatment: patient is not currently taking any medication  -Current dietary patterns: patient reports avoiding fried and fatty foods  -Current exercise habits: [facility] and she [facility] at another location twice a week. -Ms. Pitner has a goal to lose an additional 5-10 pounds -Educated on Cholesterol goals;  Benefits of statin for ASCVD risk reduction; -Counseled on diet and exercise extensively   Patient Goals/Self-Care Activities Patient will:  - take medications as prescribed  Follow Up Plan: The patient has been provided with contact information for the care management team and has been advised to call with any health related questions or concerns.       Medication Assistance: None required.  Patient affirms current coverage meets needs.  Compliance/Adherence/Medication fill history: Care Gaps: Foot Exam- Podiatry in April  COVID-19 Booster scheduled for 02/25/2021 Opthalmology Exam- quarterly 10/2020   Star-Rating Drugs: Olmesartan 40 mg tablet  Rybelsus 3 mg tablet   Patient's preferred pharmacy is:  [id number] [place], [place] - [address] [place] [address]Phone: [phone]: [phone] Uses pill  box? Yes Pt  endorses 95% compliance  We discussed: Benefits of medication synchronization, packaging and delivery as well as enhanced pharmacist oversight with [employer]. Patient decided to: Continue current medication management strategy  Care Plan and Follow Up Patient Decision:  Patient agrees to Care Plan and Follow-up.  Plan: The patient has been provided with contact information for the care management team and has been advised to call with any health related questions or concerns.   Orlando Penner, PharmD Clinical Pharmacist [facility] [phone]

## 2021-02-22 NOTE — Patient Instructions (Signed)
Visit Information It was great speaking with you today!  Please let me know if you have any questions about our visit.   Goals Addressed   None     Patient Care Plan: CCM Pharmacy Care Plan     Problem Identified: HTN, HLD   Priority: High     Long-Range Goal: Disease Management   Start Date: 10/20/2020  Recent Progress: On track  Priority: High  Note:   Current Barriers:  Unable to achieve control of cholesterol   Pharmacist Clinical Goal(s):  Patient will achieve adherence to monitoring guidelines and medication adherence to achieve therapeutic efficacy achieve control of cholesterol as evidenced by LDL<70 through collaboration with PharmD and provider.   Interventions: 1:1 collaboration with Glendale Chard, MD regarding development and update of comprehensive plan of care as evidenced by provider attestation and co-signature Inter-disciplinary care team collaboration (see longitudinal plan of care) Comprehensive medication review performed; medication list updated in electronic medical record  Hypertension (BP goal <130/80) -Controlled -Current treatment: Hydralazine 25 mg tablet taking 1 tablet by mouth twice per day Olmesartan 40 mg tablet once per day  Amlodipine 10 mg tablet once per day Metoprolol succinate 50 mg tablet once per day -Current home readings: 117/70 -Current dietary habits: patient reports that she is not eating any salt or using salt for her food.  -Current exercise habits: [facility] and she exercises at another location twice a week. -Denies hypotensive/hypertensive symptoms -Educated on Exercise goal of 150 minutes per week; Importance of home blood pressure monitoring; -Counseled to monitor BP at home at least three times per week, document, and provide log at future appointments -Counseled on diet and exercise extensively Recommended to continue current medication   Hyperlipidemia: (LDL goal < 70) -Uncontrolled -Current  treatment: patient is not currently taking any medication  -Current dietary patterns: patient reports avoiding fried and fatty foods  -Current exercise habits: [facility] and she exercises at another location twice a week. -Ms. [place] has a goal to lose an additional 5-10 pounds -Educated on Cholesterol goals;  Benefits of statin for ASCVD risk reduction; -Counseled on diet and exercise extensively   Patient Goals/Self-Care Activities Patient will:  - take medications as prescribed  Follow Up Plan: The patient has been provided with contact information for the care management team and has been advised to call with any health related questions or concerns.        Patient agreed to services and verbal consent obtained.   The patient verbalized understanding of instructions, educational materials, and care plan provided today and agreed to receive a mailed copy of patient instructions, educational materials, and care plan.   Orlando Penner, PharmD Clinical Pharmacist [facility] [phone]

## 2021-02-24 ENCOUNTER — Telehealth: Payer: Self-pay

## 2021-02-24 MED ORDER — RYBELSUS 3 MG PO TABS: ORAL_TABLET | ORAL | 1 refills | Status: DC

## 2021-02-24 NOTE — Telephone Encounter (Signed)
Rybelsus faxed to [facility]

## 2021-02-28 DIAGNOSIS — M19042 Primary osteoarthritis, left hand: Secondary | ICD-10-CM | POA: Diagnosis not present

## 2021-02-28 DIAGNOSIS — I10 Essential (primary) hypertension: Secondary | ICD-10-CM

## 2021-02-28 DIAGNOSIS — M19041 Primary osteoarthritis, right hand: Secondary | ICD-10-CM | POA: Diagnosis not present

## 2021-02-28 DIAGNOSIS — E7800 Pure hypercholesterolemia, unspecified: Secondary | ICD-10-CM

## 2021-02-28 DIAGNOSIS — E782 Mixed hyperlipidemia: Secondary | ICD-10-CM

## 2021-03-03 ENCOUNTER — Telehealth: Payer: Self-pay

## 2021-03-03 NOTE — Chronic Care Management (AMB) (Signed)
    Chronic Care Management Pharmacy Assistant   Name: Robin Odom  MRN: [id number] DOB: 03-06-1951   Reason for Encounter: Medication adherence     Medications: Outpatient Encounter Medications as of 03/03/2021  Medication Sig   allopurinol (ZYLOPRIM) 300 MG tablet TAKE 1 TABLET BY MOUTH EVERY DAY   amLODipine (NORVASC) 10 MG tablet Take 1 tablet (10 mg total) by mouth daily.   azelastine (ASTELIN) 0.1 % nasal spray 1-2 sprays each nostril twice daily as needed   B Complex Vitamins (VITAMIN-B COMPLEX) TABS Take by mouth.   BENFOTIAMINE PO Take by mouth 2 (two) times daily.   calcium carbonate (OS-CAL) 600 MG TABS tablet Take 600 mg by mouth daily.   Cholecalciferol (VITAMIN D3) 125 MCG (5000 UT) CAPS Take by mouth.    colchicine 0.6 MG tablet Take 0.6 mg by mouth as needed.   furosemide (LASIX) 20 MG tablet TAKE 1 TABLET BY MOUTH EVERY DAY   hydrALAZINE (APRESOLINE) 25 MG tablet TAKE 1 TABLET BY MOUTH TWICE A DAY   magnesium oxide (MAG-OX) 400 MG tablet Take 400 mg by mouth daily.   metoprolol succinate (TOPROL-XL) 50 MG 24 hr tablet TAKE 1 TABLET BY MOUTH DAILY. TAKE WITH OR IMMEDIATELY FOLLOWING A MEAL.   olmesartan (BENICAR) 40 MG tablet TAKE 1 TABLET BY MOUTH EVERY DAY   Omega-3 Fatty Acids (OMEGA 3 PO) Take 2,500 mg by mouth daily. 2 tablets by mouth daily   potassium chloride (MICRO-K) 10 MEQ CR capsule TAKE 1 CAPSULE BY MOUTH EVERY DAY   Semaglutide (RYBELSUS) 3 MG TABS Take one tablet by mouth 14mins before breakfast with 4oz water.   TURMERIC CURCUMIN PO Take by mouth daily.   Zinc 50 MG TABS Take 1 tablet by mouth daily.   No facility-administered encounter medications on file as of 03/03/2021.   03-03-2021: Completed medication adherence for Olmesartan 40 mg. Medication was filled on 01-29-2021 for a 90 day supply.   [place] Pharmacist Assistant [phone]

## 2021-03-09 ENCOUNTER — Other Ambulatory Visit: Payer: Self-pay | Admitting: Internal Medicine

## 2021-03-14 DIAGNOSIS — H34832 Tributary (branch) retinal vein occlusion, left eye, with macular edema: Secondary | ICD-10-CM | POA: Diagnosis not present

## 2021-03-14 DIAGNOSIS — H35033 Hypertensive retinopathy, bilateral: Secondary | ICD-10-CM | POA: Diagnosis not present

## 2021-03-14 DIAGNOSIS — H43813 Vitreous degeneration, bilateral: Secondary | ICD-10-CM | POA: Diagnosis not present

## 2021-04-04 DIAGNOSIS — G4733 Obstructive sleep apnea (adult) (pediatric): Secondary | ICD-10-CM | POA: Diagnosis not present

## 2021-04-09 ENCOUNTER — Other Ambulatory Visit: Payer: Self-pay | Admitting: Internal Medicine

## 2021-04-14 DIAGNOSIS — H52223 Regular astigmatism, bilateral: Secondary | ICD-10-CM | POA: Diagnosis not present

## 2021-04-23 ENCOUNTER — Other Ambulatory Visit: Payer: Self-pay | Admitting: Internal Medicine

## 2021-04-26 ENCOUNTER — Telehealth: Payer: Self-pay

## 2021-04-26 NOTE — Progress Notes (Signed)
° ° °  Chronic Care Management Pharmacy Assistant   Name: Robin Odom  MRN: [id number] DOB: 1951/03/02   Reason for Encounter: Disease State/General Adherence Call    Recent office visits:   03/03/2021 [place] (CCM) Medication Adherence   Recent consult visits: None  Hospital visits:  None in previous 6 months  Medications: Outpatient Encounter Medications as of 04/26/2021  Medication Sig   allopurinol (ZYLOPRIM) 300 MG tablet TAKE 1 TABLET BY MOUTH EVERY DAY   amLODipine (NORVASC) 10 MG tablet Take 1 tablet (10 mg total) by mouth daily.   azelastine (ASTELIN) 0.1 % nasal spray 1-2 sprays each nostril twice daily as needed   B Complex Vitamins (VITAMIN-B COMPLEX) TABS Take by mouth.   BENFOTIAMINE PO Take by mouth 2 (two) times daily.   calcium carbonate (OS-CAL) 600 MG TABS tablet Take 600 mg by mouth daily.   Cholecalciferol (VITAMIN D3) 125 MCG (5000 UT) CAPS Take by mouth.    colchicine 0.6 MG tablet Take 0.6 mg by mouth as needed.   furosemide (LASIX) 20 MG tablet TAKE 1 TABLET BY MOUTH EVERY DAY   hydrALAZINE (APRESOLINE) 25 MG tablet TAKE 1 TABLET BY MOUTH TWICE A DAY   magnesium oxide (MAG-OX) 400 MG tablet Take 400 mg by mouth daily.   metoprolol succinate (TOPROL-XL) 50 MG 24 hr tablet TAKE 1 TABLET BY MOUTH DAILY. TAKE WITH OR IMMEDIATELY FOLLOWING A MEAL.   olmesartan (BENICAR) 40 MG tablet TAKE 1 TABLET BY MOUTH EVERY DAY   Omega-3 Fatty Acids (OMEGA 3 PO) Take 2,500 mg by mouth daily. 2 tablets by mouth daily   potassium chloride (MICRO-K) 10 MEQ CR capsule TAKE 1 CAPSULE BY MOUTH EVERY DAY   Semaglutide (RYBELSUS) 3 MG TABS Take one tablet by mouth 51mins before breakfast with 4oz water.   TURMERIC CURCUMIN PO Take by mouth daily.   Zinc 50 MG TABS Take 1 tablet by mouth daily.   No facility-administered encounter medications on file as of 04/26/2021.     Called patient 04/26/2021 left message Called patient 04/27/2021 left message Called patient  04/28/2021 left message  Care Gaps: Last annual wellness visit- 01/10/2021 Last eye exam / retinopathy screening- 11/10/2020 Diabetic foot exam- never done   Star Medications: Medication Name/mg Last Fill Days Supply  Olmesartan 40 mg tablet         10/01, 12/26          90 DS Rybelsus 3 mg tablet                11/19, 12/18          30 DS    Cherlyn Labella Clinical Pharmacist Assistant [phone]

## 2021-04-27 ENCOUNTER — Telehealth: Payer: Self-pay | Admitting: *Deleted

## 2021-04-27 NOTE — Chronic Care Management (AMB) (Signed)
°  Care Management   Note  04/27/2021 Name: KENNI NEWTON MRN: [id number] DOB: 1951/04/29  ZAELA GRALEY is a 71 y.o. year old female who is a primary care patient of Glendale Chard, MD and is actively engaged with the care management team. I reached out to Anson Fret by phone today to assist with re-scheduling a follow up visit with the RN Case Manager  Follow up plan: Unsuccessful telephone outreach attempt made. A HIPAA compliant phone message was left for the patient providing contact information and requesting a return call.  The care management team will reach out to the patient again over the next 7 days.  If patient returns call to provider office, please advise to call [place] at [phone].  [place] Management  Direct Dial: [phone]

## 2021-04-30 DIAGNOSIS — Z0100 Encounter for examination of eyes and vision without abnormal findings: Secondary | ICD-10-CM | POA: Diagnosis not present

## 2021-05-03 ENCOUNTER — Telehealth: Payer: Medicare HMO

## 2021-05-04 NOTE — Chronic Care Management (AMB) (Signed)
°  Care Management   Note  05/04/2021 Name: MICHEL ESKELSON MRN: [id number] DOB: [birth year]  Robin Odom is a 71 y.o. year old female who is a primary care patient of Glendale Chard, MD and is actively engaged with the care management team. I reached out to Anson Fret by phone today to assist with re-scheduling a follow up visit with the RN Case Manager  Follow up plan: Unsuccessful telephone outreach attempt made. A HIPAA compliant phone message was left for the patient providing contact information and requesting a return call.  The care management team will reach out to the patient again over the next 7 days.  If patient returns call to provider office, please advise to call [place] at [phone].  [place] Management  Direct Dial: [phone]

## 2021-05-04 NOTE — Chronic Care Management (AMB) (Signed)
°  Care Management   Note  05/04/2021 Name: Robin Odom MRN: [id number] DOB: 01-19-1951  Robin Odom is a 71 y.o. year old female who is a primary care patient of Glendale Chard, MD and is actively engaged with the care management team. I reached out to Anson Fret by phone today to assist with re-scheduling a follow up visit with the RN Case Manager  Follow up plan: Telephone appointment with care management team member scheduled for:06/10/21  [place], [place] Management  Direct Dial: [phone]

## 2021-05-05 NOTE — Progress Notes (Signed)
Office Visit Note  Patient: Robin Odom             Date of Birth: 1951-01-28           MRN: [id number]             PCP: Glendale Chard, MD Referring: Glendale Chard, MD Visit Date: 05/18/2021 Occupation: @GUAROCC @  Subjective:  Medication monitoring   History of Present Illness: Robin Odom is a 71 y.o. female with history of gout, osteoarthritis, and DDD. She is taking allopurinol 300 mg 1 tablet by mouth daily. She denies any signs or symptoms of a gout flare.  She states her last gout flare was in 2009.  She has been tolerating allopurinol without any side effects and has not missed any doses recently.  She continues to exercise 5 to 6 days/week [facility]  She experiences occasional discomfort in both knee joints especially if standing for prolonged periods of time.  She requested a renewed handicap placard today.   Activities of Daily Living:  Patient reports morning stiffness for 0 minutes  Patient Denies nocturnal pain.  Difficulty dressing/grooming: Denies Difficulty climbing stairs: Denies Difficulty getting out of chair: Denies Difficulty using hands for taps, buttons, cutlery, and/or writing: Denies  Review of Systems  Constitutional:  Negative for fatigue.  HENT:  Positive for mouth dryness. Negative for mouth sores and nose dryness.   Eyes:  Negative for pain, visual disturbance and dryness.  Respiratory:  Negative for cough, hemoptysis, shortness of breath and difficulty breathing.   Cardiovascular:  Negative for chest pain, palpitations, hypertension and swelling in legs/feet.  Gastrointestinal:  Negative for blood in stool, constipation and diarrhea.  Endocrine: Negative for increased urination.  Genitourinary:  Negative for painful urination.  Musculoskeletal:  Negative for joint pain, joint pain, joint swelling, myalgias, muscle weakness, morning stiffness, muscle tenderness and myalgias.  Skin:  Negative for color change, pallor, rash,  hair loss, nodules/bumps, skin tightness, ulcers and sensitivity to sunlight.  Allergic/Immunologic: Negative for susceptible to infections.  Neurological:  Negative for dizziness, numbness, headaches and weakness.  Hematological:  Negative for swollen glands.  Psychiatric/Behavioral:  Negative for depressed mood and sleep disturbance. The patient is not nervous/anxious.    PMFS History:  Patient Active Problem List   Diagnosis Date Noted   History of wheezing 03/18/2020   Osteopenia of multiple sites 10/30/2019   Prediabetes 05/14/2018   Essential hypertension, benign 05/14/2018   Class 3 severe obesity due to excess calories with serious comorbidity and body mass index (BMI) of 40.0 to 44.9 in adult ([place]) 05/14/2018   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 tophus 07/20/2016   Primary osteoarthritis of both hands 07/20/2016   Primary osteoarthritis of both knees 07/20/2016   Primary osteoarthritis of both feet 07/20/2016   DJD (degenerative joint disease), cervical 07/20/2016   Elevated CK 07/20/2016   History of humerus fracture 07/20/2016   History of hypertension 07/20/2016   History of diabetes mellitus 07/20/2016   History of hyperlipidemia 07/20/2016   Chronic rhinitis 10/20/2015   Anosmia 10/20/2015   Mild intermittent asthma 10/20/2015    Past Medical History:  Diagnosis Date   Arthritis    Diabetes mellitus without complication (HCC)    Gout    Hypertension    Partial retinal tear of both eyes without detachment 08/29/2017    Family History  Problem Relation Age of Onset   Heart disease Mother    Hypertension Mother  Diabetes Mother    Arthritis Father    Hypertension Father    Prostate cancer Father    Parkinson's disease Father    Heart disease Father    Sinusitis Sister    Allergic rhinitis Sister    Diabetes Sister    High Cholesterol Sister    Hypertension Sister    Diabetes Brother    Hypertension Brother    High Cholesterol Brother     Hypertension Brother    Angioedema Neg Hx    Asthma Neg Hx    Eczema Neg Hx    Immunodeficiency Neg Hx    Urticaria Neg Hx    Breast cancer Neg Hx    Past Surgical History:  Procedure Laterality Date   ABDOMINAL HYSTERECTOMY  1991   CARPAL TUNNEL RELEASE Right 2019   COLONOSCOPY     5 between 1994-2010   Left knee surgery  2006   ROTATOR CUFF REPAIR Left 2001   ROTATOR CUFF REPAIR Right 2003   SHOULDER SURGERY  2001/2003   TONSILECTOMY/ADENOIDECTOMY WITH MYRINGOTOMY  1971   Social History   Social History Narrative   Not on file   Immunization History  Administered Date(s) Administered   Influenza, High Dose Seasonal PF 02/05/2017, 02/12/2018, 12/16/2018, 01/13/2020, 01/18/2021   Influenza,inj,quad, With Preservative 02/13/2017, 02/01/2018   Influenza-Unspecified 02/03/2015, 01/18/2018, 01/29/2018, 12/16/2018   PFIZER(Purple Top)SARS-COV-2 Vaccination 01/01/2019, 01/23/2019, 08/19/2019, 08/13/2020   Pfizer Covid-19 Vaccine Bivalent Booster 58yrs & up 04/03/2021   Pneumococcal Conjugate-13 01/20/2014   Pneumococcal-Unspecified 02/14/2017   Tdap 12/13/2020   Unspecified SARS-COV-2 Vaccination 08/20/2019   Zoster Recombinat (Shingrix) 10/18/2019, 03/05/2020     Objective: Vital Signs: BP (!) 145/87 (BP Location: Left Arm, Patient Position: Sitting, Cuff Size: Large)    Pulse 67    Resp 13    Ht 5\' 5"  (1.651 m)    Wt 234 lb 9.6 oz (106.4 kg)    BMI 39.04 kg/m    Physical Exam Vitals and nursing note reviewed.  Constitutional:      Appearance: She is well-developed.  HENT:     Head: Normocephalic and atraumatic.  Eyes:     Conjunctiva/sclera: Conjunctivae normal.  Pulmonary:     Effort: Pulmonary effort is normal.  Abdominal:     Palpations: Abdomen is soft.  Musculoskeletal:     Cervical back: Normal range of motion.  Skin:    General: Skin is warm and dry.     Capillary Refill: Capillary refill takes less than 2 seconds.  Neurological:     Mental Status:  She is alert and oriented to person, place, and time.  Psychiatric:        Behavior: Behavior normal.     Musculoskeletal Exam: C-spine, thoracic spine, and lumbar spine good ROM.  No midline spinal tenderness or SI joint tenderness noted.  Shoulder joints, elbow joints, wrist joints, MCPs, PIPs, and DIPs good ROM with no synovitis.  PIP and DIP thickening consistent with osteoarthritis of both hands.  Complete fist formation bilaterally.  Hip joints, knee joints, and ankle joints have good ROM with no discomfort.  No warmth or effusion of knee joints noted.  No tenderness or swelling of ankle joints.  CDAI Exam: CDAI Score: -- Patient Global: --; Provider Global: -- Swollen: --; Tender: -- Joint Exam 05/18/2021   No joint exam has been documented for this visit   There is currently no information documented on the homunculus. Go to the Rheumatology activity and complete the homunculus joint exam.  Investigation:  No additional findings.  Imaging: No results found.  Recent Labs: Lab Results  Component Value Date   WBC 9.4 01/14/2021   HGB 13.1 01/14/2021   PLT 309 01/14/2021   NA 144 01/14/2021   K 4.1 01/14/2021   CL 105 01/14/2021   CO2 32 01/14/2021   GLUCOSE 88 01/14/2021   BUN 19 01/14/2021   CREATININE 0.75 01/14/2021   BILITOT 0.5 01/14/2021   ALKPHOS 71 07/08/2020   AST 18 01/14/2021   ALT 12 01/14/2021   PROT 7.0 01/14/2021   ALBUMIN 4.4 07/08/2020   CALCIUM 9.7 01/14/2021   GFRAA 77 05/13/2020    Speciality Comments: No specialty comments available.  Procedures:  No procedures performed Allergies: Codeine and Penicillins   Assessment / Plan:     Visit Diagnoses: 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 tophus - She has not had any signs or symptoms of a gout flare.  She has clinically been doing well taking allopurinol 300 mg 1 tablet by mouth daily.  She continues to tolerate allopurinol without any side effects and has not missed any doses  recently.  Her uric acid was within the desirable range: 4.2 on 01/14/2021.  Uric acid, CBC, and CMP will be updated today.  She will remain on the current treatment regimen.  A refill of allopurinol was sent to the pharmacy today.  She was advised to notify us if she develops signs or symptoms of a flare.  She will follow-up in the office in 6 months.   - Plan: Uric acid  Medication monitoring encounter -Uric acid was within the desirable range-4.2 on 01/14/21.  CBC and CMP updated on 01/14/21.  She is due to update lab work today so orders for CBC, CMP, and uric acid will be updated today.  - Plan: CBC with Differential/Platelet, COMPLETE METABOLIC PANEL WITH GFR, Uric acid  Primary osteoarthritis of both hands: She has PIP and DIP thickening consistent with osteoarthritis of both hands.  No tenderness or inflammation was noted on examination today.  She was able to make a complete fist bilaterally.  Discussed the importance of joint protection and muscle strengthening.  Primary osteoarthritis of both knees: She has good range of motion of both knee joints on examination today.  No warmth or effusion was noted.  She has difficulty walking or standing for prolonged periods of time due to severity of pain in her knees at times.  She requested a renewed handicap placard which was provided today in the office.  Primary osteoarthritis of both feet: She is not experiencing any increased discomfort in her feet currently.  She has good range of motion of both ankle joints with no joint tenderness.  Pedal edema noted bilaterally.  Encourage the patient to wear compression stockings on a daily basis.  DDD (degenerative disc disease), cervical: She has good range of motion of the C-spine with no discomfort at this time.  Osteopenia of multiple sites - 10/29/2019 bone density report from [place] showed T score of -2.1 in the left femoral neck consistent with osteopenia.   Due to update DEXA in June  2023.  No recent falls or fractures. She is taking a calcium and vitamin D supplement daily as recommended.   Other medical conditions are listed as follows:  History of diabetes mellitus  History of hypertension  History of hyperlipidemia  History of humerus fracture  Orders: Orders Placed This Encounter  Procedures   CBC with Differential/Platelet   COMPLETE METABOLIC PANEL  WITH GFR   Uric acid   Meds ordered this encounter  Medications   allopurinol (ZYLOPRIM) 300 MG tablet    Sig: Take 1 tablet (300 mg total) by mouth daily.    Dispense:  100 tablet    Refill:  0     Follow-Up Instructions: Return in about 6 months (around 11/15/2021) for Osteoarthritis, DDD, Gout.   Ofilia Neas, PA-C  Note - This record has been created using Dragon software.  Chart creation errors have been sought, but may not always  have been located. Such creation errors do not reflect on  the standard of medical care.

## 2021-05-11 ENCOUNTER — Encounter: Payer: Self-pay | Admitting: Internal Medicine

## 2021-05-11 DIAGNOSIS — Z6837 Body mass index (BMI) 37.0-37.9, adult: Secondary | ICD-10-CM | POA: Diagnosis not present

## 2021-05-11 DIAGNOSIS — Z01419 Encounter for gynecological examination (general) (routine) without abnormal findings: Secondary | ICD-10-CM | POA: Diagnosis not present

## 2021-05-18 ENCOUNTER — Ambulatory Visit: Payer: Medicare HMO | Admitting: Physician Assistant

## 2021-05-18 ENCOUNTER — Other Ambulatory Visit: Payer: Self-pay

## 2021-05-18 ENCOUNTER — Encounter: Payer: Self-pay | Admitting: Physician Assistant

## 2021-05-18 VITALS — BP 145/87 | HR 67 | Resp 13 | Ht 65.0 in | Wt 234.6 lb

## 2021-05-18 DIAGNOSIS — M170 Bilateral primary osteoarthritis of knee: Secondary | ICD-10-CM | POA: Diagnosis not present

## 2021-05-18 DIAGNOSIS — M8589 Other specified disorders of bone density and structure, multiple sites: Secondary | ICD-10-CM | POA: Diagnosis not present

## 2021-05-18 DIAGNOSIS — Z8639 Personal history of other endocrine, nutritional and metabolic disease: Secondary | ICD-10-CM | POA: Diagnosis not present

## 2021-05-18 DIAGNOSIS — M1A09X0 Idiopathic chronic gout, multiple sites, without tophus (tophi): Secondary | ICD-10-CM

## 2021-05-18 DIAGNOSIS — Z8781 Personal history of (healed) traumatic fracture: Secondary | ICD-10-CM

## 2021-05-18 DIAGNOSIS — Z8679 Personal history of other diseases of the circulatory system: Secondary | ICD-10-CM | POA: Diagnosis not present

## 2021-05-18 DIAGNOSIS — M19041 Primary osteoarthritis, right hand: Secondary | ICD-10-CM | POA: Diagnosis not present

## 2021-05-18 DIAGNOSIS — M5030 Other cervical disc degeneration, unspecified cervical region: Secondary | ICD-10-CM

## 2021-05-18 DIAGNOSIS — M19071 Primary osteoarthritis, right ankle and foot: Secondary | ICD-10-CM | POA: Diagnosis not present

## 2021-05-18 DIAGNOSIS — M19042 Primary osteoarthritis, left hand: Secondary | ICD-10-CM

## 2021-05-18 DIAGNOSIS — Z5181 Encounter for therapeutic drug level monitoring: Secondary | ICD-10-CM

## 2021-05-18 DIAGNOSIS — M19072 Primary osteoarthritis, left ankle and foot: Secondary | ICD-10-CM

## 2021-05-18 LAB — CBC WITH DIFFERENTIAL/PLATELET
Absolute Monocytes: 801 cells/uL (ref 200–950)
Basophils Absolute: 64 cells/uL (ref 0–200)
Basophils Relative: 0.7 %
Eosinophils Absolute: 191 cells/uL (ref 15–500)
Eosinophils Relative: 2.1 %
HCT: 39.8 % (ref 35.0–45.0)
Hemoglobin: 13.1 g/dL (ref 11.7–15.5)
Lymphs Abs: 2339 cells/uL (ref 850–3900)
MCH: 26.7 pg — ABNORMAL LOW (ref 27.0–33.0)
MCHC: 32.9 g/dL (ref 32.0–36.0)
MCV: 81.2 fL (ref 80.0–100.0)
MPV: 10.8 fL (ref 7.5–12.5)
Monocytes Relative: 8.8 %
Neutro Abs: 5706 cells/uL (ref 1500–7800)
Neutrophils Relative %: 62.7 %
Platelets: 334 10*3/uL (ref 140–400)
RBC: 4.9 10*6/uL (ref 3.80–5.10)
RDW: 14.9 % (ref 11.0–15.0)
Total Lymphocyte: 25.7 %
WBC: 9.1 10*3/uL (ref 3.8–10.8)

## 2021-05-18 LAB — COMPLETE METABOLIC PANEL WITH GFR
AG Ratio: 1.5 (calc) (ref 1.0–2.5)
ALT: 14 U/L (ref 6–29)
AST: 22 U/L (ref 10–35)
Albumin: 4.2 g/dL (ref 3.6–5.1)
Alkaline phosphatase (APISO): 66 U/L (ref 37–153)
BUN: 16 mg/dL (ref 7–25)
CO2: 32 mmol/L (ref 20–32)
Calcium: 10 mg/dL (ref 8.6–10.4)
Chloride: 104 mmol/L (ref 98–110)
Creat: 0.77 mg/dL (ref 0.60–1.00)
Globulin: 2.8 g/dL (calc) (ref 1.9–3.7)
Glucose, Bld: 80 mg/dL (ref 65–99)
Potassium: 4.1 mmol/L (ref 3.5–5.3)
Sodium: 141 mmol/L (ref 135–146)
Total Bilirubin: 0.3 mg/dL (ref 0.2–1.2)
Total Protein: 7 g/dL (ref 6.1–8.1)
eGFR: 82 mL/min/{1.73_m2} (ref >=60)

## 2021-05-18 LAB — URIC ACID: Uric Acid, Serum: 3.9 mg/dL (ref 2.5–7.0)

## 2021-05-18 MED ORDER — ALLOPURINOL 300 MG PO TABS
300.0000 mg | ORAL_TABLET | Freq: Every day | ORAL | 0 refills | Status: DC
Start: 2021-05-18 — End: 2021-08-16

## 2021-05-19 ENCOUNTER — Ambulatory Visit (INDEPENDENT_AMBULATORY_CARE_PROVIDER_SITE_OTHER): Payer: Medicare HMO

## 2021-05-19 VITALS — Ht 65.5 in | Wt 234.0 lb

## 2021-05-19 DIAGNOSIS — Z0000 Encounter for general adult medical examination without abnormal findings: Secondary | ICD-10-CM

## 2021-05-19 NOTE — Progress Notes (Signed)
CBC and CMP WNL.  Uric acid is within the desirable range.

## 2021-05-19 NOTE — Patient Instructions (Signed)
Robin Odom , Thank you for taking time to come for your Medicare Wellness Visit. I appreciate your ongoing commitment to your health goals. Please review the following plan we discussed and let me know if I can assist you in the future.   Screening recommendations/referrals: Colonoscopy: completed 02/18/2020, due 02/18/2023 Mammogram: completed 02/22/2021 Bone Density: completed 10/29/2019 Recommended yearly ophthalmology/optometry visit for glaucoma screening and checkup Recommended yearly dental visit for hygiene and checkup  Vaccinations: Influenza vaccine: completed 01/18/2021 Pneumococcal vaccine: due Tdap vaccine: completed 12/13/2020, due 12/14/2030 Shingles vaccine: completed   Covid-19: 04/03/2021, 08/13/2020, 08/20/2019, 01/23/2019, 01/01/2019  Advanced directives: Please bring a copy of your POA (Power of Attorney) and/or Living Will to your next appointment.   Conditions/risks identified: none  Next appointment: Follow up in one year for your annual wellness visit    Preventive Care 65 Years and Older, Female Preventive care refers to lifestyle choices and visits with your health care provider that can promote health and wellness. What does preventive care include? A yearly physical exam. This is also called an annual well check. Dental exams once or twice a year. Routine eye exams. Ask your health care provider how often you should have your eyes checked. Personal lifestyle choices, including: Daily care of your teeth and gums. Regular physical activity. Eating a healthy diet. Avoiding tobacco and drug use. Limiting alcohol use. Practicing safe sex. Taking low-dose aspirin every day. Taking vitamin and mineral supplements as recommended by your health care provider. What happens during an annual well check? The services and screenings done by your health care provider during your annual well check will depend on your age, overall health, lifestyle risk factors, and family  history of disease. Counseling  Your health care provider may ask you questions about your: Alcohol use. Tobacco use. Drug use. Emotional well-being. Home and relationship well-being. Sexual activity. Eating habits. History of falls. Memory and ability to understand (cognition). Work and work Statistician. Reproductive health. Screening  You may have the following tests or measurements: Height, weight, and BMI. Blood pressure. Lipid and cholesterol levels. These may be checked every 5 years, or more frequently if you are over 46 years old. Skin check. Lung cancer screening. You may have this screening every year starting at age 12 if you have a 30-pack-year history of smoking and currently smoke or have quit within the past 15 years. Fecal occult blood test (FOBT) of the stool. You may have this test every year starting at age 2. Flexible sigmoidoscopy or colonoscopy. You may have a sigmoidoscopy every 5 years or a colonoscopy every 10 years starting at age 60. Hepatitis C blood test. Hepatitis B blood test. Sexually transmitted disease (STD) testing. Diabetes screening. This is done by checking your blood sugar (glucose) after you have not eaten for a while (fasting). You may have this done every 1-3 years. Bone density scan. This is done to screen for osteoporosis. You may have this done starting at age 58. Mammogram. This may be done every 1-2 years. Talk to your health care provider about how often you should have regular mammograms. Talk with your health care provider about your test results, treatment options, and if necessary, the need for more tests. Vaccines  Your health care provider may recommend certain vaccines, such as: Influenza vaccine. This is recommended every year. Tetanus, diphtheria, and acellular pertussis (Tdap, Td) vaccine. You may need a Td booster every 10 years. Zoster vaccine. You may need this after age 67. Pneumococcal 13-valent conjugate (PCV13)  vaccine. One dose is recommended after age 58. Pneumococcal polysaccharide (PPSV23) vaccine. One dose is recommended after age 64. Talk to your health care provider about which screenings and vaccines you need and how often you need them. This information is not intended to replace advice given to you by your health care provider. Make sure you discuss any questions you have with your health care provider. Document Released: 05/14/2015 Document Revised: 01/05/2016 Document Reviewed: 02/16/2015 Elsevier Interactive Patient Education  2017 [place] Prevention in the Home Falls can cause injuries. They can happen to people of all ages. There are many things you can do to make your home safe and to help prevent falls. What can I do on the outside of my home? Regularly fix the edges of walkways and driveways and fix any cracks. Remove anything that might make you trip as you walk through a door, such as a raised step or threshold. Trim any bushes or trees on the path to your home. Use bright outdoor lighting. Clear any walking paths of anything that might make someone trip, such as rocks or tools. Regularly check to see if handrails are loose or broken. Make sure that both sides of any steps have handrails. Any raised decks and porches should have guardrails on the edges. Have any leaves, snow, or ice cleared regularly. Use sand or salt on walking paths during winter. Clean up any spills in your garage right away. This includes oil or grease spills. What can I do in the bathroom? Use night lights. Install grab bars by the toilet and in the tub and shower. Do not use towel bars as grab bars. Use non-skid mats or decals in the tub or shower. If you need to sit down in the shower, use a plastic, non-slip stool. Keep the floor dry. Clean up any water that spills on the floor as soon as it happens. Remove soap buildup in the tub or shower regularly. Attach bath mats securely with  double-sided non-slip rug tape. Do not have throw rugs and other things on the floor that can make you trip. What can I do in the bedroom? Use night lights. Make sure that you have a light by your bed that is easy to reach. Do not use any sheets or blankets that are too big for your bed. They should not hang down onto the floor. Have a firm chair that has side arms. You can use this for support while you get dressed. Do not have throw rugs and other things on the floor that can make you trip. What can I do in the kitchen? Clean up any spills right away. Avoid walking on wet floors. Keep items that you use a lot in easy-to-reach places. If you need to reach something above you, use a strong step stool that has a grab bar. Keep electrical cords out of the way. Do not use floor polish or wax that makes floors slippery. If you must use wax, use non-skid floor wax. Do not have throw rugs and other things on the floor that can make you trip. What can I do with my stairs? Do not leave any items on the stairs. Make sure that there are handrails on both sides of the stairs and use them. Fix handrails that are broken or loose. Make sure that handrails are as long as the stairways. Check any carpeting to make sure that it is firmly attached to the stairs. Fix any carpet that is loose or  worn. Avoid having throw rugs at the top or bottom of the stairs. If you do have throw rugs, attach them to the floor with carpet tape. Make sure that you have a light switch at the top of the stairs and the bottom of the stairs. If you do not have them, ask someone to add them for you. What else can I do to help prevent falls? Wear shoes that: Do not have high heels. Have rubber bottoms. Are comfortable and fit you well. Are closed at the toe. Do not wear sandals. If you use a stepladder: Make sure that it is fully opened. Do not climb a closed stepladder. Make sure that both sides of the stepladder are locked  into place. Ask someone to hold it for you, if possible. Clearly mark and make sure that you can see: Any grab bars or handrails. First and last steps. Where the edge of each step is. Use tools that help you move around (mobility aids) if they are needed. These include: Canes. Walkers. Scooters. Crutches. Turn on the lights when you go into a dark area. Replace any light bulbs as soon as they burn out. Set up your furniture so you have a clear path. Avoid moving your furniture around. If any of your floors are uneven, fix them. If there are any pets around you, be aware of where they are. Review your medicines with your doctor. Some medicines can make you feel dizzy. This can increase your chance of falling. Ask your doctor what other things that you can do to help prevent falls. This information is not intended to replace advice given to you by your health care provider. Make sure you discuss any questions you have with your health care provider. Document Released: 02/11/2009 Document Revised: 09/23/2015 Document Reviewed: 05/22/2014 Elsevier Interactive Patient Education  2017 [employer].

## 2021-05-19 NOTE — Progress Notes (Signed)
I connected with Robin Odom today by telephone and verified that I am speaking with the correct person using two identifiers. Location patient: home Location provider: work Persons participating in the virtual visit: Robin, [name] LPN.   I discussed the limitations, risks, security and privacy concerns of performing an evaluation and management service by telephone and the availability of in person appointments. I also discussed with the patient that there may be a patient responsible charge related to this service. The patient expressed understanding and verbally consented to this telephonic visit.    Interactive audio and video telecommunications were attempted between this provider and patient, however failed, due to patient having technical difficulties OR patient did not have access to video capability.  We continued and completed visit with audio only.     Vital signs may be patient reported or missing.  Subjective:   Robin Odom is a 71 y.o. female who presents for Medicare Annual (Subsequent) preventive examination.  Review of Systems     Cardiac Risk Factors include: advanced age (>70men, >73 women);hypertension     Objective:    Today's Vitals   05/19/21 1057 05/19/21 1058  Weight: 234 lb (106.1 kg)   Height: 5' 5.5" (1.664 m)   PainSc:  2    Body mass index is 38.35 kg/m.  Advanced Directives 05/19/2021 10/05/2020 05/13/2020 04/16/2019 04/04/2018  Does Patient Have a Medical Advance Directive? Yes Yes Yes Yes Yes  Type of Paramedic of [place];Living will [place];Living will [place];Living will [place];Living will [place];Living will  Does patient want to make changes to medical advance directive? - No - Patient declined - - No - Patient declined  Copy of [place] in Chart? No - copy requested No - copy requested No - copy requested No  - copy requested No - copy requested    Current Medications (verified) Outpatient Encounter Medications as of 05/19/2021  Medication Sig   allopurinol (ZYLOPRIM) 300 MG tablet Take 1 tablet (300 mg total) by mouth daily.   amLODipine (NORVASC) 10 MG tablet Take 1 tablet (10 mg total) by mouth daily.   Ascorbic Acid (VITAMIN C) 100 MG tablet Take 100 mg by mouth daily.   azelastine (ASTELIN) 0.1 % nasal spray 1-2 sprays each nostril twice daily as needed   B Complex Vitamins (VITAMIN-B COMPLEX) TABS Take by mouth.   BENFOTIAMINE PO Take by mouth 2 (two) times daily.   Calcium Carb-Cholecalciferol (CALCIUM 500 +D) 500-10 MG-MCG TABS Take 1 tablet by mouth daily.   Cholecalciferol (VITAMIN D3) 125 MCG (5000 UT) CAPS Take by mouth.    CITRUS BERGAMOT PO Take 1 tablet by mouth daily. 500 mg   colchicine 0.6 MG tablet Take 0.6 mg by mouth as needed.   furosemide (LASIX) 20 MG tablet TAKE 1 TABLET BY MOUTH EVERY DAY   hydrALAZINE (APRESOLINE) 25 MG tablet TAKE 1 TABLET BY MOUTH TWICE A DAY   magnesium oxide (MAG-OX) 400 MG tablet Take 400 mg by mouth daily.   metoprolol succinate (TOPROL-XL) 50 MG 24 hr tablet TAKE 1 TABLET BY MOUTH DAILY. TAKE WITH OR IMMEDIATELY FOLLOWING A MEAL.   olmesartan (BENICAR) 40 MG tablet TAKE 1 TABLET BY MOUTH EVERY DAY   Omega-3 Fatty Acids (OMEGA 3 PO) Take 2,500 mg by mouth daily. 2 tablets by mouth daily   potassium chloride (MICRO-K) 10 MEQ CR capsule TAKE 1 CAPSULE BY MOUTH EVERY  DAY   Semaglutide (RYBELSUS) 3 MG TABS Take one tablet by mouth 60mins before breakfast with 4oz water.   TURMERIC CURCUMIN PO Take by mouth daily.   calcium carbonate (OS-CAL) 600 MG TABS tablet Take 600 mg by mouth daily.   Zinc 50 MG TABS Take 1 tablet by mouth daily. (Patient not taking: Reported on 05/18/2021)   No facility-administered encounter medications on file as of 05/19/2021.    Allergies (verified) Codeine and Penicillins   History: Past Medical History:  Diagnosis  Date   Arthritis    Diabetes mellitus without complication ([place])    Gout    Hypertension    Partial retinal tear of both eyes without detachment 08/29/2017   Past Surgical History:  Procedure Laterality Date   ABDOMINAL HYSTERECTOMY  1991   CARPAL TUNNEL RELEASE Right 2019   COLONOSCOPY     5 between 1994-2010   Left knee surgery  2006   ROTATOR CUFF REPAIR Left 2001   ROTATOR CUFF REPAIR Right 2003   SHOULDER SURGERY  2001/2003   TONSILECTOMY/ADENOIDECTOMY WITH MYRINGOTOMY  1971   Family History  Problem Relation Age of Onset   Heart disease Mother    Hypertension Mother    Diabetes Mother    Arthritis Father    Hypertension Father    Prostate cancer Father    Parkinson's disease Father    Heart disease Father    Sinusitis Sister    Allergic rhinitis Sister    Diabetes Sister    High Cholesterol Sister    Hypertension Sister    Diabetes Brother    Hypertension Brother    High Cholesterol Brother    Hypertension Brother    Angioedema Neg Hx    Asthma Neg Hx    Eczema Neg Hx    Immunodeficiency Neg Hx    Urticaria Neg Hx    Breast cancer Neg Hx    Social History   Socioeconomic History   Marital status: Single    Spouse name: Not on file   Number of children: Not on file   Years of education: Not on file   Highest education level: Not on file  Occupational History   Occupation: retired  Tobacco Use   Smoking status: Never   Smokeless tobacco: Never  Vaping Use   Vaping Use: Never used  Substance and Sexual Activity   Alcohol use: Yes    Comment: rarely   Drug use: No   Sexual activity: Not Currently    Birth control/protection: None    Comment: Hysterectomy  Other Topics Concern   Not on file  Social History Narrative   Not on file   Social Determinants of Health   Financial Resource Strain: Low Risk    Difficulty of Paying Living Expenses: Not hard at all  Food Insecurity: No Food Insecurity   Worried About Charity fundraiser in the Last  Year: Never true   Ran Out of Food in the Last Year: Never true  Transportation Needs: No Transportation Needs   Lack of Transportation (Medical): No   Lack of Transportation (Non-Medical): No  Physical Activity: Sufficiently Active   Days of Exercise per Week: 5 days   Minutes of Exercise per Session: 30 min  Stress: No Stress Concern Present   Feeling of Stress : Not at all  Social Connections: Not on file    Tobacco Counseling Counseling given: Not Answered   Clinical Intake:  Pre-visit preparation completed: Yes  Pain : 0-10 Pain  Score: 2  Pain Location: Generalized Pain Descriptors / Indicators: Aching Pain Onset: More than a month ago Pain Frequency: Intermittent     Nutritional Status: BMI > 30  Obese Nutritional Risks: None Diabetes: No  How often do you need to have someone help you when you read instructions, pamphlets, or other written materials from your doctor or pharmacy?: 1 - Never  Diabetic? no  Interpreter Needed?: No  Information entered by :: NAllen LPN   Activities of Daily Living In your present state of health, do you have any difficulty performing the following activities: 05/19/2021 05/18/2021  Hearing? N N  Vision? N N  Difficulty concentrating or making decisions? N N  Walking or climbing stairs? N N  Dressing or bathing? N N  Doing errands, shopping? N N  Preparing Food and eating ? N N  Using the Toilet? N N  In the past six months, have you accidently leaked urine? N N  Do you have problems with loss of bowel control? N N  Managing your Medications? N N  Managing your Finances? N N  Housekeeping or managing your Housekeeping? N N  Some recent data might be hidden    Patient Care Team: Glendale Chard, MD as PCP - General (Internal Medicine) Buford Dresser, MD as PCP - Cardiology (Cardiology) Rex Kras, Claudette Stapler, RN as [place] [facility], Sharyn Blitz, [facility] (Pharmacist)  Indicate any recent  Medical Services you may have received from other than [facility] providers in the past year (date may be approximate).     Assessment:   This is a routine wellness examination for Robin Odom.  Hearing/Vision screen Vision Screening - Comments:: Regular eye exams  Dietary issues and exercise activities discussed: Current Exercise Habits: Home exercise routine, Type of exercise: Other - see comments (bike, pool and personal trainer), Time (Minutes): 30, Frequency (Times/Week): 5, Weekly Exercise (Minutes/Week): 150   Goals Addressed             This Visit's Progress    Patient Stated       05/19/2021, lose weight       Depression Screen PHQ 2/9 Scores 05/19/2021 05/13/2020 04/16/2019 07/11/2018 05/14/2018 04/10/2018 04/04/2018  PHQ - 2 Score 0 0 0 0 0 0 0  PHQ- 9 Score - - 3 - - - -    Fall Risk Fall Risk  05/19/2021 05/18/2021 05/13/2020 04/16/2019 11/27/2018  Falls in the past year? 0 0 0 0 0  Comment - - - - -  Number falls in past yr: - 0 - - -  Injury with Fall? - 0 - - -  Risk for fall due to : Medication side effect - Medication side effect Medication side effect -  Follow up Falls evaluation completed;Education provided;Falls prevention discussed - Falls evaluation completed;Education provided;Falls prevention discussed Education provided;Falls evaluation completed;Falls prevention discussed -    FALL RISK PREVENTION PERTAINING TO THE HOME:  Any stairs in or around the home? Yes  If so, are there any without handrails? No  Home free of loose throw rugs in walkways, pet beds, electrical cords, etc? Yes  Adequate lighting in your home to reduce risk of falls? Yes   ASSISTIVE DEVICES UTILIZED TO PREVENT FALLS:  Life alert? No  Use of a cane, walker or w/c? No  Grab bars in the bathroom? Yes  Shower chair or bench in shower? No  Elevated toilet seat or a handicapped toilet? Yes   TIMED UP AND GO:  Was the  test performed? No .      Cognitive Function:     6CIT Screen  05/19/2021 05/13/2020 04/16/2019 04/04/2018  What Year? 0 points 0 points 0 points 0 points  What month? 0 points 0 points 0 points 0 points  What time? 0 points 0 points 0 points 0 points  Count back from 20 0 points 0 points 0 points 0 points  Months in reverse 0 points 0 points 0 points 0 points  Repeat phrase 0 points 0 points 0 points 0 points  Total Score 0 0 0 0    Immunizations Immunization History  Administered Date(s) Administered   Influenza, High Dose Seasonal PF 02/05/2017, 02/12/2018, 12/16/2018, 01/13/2020, 01/18/2021   Influenza,inj,quad, With Preservative 02/13/2017, 02/01/2018   Influenza-Unspecified 02/03/2015, 01/18/2018, 01/29/2018, 12/16/2018   PFIZER(Purple Top)SARS-COV-2 Vaccination 01/01/2019, 01/23/2019, 08/19/2019, 08/13/2020   Pfizer Covid-19 Vaccine Bivalent Booster 21yrs & up 04/03/2021   Pneumococcal Conjugate-13 01/20/2014   Pneumococcal-Unspecified 02/14/2017   Tdap 12/13/2020   Unspecified SARS-COV-2 Vaccination 08/20/2019   Zoster Recombinat (Shingrix) 10/18/2019, 03/05/2020    TDAP status: Up to date  Flu Vaccine status: Up to date  Pneumococcal vaccine status: Due, Education has been provided regarding the importance of this vaccine. Advised may receive this vaccine at local pharmacy or Health Dept. Aware to provide a copy of the vaccination record if obtained from local pharmacy or Health Dept. Verbalized acceptance and understanding.  Covid-19 vaccine status: Completed vaccines  Qualifies for Shingles Vaccine? Yes   Zostavax completed No   Shingrix Completed?: Yes  Screening Tests Health Maintenance  Topic Date Due   FOOT EXAM  Never done   Pneumonia Vaccine 52+ Years old (2 - PPSV23 if available, else PCV20) 02/14/2018   OPHTHALMOLOGY EXAM  11/11/2019   HEMOGLOBIN A1C  07/27/2021   COLONOSCOPY (Pts 45-4yrs Insurance coverage will need to be confirmed)  02/18/2023   MAMMOGRAM  02/23/2023   TETANUS/TDAP  12/14/2030   INFLUENZA VACCINE   Completed   DEXA SCAN  Completed   COVID-19 Vaccine  Completed   Hepatitis C Screening  Completed   Zoster Vaccines- Shingrix  Completed   HPV VACCINES  Aged Out    Health Maintenance  Health Maintenance Due  Topic Date Due   FOOT EXAM  Never done   Pneumonia Vaccine 56+ Years old (2 - PPSV23 if available, else PCV20) 02/14/2018   OPHTHALMOLOGY EXAM  11/11/2019    Colorectal cancer screening: Type of screening: Colonoscopy. Completed 02/18/2020. Repeat every 3 years  Mammogram status: Completed 02/22/2021. Repeat every year  Bone Density status: Completed 10/29/2019.   Lung Cancer Screening: (Low Dose CT Chest recommended if Age 10-80 years, 30 pack-year currently smoking OR have quit w/in 15years.) does not qualify.   Lung Cancer Screening Referral: no  Additional Screening:  Hepatitis C Screening: does qualify; Completed 10/10/2012  Vision Screening: Recommended annual ophthalmology exams for early detection of glaucoma and other disorders of the eye. Is the patient up to date with their annual eye exam?  Yes  Who is the provider or what is the name of the office in which the patient attends annual eye exams?  If pt is not established with a provider, would they like to be referred to a provider to establish care? No .   Dental Screening: Recommended annual dental exams for proper oral hygiene  Community Resource Referral / Chronic Care Management: CRR required this visit?  No   CCM required this visit?  No  Plan:     I have personally reviewed and noted the following in the patients chart:   Medical and social history Use of alcohol, tobacco or illicit drugs  Current medications and supplements including opioid prescriptions.  Functional ability and status Nutritional status Physical activity Advanced directives List of other physicians Hospitalizations, surgeries, and ER visits in previous 12 months Vitals Screenings to include cognitive,  depression, and falls Referrals and appointments  In addition, I have reviewed and discussed with patient certain preventive protocols, quality metrics, and best practice recommendations. A written personalized care plan for preventive services as well as general preventive health recommendations were provided to patient.     Kellie Simmering, LPN   [id number]   Nurse Notes: none

## 2021-05-20 IMAGING — MR MR LUMBAR SPINE W/O CM
4 of 5 series · 23 of 48 positions shown · non-contrast
Comparison: Report from lumbar spine MRI 02/12/2006 (images
unavailable).

CLINICAL DATA: Sciatica of right side. Lumbar radiculopathy; lumbar
radiculopathy, greater than 6 weeks. Additional history provided by
scanning technologist: Patient reports chronic low back pain that
radiates into legs for the past year.

EXAM:
MRI LUMBAR SPINE WITHOUT CONTRAST
TECHNIQUE: Multiplanar, multisequence MR imaging of the lumbar spine was
performed. No intravenous contrast was administered.

[Series 4: T2 post-contrast · sagittal · 4.0mm · 0.53mm/px · 6 of 19 slices shown]
[im 1/19]
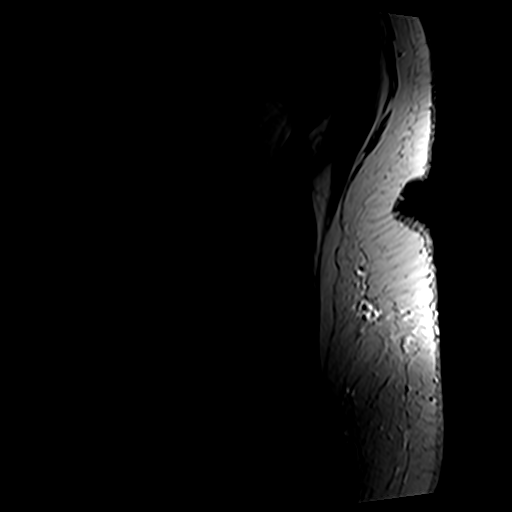
[im 4/19]
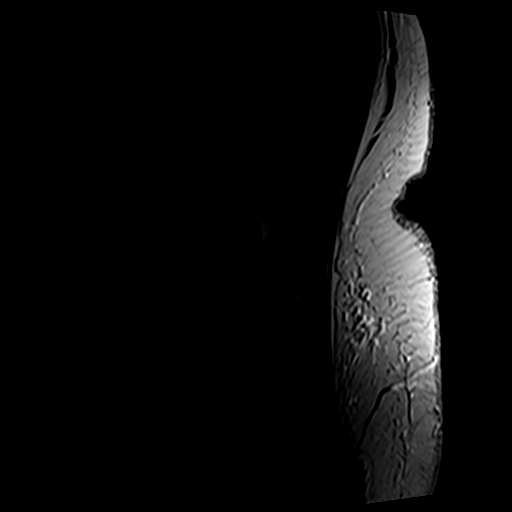
[im 8/19]
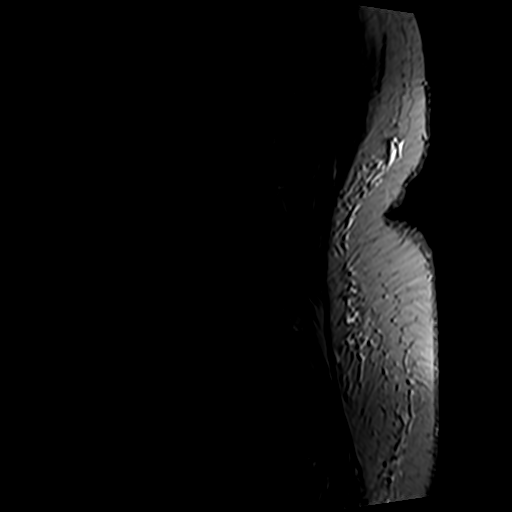
[im 11/19]
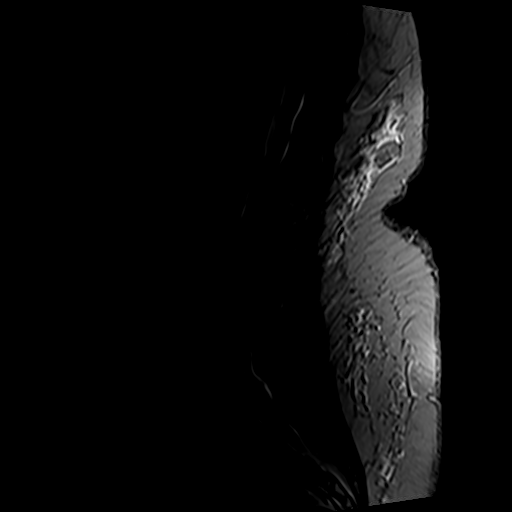
[im 15/19]
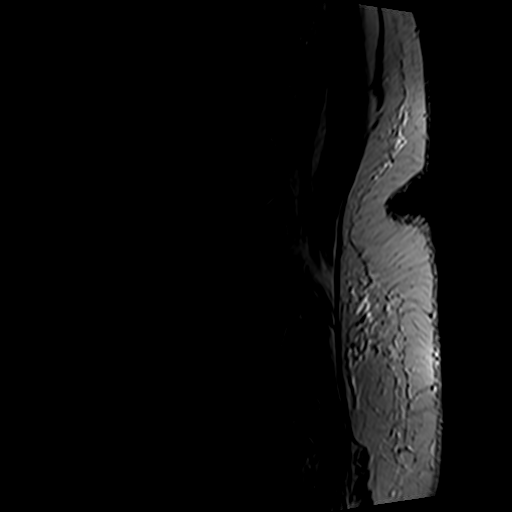
[im 19/19]
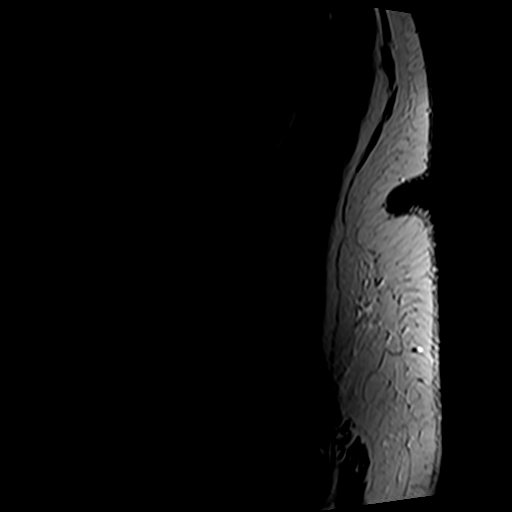

[Series 6: T1 · sagittal · 4.0mm · 0.53mm/px · 6 of 19 slices shown (1 of 2)]
[im 1/19]
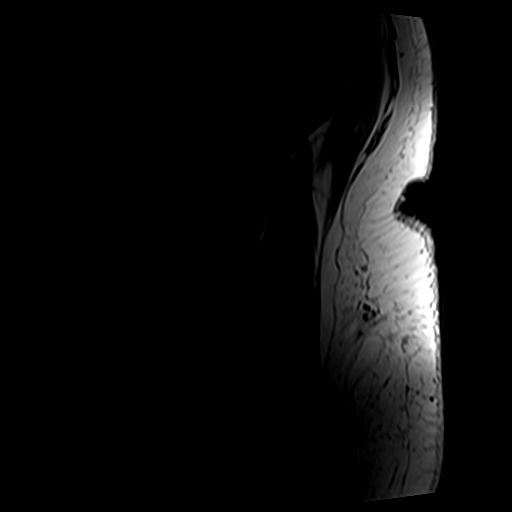
[im 4/19]
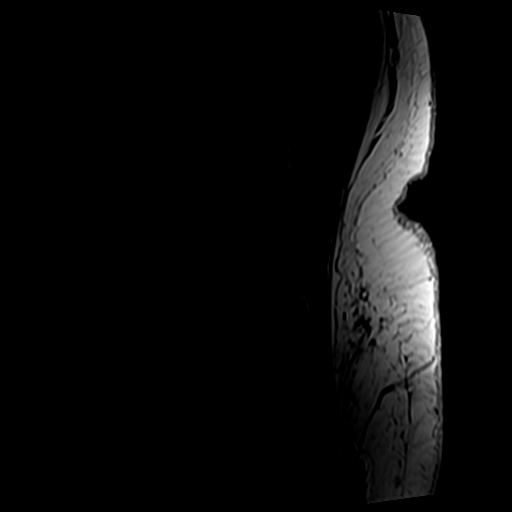
[im 7/19]
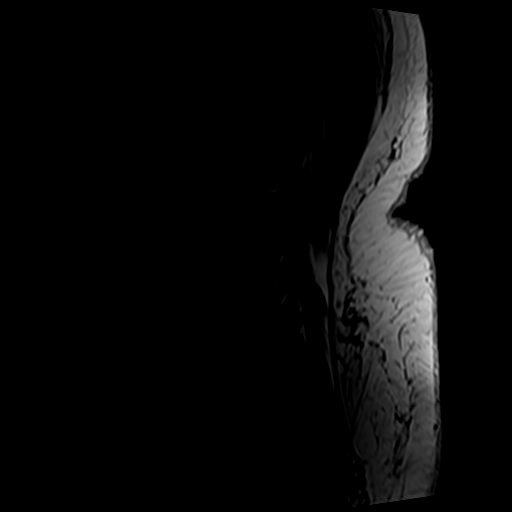
[im 10/19]
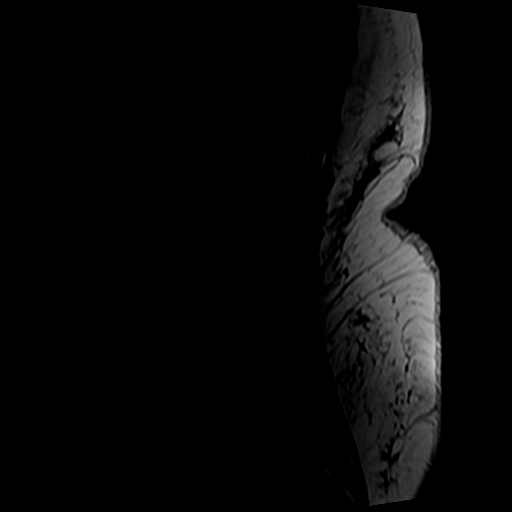
[im 13/19]
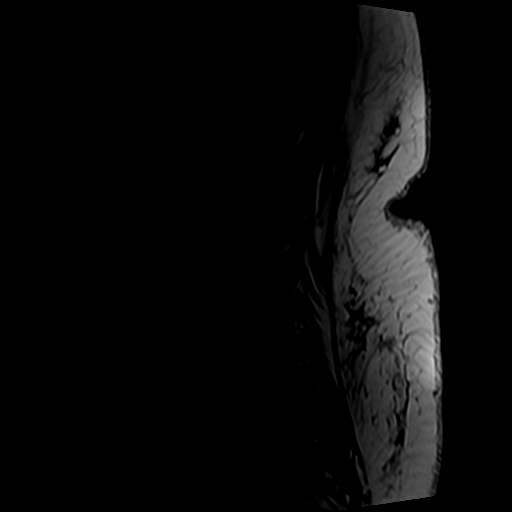
[im 16/19]
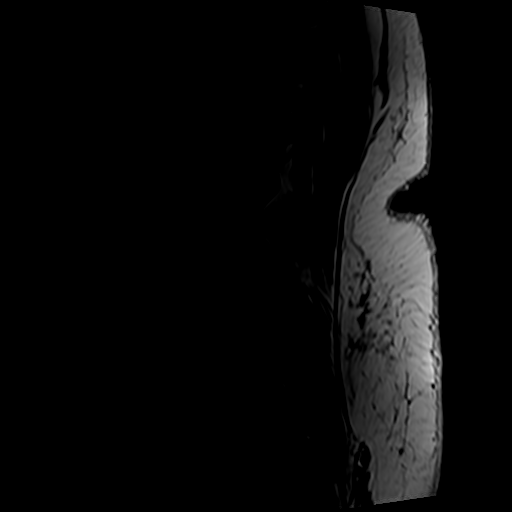

[Series 7: T2 · axial · 4.0mm · 0.70mm/px · z∈[-116,+89]mm · 8 of 39 slices shown]
[im 1/39]
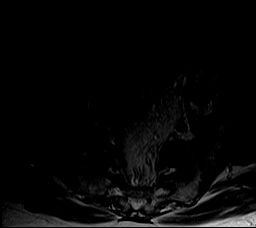
[im 6/39]
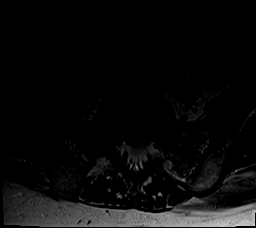
[im 12/39]
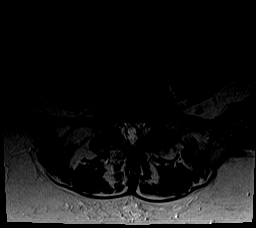
[im 18/39]
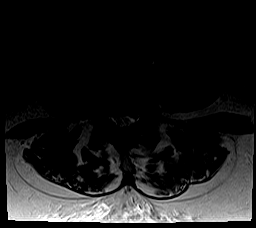
[im 21/39]
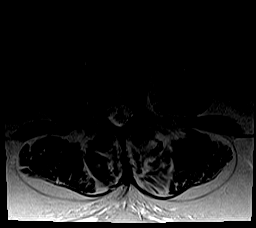
[im 27/39]
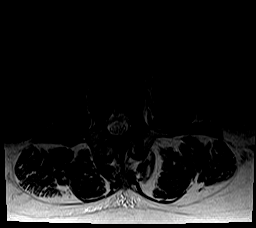
[im 33/39]
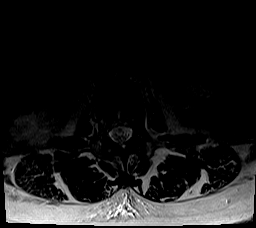
[im 39/39]
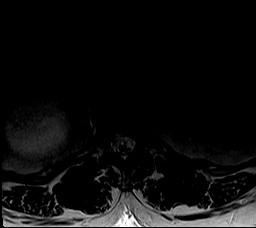

[Series 8: T1 · axial · 4.0mm · 0.35mm/px · z∈[-82,+64]mm · 3 of 40 slices shown (2 of 2)]
[im 7/40]
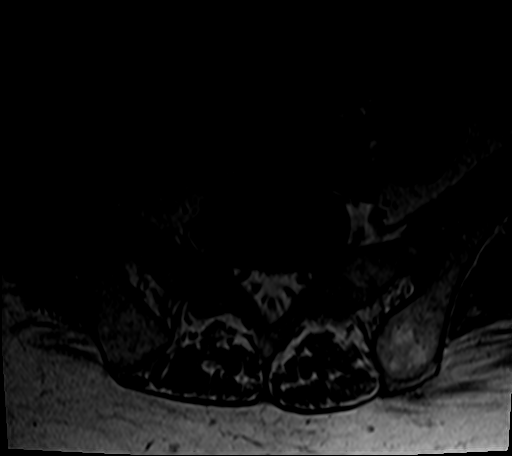
[im 22/40]
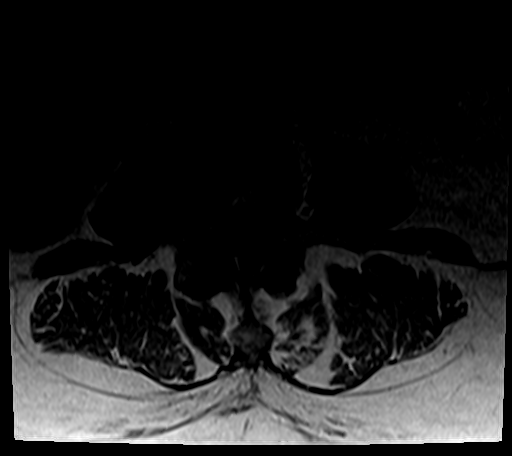
[im 34/40]
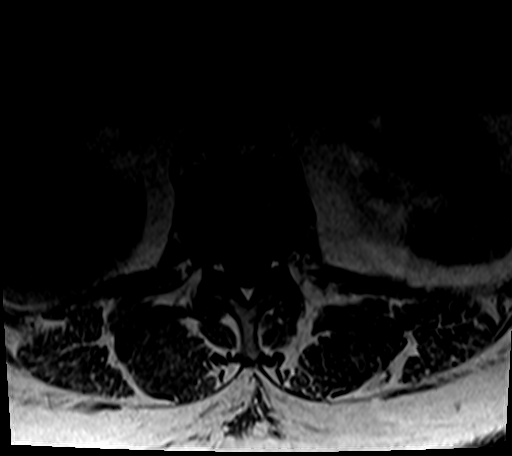

[23 of 48 positions shown; findings below may reference images not displayed]

FINDINGS: Mild intermittent motion degradation.

Segmentation: For the purposes of this dictation, five lumbar
vertebrae are assumed and the caudal most well-formed intervertebral
disc is designated L5-S1.

Alignment:  Trace T12-L1 and L1-L2 grade 1 retrolisthesis.

Vertebrae: Vertebral body height is maintained. No significant
marrow edema or suspicious osseous lesion. Small hemangioma within
the S1 body. Multilevel degenerative endplate irregularity with
small Schmorl nodes.

Conus medullaris and cauda equina: Conus extends to the L1 level. No
signal abnormality within the visualized distal spinal cord.

Paraspinal and other soft tissues: 5.2 cm right renal cyst. Atrophy
of the lumbar paraspinal musculature. Nonspecific edema signal
within the dorsal subcutaneous fat.

Disc levels:

Disc degeneration throughout the lumbar and visualized lower
thoracic spine. Most notably, there is moderate/advanced disc
degeneration at L3-L4.

T11-T12: This level is imaged sagittally. Disc bulge. No significant
spinal canal or foraminal stenosis.

T12-L1: Trace retrolisthesis. Disc bulge. Mild facet arthrosis. No
significant spinal canal stenosis or foraminal narrowing.

L1-L2: Trace retrolisthesis. Disc bulge. Moderate facet arthrosis
(greater on the left) with ligamentum flavum hypertrophy. Mild left
subarticular narrowing without frank nerve root impingement. Central
canal patent. Bilateral neural foraminal narrowing (mild right,
mild/moderate left).

L2-L3: Disc bulge. Superimposed small right subarticular disc
protrusion. Moderate facet arthrosis (greater on the left) with
ligamentum flavum hypertrophy. Mild left subarticular narrowing
without frank nerve root impingement. Mild relative narrowing of the
central canal. Bilateral neural foraminal narrowing (mild right,
mild/moderate left).

L3-L4: Disc bulge with endplate spurring. Advanced facet arthrosis
with ligamentum flavum hypertrophy. Severe spinal canal stenosis
with left greater than right subarticular narrowing. Moderate
bilateral neural foraminal narrowing.

L4-L5: Trace anterolisthesis. Disc uncovering with disc bulge.
Advanced facet arthrosis with ligamentum flavum hypertrophy.
Bilateral subarticular stenosis (severe left, moderate right) with
encroachment upon the descending left L5 nerve root. Mild central
canal stenosis. Moderate/severe left neural foraminal narrowing.

L5-S1: Mild disc bulge asymmetric to the right with mild endplate
spurring. Moderate facet arthrosis (greater on the right). No
significant spinal canal stenosis. Mild/moderate right neural
foraminal narrowing.
IMPRESSION: Lumbar spondylosis as outlined and most notably as follows.

At L3-L4, there is multifactorial severe spinal canal stenosis with
left greater than right subarticular narrowing. Moderate bilateral
neural foraminal narrowing.

At L4-L5, there is multifactorial bilateral subarticular stenosis
(moderate right, severe left) with encroachment upon the descending
left L5 nerve root. Mild central canal stenosis. Moderate/severe
left neural foraminal narrowing.

No more than mild spinal canal stenosis at the remaining levels.
Additional sites of mild and mild/moderate neural foraminal
narrowing as described.

## 2021-05-31 DIAGNOSIS — M25511 Pain in right shoulder: Secondary | ICD-10-CM | POA: Diagnosis not present

## 2021-06-10 ENCOUNTER — Telehealth: Payer: Medicare HMO

## 2021-06-10 ENCOUNTER — Ambulatory Visit (INDEPENDENT_AMBULATORY_CARE_PROVIDER_SITE_OTHER): Payer: Medicare HMO

## 2021-06-10 DIAGNOSIS — M19041 Primary osteoarthritis, right hand: Secondary | ICD-10-CM

## 2021-06-10 DIAGNOSIS — I10 Essential (primary) hypertension: Secondary | ICD-10-CM

## 2021-06-10 DIAGNOSIS — M1A09X0 Idiopathic chronic gout, multiple sites, without tophus (tophi): Secondary | ICD-10-CM

## 2021-06-10 DIAGNOSIS — R7303 Prediabetes: Secondary | ICD-10-CM

## 2021-06-10 DIAGNOSIS — E7800 Pure hypercholesterolemia, unspecified: Secondary | ICD-10-CM

## 2021-06-10 NOTE — Patient Instructions (Signed)
Visit Information  Thank you for taking time to visit with me today. Please don't hesitate to contact me if I can be of assistance to you before our next scheduled telephone appointment.  Following are the goals we discussed today:  (Copy and paste patient goals from clinical care plan here)  Our next appointment is by telephone on 08/08/21 at 1:20 PM   Please call the care guide team at [phone] if you need to cancel or reschedule your appointment.   If you are experiencing a Mental Health or [place] or need someone to talk to, please call [phone] (toll free, 24 hour hotline)   Patient verbalizes understanding of instructions and care plan provided today and agrees to view in [place]. Active MyChart status confirmed with patient.    Barb Merino, RN, BSN, CCM Care Management Coordinator [place] [facility]  Direct Phone: [phone]

## 2021-06-10 NOTE — Chronic Care Management (AMB) (Signed)
Chronic Care Management   CCM RN Visit Note  06/10/2021 Name: Robin BINETTE MRN: [id number] DOB: 1951-03-08  Subjective: Robin Odom is a 71 y.o. year old female who is a primary care patient of Glendale Chard, MD. The care management team was consulted for assistance with disease management and care coordination needs.    Engaged with patient by telephone for follow up visit in response to provider referral for case management and/or care coordination services.   Consent to Services:  The patient was given information about Chronic Care Management services, agreed to services, and gave verbal consent prior to initiation of services.  Please see initial visit note for detailed documentation.   Patient agreed to services and verbal consent obtained.   Assessment: Review of patient past medical history, allergies, medications, health status, including review of consultants reports, laboratory and other test data, was performed as part of comprehensive evaluation and provision of chronic care management services.   SDOH (Social Determinants of Health) assessments and interventions performed:  yes, no acute challenges   CCM Care Plan  Allergies  Allergen Reactions   Codeine Other (See Comments)    Hallucinations   Penicillins Hives    Outpatient Encounter Medications as of 06/10/2021  Medication Sig   allopurinol (ZYLOPRIM) 300 MG tablet Take 1 tablet (300 mg total) by mouth daily.   amLODipine (NORVASC) 10 MG tablet Take 1 tablet (10 mg total) by mouth daily.   Ascorbic Acid (VITAMIN C) 100 MG tablet Take 100 mg by mouth daily.   azelastine (ASTELIN) 0.1 % nasal spray 1-2 sprays each nostril twice daily as needed   B Complex Vitamins (VITAMIN-B COMPLEX) TABS Take by mouth.   BENFOTIAMINE PO Take by mouth 2 (two) times daily.   Calcium Carb-Cholecalciferol (CALCIUM 500 +D) 500-10 MG-MCG TABS Take 1 tablet by mouth daily.   calcium carbonate (OS-CAL) 600 MG TABS tablet Take 600 mg by  mouth daily.   Cholecalciferol (VITAMIN D3) 125 MCG (5000 UT) CAPS Take by mouth.    CITRUS BERGAMOT PO Take 1 tablet by mouth daily. 500 mg   colchicine 0.6 MG tablet Take 0.6 mg by mouth as needed.   furosemide (LASIX) 20 MG tablet TAKE 1 TABLET BY MOUTH EVERY DAY   hydrALAZINE (APRESOLINE) 25 MG tablet TAKE 1 TABLET BY MOUTH TWICE A DAY   magnesium oxide (MAG-OX) 400 MG tablet Take 400 mg by mouth daily.   metoprolol succinate (TOPROL-XL) 50 MG 24 hr tablet TAKE 1 TABLET BY MOUTH DAILY. TAKE WITH OR IMMEDIATELY FOLLOWING A MEAL.   olmesartan (BENICAR) 40 MG tablet TAKE 1 TABLET BY MOUTH EVERY DAY   Omega-3 Fatty Acids (OMEGA 3 PO) Take 2,500 mg by mouth daily. 2 tablets by mouth daily   potassium chloride (MICRO-K) 10 MEQ CR capsule TAKE 1 CAPSULE BY MOUTH EVERY DAY   Semaglutide (RYBELSUS) 3 MG TABS Take one tablet by mouth 26mns before breakfast with 4oz water.   TURMERIC CURCUMIN PO Take by mouth daily.   Zinc 50 MG TABS Take 1 tablet by mouth daily. (Patient not taking: Reported on 05/18/2021)   No facility-administered encounter medications on file as of 06/10/2021.    Patient Active Problem List   Diagnosis Date Noted   History of wheezing 03/18/2020   Osteopenia of multiple sites 10/30/2019   Prediabetes 05/14/2018   Essential hypertension, benign 05/14/2018   Class 3 severe obesity due to excess calories with serious comorbidity and body mass index (BMI) of 40.0 to  44.9 in adult [facility]) 05/14/2018   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 tophus 07/20/2016   Primary osteoarthritis of both hands 07/20/2016   Primary osteoarthritis of both knees 07/20/2016   Primary osteoarthritis of both feet 07/20/2016   DJD (degenerative joint disease), cervical 07/20/2016   Elevated CK 07/20/2016   History of humerus fracture 07/20/2016   History of hypertension 07/20/2016   History of diabetes mellitus 07/20/2016   History of hyperlipidemia 07/20/2016   Chronic rhinitis  10/20/2015   Anosmia 10/20/2015   Mild intermittent asthma 10/20/2015    Conditions to be addressed/monitored: Essential HTN, Prediabetes, Pure Hypercholesterolemia, Idiopathic chronic gout, Primary Osteoarthritis  Care Plan : RN Care Manager Plan of Care  Updates made by Lynne Logan, RN since 06/10/2021 12:00 AM     Problem: Chronic disease education and Care Coordination needs for Essential HTN, Prediabetes, Pure Hypercholesterolemia, Idiopathic chronic gout, Primary Osteoarthritis   Priority: High     Long-Range Goal: Assist with Chronic disease education and Care Coordination needs for Essential HTN, Prediabetes, Pure Hypercholesterolemia, Idiopathic chronic gout, Primary Osteoarthritis   Start Date: 01/31/2021  Expected End Date: 01/31/2022  Recent Progress: On track  Priority: High  Note:   Current Barriers:  Knowledge Deficits related to plan of care for management of Essential HTN, Prediabetes, Pure Hypercholesterolemia, Idiopathic chronic gout, Primary Osteoarthritis Chronic Disease Management support and education needs related to Essential HTN, Prediabetes, Pure Hypercholesterolemia, Idiopathic chronic gout, Primary Osteoarthritis  RNCM Clinical Goal(s):  Patient will demonstrate Ongoing health management independence Essential HTN, Prediabetes, Pure Hypercholesterolemia, Idiopathic chronic gout, Primary Osteoarthritis continue to work with RN Care Manager to address care management and care coordination needs related to  HTN, DMII, and Osteoarthritis through collaboration with RN Care manager, provider, and care team.   Interventions: 1:1 collaboration with primary care provider regarding development and update of comprehensive plan of care as evidenced by provider attestation and co-signature Inter-disciplinary care team collaboration (see longitudinal plan of care) Evaluation of current treatment plan related to  self management and patient's adherence to plan as  established by provider  Hypertension Interventions:  (Status:  Goal on track:  Yes.) Long Term Goal Last practice recorded BP readings:  BP Readings from Last 3 Encounters:  05/18/21 (!) 145/87  01/27/21 122/80  12/30/20 126/68  Most recent eGFR/CrCl:  Lab Results  Component Value Date   EGFR 82 05/18/2021    No components found for: CRCL Evaluation of current treatment plan related to hypertension self management and patient's adherence to plan as established by provider Reviewed medications with patient and discussed importance of compliance Advised patient, providing education and rationale, to monitor blood pressure daily and record, calling PCP for findings outside established parameters Provided education on prescribed diet low Sodium Assessed social determinant of health barriers Discussed plans with patient for ongoing care management follow up and provided patient with direct contact information for care management team  Osteoarthritis/Gout Interventions: Status: (Goal on track:  Yes) Long Term Goal  Evaluation of current treatment plan related to Osteoarthritis, self-management and patient's adherence to plan as established by provider Determined patient continues to have her Osteoarthritis and Gout managed per Rheumatology Reviewed and discussed treatment recommendations following recent follow up: Assessment / Plan:     Visit Diagnoses: 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 tophus - She has not had any signs or symptoms of a gout flare.  She has clinically been doing well taking allopurinol 300 mg 1 tablet by mouth daily.  She continues to tolerate allopurinol without any side effects and has not missed any doses recently.  Her uric acid was within the desirable range: 4.2 on 01/14/2021.  Uric acid, CBC, and CMP will be updated today.  She will remain on the current treatment regimen.  A refill of allopurinol was sent to the pharmacy today.  She was advised to notify  us if she develops signs or symptoms of a flare.  She will follow-up in the office in 6 months.   - Plan: Uric acid  Determined patient verbalizes understanding of her prescribed treatment plan and continues to be physically active including participation in a HEP Discussed plans with patient for ongoing care management follow up and provided patient with direct contact information for care management team  Diabetes Interventions: Status: (Condition stable. Goal not addressed during this visit) Long Term Goal  Assessed patient's understanding of A1c goal:  <5.7 Reviewed medications with patient and discussed importance of medication adherence Educated on dietary and exercise recommendations  Discussed plans with patient for ongoing care management follow up and provided patient with direct contact information for care management team Lab Results  Component Value Date   HGBA1C 5.8 (H) 01/27/2021   Patient Goals/Self-Care Activities: Take all medications as prescribed Attend all scheduled provider appointments Call pharmacy for medication refills 3-7 days in advance of running out of medications Perform all self care activities independently  Perform IADL's (shopping, preparing meals, housekeeping, managing finances) independently Call provider office for new concerns or questions  drink 6 to 8 glasses of water each day manage portion size keep a blood pressure log take blood pressure log to all doctor appointments call doctor for signs and symptoms of high blood pressure take medications for blood pressure exactly as prescribed report new symptoms to your doctor  Follow Up Plan:  Telephone follow up appointment with care management team member scheduled for:  08/08/21      Plan:Telephone follow up appointment with care management team member scheduled for:  08/08/21  Barb Merino, RN, BSN, CCM Care Management Coordinator [place] [facility]  Direct  Phone: [phone]

## 2021-06-14 ENCOUNTER — Telehealth: Payer: Self-pay

## 2021-06-14 NOTE — Chronic Care Management (AMB) (Signed)
° ° °  Called [facility], No answer, left message of appointment on 06-21-2021 at 10:00 via telephone visit with Orlando Penner, Pharm D. Notified to have all medications, supplements, blood pressure and/or blood sugar logs available during appointment and to return call if need to reschedule.  Care Gaps: Yearly foot exam overdue Yearly ophthalmology exam overdue Pneumonia vaccine overdue  Star Rating Drug: Rybelsus 3 mg- Last filled 06-11-2021 30 [facility] Olmesartan 40 mg- Last filled 04-25-2021 90 [facility]  Any gaps in medications fill history? No  [place] Pharmacist Assistant [phone]

## 2021-06-16 ENCOUNTER — Encounter: Payer: Self-pay | Admitting: Internal Medicine

## 2021-06-16 ENCOUNTER — Ambulatory Visit (INDEPENDENT_AMBULATORY_CARE_PROVIDER_SITE_OTHER): Payer: Medicare HMO | Admitting: Internal Medicine

## 2021-06-16 ENCOUNTER — Other Ambulatory Visit: Payer: Self-pay

## 2021-06-16 VITALS — BP 132/80 | HR 68 | Temp 98.0°F | Ht 65.0 in | Wt 228.4 lb

## 2021-06-16 DIAGNOSIS — E2839 Other primary ovarian failure: Secondary | ICD-10-CM | POA: Diagnosis not present

## 2021-06-16 DIAGNOSIS — Z6838 Body mass index (BMI) 38.0-38.9, adult: Secondary | ICD-10-CM | POA: Diagnosis not present

## 2021-06-16 DIAGNOSIS — U099 Post covid-19 condition, unspecified: Secondary | ICD-10-CM | POA: Diagnosis not present

## 2021-06-16 DIAGNOSIS — Z8616 Personal history of COVID-19: Secondary | ICD-10-CM | POA: Diagnosis not present

## 2021-06-16 DIAGNOSIS — R7303 Prediabetes: Secondary | ICD-10-CM | POA: Diagnosis not present

## 2021-06-16 DIAGNOSIS — I10 Essential (primary) hypertension: Secondary | ICD-10-CM | POA: Diagnosis not present

## 2021-06-16 DIAGNOSIS — L309 Dermatitis, unspecified: Secondary | ICD-10-CM

## 2021-06-16 DIAGNOSIS — G9332 Myalgic encephalomyelitis/chronic fatigue syndrome: Secondary | ICD-10-CM

## 2021-06-16 MED ORDER — TRIAMCINOLONE ACETONIDE 0.1 % EX CREA: TOPICAL_CREAM | CUTANEOUS | Status: DC

## 2021-06-16 MED ORDER — CYANOCOBALAMIN 1000 MCG/ML IJ SOLN
1000.0000 ug | Freq: Once | INTRAMUSCULAR | Status: AC
  Administered 2021-06-16: 1000 ug via INTRAMUSCULAR

## 2021-06-16 NOTE — Progress Notes (Signed)
Robin Odom,acting as a Education administrator for Robin Greenland, Odom.,have documented all relevant documentation on the behalf of Robin Greenland, Odom,as directed by  Robin Greenland, Odom while in the presence of Robin Greenland, Odom.  This visit occurred during the Robin Odom public health emergency.  Safety protocols were in place, including screening questions prior to the visit, additional usage of staff PPE, and extensive cleaning of exam room while observing appropriate contact time as indicated for disinfecting solutions.  Subjective:     Patient ID: Robin Odom , female    DOB: May 20, 1950 , 71 y.o.   MRN: [id number]   Chief Complaint  Patient presents with   Prediabetes   Hypertension    HPI  She is here today for a prediabetes/HTN check. Patient reports compliance with her medications. She denies headaches, chest pain and shortness of breath. She is maintaining an active lifestyle - both socially and physically.   Hypertension This is a chronic problem. The current episode started yesterday. The problem has been gradually improving since onset. Risk factors for coronary artery disease include obesity and post-menopausal state. Past treatments include direct vasodilators, angiotensin blockers and diuretics.    Past Medical History:  Diagnosis Date   Arthritis    Diabetes mellitus without complication ([place])    Gout    Hypertension    Partial retinal tear of both eyes without detachment 08/29/2017     Family History  Problem Relation Age of Onset   Heart disease Mother    Hypertension Mother    Diabetes Mother    Arthritis Father    Hypertension Father    Prostate cancer Father    Parkinson's disease Father    Heart disease Father    Sinusitis Sister    Allergic rhinitis Sister    Diabetes Sister    High Cholesterol Sister    Hypertension Sister    Diabetes Brother    Hypertension Brother    High Cholesterol Brother    Hypertension Brother    Angioedema Neg Hx     Asthma Neg Hx    Eczema Neg Hx    Immunodeficiency Neg Hx    Urticaria Neg Hx    Breast cancer Neg Hx      Current Outpatient Medications:    allopurinol (ZYLOPRIM) 300 MG tablet, Take 1 tablet (300 mg total) by mouth daily., Disp: 100 tablet, Rfl: 0   amLODipine (NORVASC) 10 MG tablet, Take 1 tablet (10 mg total) by mouth daily., Disp: 90 tablet, Rfl: 1   Ascorbic Acid (VITAMIN C PO), Take 1,500 mg by mouth daily at 6 (six) AM., Disp: , Rfl:    azelastine (ASTELIN) 0.1 % nasal spray, 1-2 sprays each nostril twice daily as needed, Disp: 30 mL, Rfl: 5   B Complex Vitamins (VITAMIN-B COMPLEX) TABS, Take by mouth., Disp: , Rfl:    BENFOTIAMINE PO, Take by mouth 2 (two) times daily., Disp: , Rfl:    Calcium Carb-Cholecalciferol 500-10 MG-MCG TABS, Take 1 tablet by mouth daily., Disp: , Rfl:    Cholecalciferol (VITAMIN D3) 125 MCG (5000 UT) CAPS, Take by mouth. , Disp: , Rfl:    CITRUS BERGAMOT PO, Take 1 tablet by mouth daily. 500 mg, Disp: , Rfl:    colchicine 0.6 MG tablet, Take 0.6 mg by mouth as needed., Disp: , Rfl:    furosemide (LASIX) 20 MG tablet, TAKE 1 TABLET BY MOUTH EVERY DAY, Disp: 90 tablet, Rfl: 1   hydrALAZINE (APRESOLINE) 25 MG  tablet, TAKE 1 TABLET BY MOUTH TWICE A DAY, Disp: 180 tablet, Rfl: 1   magnesium oxide (MAG-OX) 400 MG tablet, Take 400 mg by mouth daily., Disp: , Rfl:    melatonin 3 MG TABS tablet, Take 3 mg by mouth at bedtime., Disp: , Rfl:    metoprolol succinate (TOPROL-XL) 50 MG 24 hr tablet, TAKE 1 TABLET BY MOUTH DAILY. TAKE WITH OR IMMEDIATELY FOLLOWING A MEAL., Disp: 90 tablet, Rfl: 1   olmesartan (BENICAR) 40 MG tablet, TAKE 1 TABLET BY MOUTH EVERY DAY, Disp: 90 tablet, Rfl: 2   Omega-3 Fatty Acids (OMEGA 3 PO), Take 2,500 mg by mouth daily. 2 tablets by mouth daily, Disp: , Rfl:    potassium chloride (MICRO-K) 10 MEQ CR capsule, TAKE 1 CAPSULE BY MOUTH EVERY DAY, Disp: 90 capsule, Rfl: 1   Semaglutide (RYBELSUS) 3 MG TABS, Take one tablet by mouth 71mns  before breakfast with 4oz water., Disp: 90 tablet, Rfl: 1   TURMERIC CURCUMIN PO, Take by mouth daily., Disp: , Rfl:    triamcinolone cream (KENALOG) 0.1 %, APPLY TO AFFECTED AREA TWICE DAILY AS NEEDED, Disp: 45 g, Rfl: 0   Allergies  Allergen Reactions   Codeine Other (See Comments)    Hallucinations   Penicillins Hives     Review of Systems  Constitutional: Negative.   Respiratory: Negative.    Cardiovascular: Negative.   Gastrointestinal: Negative.   Endocrine: Negative for polydipsia, polyphagia and polyuria.  Skin:  Positive for rash.  Neurological: Negative.   Psychiatric/Behavioral: Negative.  Negative for dysphoric mood.     Today's Vitals   06/16/21 1059  BP: 132/80  Pulse: 68  Temp: 98 F (36.7 C)  Weight: 228 lb 6.4 oz (103.6 kg)  Height: 5' 5"  (1.651 m)   Body mass index is 38.01 kg/m.  Wt Readings from Last 3 Encounters:  06/16/21 228 lb 6.4 oz (103.6 kg)  05/19/21 234 lb (106.1 kg)  05/18/21 234 lb 9.6 oz (106.4 kg)     Objective:  Physical Exam Vitals and nursing note reviewed.  Constitutional:      Appearance: Normal appearance. She is obese.  HENT:     Head: Normocephalic and atraumatic.     Nose:     Comments: Masked     Mouth/Throat:     Comments: Masked  Eyes:     Extraocular Movements: Extraocular movements intact.  Cardiovascular:     Rate and Rhythm: Normal rate and regular rhythm.     Heart sounds: Normal heart sounds.  Pulmonary:     Effort: Pulmonary effort is normal.     Breath sounds: Normal breath sounds.  Musculoskeletal:     Cervical back: Normal range of motion.  Skin:    General: Skin is warm.     Comments: Scaly rash on R fingers, no vesicular lesions noted  Neurological:     General: No focal deficit present.     Mental Status: She is alert.  Psychiatric:        Mood and Affect: Mood normal.        Behavior: Behavior normal.        Assessment And Plan:     1. Prediabetes Comments: Chronic, I will check labs  as listed below. She will rto in 4 months for f/u.  - CMP14+EGFR; Future - Hemoglobin A1c; Future - Lipid panel; Future - Insulin, random(561); Future  2. Essential hypertension, benign Comments: Chronic, fair control. Goal BP<130/80. She is encouraged to follow low sodium  diet. No med changes.  - CMP14+EGFR; Future - Lipid panel; Future - Insulin, random(561); Future  3. Estrogen deficiency Comments: I will refer her to Dr. Mancel Bale for her next bone density exam. She is due in June 2022. - Ambulatory referral to Gynecology  4. Dermatitis Comments: I will send rx triamcinolone cream to affected area twice daily as needed. I will refer her to Derm if needed, if sx persist.   5. Class 2 severe obesity due to excess calories with serious comorbidity and body mass index (BMI) of 38.0 to 38.9 in adult [facility]) Comments: She is congratulated on her 6lb weight loss since Jan 2023. Encouraged to aim for at least 150 minutes of exercise per week.   6. Personal history of COVID-19 Comments: She had COVID in Oct 2022. She is fully vaccinated and boosted x 3.  7. Post-COVID chronic fatigue Comments: She was given vitamin B12 IM x 1.  - cyanocobalamin ((VITAMIN B-12)) injection 1,000 mcg   Patient was given opportunity to ask questions. Patient verbalized understanding of the plan and was able to repeat key elements of the plan. All questions were answered to their satisfaction.   I, Robin Greenland, Odom, have reviewed all documentation for this visit. The documentation on 06/16/21 for the exam, diagnosis, procedures, and orders are all accurate and complete.   IF YOU HAVE BEEN REFERRED TO A SPECIALIST, IT MAY TAKE 1-2 WEEKS TO SCHEDULE/PROCESS THE REFERRAL. IF YOU HAVE NOT HEARD FROM US/SPECIALIST IN TWO WEEKS, PLEASE GIVE [place] A CALL AT [phone] X [phone].   THE PATIENT IS ENCOURAGED TO PRACTICE SOCIAL DISTANCING DUE TO THE COVID-19 PANDEMIC.

## 2021-06-16 NOTE — Patient Instructions (Signed)

## 2021-06-18 MED ORDER — TRIAMCINOLONE ACETONIDE 0.1 % EX CREA: TOPICAL_CREAM | CUTANEOUS | 0 refills | Status: DC

## 2021-06-18 MED ORDER — TRIAMCINOLONE ACETONIDE 0.1 % EX CREA: TOPICAL_CREAM | CUTANEOUS | Status: DC

## 2021-06-20 ENCOUNTER — Other Ambulatory Visit: Payer: Self-pay

## 2021-06-21 ENCOUNTER — Ambulatory Visit: Payer: Medicare HMO

## 2021-06-21 DIAGNOSIS — I10 Essential (primary) hypertension: Secondary | ICD-10-CM

## 2021-06-21 DIAGNOSIS — E782 Mixed hyperlipidemia: Secondary | ICD-10-CM

## 2021-06-21 NOTE — Progress Notes (Signed)
Chronic Care Management Pharmacy Note  06/28/2021 Name:  Robin Odom MRN:  [id number] DOB:  1950/10/30  Summary: Patient reports that she is doing well, she is happy to be down to her pre-covid weight.    Recommendations/Changes made from today's visit: Recommend patient continue current medication regimen and receive Prevnar 20 vaccine.   Plan: Congratulated patient on sticking to her exercise regimen and being proactive in getting her vaccinations accomplished.    Subjective: Robin Odom is an 71 y.o. year old female who is a primary patient of Glendale Chard, MD.  The CCM team was consulted for assistance with disease management and care coordination needs.    Engaged with patient by telephone for follow up visit in response to provider referral for pharmacy case management and/or care coordination services.   Consent to Services:  The patient was given information about Chronic Care Management services, agreed to services, and gave verbal consent prior to initiation of services.  Please see initial visit note for detailed documentation.   Patient Care Team: Glendale Chard, MD as PCP - General (Internal Medicine) Buford Dresser, MD as PCP - Cardiology (Cardiology) Lynne Logan, RN as [place] [facility] Mayford Knife, [facility] (Pharmacist)  Recent office visits: 06/16/2021 PCP OV  01/27/2021 PCP OV 02/28/2021 PCP OV Recent consult visits: 05/18/2021 Rheumatology [facility] visits: None in previous 6 months   Objective:  Lab Results  Component Value Date   CREATININE 0.77 05/18/2021   BUN 16 05/18/2021   EGFR 82 05/18/2021   GFRNONAA 67 05/13/2020   GFRAA 77 05/13/2020   NA 141 05/18/2021   K 4.1 05/18/2021   CALCIUM 10.0 05/18/2021   CO2 32 05/18/2021   GLUCOSE 80 05/18/2021    Lab Results  Component Value Date/Time   HGBA1C 5.8 (H) 01/27/2021 12:29 PM   HGBA1C 5.9 (H) 09/09/2020 03:49 PM   MICROALBUR 10 01/27/2021 11:44  AM   MICROALBUR 10 05/13/2020 11:14 AM    Last diabetic Eye exam: No results found for: HMDIABEYEEXA  Last diabetic Foot exam: No results found for: HMDIABFOOTEX   Lab Results  Component Value Date   CHOL 172 01/27/2021   HDL 56 01/27/2021   LDLCALC 102 (H) 01/27/2021   TRIG 72 01/27/2021   CHOLHDL 3.1 01/27/2021    Hepatic Function Latest Ref Rng & Units 05/18/2021 01/14/2021 07/08/2020  Total Protein 6.1 - 8.1 g/dL 7.0 7.0 7.0  Albumin 3.8 - 4.8 g/dL - - 4.4  AST 10 - 35 U/L _0 ALT 6 - 29 U/L _1 Alk Phosphatase 44 - 121 IU/L - - 71  Total Bilirubin 0.2 - 1.2 mg/dL 0.3 0.5 0.3    No results found for: TSH, FREET4  CBC Latest Ref Rng & Units 05/18/2021 01/14/2021 07/08/2020  WBC 3.8 - 10.8 Thousand/uL 9.1 9.4 10.5  Hemoglobin 11.7 - 15.5 g/dL 13.1 13.1 13.0  Hematocrit 35.0 - 45.0 % 39.8 41.8 40.8  Platelets 140 - 400 Thousand/uL 334 309 340    Lab Results  Component Value Date/Time   VD25OH 92.7 01/27/2021 12:29 PM    Clinical ASCVD: Yes  The 10-year ASCVD risk score (Arnett DK, et al., 2019) is: 44.8%   Values used to calculate the score:     Age: 71 years     Sex: Female     Is Non-Hispanic African American: Yes     Diabetic: Yes     Tobacco smoker:  Systolic Blood Pressure: 132 mmHg °    Is BP treated: Yes °    HDL Cholesterol: 56 mg/dL °    Total Cholesterol: 172 mg/dL   ° °Depression screen PHQ 2/9 05/19/2021 05/13/2020 04/16/2019  °Decreased Interest 0 0 0  °Down, Depressed, Hopeless 0 0 0  °PHQ - 2 Score 0 0 0  °Altered sleeping - - 3  °Tired, decreased energy - - 0  °Change in appetite - - 0  °Feeling bad or failure about yourself  - - 0  °Trouble concentrating - - 0  °Moving slowly or fidgety/restless - - 0  °Suicidal thoughts - - 0  °PHQ-9 Score - - 3  °Difficult doing work/chores - - Not difficult at all  °  ° °Social History  ° °Tobacco Use  °Smoking Status Never  °Smokeless Tobacco Never  ° °BP Readings from Last 3 Encounters:  °06/16/21 132/80   °05/18/21 (!) 145/87  °01/27/21 122/80  ° °Pulse Readings from Last 3 Encounters:  °06/16/21 68  °05/18/21 67  °01/27/21 94  ° °Wt Readings from Last 3 Encounters:  °06/16/21 228 lb 6.4 oz (103.6 kg)  °05/19/21 234 lb (106.1 kg)  °05/18/21 234 lb 9.6 oz (106.4 kg)  ° °BMI Readings from Last 3 Encounters:  °06/16/21 38.01 kg/m²  °05/19/21 38.35 kg/m²  °05/18/21 39.04 kg/m²  ° ° °Assessment/Interventions: Review of patient past medical history, allergies, medications, health status, including review of consultants reports, laboratory and other test data, was performed as part of comprehensive evaluation and provision of chronic care management services.  ° °SDOH:  (Social Determinants of Health) assessments and interventions performed: No ° °SDOH Screenings  ° °Alcohol Screen: Not on file  °Depression (PHQ2-9): Low Risk   ° PHQ-2 Score: 0  °Financial Resource Strain: Low Risk   ° Difficulty of Paying Living Expenses: Not hard at all  °Food Insecurity: No Food Insecurity  ° Worried About Running Out of Food in the Last Year: Never true  ° Ran Out of Food in the Last Year: Never true  °Housing: Low Risk   ° Last Housing Risk Score: 0  °Physical Activity: Sufficiently Active  ° Days of Exercise per Week: 5 days  ° Minutes of Exercise per Session: 30 min  °Social Connections: Not on file  °Stress: No Stress Concern Present  ° Feeling of Stress : Not at all  °Tobacco Use: Low Risk   ° Smoking Tobacco Use: Never  ° Smokeless Tobacco Use: Never  ° Passive Exposure: Not on file  °Transportation Needs: No Transportation Needs  ° Lack of Transportation (Medical): No  ° Lack of Transportation (Non-Medical): No  ° ° °CCM Care Plan ° °Allergies  °Allergen Reactions  ° Benadryl [Diphenhydramine] Other (See Comments)  °  Temporary Amnesia   ° Codeine Other (See Comments)  °  Hallucinations  ° Penicillins Hives  ° ° °Medications Reviewed Today   ° ° Reviewed by Pearson, Vallie J, RPH (Pharmacist) on 06/21/21 at 1043  Med List  Status: <None>  ° °Medication Order Taking? Sig Documenting Provider Last Dose Status Informant  °allopurinol (ZYLOPRIM) 300 MG tablet [id number] No Take 1 tablet (300 mg total) by mouth daily. Dale, Taylor M, PA-C Taking Active   °amLODipine (NORVASC) 10 MG tablet [id number] No Take 1 tablet (10 mg total) by mouth daily. Sanders, Robyn, MD Taking Active   °Ascorbic Acid (VITAMIN C PO) [id number] No Take 1,500 mg by mouth daily at 6 (six) AM. [provider] Taking   Active   °azelastine (ASTELIN) 0.1 % nasal spray [id number] No 1-2 sprays each nostril twice daily as needed Bobbitt, Ralph Carter, MD Taking Active   °B Complex Vitamins (VITAMIN-B COMPLEX) TABS [id number] No Take by mouth. [provider] Taking Active   °         °Med Note (GEORGE, STEPHANIE A   Mon Aug 02, 2020  2:55 PM)    °BENFOTIAMINE PO [id number] No Take by mouth 2 (two) times daily. [provider] Taking Active   °Calcium Carb-Cholecalciferol 500-10 MG-MCG TABS [id number] No Take 1 tablet by mouth daily. [provider] Taking Active Self  °Cholecalciferol (VITAMIN D3) 125 MCG (5000 UT) CAPS [id number] No Take by mouth.  [provider] Taking Active   °         °Med Note (GEORGE, STEPHANIE A   Mon Aug 02, 2020  2:55 PM)    °CITRUS BERGAMOT PO [id number] No Take 1 tablet by mouth daily. 500 mg [provider] Taking Active Self  °colchicine 0.6 MG tablet [id number] No Take 0.6 mg by mouth as needed. [provider] Taking Active   °furosemide (LASIX) 20 MG tablet [id number] No TAKE 1 TABLET BY MOUTH EVERY DAY Sanders, Robyn, MD Taking Active   °hydrALAZINE (APRESOLINE) 25 MG tablet [id number] No TAKE 1 TABLET BY MOUTH TWICE A DAY Sanders, Robyn, MD Taking Active   °magnesium oxide (MAG-OX) 400 MG tablet [id number] No Take 400 mg by mouth daily. [provider] Taking Active   °melatonin 3 MG TABS tablet [id number] No Take 3 mg by mouth at bedtime. [provider] Taking Active    °metoprolol succinate (TOPROL-XL) 50 MG 24 hr tablet [id number] No TAKE 1 TABLET BY MOUTH DAILY. TAKE WITH OR IMMEDIATELY FOLLOWING A MEAL. Sanders, Robyn, MD Taking Active   °olmesartan (BENICAR) 40 MG tablet [id number] No TAKE 1 TABLET BY MOUTH EVERY DAY Sanders, Robyn, MD Taking Active   °Omega-3 Fatty Acids (OMEGA 3 PO) [id number] No Take 2,500 mg by mouth daily. 2 tablets by mouth daily [provider] Taking Active   °potassium chloride (MICRO-K) 10 MEQ CR capsule [id number] No TAKE 1 CAPSULE BY MOUTH EVERY DAY Sanders, Robyn, MD Taking Active   °Semaglutide (RYBELSUS) 3 MG TABS [id number] No Take one tablet by mouth 30mins before breakfast with 4oz water. Sanders, Robyn, MD Taking Active   °triamcinolone cream (KENALOG) 0.1 % [id number]  APPLY TO AFFECTED AREA TWICE DAILY AS NEEDED Sanders, Robyn, MD  Active   °TURMERIC CURCUMIN PO [id number] No Take by mouth daily. [provider] Taking Active   ° °  °  ° °  ° ° °Patient Active Problem List  ° Diagnosis Date Noted  ° History of wheezing 03/18/2020  ° Osteopenia of multiple sites 10/30/2019  ° Prediabetes 05/14/2018  ° Essential hypertension, benign 05/14/2018  ° Class 3 severe obesity due to excess calories with serious comorbidity and body mass index (BMI) of 40.0 to 44.9 in adult (HCC) 05/14/2018  ° 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 tophus 07/20/2016  ° Primary osteoarthritis of both hands 07/20/2016  ° Primary osteoarthritis of both knees 07/20/2016  ° Primary osteoarthritis of both feet 07/20/2016  ° DJD (degenerative joint disease), cervical 07/20/2016  ° Elevated CK 07/20/2016  ° History of humerus fracture 07/20/2016  ° History of hypertension 07/20/2016  ° History of diabetes mellitus 07/20/2016  ° History of hyperlipidemia 07/20/2016  ° Chronic rhinitis 10/20/2015  ° Anosmia 10/20/2015  ° Mild intermittent   10/20/2015   Mild intermittent asthma 10/20/2015    Immunization History  Administered Date(s) Administered   Influenza, High Dose Seasonal PF  02/05/2017, 02/12/2018, 12/16/2018, 01/13/2020, 01/18/2021   Influenza,inj,quad, With Preservative 02/13/2017, 02/01/2018   Influenza-Unspecified 02/03/2015, 01/18/2018, 01/29/2018, 12/16/2018   PFIZER(Purple Top)SARS-COV-2 Vaccination 01/01/2019, 01/23/2019, 08/19/2019, 08/13/2020   Pfizer Covid-19 Vaccine Bivalent Booster 36yr & up 04/03/2021   Pneumococcal Conjugate-13 01/20/2014   Pneumococcal Polysaccharide-23 10/09/2016   Tdap 12/13/2020   Unspecified SARS-COV-2 Vaccination 08/20/2019   Zoster Recombinat (Shingrix) 10/18/2019, 03/05/2020    Conditions to be addressed/monitored:  Hypertension and Hyperlipidemia  Care Plan : [place] Updates made by PMayford Knife RPH since 06/28/2021 12:00 AM     Problem: HTN, HLD   Priority: High     Long-Range Goal: Disease Management   Start Date: 10/20/2020  Recent Progress: On track  Priority: High  Note:   Current Barriers:  Unable to achieve control of cholesterol   Pharmacist Clinical Goal(s):  Patient will achieve adherence to monitoring guidelines and medication adherence to achieve therapeutic efficacy through collaboration with PharmD and provider.   Interventions: 1:1 collaboration with SGlendale Chard MD regarding development and update of comprehensive plan of care as evidenced by provider attestation and co-signature Inter-disciplinary care team collaboration (see longitudinal plan of care) Comprehensive medication review performed; medication list updated in electronic medical record  Hypertension (BP goal <130/80) -Controlled -Current treatment: Amlodipine 10 mg tablet once per day Appropriate, Effective, Safe, Accessible Hydralazine 25 mg tablet twice per day Appropriate, Effective, Safe, Accessible Metoprolol Succinate take 1 tablet by mouth daily Appropriate, Effective, Safe, Accessible Olmesartan 40 mg tablet by mouth every day Appropriate, Effective, Safe, Accessible -Current home readings:  120/70, 119/66 - unless she has a timing issue with her medication BP reading last night was 132/78,  -Current dietary habits: eating plenty of vegetables,  -Current exercise habits: please hyperlipidemia  -Denies hypotensive/hypertensive symptoms -Educated on Importance of home blood pressure monitoring; Symptoms of hypotension and importance of maintaining adequate hydration;  -Dizziness, light-headedness, fainting, unsteadiness, blurred vision, heartbeats become more     noticeable, confusion, tiredness, nausea -Counseled to monitor BP at home at least four times per week, document, and provide log at future appointments   Hyperlipidemia: (LDL goal < 70) -Uncontrolled -Current treatment: Omega 3 Fatty acids - take 2 tablets by mouth daily Appropriate, Query Effective -Current dietary patterns: she has not eaten red meat in over a year, she treats herself to bacon or pork chop once a quarter. She is only using vegan butter. She is mostly eating chicken and fish  -Current exercise habits: She is working out with her trainer twice per week, and then she is doing other things the other 3 days. She is currently working out on the nustep bike that uses less pressure on the knees, it is normally used in physical therapy. She is still considering going back to water aerobics.  -She is consuming 8-10 vegetables per day and plenty of high fiber foods including broccoli and and brussels sprouts  -At this time Ms. Desir is going to to continue to do lifestyle changes to regulate her cholesterol.  -Educated on Cholesterol goals;  -Recommended she continue her diet and lifestyle changes.   Patient Goals/Self-Care Activities Patient will:  - take medications as prescribed as evidenced by patient report and record review  Follow Up Plan: The patient has been provided with contact information for the care management team and has been advised to call  or concerns.  °  °   ° °Medication Assistance: None required.  Patient affirms current coverage meets needs. ° °Compliance/Adherence/Medication fill history: °Care Gaps: °Ophthalmology Exam - patient received eye exam in December, Dr. Oglethorpe, she is going to see him in March or April, Nathan Haines is the ophthalmologist  ° °[facility] Drugs: °Rybelsus 3 mg tablet  ° °Patient's preferred pharmacy is: ° °[id number] - [place], [place] - [address] °[address] °[place] [address] °Phone: [phone] Fax: [phone] ° °Uses pill box? Yes °Pt endorses 95% compliance ° °We discussed: Benefits of medication synchronization, packaging and delivery as well as enhanced pharmacist oversight with [employer]. °Patient decided to: Continue current medication management strategy ° °Care Plan and Follow Up Patient Decision:  Patient agrees to Care Plan and Follow-up. ° °Plan: The patient has been provided with contact information for the care management team and has been advised to call with any health related questions or concerns.  ° °Vallie Pearson, CPP, PharmD °Clinical Pharmacist Practitioner °[facility] °[phone] ° ° ° ° °

## 2021-06-27 DIAGNOSIS — S99922A Unspecified injury of left foot, initial encounter: Secondary | ICD-10-CM | POA: Diagnosis not present

## 2021-06-28 DIAGNOSIS — M19041 Primary osteoarthritis, right hand: Secondary | ICD-10-CM

## 2021-06-28 DIAGNOSIS — I10 Essential (primary) hypertension: Secondary | ICD-10-CM | POA: Diagnosis not present

## 2021-06-28 DIAGNOSIS — M19042 Primary osteoarthritis, left hand: Secondary | ICD-10-CM | POA: Diagnosis not present

## 2021-06-28 DIAGNOSIS — E782 Mixed hyperlipidemia: Secondary | ICD-10-CM

## 2021-06-28 DIAGNOSIS — E7800 Pure hypercholesterolemia, unspecified: Secondary | ICD-10-CM | POA: Diagnosis not present

## 2021-06-28 NOTE — Patient Instructions (Signed)
Visit Information It was great speaking with you today!  Please let me know if you have any questions about our visit.   Goals Addressed             This Visit's Progress    Manage My Medicine       Timeframe:  Long-Range Goal Priority:  High Start Date:                             Expected End Date:                       Follow Up Date: 12/11/2021  In Progress:  - call for medicine refill 2 or 3 days before it runs out - call if I am sick and can't take my medicine - learn to read medicine labels - use a pillbox to sort medicine - use an alarm clock or phone to remind me to take my medicine    Why is this important?   These steps will help you keep on track with your medicines.         Patient Care Plan: CCM Pharmacy Care Plan     Problem Identified: HTN, HLD   Priority: High     Long-Range Goal: Disease Management   Start Date: 10/20/2020  Recent Progress: On track  Priority: High  Note:   Current Barriers:  Unable to achieve control of cholesterol   Pharmacist Clinical Goal(s):  Patient will achieve adherence to monitoring guidelines and medication adherence to achieve therapeutic efficacy through collaboration with PharmD and provider.   Interventions: 1:1 collaboration with Glendale Chard, MD regarding development and update of comprehensive plan of care as evidenced by provider attestation and co-signature Inter-disciplinary care team collaboration (see longitudinal plan of care) Comprehensive medication review performed; medication list updated in electronic medical record  Hypertension (BP goal <130/80) -Controlled -Current treatment: Amlodipine 10 mg tablet once per day Appropriate, Effective, Safe, Accessible Hydralazine 25 mg tablet twice per day Appropriate, Effective, Safe, Accessible Metoprolol Succinate take 1 tablet by mouth daily Appropriate, Effective, Safe, Accessible Olmesartan 40 mg tablet by mouth every day Appropriate, Effective, Safe,  Accessible -Current home readings: 120/70, 119/66 - unless she has a timing issue with her medication BP reading last night was 132/78,  -Current dietary habits: eating plenty of vegetables,  -Current exercise habits: please hyperlipidemia  -Denies hypotensive/hypertensive symptoms -Educated on Importance of home blood pressure monitoring; Symptoms of hypotension and importance of maintaining adequate hydration;  -Dizziness, light-headedness, fainting, unsteadiness, blurred vision, heartbeats become more     noticeable, confusion, tiredness, nausea -Counseled to monitor BP at home at least four times per week, document, and provide log at future appointments   Hyperlipidemia: (LDL goal < 70) -Uncontrolled -Current treatment: Omega 3 Fatty acids - take 2 tablets by mouth daily Appropriate, Query Effective -Current dietary patterns: she has not eaten red meat in over a year, she treats herself to bacon or pork chop once a quarter. She is only using vegan butter. She is mostly eating chicken and fish  -Current exercise habits: She is working out with her trainer twice per week, and then she is doing other things the other 3 days. She is currently working out on the nustep bike that uses less pressure on the knees, it is normally used in physical therapy. She is still considering going back to water aerobics.  -She is consuming 8-10 vegetables per day  and plenty of high fiber foods including broccoli and and brussels sprouts  -At this time Ms. Bruins is going to to continue to do lifestyle changes to regulate her cholesterol.  -Educated on Cholesterol goals;  -Recommended she continue her diet and lifestyle changes.   Patient Goals/Self-Care Activities Patient will:  - take medications as prescribed as evidenced by patient report and record review  Follow Up Plan: The patient has been provided with contact information for the care management team and has been advised to call with any health  related questions or concerns.      Patient agreed to services and verbal consent obtained.   The patient verbalized understanding of instructions, educational materials, and care plan provided today and agreed to receive a mailed copy of patient instructions, educational materials, and care plan.   Orlando Penner, PharmD Clinical Pharmacist [facility] [phone]

## 2021-06-30 ENCOUNTER — Other Ambulatory Visit: Payer: Self-pay | Admitting: Internal Medicine

## 2021-06-30 DIAGNOSIS — I10 Essential (primary) hypertension: Secondary | ICD-10-CM

## 2021-07-04 DIAGNOSIS — G4733 Obstructive sleep apnea (adult) (pediatric): Secondary | ICD-10-CM | POA: Diagnosis not present

## 2021-07-05 ENCOUNTER — Encounter (HOSPITAL_BASED_OUTPATIENT_CLINIC_OR_DEPARTMENT_OTHER): Payer: Self-pay | Admitting: Cardiology

## 2021-07-05 ENCOUNTER — Encounter (HOSPITAL_BASED_OUTPATIENT_CLINIC_OR_DEPARTMENT_OTHER): Payer: Self-pay

## 2021-07-05 ENCOUNTER — Other Ambulatory Visit: Payer: Self-pay

## 2021-07-05 ENCOUNTER — Ambulatory Visit (HOSPITAL_BASED_OUTPATIENT_CLINIC_OR_DEPARTMENT_OTHER): Payer: Medicare HMO | Admitting: Cardiology

## 2021-07-05 VITALS — BP 142/88 | HR 65 | Ht 65.0 in | Wt 234.2 lb

## 2021-07-05 DIAGNOSIS — M25471 Effusion, right ankle: Secondary | ICD-10-CM | POA: Diagnosis not present

## 2021-07-05 DIAGNOSIS — Z6838 Body mass index (BMI) 38.0-38.9, adult: Secondary | ICD-10-CM | POA: Diagnosis not present

## 2021-07-05 DIAGNOSIS — Z9189 Other specified personal risk factors, not elsewhere classified: Secondary | ICD-10-CM | POA: Diagnosis not present

## 2021-07-05 DIAGNOSIS — I10 Essential (primary) hypertension: Secondary | ICD-10-CM

## 2021-07-05 DIAGNOSIS — M25472 Effusion, left ankle: Secondary | ICD-10-CM | POA: Diagnosis not present

## 2021-07-05 DIAGNOSIS — Z8249 Family history of ischemic heart disease and other diseases of the circulatory system: Secondary | ICD-10-CM | POA: Diagnosis not present

## 2021-07-05 MED ORDER — CARVEDILOL 6.25 MG PO TABS: 6.2500 mg | ORAL_TABLET | Freq: Two times a day (BID) | ORAL | 3 refills | Status: DC

## 2021-07-05 NOTE — Patient Instructions (Signed)
Medication Instructions:  ?We are changing the metoprolol to carvedilol. Stop the metoprolol. Start carvedilol twice a day. We will start the carvedilol dose at 6.25 mg twice a day--we can increase this if needed. ? ?Once the carvedilol is in your system (about a week, or when you see your BP decreasing), we will come off the hydralazine. Start by changing to only evening hydralazine for a week, and if BP is still good, then stop hydralazine entirely. ? ?If your blood pressure goes up so that it is consistently more than 140/90, please call.  ? ?*If you need a refill on your cardiac medications before your next appointment, please call your pharmacy* ? ? ?Lab Work: ?None ordered today ? ? ?Testing/Procedures: ?None ordered today ? ? ?Follow-Up: ?At [facility], you and your health needs are our priority.  As part of our continuing mission to provide you with exceptional heart care, we have created designated Provider Care Teams.  These Care Teams include your primary Cardiologist (physician) and Advanced Practice Providers (APPs -  Physician Assistants and Nurse Practitioners) who all work together to provide you with the care you need, when you need it. ? ?We recommend signing up for the patient portal called "MyChart".  Sign up information is provided on this After Visit Summary.  MyChart is used to connect with patients for Virtual Visits (Telemedicine).  Patients are able to view lab/test results, encounter notes, upcoming appointments, etc.  Non-urgent messages can be sent to your provider as well.   ?To learn more about what you can do with MyChart, go to [email or web address].   ? ?Your next appointment:   ?3 month(s) ? ?The format for your next appointment:   ?In Person ? ?Provider:   ?Buford Dresser, MD{ ? ? ?

## 2021-07-05 NOTE — Progress Notes (Signed)
Cardiology Office Note:    Date:  07/05/2021   ID:  Robin Odom, DOB 01-11-1951, MRN [id number]  PCP:  Glendale Chard, MD  Cardiologist:  Buford Dresser, MD  Referring MD: Glendale Chard, MD   CC: follow up  History of Present Illness:    Robin Odom is a 71 y.o. female with a hx of hypertension, asthma who is seen for follow up today. I initially met her 08/02/20 as a new consult at the request of Glendale Chard, MD for the evaluation and management of dyspnea on exertion.  CV risk: hypertension, hyperlipidemia, family history of heart disease. OSA on CPAP.  CV history: Treadmill stress with hypertensive response to exercise; echo also with diastolic dysfunction and mildly elevated filling pressure based on tissue doppler. LVEF normal, no significant valve disease.  Today: She is doing well overall. However, she is frustrated with her swollen ankles which she relates to taking amlodipine. She had an episode of swelling lasting 4 days. She described her ankles during this episode as "looking like tree trunks". Typically, her R ankle will swell more than her L.   At home, her blood pressure is more elevated in the morning. Recently, her blood pressure has been higher because of general life stress. Her lowest reading was 106/60. Her normal resting heart rate ranges from 60 to 70 bpm. She notices her blood pressure elevates when she is in pain or after taking pain medication.   In the morning, she takes olmesartan and metoprolol. She takes Lasix and hydralazine at 1 PM. She takes the last hydralazine at dinner time and her amlodipine later at night. She denies taking carvedilol.  She walks on the treadmill but does not enjoy it. She is able to walk quickly with a grocery cart and has been told she may benefit from using a walker.  She was infected with COVID in early October 2022. Since her COVID infection, she endorses a constant brain fog.   Denies chest pain, shortness of breath at  rest or with normal exertion. No PND, orthopnea, or unexpected weight gain. No syncope or palpitations.  Past Medical History:  Diagnosis Date   Arthritis    Diabetes mellitus without complication ([place])    Gout    Hypertension    Partial retinal tear of both eyes without detachment 08/29/2017    Past Surgical History:  Procedure Laterality Date   ABDOMINAL HYSTERECTOMY  1991   CARPAL TUNNEL RELEASE Right 2019   COLONOSCOPY     5 between 1994-2010   Left knee surgery  2006   ROTATOR CUFF REPAIR Left 2001   ROTATOR CUFF REPAIR Right 2003   SHOULDER SURGERY  2001/2003   TONSILECTOMY/ADENOIDECTOMY WITH MYRINGOTOMY  1971    Current Medications: Current Outpatient Medications on File Prior to Visit  Medication Sig   allopurinol (ZYLOPRIM) 300 MG tablet Take 1 tablet (300 mg total) by mouth daily.   amLODipine (NORVASC) 10 MG tablet Take 1 tablet (10 mg total) by mouth daily.   Ascorbic Acid (VITAMIN C PO) Take 1,500 mg by mouth daily at 6 (six) AM.   azelastine (ASTELIN) 0.1 % nasal spray 1-2 sprays each nostril twice daily as needed   B Complex Vitamins (VITAMIN-B COMPLEX) TABS Take by mouth.   BENFOTIAMINE PO Take by mouth 2 (two) times daily.   Calcium Carb-Cholecalciferol 500-10 MG-MCG TABS Take 1 tablet by mouth daily.   Cholecalciferol (VITAMIN D3) 125 MCG (5000 UT) CAPS Take by mouth.  CITRUS BERGAMOT PO Take 1 tablet by mouth daily. 500 mg   colchicine 0.6 MG tablet Take 0.6 mg by mouth as needed.   Cyanocobalamin (B-12) 5000 MCG CAPS Take 1 capsule by mouth daily at 12 noon.   furosemide (LASIX) 20 MG tablet TAKE 1 TABLET BY MOUTH EVERY DAY   magnesium oxide (MAG-OX) 400 MG tablet Take 400 mg by mouth daily.   melatonin 3 MG TABS tablet Take 3 mg by mouth at bedtime.   olmesartan (BENICAR) 40 MG tablet TAKE 1 TABLET BY MOUTH EVERY DAY   Omega-3 Fatty Acids (OMEGA 3 PO) Take 2,500 mg by mouth daily. 2 tablets by mouth daily   potassium chloride (MICRO-K) 10 MEQ CR  capsule TAKE 1 CAPSULE BY MOUTH EVERY DAY   Semaglutide (RYBELSUS) 3 MG TABS Take one tablet by mouth 16mns before breakfast with 4oz water.   triamcinolone cream (KENALOG) 0.1 % APPLY TO AFFECTED AREA TWICE DAILY AS NEEDED   TURMERIC CURCUMIN PO Take by mouth daily.   No current facility-administered medications on file prior to visit.     Allergies:   Benadryl [diphenhydramine], Codeine, and Penicillins   Social History   Tobacco Use   Smoking status: Never   Smokeless tobacco: Never  Vaping Use   Vaping Use: Never used  Substance Use Topics   Alcohol use: Yes    Comment: rarely   Drug use: No    Family History: family history includes Allergic rhinitis in her sister; Arthritis in her father; Diabetes in her brother, mother, and sister; Heart disease in her father and mother; High Cholesterol in her brother and sister; Hypertension in her brother, brother, father, mother, and sister; Parkinson's disease in her father; Prostate cancer in her father; Sinusitis in her sister. There is no history of Angioedema, Asthma, Eczema, Immunodeficiency, Urticaria, or Breast cancer.   3/4 of her siblings has HTN. 2/4 of her siblings has DM. Mother had Type II DM(most of maternal siblings had DM). Mother had unknown CAD and underwent a bypass surgery three years prior to death of a MI in 166 Most of her maternal sibling died from MI. Father had Type II  DM(Paternal sibling no DM). Unaware of any strokes on her fathers side, mostly cancer.    ROS:   Please see the history of present illness.   (+) LE edema (Bilateral ankles but R > L) (+) Brain fog Additional pertinent ROS otherwise unremarkable.  EKGs/Labs/Other Studies Reviewed:    The following studies were reviewed today:  Echo 08/27/20 1. Left ventricular ejection fraction, by estimation, is 60 to 65%. The  left ventricle has normal function. The left ventricle has no regional  wall motion abnormalities. Left ventricular diastolic  parameters are  consistent with Grade I diastolic  dysfunction (impaired relaxation). Elevated left atrial pressure. The E/e'  is 130   2. Right ventricular systolic function is normal. The right ventricular  size is normal.   3. Left atrial size was mildly dilated.   4. The mitral valve is normal in structure. No evidence of mitral valve  regurgitation. No evidence of mitral stenosis.   5. The aortic valve is normal in structure. Aortic valve regurgitation is  not visualized. No aortic stenosis is present.   6. The inferior vena cava is normal in size with greater than 50%  respiratory variability, suggesting right atrial pressure of 3 mmHg.   ETT 08/18/20  Blood pressure demonstrated a hypertensive response to exercise. There was no ST segment  deviation noted during stress.   Normal ETT HTN response to exercise   EKG:  EKG was not ordered today 12/30/20: NSR at 69 bpm, PRWP  07/31/20: NSR at 74 bpm  Recent Labs: 07/13/2020: BNP 21.9 05/18/2021: ALT 14; BUN 16; Creat 0.77; Hemoglobin 13.1; Platelets 334; Potassium 4.1; Sodium 141   Recent Lipid Panel    Component Value Date/Time   CHOL 172 01/27/2021 1229   TRIG 72 01/27/2021 1229   HDL 56 01/27/2021 1229   CHOLHDL 3.1 01/27/2021 1229   LDLCALC 102 (H) 01/27/2021 1229    Physical Exam:    VS:  BP (!) 142/88 (BP Location: Left Arm, Patient Position: Sitting, Cuff Size: Large)    Pulse 65    Ht 5' 5"  (1.651 m)    Wt 234 lb 3.2 oz (106.2 kg)    SpO2 96%    BMI 38.97 kg/m     Wt Readings from Last 3 Encounters:  07/05/21 234 lb 3.2 oz (106.2 kg)  06/16/21 228 lb 6.4 oz (103.6 kg)  05/19/21 234 lb (106.1 kg)    GEN: Well nourished, well developed in no acute distress HEENT: Normal, moist mucous membranes NECK: No JVD CARDIAC: regular rhythm, normal S1 and S2, no rubs or gallops. No murmur. VASCULAR: Radial and DP pulses 2+ bilaterally. No carotid bruits RESPIRATORY:  Clear to auscultation without rales, wheezing or  rhonchi  ABDOMEN: Soft, non-tender, non-distended MUSCULOSKELETAL:  Ambulates independently SKIN: Warm and dry, trace bilateral ankle edema, bilateral ankle bursa NEUROLOGIC:  Alert and oriented x 3. No focal neuro deficits noted. PSYCHIATRIC:  Normal affect     ASSESSMENT:    1. Essential hypertension   2. Swelling of both ankles   3. At increased risk for cardiovascular disease   4. Class 2 severe obesity due to excess calories with serious comorbidity and body mass index (BMI) of 38.0 to 38.9 in adult ([place])   5. Family history of heart disease      PLAN:    Hypertension: goal <130/80 -Both echo and treadmill stress suggest that elevated blood pressure may be causing some of her findings, both diastolic dysfunction and hypertensive BP response to exercise. Otherwise, structurally normal heart, no ischemia  Her LE swelling is predominantly ankle bursa, with only trace edema. Will continue amlodipine for now, work to wean hydralazine. Home blood pressures reviewed, well controlled.  Current medications Amlodipine 10 mg daily (at night) Furosemide 20 mg (usually once/day, two if swollen) Hydralazine 25 mg BID (noon and 5-6 PM)--stopping this Metoprolol succinate 58m daily (in the morning)--changing to carvedilol Olmesartan 40 mg daily (in the morning) Never been on spironolactone. Consider if additional med needed after weaning hydralazine -she will contact me via mychart with BP readings and we will adjust regimen accordingly. -discussed exercise, sodium, how lifestyle can also affect BP  Hyperlipidemia: Elevated ASCVD risk score -per K[facility], Tchol 188, HDL 54, LDL 122, TG 63 07/08/20 -we discussed her elevated ASCVD risk and recommendations for statins. She declines.  Class 2 obesity: working on weight loss, BMI 38 today  Cardiac risk counseling and prevention recommendations: -recommend heart healthy/Mediterranean diet, with whole grains, fruits, vegetable, fish, lean meats,  nuts, and olive oil. Limit salt. -recommend moderate walking, 3-5 times/week for 30-50 minutes each session. Aim for at least 150 minutes.week. Goal should be pace of 3 miles/hours, or walking 1.5 miles in 30 minutes -recommend avoidance of tobacco products. Avoid excess alcohol. -ASCVD risk score: The 10-year ASCVD risk score (Arnett DK, et  al., 2019) is: 49.4%   Values used to calculate the score:     Age: 33 years     Sex: Female     Is Non-Hispanic African American: Yes     Diabetic: Yes     Tobacco smoker: Yes     Systolic Blood Pressure: 175 mmHg     Is BP treated: Yes     HDL Cholesterol: 56 mg/dL     Total Cholesterol: 172 mg/dL    Plan for follow up: 3 months or sooner as needed  Buford Dresser, MD, [name], [facility] at [facility] at [facility] [address], [place], [place] [address] [phone]   Medication Adjustments/Labs and Tests Ordered: Current medicines are reviewed at length with the patient today.  Concerns regarding medicines are outlined above.   No orders of the defined types were placed in this encounter.   Meds ordered this encounter  Medications   carvedilol (COREG) 6.25 MG tablet    Sig: Take 1 tablet (6.25 mg total) by mouth 2 (two) times daily.    Dispense:  180 tablet    Refill:  3     Patient Instructions  Medication Instructions:  We are changing the metoprolol to carvedilol. Stop the metoprolol. Start carvedilol twice a day. We will start the carvedilol dose at 6.25 mg twice a day--we can increase this if needed.  Once the carvedilol is in your system (about a week, or when you see your BP decreasing), we will come off the hydralazine. Start by changing to only evening hydralazine for a week, and if BP is still good, then stop hydralazine entirely.  If your blood pressure goes up so that it is consistently more than 140/90, please call.   *If  you need a refill on your cardiac medications before your next appointment, please call your pharmacy*   Lab Work: None ordered today   Testing/Procedures: None ordered today   Follow-Up: At [facility], you and your health needs are our priority.  As part of our continuing mission to provide you with exceptional heart care, we have created designated Provider Care Teams.  These Care Teams include your primary Cardiologist (physician) and Advanced Practice Providers (APPs -  Physician Assistants and Nurse Practitioners) who all work together to provide you with the care you need, when you need it.  We recommend signing up for the patient portal called "MyChart".  Sign up information is provided on this After Visit Summary.  MyChart is used to connect with patients for Virtual Visits (Telemedicine).  Patients are able to view lab/test results, encounter notes, upcoming appointments, etc.  Non-urgent messages can be sent to your provider as well.   To learn more about what you can do with MyChart, go to [email or web address].    Your next appointment:   3 month(s)  The format for your next appointment:   In Person  Provider:   Buford Dresser, MD{     Wilhemina Bonito as a scribe for Buford Dresser, MD.,have documented all relevant documentation on the behalf of Buford Dresser, MD,as directed by  Buford Dresser, MD while in the presence of Buford Dresser, MD.  I, Buford Dresser, MD, have reviewed all documentation for this visit. The documentation on 07/05/21 for the exam, diagnosis, procedures, and orders are all accurate and complete.   Signed, Buford Dresser, MD [name] 07/05/2021  [facility]

## 2021-07-25 ENCOUNTER — Other Ambulatory Visit: Payer: Self-pay | Admitting: Internal Medicine

## 2021-07-25 DIAGNOSIS — H34832 Tributary (branch) retinal vein occlusion, left eye, with macular edema: Secondary | ICD-10-CM | POA: Diagnosis not present

## 2021-07-25 DIAGNOSIS — H35033 Hypertensive retinopathy, bilateral: Secondary | ICD-10-CM | POA: Diagnosis not present

## 2021-07-25 DIAGNOSIS — H43813 Vitreous degeneration, bilateral: Secondary | ICD-10-CM | POA: Diagnosis not present

## 2021-07-25 DIAGNOSIS — I10 Essential (primary) hypertension: Secondary | ICD-10-CM

## 2021-07-27 ENCOUNTER — Encounter: Payer: Self-pay | Admitting: Internal Medicine

## 2021-07-28 ENCOUNTER — Other Ambulatory Visit: Payer: Medicare HMO

## 2021-07-28 DIAGNOSIS — R7303 Prediabetes: Secondary | ICD-10-CM | POA: Diagnosis not present

## 2021-07-28 DIAGNOSIS — I10 Essential (primary) hypertension: Secondary | ICD-10-CM | POA: Diagnosis not present

## 2021-07-29 LAB — CMP14+EGFR
ALT: 14 IU/L (ref 0–32)
AST: 25 IU/L (ref 0–40)
Albumin/Globulin Ratio: 1.5 (ref 1.2–2.2)
Albumin: 4.2 g/dL (ref 3.7–4.7)
Alkaline Phosphatase: 65 IU/L (ref 44–121)
BUN/Creatinine Ratio: 14 (ref 12–28)
BUN: 12 mg/dL (ref 8–27)
Bilirubin Total: 0.5 mg/dL (ref 0.0–1.2)
CO2: 24 mmol/L (ref 20–29)
Calcium: 9.9 mg/dL (ref 8.7–10.3)
Chloride: 105 mmol/L (ref 96–106)
Creatinine, Ser: 0.85 mg/dL (ref 0.57–1.00)
Globulin, Total: 2.8 g/dL (ref 1.5–4.5)
Glucose: 86 mg/dL (ref 70–99)
Potassium: 4 mmol/L (ref 3.5–5.2)
Sodium: 142 mmol/L (ref 134–144)
Total Protein: 7 g/dL (ref 6.0–8.5)
eGFR: 73 mL/min/{1.73_m2} (ref >59)

## 2021-07-29 LAB — HEMOGLOBIN A1C
Est. average glucose Bld gHb Est-mCnc: 114 mg/dL
Hgb A1c MFr Bld: 5.6 % (ref 4.8–5.6)

## 2021-07-29 LAB — LIPID PANEL
Chol/HDL Ratio: 3.2 ratio (ref 0.0–4.4)
Cholesterol, Total: 185 mg/dL (ref 100–199)
HDL: 58 mg/dL (ref >39)
LDL Chol Calc (NIH): 117 mg/dL — ABNORMAL HIGH (ref 0–99)
Triglycerides: 52 mg/dL (ref 0–149)
VLDL Cholesterol Cal: 10 mg/dL (ref 5–40)

## 2021-07-29 LAB — INSULIN, RANDOM: INSULIN: 11.3 u[IU]/mL (ref 2.6–24.9)

## 2021-08-01 ENCOUNTER — Other Ambulatory Visit: Payer: Self-pay

## 2021-08-01 MED ORDER — RYBELSUS 3 MG PO TABS: ORAL_TABLET | ORAL | 2 refills | Status: DC

## 2021-08-08 ENCOUNTER — Telehealth: Payer: Medicare HMO

## 2021-08-08 ENCOUNTER — Ambulatory Visit (INDEPENDENT_AMBULATORY_CARE_PROVIDER_SITE_OTHER): Payer: Medicare HMO

## 2021-08-08 DIAGNOSIS — R7303 Prediabetes: Secondary | ICD-10-CM

## 2021-08-08 DIAGNOSIS — M19041 Primary osteoarthritis, right hand: Secondary | ICD-10-CM

## 2021-08-08 DIAGNOSIS — I10 Essential (primary) hypertension: Secondary | ICD-10-CM

## 2021-08-08 DIAGNOSIS — E7800 Pure hypercholesterolemia, unspecified: Secondary | ICD-10-CM

## 2021-08-08 DIAGNOSIS — M1A09X0 Idiopathic chronic gout, multiple sites, without tophus (tophi): Secondary | ICD-10-CM

## 2021-08-08 NOTE — Patient Instructions (Signed)
Visit Information ? ?Thank you for taking time to visit with me today. Please don't hesitate to contact me if I can be of assistance to you before our next scheduled telephone appointment. ? ?Following are the goals we discussed today:  ?(Copy and paste patient goals from clinical care plan here) ? ?Our next appointment is by telephone on 10/31/21 at 12 PM ? ?Please call the care guide team at [phone] if you need to cancel or reschedule your appointment.  ? ?If you are experiencing a Mental Health or [place] or need someone to talk to, please call [phone]-TALK (toll free, 24 hour hotline)  ? ?Patient verbalizes understanding of instructions and care plan provided today and agrees to view in [place]. Active MyChart status confirmed with patient.   ? ?Barb Merino, RN, BSN, CCM ?Care Management Coordinator ?[facility]  ?Direct Phone: [phone] ? ? ?

## 2021-08-08 NOTE — Chronic Care Management (AMB) (Signed)
?Chronic Care Management  ? ?CCM RN Visit Note ? ?08/08/2021 ?Name: Robin Odom MRN: [id number] DOB: Sep 23, 1950 ? ?Subjective: ?Robin Odom is a 71 y.o. year old female who is a primary care patient of Glendale Chard, MD. The care management team was consulted for assistance with disease management and care coordination needs.   ? ?Engaged with patient by telephone for follow up visit in response to provider referral for case management and/or care coordination services.  ? ?Consent to Services:  ?The patient was given information about Chronic Care Management services, agreed to services, and gave verbal consent prior to initiation of services.  Please see initial visit note for detailed documentation.  ? ?Patient agreed to services and verbal consent obtained.  ? ?Assessment: Review of patient past medical history, allergies, medications, health status, including review of consultants reports, laboratory and other test data, was performed as part of comprehensive evaluation and provision of chronic care management services.  ? ?SDOH (Social Determinants of Health) assessments and interventions performed:  Yes, no acute challenges  ? ?CCM Care Plan ? ?Allergies  ?Allergen Reactions  ? Benadryl [Diphenhydramine] Other (See Comments)  ?  Temporary Amnesia   ? Codeine Other (See Comments)  ?  Hallucinations  ? Penicillins Hives  ? ? ?Outpatient Encounter Medications as of 08/08/2021  ?Medication Sig  ? allopurinol (ZYLOPRIM) 300 MG tablet Take 1 tablet (300 mg total) by mouth daily.  ? amLODipine (NORVASC) 10 MG tablet Take 1 tablet (10 mg total) by mouth daily.  ? Ascorbic Acid (VITAMIN C PO) Take 1,500 mg by mouth daily at 6 (six) AM.  ? azelastine (ASTELIN) 0.1 % nasal spray 1-2 sprays each nostril twice daily as needed  ? B Complex Vitamins (VITAMIN-B COMPLEX) TABS Take by mouth.  ? BENFOTIAMINE PO Take by mouth 2 (two) times daily.  ? Calcium Carb-Cholecalciferol 500-10 MG-MCG TABS Take 1 tablet by mouth daily.  ?  carvedilol (COREG) 6.25 MG tablet Take 1 tablet (6.25 mg total) by mouth 2 (two) times daily.  ? Cholecalciferol (VITAMIN D3) 125 MCG (5000 UT) CAPS Take by mouth.   ? CITRUS BERGAMOT PO Take 1 tablet by mouth daily. 500 mg  ? colchicine 0.6 MG tablet Take 0.6 mg by mouth as needed.  ? Cyanocobalamin (B-12) 5000 MCG CAPS Take 1 capsule by mouth daily at 12 noon.  ? furosemide (LASIX) 20 MG tablet TAKE 1 TABLET BY MOUTH EVERY DAY  ? magnesium oxide (MAG-OX) 400 MG tablet Take 400 mg by mouth daily.  ? melatonin 3 MG TABS tablet Take 3 mg by mouth at bedtime.  ? olmesartan (BENICAR) 40 MG tablet TAKE 1 TABLET BY MOUTH EVERY DAY  ? Omega-3 Fatty Acids (OMEGA 3 PO) Take 2,500 mg by mouth daily. 2 tablets by mouth daily  ? potassium chloride (MICRO-K) 10 MEQ CR capsule TAKE 1 CAPSULE BY MOUTH EVERY DAY  ? Semaglutide (RYBELSUS) 3 MG TABS Take one tablet by mouth 33mns before breakfast with 4oz water.  ? triamcinolone cream (KENALOG) 0.1 % APPLY TO AFFECTED AREA TWICE DAILY AS NEEDED  ? TURMERIC CURCUMIN PO Take by mouth daily.  ? ?No facility-administered encounter medications on file as of 08/08/2021.  ? ? ?Patient Active Problem List  ? Diagnosis Date Noted  ? History of wheezing 03/18/2020  ? Osteopenia of multiple sites 10/30/2019  ? Prediabetes 05/14/2018  ? Essential hypertension, benign 05/14/2018  ? Class 3 severe obesity due to excess calories with serious comorbidity and body mass  index (BMI) of 40.0 to 44.9 in adult [facility]) 05/14/2018  ? 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 tophus 07/20/2016  ? Primary osteoarthritis of both hands 07/20/2016  ? Primary osteoarthritis of both knees 07/20/2016  ? Primary osteoarthritis of both feet 07/20/2016  ? DJD (degenerative joint disease), cervical 07/20/2016  ? Elevated CK 07/20/2016  ? History of humerus fracture 07/20/2016  ? History of hypertension 07/20/2016  ? History of diabetes mellitus 07/20/2016  ? History of hyperlipidemia 07/20/2016  ? Chronic  rhinitis 10/20/2015  ? Anosmia 10/20/2015  ? Mild intermittent asthma 10/20/2015  ? ? ?Conditions to be addressed/monitored: Essential HTN, Prediabetes, Pure Hypercholesterolemia, Idiopathic chronic gout, Primary Osteoarthritis ? ?Care Plan : RN Care Manager Plan of Care  ?Updates made by Lynne Logan, RN since 08/08/2021 12:00 AM  ?  ? ?Problem: Chronic disease education and Care Coordination needs for Essential HTN, Prediabetes, Pure Hypercholesterolemia, Idiopathic chronic gout, Primary Osteoarthritis   ?Priority: High  ?  ? ?Long-Range Goal: Assist with Chronic disease education and Care Coordination needs for Essential HTN, Prediabetes, Pure Hypercholesterolemia, Idiopathic chronic gout, Primary Osteoarthritis   ?Start Date: 01/31/2021  ?Expected End Date: 01/31/2022  ?Recent Progress: On track  ?Priority: High  ?Note:   ?Current Barriers:  ?Knowledge Deficits related to plan of care for management of Essential HTN, Prediabetes, Pure Hypercholesterolemia, Idiopathic chronic gout, Primary Osteoarthritis ?Chronic Disease Management support and education needs related to Essential HTN, Prediabetes, Pure Hypercholesterolemia, Idiopathic chronic gout, Primary Osteoarthritis ? ?RNCM Clinical Goal(s):  ?Patient will demonstrate Ongoing health management independence Essential HTN, Prediabetes, Pure Hypercholesterolemia, Idiopathic chronic gout, Primary Osteoarthritis ?continue to work with RN Care Manager to address care management and care coordination needs related to  HTN, DMII, and Osteoarthritis through collaboration with RN Care manager, provider, and care team.  ? ?Interventions: ?1:1 collaboration with primary care provider regarding development and update of comprehensive plan of care as evidenced by provider attestation and co-signature ?Inter-disciplinary care team collaboration (see longitudinal plan of care) ?Evaluation of current treatment plan related to  self management and patient's adherence to plan  as established by provider ? ? ?Hypertension Interventions:  (Status:  Goal on track:  Yes.) Long Term Goal ?Last practice recorded BP readings:  ?BP Readings from Last 3 Encounters:  ?07/05/21 (!) 142/88  ?06/16/21 132/80  ?05/18/21 (!) 145/87  ?Most recent eGFR/CrCl:  ?Lab Results  ?Component Value Date  ? EGFR 73 07/28/2021  ?  No components found for: CRCL ?Evaluation of current treatment plan related to hypertension self management and patient's adherence to plan as established by provider ?Reviewed medications with patient and discussed importance of compliance ?Counseled on the importance of exercise goals with target of 150 minutes per week ?Advised patient, providing education and rationale, to monitor blood pressure daily and record, calling PCP for findings outside established parameters ?Provided education on prescribed diet low Sodium ?Discussed complications of poorly controlled blood pressure such as heart disease, stroke, circulatory complications, vision complications, kidney impairment, sexual dysfunction ?Mailed printed educational materials related to What is High Blood Pressure; Understanding BP readings ? ?Osteoarthritis/Gout Interventions: Status: (Condition stable.  Not addressed this visit.) Long Term Goal  ?Evaluation of current treatment plan related to Osteoarthritis, self-management and patient's adherence to plan as established by provider ?Determined patient continues to have her Osteoarthritis and Gout managed per Rheumatology ?Reviewed and discussed treatment recommendations following recent follow up: ?Assessment / Plan:     ?Visit Diagnoses: 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 tophus -  She has not had any signs or symptoms of a gout flare.  She has clinically been doing well taking allopurinol 300 mg 1 tablet by mouth daily.  She continues to tolerate allopurinol without any side effects and has not missed any doses recently.  Her uric acid was within the desirable  range: 4.2 on 01/14/2021.  Uric acid, CBC, and CMP will be updated today.  She will remain on the current treatment regimen.  A refill of allopurinol was sent to the pharmacy today.  She was advised to notify u

## 2021-08-16 ENCOUNTER — Other Ambulatory Visit: Payer: Self-pay | Admitting: Physician Assistant

## 2021-08-16 NOTE — Telephone Encounter (Signed)
Next Visit: 11/15/2021 ? ?Last Visit: 05/18/2021 ? ?Last Fill: 05/18/2021  ? ?DX:  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 tophus ? ?Current Dose per office note 05/18/2021: allopurinol 300 mg 1 tablet by mouth daily ? ?Labs: 05/18/2021 CBC and CMP WNL.  Uric acid is within the desirable range.  ? ?Okay to refill Allopurinol?  ?

## 2021-08-18 ENCOUNTER — Telehealth: Payer: Self-pay | Admitting: Rheumatology

## 2021-08-18 NOTE — Telephone Encounter (Signed)
Patient called the office stating she had lost her handicap placard and needs a new form filled out for a replacement. Patient requests a return call when she can pick up the form.  ?

## 2021-08-18 NOTE — Telephone Encounter (Signed)
Patient advised handicap placard is complete and ready for pick up.  ?

## 2021-08-18 NOTE — Telephone Encounter (Signed)
Ok to renew handicap placard

## 2021-08-24 DIAGNOSIS — Z1883 Retained stone or crystalline fragments: Secondary | ICD-10-CM | POA: Diagnosis not present

## 2021-08-24 DIAGNOSIS — N907 Vulvar cyst: Secondary | ICD-10-CM | POA: Diagnosis not present

## 2021-08-24 DIAGNOSIS — N9089 Other specified noninflammatory disorders of vulva and perineum: Secondary | ICD-10-CM | POA: Diagnosis not present

## 2021-08-28 DIAGNOSIS — I10 Essential (primary) hypertension: Secondary | ICD-10-CM | POA: Diagnosis not present

## 2021-08-28 DIAGNOSIS — M19041 Primary osteoarthritis, right hand: Secondary | ICD-10-CM | POA: Diagnosis not present

## 2021-08-28 DIAGNOSIS — E7800 Pure hypercholesterolemia, unspecified: Secondary | ICD-10-CM | POA: Diagnosis not present

## 2021-08-28 DIAGNOSIS — M19042 Primary osteoarthritis, left hand: Secondary | ICD-10-CM | POA: Diagnosis not present

## 2021-09-10 ENCOUNTER — Other Ambulatory Visit: Payer: Self-pay | Admitting: Internal Medicine

## 2021-09-22 DIAGNOSIS — Z13820 Encounter for screening for osteoporosis: Secondary | ICD-10-CM | POA: Diagnosis not present

## 2021-09-22 DIAGNOSIS — N9089 Other specified noninflammatory disorders of vulva and perineum: Secondary | ICD-10-CM | POA: Diagnosis not present

## 2021-10-03 DIAGNOSIS — G4733 Obstructive sleep apnea (adult) (pediatric): Secondary | ICD-10-CM | POA: Diagnosis not present

## 2021-10-06 ENCOUNTER — Encounter (HOSPITAL_BASED_OUTPATIENT_CLINIC_OR_DEPARTMENT_OTHER): Payer: Self-pay | Admitting: Cardiology

## 2021-10-06 ENCOUNTER — Encounter (HOSPITAL_BASED_OUTPATIENT_CLINIC_OR_DEPARTMENT_OTHER): Payer: Self-pay | Admitting: Cardiovascular Disease

## 2021-10-06 ENCOUNTER — Ambulatory Visit (HOSPITAL_BASED_OUTPATIENT_CLINIC_OR_DEPARTMENT_OTHER): Payer: Medicare HMO | Admitting: Cardiology

## 2021-10-06 ENCOUNTER — Encounter (HOSPITAL_BASED_OUTPATIENT_CLINIC_OR_DEPARTMENT_OTHER): Payer: Self-pay

## 2021-10-06 VITALS — BP 130/78 | HR 69 | Ht 65.0 in | Wt 229.7 lb

## 2021-10-06 DIAGNOSIS — R0609 Other forms of dyspnea: Secondary | ICD-10-CM | POA: Diagnosis not present

## 2021-10-06 DIAGNOSIS — Z7189 Other specified counseling: Secondary | ICD-10-CM

## 2021-10-06 DIAGNOSIS — I10 Essential (primary) hypertension: Secondary | ICD-10-CM | POA: Diagnosis not present

## 2021-10-06 DIAGNOSIS — M25472 Effusion, left ankle: Secondary | ICD-10-CM | POA: Diagnosis not present

## 2021-10-06 DIAGNOSIS — Z6838 Body mass index (BMI) 38.0-38.9, adult: Secondary | ICD-10-CM

## 2021-10-06 DIAGNOSIS — M25471 Effusion, right ankle: Secondary | ICD-10-CM

## 2021-10-06 DIAGNOSIS — Z9189 Other specified personal risk factors, not elsewhere classified: Secondary | ICD-10-CM

## 2021-10-06 NOTE — Telephone Encounter (Signed)
Please see BP logs for appointment today

## 2021-10-06 NOTE — Progress Notes (Signed)
Cardiology Office Note:    Date:  10/06/2021   ID:  Robin Odom, DOB [birth year], MRN [id number]  PCP:  Glendale Chard, MD  Cardiologist:  Buford Dresser, MD  Referring MD: Glendale Chard, MD   CC: follow up  History of Present Illness:    Robin Odom is a 71 y.o. female with a hx of hypertension, asthma who is seen for follow up today. I initially met her 08/02/20 as a new consult at the request of Glendale Chard, MD for the evaluation and management of dyspnea on exertion.  CV risk: hypertension, hyperlipidemia, family history of heart disease. OSA on CPAP.  CV history: Treadmill stress with hypertensive response to exercise; echo also with diastolic dysfunction and mildly elevated filling pressure based on tissue doppler. LVEF normal, no significant valve disease.  At her last appointment she noted episodes of bilateral ankle edema up to 4 days which she attributed to amlodipine. At home, her blood pressure had been more elevated in the morning, and generally higher as well due to increased stress. She was infected with COVID in early October 2022. Since then she complained of a constant brain fog. Her metoprolol was switched to carvedilol and her hydralazine was stopped.  Today: Earlier today she sent a BP log via Mychart for today's appointment, personally reviewed and shows mostly 110s-120s on average. She reports that sometimes she notices different readings between her upper extremities, and has noticed higher readings correlating with joint pain. After her medication changes at her last appointment she had immediate improvement in her blood pressures.  She is generally feeling well. However, she had a few days of severe LE edema which she attributed to severe joint pain after a long flight. Today her swelling is more stable, which is typical for her. She does wear leg compression regularly with travel.  Usually she will take her Lasix in the late afternoons, and sometimes with  her carvedilol. She has noticed her urinary frequency is less severe when she takes both of these medications simultaneously.   She endorses mild shortness of breath that she is able to push through and keep walking. She has been working with a trainer regarding her walking. She was preparing for her 63 year college reunion, and had to walk/stand for long periods.  She denies any palpitations, or chest pain. No lightheadedness, headaches, syncope, orthopnea, or PND.   Past Medical History:  Diagnosis Date   Arthritis    Diabetes mellitus without complication ([place])    Gout    Hypertension    Partial retinal tear of both eyes without detachment 08/29/2017    Past Surgical History:  Procedure Laterality Date   ABDOMINAL HYSTERECTOMY  1991   CARPAL TUNNEL RELEASE Right 2019   COLONOSCOPY     5 between 1994-2010   Left knee surgery  2006   ROTATOR CUFF REPAIR Left 2001   ROTATOR CUFF REPAIR Right 2003   SHOULDER SURGERY  2001/2003   TONSILECTOMY/ADENOIDECTOMY WITH MYRINGOTOMY  1971    Current Medications: Current Outpatient Medications on File Prior to Visit  Medication Sig   allopurinol (ZYLOPRIM) 300 MG tablet TAKE 1 TABLET BY MOUTH EVERY DAY   amLODipine (NORVASC) 10 MG tablet Take 1 tablet (10 mg total) by mouth daily.   Ascorbic Acid (VITAMIN C PO) Take 1,500 mg by mouth daily at 6 (six) AM.   azelastine (ASTELIN) 0.1 % nasal spray 1-2 sprays each nostril twice daily as needed   B Complex Vitamins (VITAMIN-B  COMPLEX) TABS Take by mouth.   BENFOTIAMINE PO Take by mouth 2 (two) times daily.   Calcium Carb-Cholecalciferol 500-10 MG-MCG TABS Take 1 tablet by mouth daily.   carvedilol (COREG) 6.25 MG tablet Take 1 tablet (6.25 mg total) by mouth 2 (two) times daily.   Cholecalciferol (VITAMIN D3) 125 MCG (5000 UT) CAPS Take by mouth.    CITRUS BERGAMOT PO Take 1 tablet by mouth daily. 500 mg   colchicine 0.6 MG tablet Take 0.6 mg by mouth as needed.   Cyanocobalamin (B-12) 5000  MCG CAPS Take 1 capsule by mouth daily at 12 noon.   furosemide (LASIX) 20 MG tablet TAKE 1 TABLET BY MOUTH EVERY DAY   magnesium oxide (MAG-OX) 400 MG tablet Take 400 mg by mouth daily.   melatonin 3 MG TABS tablet Take 3 mg by mouth at bedtime.   olmesartan (BENICAR) 40 MG tablet TAKE 1 TABLET BY MOUTH EVERY DAY   Omega-3 Fatty Acids (OMEGA 3 PO) Take 2,500 mg by mouth daily. 2 tablets by mouth daily   potassium chloride (MICRO-K) 10 MEQ CR capsule TAKE 1 CAPSULE BY MOUTH EVERY DAY   Semaglutide (RYBELSUS) 3 MG TABS Take one tablet by mouth 41mns before breakfast with 4oz water.   triamcinolone cream (KENALOG) 0.1 % APPLY TO AFFECTED AREA TWICE DAILY AS NEEDED   TURMERIC CURCUMIN PO Take by mouth daily.   No current facility-administered medications on file prior to visit.     Allergies:   Benadryl [diphenhydramine], Codeine, and Penicillins   Social History   Tobacco Use   Smoking status: Never   Smokeless tobacco: Never  Vaping Use   Vaping Use: Never used  Substance Use Topics   Alcohol use: Yes    Comment: rarely   Drug use: No    Family History: family history includes Allergic rhinitis in her sister; Arthritis in her father; Diabetes in her brother, mother, and sister; Heart disease in her father and mother; High Cholesterol in her brother and sister; Hypertension in her brother, brother, father, mother, and sister; Parkinson's disease in her father; Prostate cancer in her father; Sinusitis in her sister. There is no history of Angioedema, Asthma, Eczema, Immunodeficiency, Urticaria, or Breast cancer.   3/4 of her siblings has HTN. 2/4 of her siblings has DM. Mother had Type II DM(most of maternal siblings had DM). Mother had unknown CAD and underwent a bypass surgery three years prior to death of a MI in 128 Most of her maternal sibling died from MI. Father had Type II  DM(Paternal sibling no DM). Unaware of any strokes on her fathers side, mostly cancer.    ROS:    Please see the history of present illness.   (+) Bilateral LE edema (+) Shortness of breath (+) Urinary frequency on Lasix (+) Arthralgias Additional pertinent ROS otherwise unremarkable.  EKGs/Labs/Other Studies Reviewed:    The following studies were reviewed today:  Echo 08/27/20 1. Left ventricular ejection fraction, by estimation, is 60 to 65%. The  left ventricle has normal function. The left ventricle has no regional  wall motion abnormalities. Left ventricular diastolic parameters are  consistent with Grade I diastolic  dysfunction (impaired relaxation). Elevated left atrial pressure. The E/e'  is 159   2. Right ventricular systolic function is normal. The right ventricular  size is normal.   3. Left atrial size was mildly dilated.   4. The mitral valve is normal in structure. No evidence of mitral valve  regurgitation. No evidence of  mitral stenosis.   5. The aortic valve is normal in structure. Aortic valve regurgitation is  not visualized. No aortic stenosis is present.   6. The inferior vena cava is normal in size with greater than 50%  respiratory variability, suggesting right atrial pressure of 3 mmHg.   ETT 08/18/20  Blood pressure demonstrated a hypertensive response to exercise. There was no ST segment deviation noted during stress.   Normal ETT HTN response to exercise   EKG:  EKG was personally reviewed today 10/06/2021:  not ordered today 12/30/20: NSR at 69 bpm, PRWP  07/31/20: NSR at 74 bpm  Recent Labs: 05/18/2021: Hemoglobin 13.1; Platelets 334 07/28/2021: ALT 14; BUN 12; Creatinine, Ser 0.85; Potassium 4.0; Sodium 142   Recent Lipid Panel    Component Value Date/Time   CHOL 185 07/28/2021 1106   TRIG 52 07/28/2021 1106   HDL 58 07/28/2021 1106   CHOLHDL 3.2 07/28/2021 1106   LDLCALC 117 (H) 07/28/2021 1106    Physical Exam:    VS:  BP 130/78   Pulse 69   Ht 5' 5"  (1.651 m)   Wt 229 lb 11.2 oz (104.2 kg)   SpO2 98%   BMI 38.22 kg/m      Wt Readings from Last 3 Encounters:  10/06/21 229 lb 11.2 oz (104.2 kg)  07/05/21 234 lb 3.2 oz (106.2 kg)  06/16/21 228 lb 6.4 oz (103.6 kg)    GEN: Well nourished, well developed in no acute distress HEENT: Normal, moist mucous membranes NECK: No JVD CARDIAC: regular rhythm, normal S1 and S2, no rubs or gallops. No murmur. VASCULAR: Radial and DP pulses 2+ bilaterally. No carotid bruits RESPIRATORY:  Clear to auscultation without rales, wheezing or rhonchi  ABDOMEN: Soft, non-tender, non-distended MUSCULOSKELETAL:  Ambulates independently SKIN: Warm and dry, trace bilateral ankle edema with right > left, palpable bilateral ankle bursa NEUROLOGIC:  Alert and oriented x 3. No focal neuro deficits noted. PSYCHIATRIC:  Normal affect     ASSESSMENT:    1. Essential hypertension   2. Swelling of both ankles   3. At increased risk for cardiovascular disease   4. Class 2 severe obesity due to excess calories with serious comorbidity and body mass index (BMI) of 38.0 to 38.9 in adult ([place])   5. Family history of heart disease   6. DOE (dyspnea on exertion)   7. Cardiac risk counseling   8. Counseling on health promotion and disease prevention     PLAN:    Hypertension: goal <130/80 -Both echo and treadmill stress suggest that elevated blood pressure may be causing some of her dyspnea on exertion, both diastolic dysfunction and hypertensive BP response to exercise. Otherwise, structurally normal heart, no ischemia -Her LE swelling is predominantly ankle bursa, with only trace edema -Home blood pressures reviewed, well controlled.  Current medications Amlodipine 10 mg daily (at night) Furosemide 20 mg (usually once/day, two if swollen) Carvedilol 6.25 mg BID Olmesartan 40 mg daily (in the morning) Never been on spironolactone. Consider if additional med needed  -discussed exercise, sodium, how lifestyle can also affect BP  Hyperlipidemia: Elevated ASCVD risk score -per [facility],  Tchol 188, HDL 54, LDL 122, TG 63 07/08/20 -we discussed her elevated ASCVD risk and recommendations for statins. She declines.  Class 2 obesity: working on weight loss, BMI 38 today  Cardiac risk counseling and prevention recommendations: -recommend heart healthy/Mediterranean diet, with whole grains, fruits, vegetable, fish, lean meats, nuts, and olive oil. Limit salt. -recommend moderate walking, 3-5  times/week for 30-50 minutes each session. Aim for at least 150 minutes.week. Goal should be pace of 3 miles/hours, or walking 1.5 miles in 30 minutes -recommend avoidance of tobacco products. Avoid excess alcohol. -ASCVD risk score: The 10-year ASCVD risk score (Arnett DK, et al., 2019) is: 46.3%   Values used to calculate the score:     Age: 49 years     Sex: Female     Is Non-Hispanic African American: Yes     Diabetic: Yes     Tobacco smoker: Yes     Systolic Blood Pressure: 767 mmHg     Is BP treated: Yes     HDL Cholesterol: 58 mg/dL     Total Cholesterol: 185 mg/dL    Plan for follow up: 6 months or sooner as needed  Buford Dresser, MD, [name], [facility] [address] [place], [place] [address] [phone]   Medication Adjustments/Labs and Tests Ordered: Current medicines are reviewed at length with the patient today.  Concerns regarding medicines are outlined above.   No orders of the defined types were placed in this encounter.  No orders of the defined types were placed in this encounter.  Patient Instructions  Medication Instructions:  Your Physician recommend you continue on your current medication as directed.    *If you need a refill on your cardiac medications before your next appointment, please call your pharmacy*   Lab Work: None ordered today   Testing/Procedures: None ordered today   Follow-Up: At [facility], you and  your health needs are our priority.  As part of our continuing mission to provide you with exceptional heart care, we have created designated Provider Care Teams.  These Care Teams include your primary Cardiologist (physician) and Advanced Practice Providers (APPs -  Physician Assistants and Nurse Practitioners) who all work together to provide you with the care you need, when you need it.  We recommend signing up for the patient portal called "MyChart".  Sign up information is provided on this After Visit Summary.  MyChart is used to connect with patients for Virtual Visits (Telemedicine).  Patients are able to view lab/test results, encounter notes, upcoming appointments, etc.  Non-urgent messages can be sent to your provider as well.   To learn more about what you can do with MyChart, go to [email or web address].    Your next appointment:   6 month(s)  The format for your next appointment:   In Person  Provider:   Buford Dresser, MD{   Look into [facility] [facility] for fitted compression stockings: [address], [place], [place] [address] [phone] [email or web address]    I,Mathew Stumpf,acting as a Education administrator for [employer], MD.,have documented all relevant documentation on the behalf of Buford Dresser, MD,as directed by  Buford Dresser, MD while in the presence of Buford Dresser, MD.  I, Buford Dresser, MD, have reviewed all documentation for this visit. The documentation on 10/06/21 for the exam, diagnosis, procedures, and orders are all accurate and complete.   Signed, Buford Dresser, MD [name] 10/06/2021  [place]

## 2021-10-06 NOTE — Patient Instructions (Addendum)
Medication Instructions:  Your Physician recommend you continue on your current medication as directed.    *If you need a refill on your cardiac medications before your next appointment, please call your pharmacy*   Lab Work: None ordered today   Testing/Procedures: None ordered today   Follow-Up: At [facility], you and your health needs are our priority.  As part of our continuing mission to provide you with exceptional heart care, we have created designated Provider Care Teams.  These Care Teams include your primary Cardiologist (physician) and Advanced Practice Providers (APPs -  Physician Assistants and Nurse Practitioners) who all work together to provide you with the care you need, when you need it.  We recommend signing up for the patient portal called "MyChart".  Sign up information is provided on this After Visit Summary.  MyChart is used to connect with patients for Virtual Visits (Telemedicine).  Patients are able to view lab/test results, encounter notes, upcoming appointments, etc.  Non-urgent messages can be sent to your provider as well.   To learn more about what you can do with MyChart, go to [email or web address].    Your next appointment:   6 month(s)  The format for your next appointment:   In Person  Provider:   Buford Dresser, MD{   Look into [facility] [facility] for fitted compression stockings: [address] [place], [place], [place] [address] [phone] [email or web address]

## 2021-10-19 ENCOUNTER — Other Ambulatory Visit: Payer: Self-pay | Admitting: Internal Medicine

## 2021-10-25 ENCOUNTER — Encounter: Payer: Self-pay | Admitting: Internal Medicine

## 2021-10-27 ENCOUNTER — Encounter: Payer: Self-pay | Admitting: Internal Medicine

## 2021-10-27 ENCOUNTER — Ambulatory Visit (INDEPENDENT_AMBULATORY_CARE_PROVIDER_SITE_OTHER): Payer: Medicare HMO | Admitting: Internal Medicine

## 2021-10-27 VITALS — BP 120/78 | HR 63 | Temp 97.7°F | Ht 64.6 in | Wt 227.2 lb

## 2021-10-27 DIAGNOSIS — Z6838 Body mass index (BMI) 38.0-38.9, adult: Secondary | ICD-10-CM | POA: Diagnosis not present

## 2021-10-27 DIAGNOSIS — I10 Essential (primary) hypertension: Secondary | ICD-10-CM

## 2021-10-27 DIAGNOSIS — R7303 Prediabetes: Secondary | ICD-10-CM | POA: Diagnosis not present

## 2021-10-27 NOTE — Progress Notes (Signed)
Rich Brave [place],acting as a Education administrator for Maximino Greenland, MD.,have documented all relevant documentation on the behalf of Maximino Greenland, MD,as directed by  Maximino Greenland, MD while in the presence of Maximino Greenland, MD.  This visit occurred during the SARS-CoV-2 public health emergency.  Safety protocols were in place, including screening questions prior to the visit, additional usage of staff PPE, and extensive cleaning of exam room while observing appropriate contact time as indicated for disinfecting solutions.  Subjective:     Patient ID: Robin Odom , female    DOB: May 07, 1950 , 71 y.o.   MRN: [id number]   Chief Complaint  Patient presents with   Hypertension   Prediabetes    HPI  She is here today for a prediabetes/HTN check. Patient reports compliance with her medications. She denies headaches, chest pain and shortness of breath.   Hypertension This is a chronic problem. The current episode started yesterday. The problem has been gradually improving since onset. The problem is controlled. Pertinent negatives include no blurred vision, chest pain, palpitations or shortness of breath. Risk factors for coronary artery disease include obesity and post-menopausal state. Past treatments include direct vasodilators, angiotensin blockers and diuretics.     Past Medical History:  Diagnosis Date   Arthritis    Diabetes mellitus without complication ([place])    Gout    Hypertension    Partial retinal tear of both eyes without detachment 08/29/2017     Family History  Problem Relation Age of Onset   Heart disease Mother    Hypertension Mother    Diabetes Mother    Arthritis Father    Hypertension Father    Prostate cancer Father    Parkinson's disease Father    Heart disease Father    Sinusitis Sister    Allergic rhinitis Sister    Diabetes Sister    High Cholesterol Sister    Hypertension Sister    Diabetes Brother    Hypertension Brother    High Cholesterol Brother     Hypertension Brother    Angioedema Neg Hx    Asthma Neg Hx    Eczema Neg Hx    Immunodeficiency Neg Hx    Urticaria Neg Hx    Breast cancer Neg Hx      Current Outpatient Medications:    amLODipine (NORVASC) 10 MG tablet, TAKE 1 TABLET BY MOUTH EVERY DAY, Disp: 90 tablet, Rfl: 1   Ascorbic Acid (VITAMIN C PO), Take 1,500 mg by mouth daily at 6 (six) AM., Disp: , Rfl:    azelastine (ASTELIN) 0.1 % nasal spray, 1-2 sprays each nostril twice daily as needed, Disp: 30 mL, Rfl: 5   B Complex Vitamins (VITAMIN-B COMPLEX) TABS, Take by mouth., Disp: , Rfl:    BENFOTIAMINE PO, Take by mouth 2 (two) times daily., Disp: , Rfl:    Calcium Carb-Cholecalciferol 500-10 MG-MCG TABS, Take 1 tablet by mouth daily., Disp: , Rfl:    carvedilol (COREG) 6.25 MG tablet, Take 1 tablet (6.25 mg total) by mouth 2 (two) times daily., Disp: 180 tablet, Rfl: 3   Cholecalciferol (VITAMIN D3) 125 MCG (5000 UT) CAPS, Take by mouth. , Disp: , Rfl:    colchicine 0.6 MG tablet, Take 0.6 mg by mouth as needed., Disp: , Rfl:    Cyanocobalamin (B-12) 5000 MCG CAPS, Take 1 capsule by mouth daily at 12 noon., Disp: , Rfl:    furosemide (LASIX) 20 MG tablet, TAKE 1 TABLET BY MOUTH EVERY DAY,  Disp: 90 tablet, Rfl: 1   magnesium oxide (MAG-OX) 400 MG tablet, Take 400 mg by mouth daily., Disp: , Rfl:    melatonin 3 MG TABS tablet, Take 3 mg by mouth at bedtime., Disp: , Rfl:    olmesartan (BENICAR) 40 MG tablet, TAKE 1 TABLET BY MOUTH EVERY DAY, Disp: 90 tablet, Rfl: 2   Omega-3 Fatty Acids (OMEGA 3 PO), Take 2,500 mg by mouth daily. 2 tablets by mouth daily, Disp: , Rfl:    potassium chloride (MICRO-K) 10 MEQ CR capsule, TAKE 1 CAPSULE BY MOUTH EVERY DAY, Disp: 90 capsule, Rfl: 1   Semaglutide (RYBELSUS) 3 MG TABS, Take one tablet by mouth 43mns before breakfast with 4oz water. (Patient taking differently: Take 3 mg by mouth 3 (three) times a week. Take one tablet by mouth 37ms before breakfast with 4oz water.), Disp: 100  tablet, Rfl: 2   triamcinolone cream (KENALOG) 0.1 %, APPLY TO AFFECTED AREA TWICE DAILY AS NEEDED, Disp: 45 g, Rfl: 0   TURMERIC CURCUMIN PO, Take by mouth daily., Disp: , Rfl:    allopurinol (ZYLOPRIM) 300 MG tablet, TAKE 1 TABLET BY MOUTH EVERY DAY, Disp: 90 tablet, Rfl: 0   Allergies  Allergen Reactions   Benadryl [Diphenhydramine] Other (See Comments)    Temporary Amnesia    Codeine Other (See Comments)    Hallucinations   Penicillins Hives     Review of Systems  Constitutional: Negative.   Eyes:  Negative for blurred vision.  Respiratory: Negative.  Negative for shortness of breath.   Cardiovascular: Negative.  Negative for chest pain and palpitations.  Gastrointestinal: Negative.   Endocrine: Negative for polydipsia, polyphagia and polyuria.  Neurological: Negative.   Psychiatric/Behavioral: Negative.       Today's Vitals   10/27/21 1013  BP: 120/78  Pulse: 63  Temp: 97.7 F (36.5 C)  Weight: 227 lb 3.2 oz (103.1 kg)  Height: 5' 4.6" (1.641 m)  PainSc: 0-No pain   Body mass index is 38.28 kg/m.  Wt Readings from Last 3 Encounters:  11/15/21 228 lb (103.4 kg)  10/27/21 227 lb 3.2 oz (103.1 kg)  10/06/21 229 lb 11.2 oz (104.2 kg)    Objective:  Physical Exam Vitals and nursing note reviewed.  Constitutional:      Appearance: Normal appearance.  HENT:     Head: Normocephalic and atraumatic.  Eyes:     Extraocular Movements: Extraocular movements intact.  Cardiovascular:     Rate and Rhythm: Normal rate and regular rhythm.     Heart sounds: Normal heart sounds.  Pulmonary:     Effort: Pulmonary effort is normal.     Breath sounds: Normal breath sounds.  Musculoskeletal:     Cervical back: Normal range of motion.  Skin:    General: Skin is warm.  Neurological:     General: No focal deficit present.     Mental Status: She is alert.  Psychiatric:        Mood and Affect: Mood normal.        Behavior: Behavior normal.       Assessment And Plan:      1. Essential hypertension, benign Comments: Chronic, well controlled. She will c/w amlodipine 10, carvedilol 6.25 bid, olmesartan 40 and lasix 20 daily. She is encouraged to follow low sodium diet.  - BMP8+EGFR  2. Prediabetes Comments: Chronic, I will check labs as below. She is encouraged to limit her intake of refined carbs and sweetened beverages, including diet drinks. F/u in  4 months.  - Hemoglobin A1c - BMP8+EGFR  3. Class 2 severe obesity due to excess calories with serious comorbidity and body mass index (BMI) of 38.0 to 38.9 in adult [facility])  She was congratulated on her 7lb weight loss since March 2023. She is encouraged to aim for at least 150 minutes of exercise per week.    Patient was given opportunity to ask questions. Patient verbalized understanding of the plan and was able to repeat key elements of the plan. All questions were answered to their satisfaction.   I, Maximino Greenland, MD, have reviewed all documentation for this visit. The documentation on 10/27/21 for the exam, diagnosis, procedures, and orders are all accurate and complete.   IF YOU HAVE BEEN REFERRED TO A SPECIALIST, IT MAY TAKE 1-2 WEEKS TO SCHEDULE/PROCESS THE REFERRAL. IF YOU HAVE NOT HEARD FROM US/SPECIALIST IN TWO WEEKS, PLEASE GIVE [place] A CALL AT [phone].   THE PATIENT IS ENCOURAGED TO PRACTICE SOCIAL DISTANCING DUE TO THE COVID-19 PANDEMIC.

## 2021-10-27 NOTE — Patient Instructions (Signed)
Hypertension, Adult ?Hypertension is another name for high blood pressure. High blood pressure forces your heart to work harder to pump blood. This can cause problems over time. ?There are two numbers in a blood pressure reading. There is a top number (systolic) over a bottom number (diastolic). It is best to have a blood pressure that is below 120/80. ?What are the causes? ?The cause of this condition is not known. Some other conditions can lead to high blood pressure. ?What increases the risk? ?Some lifestyle factors can make you more likely to develop high blood pressure: ?Smoking. ?Not getting enough exercise or physical activity. ?Being overweight. ?Having too much fat, sugar, calories, or salt (sodium) in your diet. ?Drinking too much alcohol. ?Other risk factors include: ?Having any of these conditions: ?Heart disease. ?Diabetes. ?High cholesterol. ?Kidney disease. ?Obstructive sleep apnea. ?Having a family history of high blood pressure and high cholesterol. ?Age. The risk increases with age. ?Stress. ?What are the signs or symptoms? ?High blood pressure may not cause symptoms. Very high blood pressure (hypertensive crisis) may cause: ?Headache. ?Fast or uneven heartbeats (palpitations). ?Shortness of breath. ?Nosebleed. ?Vomiting or feeling like you may vomit (nauseous). ?Changes in how you see. ?Very bad chest pain. ?Feeling dizzy. ?Seizures. ?How is this treated? ?This condition is treated by making healthy lifestyle changes, such as: ?Eating healthy foods. ?Exercising more. ?Drinking less alcohol. ?Your doctor may prescribe medicine if lifestyle changes do not help enough and if: ?Your top number is above 130. ?Your bottom number is above 80. ?Your personal target blood pressure may vary. ?Follow these instructions at home: ?Eating and drinking ? ?If told, follow the DASH eating plan. To follow this plan: ?Fill one half of your plate at each meal with fruits and vegetables. ?Fill one fourth of your plate  at each meal with whole grains. Whole grains include whole-wheat pasta, brown rice, and whole-grain bread. ?Eat or drink low-fat dairy products, such as skim milk or low-fat yogurt. ?Fill one fourth of your plate at each meal with low-fat (lean) proteins. Low-fat proteins include fish, chicken without skin, eggs, beans, and tofu. ?Avoid fatty meat, cured and processed meat, or chicken with skin. ?Avoid pre-made or processed food. ?Limit the amount of salt in your diet to less than 1,500 mg each day. ?Do not drink alcohol if: ?Your doctor tells you not to drink. ?You are pregnant, may be pregnant, or are planning to become pregnant. ?If you drink alcohol: ?Limit how much you have to: ?0-1 drink a day for women. ?0-2 drinks a day for men. ?Know how much alcohol is in your drink. In the [place] one drink equals one 12 oz bottle of beer (355 mL), one 5 oz glass of wine (148 mL), or one 1? oz glass of hard liquor (44 mL). ?Lifestyle ? ?Work with your doctor to stay at a healthy weight or to lose weight. Ask your doctor what the best weight is for you. ?Get at least 30 minutes of exercise that causes your heart to beat faster (aerobic exercise) most days of the week. This may include walking, swimming, or biking. ?Get at least 30 minutes of exercise that strengthens your muscles (resistance exercise) at least 3 days a week. This may include lifting weights or doing Pilates. ?Do not smoke or use any products that contain nicotine or tobacco. If you need help quitting, ask your doctor. ?Check your blood pressure at home as told by your doctor. ?Keep all follow-up visits. ?Medicines ?Take over-the-counter and prescription medicines   only as told by your doctor. Follow directions carefully. ?Do not skip doses of blood pressure medicine. The medicine does not work as well if you skip doses. Skipping doses also puts you at risk for problems. ?Ask your doctor about side effects or reactions to medicines that you should watch  for. ?Contact a doctor if: ?You think you are having a reaction to the medicine you are taking. ?You have headaches that keep coming back. ?You feel dizzy. ?You have swelling in your ankles. ?You have trouble with your vision. ?Get help right away if: ?You get a very bad headache. ?You start to feel mixed up (confused). ?You feel weak or numb. ?You feel faint. ?You have very bad pain in your: ?Chest. ?Belly (abdomen). ?You vomit more than once. ?You have trouble breathing. ?These symptoms may be an emergency. Get help right away. Call 911. ?Do not wait to see if the symptoms will go away. ?Do not drive yourself to the hospital. ?Summary ?Hypertension is another name for high blood pressure. ?High blood pressure forces your heart to work harder to pump blood. ?For most people, a normal blood pressure is less than 120/80. ?Making healthy choices can help lower blood pressure. If your blood pressure does not get lower with healthy choices, you may need to take medicine. ?This information is not intended to replace advice given to you by your health care provider. Make sure you discuss any questions you have with your health care provider. ?Document Revised: 02/03/2021 Document Reviewed: 02/03/2021 ?Elsevier Patient Education ? 2023 [employer] ? ?

## 2021-10-28 ENCOUNTER — Other Ambulatory Visit: Payer: Self-pay | Admitting: Rheumatology

## 2021-10-28 LAB — BMP8+EGFR
BUN/Creatinine Ratio: 21 (ref 12–28)
BUN: 15 mg/dL (ref 8–27)
CO2: 24 mmol/L (ref 20–29)
Calcium: 9.8 mg/dL (ref 8.7–10.3)
Chloride: 103 mmol/L (ref 96–106)
Creatinine, Ser: 0.72 mg/dL (ref 0.57–1.00)
Glucose: 90 mg/dL (ref 70–99)
Potassium: 4 mmol/L (ref 3.5–5.2)
Sodium: 143 mmol/L (ref 134–144)
eGFR: 89 mL/min/{1.73_m2} (ref >59)

## 2021-10-28 LAB — HEMOGLOBIN A1C
Est. average glucose Bld gHb Est-mCnc: 117 mg/dL
Hgb A1c MFr Bld: 5.7 % — ABNORMAL HIGH (ref 4.8–5.6)

## 2021-10-28 NOTE — Telephone Encounter (Signed)
Next Visit: 11/15/2021   Last Visit: 05/18/2021   Last Fill: 08/16/2021    DX:  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 tophus   Current Dose per office note 05/18/2021: allopurinol 300 mg 1 tablet by mouth daily   Labs: 10/27/2021 BMP WNL, 05/18/2021 Uric Acid 3.9  Okay to refill Allopurinol?

## 2021-10-31 ENCOUNTER — Encounter (HOSPITAL_BASED_OUTPATIENT_CLINIC_OR_DEPARTMENT_OTHER): Payer: Self-pay | Admitting: Cardiology

## 2021-10-31 ENCOUNTER — Telehealth: Payer: Medicare HMO

## 2021-11-02 NOTE — Progress Notes (Signed)
Office Visit Note  Patient: Robin Odom             Date of Birth: November 22, 1950           MRN: [id number]             PCP: Glendale Chard, MD Referring: Glendale Chard, MD Visit Date: 11/15/2021 Occupation: '@GUAROCC'$ @  Subjective:  Medication management  History of Present Illness: Robin Odom is a 71 y.o. female with history of osteoarthritis and gouty arthropathy.  She has not had any gout flares since the last visit.She had  She states with increased barometric pressure she was having increased joint discomfort.  The symptoms have eased off to some extent.  She continues to have some pain and stiffness in her bilateral hands and her bilateral feet.  She has not noticed any joint swelling.  She notices some discomfort in her bilateral hips and her ankles.  Activities of Daily Living:  Patient reports morning stiffness for 2 minutes.   Patient Denies nocturnal pain.  Difficulty dressing/grooming: Denies Difficulty climbing stairs: Denies Difficulty getting out of chair: Denies Difficulty using hands for taps, buttons, cutlery, and/or writing: Denies  Review of Systems  Constitutional:  Negative for fatigue.  HENT:  Negative for mouth sores and mouth dryness.   Eyes:  Negative for dryness.  Respiratory:  Negative for shortness of breath.   Cardiovascular:  Negative for chest pain and palpitations.  Gastrointestinal:  Negative for constipation and diarrhea.  Endocrine: Negative for increased urination.  Genitourinary:  Negative for involuntary urination.  Musculoskeletal:  Positive for joint pain, joint pain and morning stiffness. Negative for joint swelling, myalgias and myalgias.  Skin:  Negative for color change, rash and sensitivity to sunlight.  Allergic/Immunologic: Negative for susceptible to infections.  Neurological:  Negative for dizziness and weakness.  Hematological:  Negative for swollen glands.  Psychiatric/Behavioral:  Positive for sleep disturbance. Negative for  depressed mood. The patient is not nervous/anxious.     PMFS History:  Patient Active Problem List   Diagnosis Date Noted   History of wheezing 03/18/2020   Osteopenia of multiple sites 10/30/2019   Prediabetes 05/14/2018   Essential hypertension, benign 05/14/2018   Class 3 severe obesity due to excess calories with serious comorbidity and body mass index (BMI) of 40.0 to 44.9 in adult ([place]) 05/14/2018   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 tophus 07/20/2016   Primary osteoarthritis of both hands 07/20/2016   Primary osteoarthritis of both knees 07/20/2016   Primary osteoarthritis of both feet 07/20/2016   DJD (degenerative joint disease), cervical 07/20/2016   Elevated CK 07/20/2016   History of humerus fracture 07/20/2016   History of hypertension 07/20/2016   History of diabetes mellitus 07/20/2016   History of hyperlipidemia 07/20/2016   Chronic rhinitis 10/20/2015   Anosmia 10/20/2015   Mild intermittent asthma 10/20/2015    Past Medical History:  Diagnosis Date   Arthritis    Diabetes mellitus without complication (HCC)    Gout    Hypertension    Partial retinal tear of both eyes without detachment 08/29/2017    Family History  Problem Relation Age of Onset   Heart disease Mother    Hypertension Mother    Diabetes Mother    Arthritis Father    Hypertension Father    Prostate cancer Father    Parkinson's disease Father    Heart disease Father    Sinusitis Sister    Allergic rhinitis Sister  Diabetes Sister    High Cholesterol Sister    Hypertension Sister    Diabetes Brother    Hypertension Brother    High Cholesterol Brother    Hypertension Brother    Angioedema Neg Hx    Asthma Neg Hx    Eczema Neg Hx    Immunodeficiency Neg Hx    Urticaria Neg Hx    Breast cancer Neg Hx    Past Surgical History:  Procedure Laterality Date   ABDOMINAL HYSTERECTOMY  1991   CARPAL TUNNEL RELEASE Right 2019   COLONOSCOPY     5 between 1994-2010    Left knee surgery  2006   ROTATOR CUFF REPAIR Left 2001   ROTATOR CUFF REPAIR Right 2003   SHOULDER SURGERY  2001/2003   TONSILECTOMY/ADENOIDECTOMY WITH MYRINGOTOMY  1971   Social History   Social History Narrative   Not on file   Immunization History  Administered Date(s) Administered   Influenza, High Dose Seasonal PF 02/05/2017, 02/12/2018, 12/16/2018, 01/13/2020, 01/18/2021   Influenza,inj,quad, With Preservative 02/13/2017, 02/01/2018   Influenza-Unspecified 02/03/2015, 01/18/2018, 01/29/2018, 12/16/2018   PFIZER(Purple Top)SARS-COV-2 Vaccination 01/01/2019, 01/23/2019, 08/19/2019, 08/13/2020   Pfizer Covid-19 Vaccine Bivalent Booster 27yr & up 04/03/2021   Pneumococcal Conjugate-13 01/20/2014   Pneumococcal Polysaccharide-23 10/09/2016   Tdap 12/13/2020   Unspecified SARS-COV-2 Vaccination 08/20/2019   Zoster Recombinat (Shingrix) 10/18/2019, 03/05/2020     Objective: Vital Signs: BP 124/83 (BP Location: Right Arm, Patient Position: Sitting, Cuff Size: Large)   Pulse 66   Ht '5\' 5"'$  (1.651 m)   Wt 228 lb (103.4 kg)   BMI 37.94 kg/m    Physical Exam Vitals and nursing note reviewed.  Constitutional:      Appearance: She is well-developed.  HENT:     Head: Normocephalic and atraumatic.  Eyes:     Conjunctiva/sclera: Conjunctivae normal.  Cardiovascular:     Rate and Rhythm: Normal rate and regular rhythm.     Heart sounds: Normal heart sounds.  Pulmonary:     Effort: Pulmonary effort is normal.     Breath sounds: Normal breath sounds.  Abdominal:     General: Bowel sounds are normal.     Palpations: Abdomen is soft.  Musculoskeletal:     Cervical back: Normal range of motion.  Lymphadenopathy:     Cervical: No cervical adenopathy.  Skin:    General: Skin is warm and dry.     Capillary Refill: Capillary refill takes less than 2 seconds.  Neurological:     Mental Status: She is alert and oriented to person, place, and time.  Psychiatric:        Behavior:  Behavior normal.      Musculoskeletal Exam: C-spine was in good range of motion.  Shoulder joints, elbow joints, wrist joints were in good range of motion.  There was no synovitis over MCPs PIPs or DIPs.  Bilateral PIP and DIP thickening was noted.  Hip joints and knee joints with good range of motion.  There was no tenderness over ankles or MTPs.  CDAI Exam: CDAI Score: -- Patient Global: --; Provider Global: -- Swollen: --; Tender: -- Joint Exam 11/15/2021   No joint exam has been documented for this visit   There is currently no information documented on the homunculus. Go to the Rheumatology activity and complete the homunculus joint exam.  Investigation: No additional findings.  Imaging: No results found.  Recent Labs: Lab Results  Component Value Date   WBC 9.1 05/18/2021   HGB 13.1  05/18/2021   PLT 334 05/18/2021   NA 143 10/27/2021   K 4.0 10/27/2021   CL 103 10/27/2021   CO2 24 10/27/2021   GLUCOSE 90 10/27/2021   BUN 15 10/27/2021   CREATININE 0.72 10/27/2021   BILITOT 0.5 07/28/2021   ALKPHOS 65 07/28/2021   AST 25 07/28/2021   ALT 14 07/28/2021   PROT 7.0 07/28/2021   ALBUMIN 4.2 07/28/2021   CALCIUM 9.8 10/27/2021   GFRAA 77 05/13/2020    Speciality Comments: No specialty comments available.  Procedures:  No procedures performed Allergies: Benadryl [diphenhydramine], Codeine, and Penicillins   Assessment / Plan:     Visit Diagnoses: 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 tophus -she has not had any gout flares since the last visit.  She has been taking allopurinol 300 mg 1 tablet by mouth daily. uric acid: 05/18/2021 3.9.  She denies any history of joint swelling.  She states she continues to have some stiffness in her joints due to underlying osteoarthritis.  I will repeat uric acid level today.  Medication monitoring encounter-I will check CBC with differential and CMP with GFR today.  Primary osteoarthritis of both hands-she has  bilateral PIP and DIP thickening consistent with osteoarthritis.  No synovitis was noted.  Primary osteoarthritis of both knees-she has off-and-on discomfort in her knee joints.  She states she had increased pain with the weather change which is improved now.  Primary osteoarthritis of both feet-she has bilateral PIP and DIP thickening.  She also has pedal edema.  Use of compression socks and proper fitting shoes were advised.  DDD (degenerative disc disease), cervical-she had good range of motion without discomfort.  Osteopenia of multiple sites - 10/29/2019 bone density report from [facility] showed T score of -2.1 in the left femoral neck consistent with osteopenia.  Use of calcium rich diet and vitamin D was discussed.  Other medical problems listed as follows:  History of diabetes mellitus  History of hyperlipidemia  History of hypertension  History of humerus fracture  Orders: Orders Placed This Encounter  Procedures   CBC with Differential/Platelet   COMPLETE METABOLIC PANEL WITH GFR   Uric acid   No orders of the defined types were placed in this encounter.    Follow-Up Instructions: Return in about 6 months (around 05/18/2022) for Osteoarthritis, Gout.   Bo Merino, MD  Note - This record has been created using Editor, commissioning.  Chart creation errors have been sought, but may not always  have been located. Such creation errors do not reflect on  the standard of medical care.

## 2021-11-08 ENCOUNTER — Telehealth: Payer: Medicare HMO

## 2021-11-08 ENCOUNTER — Ambulatory Visit (INDEPENDENT_AMBULATORY_CARE_PROVIDER_SITE_OTHER): Payer: Medicare HMO

## 2021-11-08 DIAGNOSIS — E7800 Pure hypercholesterolemia, unspecified: Secondary | ICD-10-CM

## 2021-11-08 DIAGNOSIS — M19041 Primary osteoarthritis, right hand: Secondary | ICD-10-CM

## 2021-11-08 DIAGNOSIS — M1A09X0 Idiopathic chronic gout, multiple sites, without tophus (tophi): Secondary | ICD-10-CM

## 2021-11-08 DIAGNOSIS — I10 Essential (primary) hypertension: Secondary | ICD-10-CM

## 2021-11-08 DIAGNOSIS — R7303 Prediabetes: Secondary | ICD-10-CM

## 2021-11-08 NOTE — Chronic Care Management (AMB) (Signed)
Chronic Care Management   CCM RN Visit Note  11/08/2021 Name: Robin Odom MRN: [id number] DOB: February 18, 1951  Subjective: Robin Odom is a 71 y.o. year old female who is a primary care patient of Robin Chard, MD. The care management team was consulted for assistance with disease management and care coordination needs.    Engaged with patient by telephone for follow up visit in response to provider referral for case management and/or care coordination services.   Consent to Services:  The patient was given information about Chronic Care Management services, agreed to services, and gave verbal consent prior to initiation of services.  Please see initial visit note for detailed documentation.   Patient agreed to services and verbal consent obtained.   Assessment: Review of patient past medical history, allergies, medications, health status, including review of consultants reports, laboratory and other test data, was performed as part of comprehensive evaluation and provision of chronic care management services.   SDOH (Social Determinants of Health) assessments and interventions performed:  Yes, no acute needs   CCM Care Plan  Allergies  Allergen Reactions   Benadryl [Diphenhydramine] Other (See Comments)    Temporary Amnesia    Codeine Other (See Comments)    Hallucinations   Penicillins Hives    Outpatient Encounter Medications as of 11/08/2021  Medication Sig   allopurinol (ZYLOPRIM) 300 MG tablet TAKE 1 TABLET BY MOUTH EVERY DAY   amLODipine (NORVASC) 10 MG tablet TAKE 1 TABLET BY MOUTH EVERY DAY   Ascorbic Acid (VITAMIN C PO) Take 1,500 mg by mouth daily at 6 (six) AM.   azelastine (ASTELIN) 0.1 % nasal spray 1-2 sprays each nostril twice daily as needed   B Complex Vitamins (VITAMIN-B COMPLEX) TABS Take by mouth.   BENFOTIAMINE PO Take by mouth 2 (two) times daily.   Calcium Carb-Cholecalciferol 500-10 MG-MCG TABS Take 1 tablet by mouth daily.   carvedilol (COREG) 6.25 MG  tablet Take 1 tablet (6.25 mg total) by mouth 2 (two) times daily.   Cholecalciferol (VITAMIN D3) 125 MCG (5000 UT) CAPS Take by mouth.    colchicine 0.6 MG tablet Take 0.6 mg by mouth as needed.   Cyanocobalamin (B-12) 5000 MCG CAPS Take 1 capsule by mouth daily at 12 noon.   furosemide (LASIX) 20 MG tablet TAKE 1 TABLET BY MOUTH EVERY DAY   magnesium oxide (MAG-OX) 400 MG tablet Take 400 mg by mouth daily.   melatonin 3 MG TABS tablet Take 3 mg by mouth at bedtime.   olmesartan (BENICAR) 40 MG tablet TAKE 1 TABLET BY MOUTH EVERY DAY   Omega-3 Fatty Acids (OMEGA 3 PO) Take 2,500 mg by mouth daily. 2 tablets by mouth daily   potassium chloride (MICRO-K) 10 MEQ CR capsule TAKE 1 CAPSULE BY MOUTH EVERY DAY   Semaglutide (RYBELSUS) 3 MG TABS Take one tablet by mouth 27mns before breakfast with 4oz water.   triamcinolone cream (KENALOG) 0.1 % APPLY TO AFFECTED AREA TWICE DAILY AS NEEDED   TURMERIC CURCUMIN PO Take by mouth daily.   No facility-administered encounter medications on file as of 11/08/2021.    Patient Active Problem List   Diagnosis Date Noted   History of wheezing 03/18/2020   Osteopenia of multiple sites 10/30/2019   Prediabetes 05/14/2018   Essential hypertension, benign 05/14/2018   Class 3 severe obesity due to excess calories with serious comorbidity and body mass index (BMI) of 40.0 to 44.9 in adult ([facility] 05/14/2018   Idiopathic chronic gout of multiple  sites without tophus 07/20/2016   Primary osteoarthritis of both hands 07/20/2016   Primary osteoarthritis of both knees 07/20/2016   Primary osteoarthritis of both feet 07/20/2016   DJD (degenerative joint disease), cervical 07/20/2016   Elevated CK 07/20/2016   History of humerus fracture 07/20/2016   History of hypertension 07/20/2016   History of diabetes mellitus 07/20/2016   History of hyperlipidemia 07/20/2016   Chronic rhinitis 10/20/2015   Anosmia 10/20/2015   Mild intermittent asthma 10/20/2015     Conditions to be addressed/monitored: Essential HTN, Prediabetes, Pure Hypercholesterolemia, Idiopathic chronic gout, Primary Osteoarthritis  Care Plan : RN Care Manager Plan of Care  Updates made by Lynne Logan, RN since 11/08/2021 12:00 AM  Completed 11/08/2021   Problem: Chronic disease education and Care Coordination needs for Essential HTN, Prediabetes, Pure Hypercholesterolemia, Idiopathic chronic gout, Primary Osteoarthritis Resolved 11/08/2021  Priority: High     Long-Range Goal: Assist with Chronic disease education and Care Coordination needs for Essential HTN, Prediabetes, Pure Hypercholesterolemia, Idiopathic chronic gout, Primary Osteoarthritis Completed 11/08/2021  Start Date: 01/31/2021  Expected End Date: 01/31/2022  Recent Progress: On track  Priority: High  Note:   Current Barriers:  Knowledge Deficits related to plan of care for management of Essential HTN, Prediabetes, Pure Hypercholesterolemia, Idiopathic chronic gout, Primary Osteoarthritis Chronic Disease Management support and education needs related to Essential HTN, Prediabetes, Pure Hypercholesterolemia, Idiopathic chronic gout, Primary Osteoarthritis  RNCM Clinical Goal(s):  Patient will demonstrate Ongoing health management independence Essential HTN, Prediabetes, Pure Hypercholesterolemia, Idiopathic chronic gout, Primary Osteoarthritis continue to work with RN Care Manager to address care management and care coordination needs related to  HTN, DMII, and Osteoarthritis through collaboration with RN Care manager, provider, and care team.   Interventions: 1:1 collaboration with primary care provider regarding development and update of comprehensive plan of care as evidenced by provider attestation and co-signature Inter-disciplinary care team collaboration (see longitudinal plan of care) Evaluation of current treatment plan related to  self management and patient's adherence to plan as established by  provider  Hypertension Interventions:  (Status:  Goal Met.) Long Term Goal Last practice recorded BP readings:  BP Readings from Last 3 Encounters:  10/27/21 120/78  10/06/21 130/78  07/05/21 (!) 142/88  Most recent eGFR/CrCl:  Lab Results  Component Value Date   EGFR 89 10/27/2021    No components found for: "CRCL" Evaluation of current treatment plan related to hypertension self management and patient's adherence to plan as established by provider Reviewed medications with patient and discussed importance of compliance Counseled on the importance of exercise goals with target of 150 minutes per week Advised patient, providing education and rationale, to monitor blood pressure daily and record, calling PCP for findings outside established parameters Provided education on prescribed diet low Sodium Discussed complications of poorly controlled blood pressure such as heart disease, stroke, circulatory complications, vision complications, kidney impairment, sexual dysfunction Assessed social determinant of health barriers  Osteoarthritis/Gout Interventions: Status: (Goal Met.) Long Term Goal  Evaluation of current treatment plan related to Osteoarthritis, self-management and patient's adherence to plan as established by provider Determined patient continues to have her Osteoarthritis and Gout managed per Rheumatology Reviewed and discussed treatment recommendations following recent follow up: Assessment / Plan:     Visit Diagnoses: 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 tophus - She has not had any signs or symptoms of a gout flare.  She has clinically been doing well taking allopurinol 300 mg 1 tablet by mouth daily.  She continues  to tolerate allopurinol without any side effects and has not missed any doses recently.  Her uric acid was within the desirable range: 4.2 on 01/14/2021.  Uric acid, CBC, and CMP will be updated today.  She will remain on the current treatment regimen.  A  refill of allopurinol was sent to the pharmacy today.  She was advised to notify us if she develops signs or symptoms of a flare.  She will follow-up in the office in 6 months.   - Plan: Uric acid  Determined patient verbalizes understanding of her prescribed treatment plan and continues to be physically active including participation in a HEP Discussed plans with patient for ongoing care management follow up and provided patient with direct contact information for care management team  Diabetes Interventions:  (Status:  Goal Met.) Long Term Goal Assessed patient's understanding of A1c goal:  <6.0 % Provided education to patient about basic DM disease process Reviewed medications with patient and discussed importance of medication adherence Counseled on importance of regular laboratory monitoring as prescribed Review of patient status, including review of consultants reports, relevant laboratory and other test results, and medications completed Educated patient on the importance on ongoing adherence to dietary and exercise recommendations, including 30 minutes of daily exercise as tolerated, following a carb friendly diet, using the plate method and portion control  Lab Results  Component Value Date   HGBA1C 5.7 (H) 10/27/2021  Hyperlipidemia Interventions:  (Status:  Goal Met.) Long Term Goal Provider established cholesterol goals reviewed Counseled on importance of regular laboratory monitoring as prescribed Reviewed importance of limiting foods high in cholesterol Reviewed exercise goals and target of 150 minutes per week  Lipid Panel     Component Value Date/Time   CHOL 185 07/28/2021 1106   TRIG 52 07/28/2021 1106   HDL 58 07/28/2021 1106   CHOLHDL 3.2 07/28/2021 1106   LDLCALC 117 (H) 07/28/2021 1106   LABVLDL 10 07/28/2021 1106     Patient Goals/Self-Care Activities: Take all medications as prescribed Attend all scheduled provider appointments Call pharmacy for medication  refills 3-7 days in advance of running out of medications Perform all self care activities independently  Perform IADL's (shopping, preparing meals, housekeeping, managing finances) independently Call provider office for new concerns or questions  drink 6 to 8 glasses of water each day manage portion size keep a blood pressure log take blood pressure log to all doctor appointments call doctor for signs and symptoms of high blood pressure take medications for blood pressure exactly as prescribed report new symptoms to your doctor  Follow Up Plan:  No further follow up required:       Barb Merino, RN, BSN, CCM Care Management Coordinator [facility]  Direct Phone: [phone]

## 2021-11-08 NOTE — Patient Instructions (Signed)
Visit Information  Thank you for taking time to visit with me today. Please don't hesitate to contact me if I can be of assistance to you before our next scheduled telephone appointment.  Following are the goals we discussed today:  Take all medications as prescribed Attend all scheduled provider appointments Call pharmacy for medication refills 3-7 days in advance of running out of medications Perform all self care activities independently  Perform IADL's (shopping, preparing meals, housekeeping, managing finances) independently Call provider office for new concerns or questions  drink 6 to 8 glasses of water each day manage portion size keep a blood pressure log take blood pressure log to all doctor appointments call doctor for signs and symptoms of high blood pressure take medications for blood pressure exactly as prescribed report new symptoms to your doctor  If you are experiencing a Mental Health or [place] or need someone to talk to, please call 1-800-273-TALK (toll free, 24 hour hotline)   Patient verbalizes understanding of instructions and care plan provided today and agrees to view in No Name. Active MyChart status and patient understanding of how to access instructions and care plan via MyChart confirmed with patient.     Barb Merino, RN, BSN, CCM Care Management Coordinator [facility]  Direct Phone: [phone]

## 2021-11-10 ENCOUNTER — Other Ambulatory Visit: Payer: Self-pay | Admitting: Obstetrics and Gynecology

## 2021-11-10 DIAGNOSIS — Z13820 Encounter for screening for osteoporosis: Secondary | ICD-10-CM

## 2021-11-15 ENCOUNTER — Ambulatory Visit: Payer: Medicare HMO | Admitting: Rheumatology

## 2021-11-15 ENCOUNTER — Encounter: Payer: Self-pay | Admitting: Rheumatology

## 2021-11-15 VITALS — BP 124/83 | HR 66 | Ht 65.0 in | Wt 228.0 lb

## 2021-11-15 DIAGNOSIS — M8589 Other specified disorders of bone density and structure, multiple sites: Secondary | ICD-10-CM | POA: Diagnosis not present

## 2021-11-15 DIAGNOSIS — M5030 Other cervical disc degeneration, unspecified cervical region: Secondary | ICD-10-CM | POA: Diagnosis not present

## 2021-11-15 DIAGNOSIS — Z5181 Encounter for therapeutic drug level monitoring: Secondary | ICD-10-CM

## 2021-11-15 DIAGNOSIS — Z8639 Personal history of other endocrine, nutritional and metabolic disease: Secondary | ICD-10-CM | POA: Diagnosis not present

## 2021-11-15 DIAGNOSIS — M19041 Primary osteoarthritis, right hand: Secondary | ICD-10-CM | POA: Diagnosis not present

## 2021-11-15 DIAGNOSIS — M19071 Primary osteoarthritis, right ankle and foot: Secondary | ICD-10-CM | POA: Diagnosis not present

## 2021-11-15 DIAGNOSIS — M170 Bilateral primary osteoarthritis of knee: Secondary | ICD-10-CM | POA: Diagnosis not present

## 2021-11-15 DIAGNOSIS — Z8679 Personal history of other diseases of the circulatory system: Secondary | ICD-10-CM

## 2021-11-15 DIAGNOSIS — M1A09X0 Idiopathic chronic gout, multiple sites, without tophus (tophi): Secondary | ICD-10-CM

## 2021-11-15 DIAGNOSIS — Z8781 Personal history of (healed) traumatic fracture: Secondary | ICD-10-CM

## 2021-11-15 DIAGNOSIS — M19072 Primary osteoarthritis, left ankle and foot: Secondary | ICD-10-CM | POA: Diagnosis not present

## 2021-11-15 DIAGNOSIS — M19042 Primary osteoarthritis, left hand: Secondary | ICD-10-CM | POA: Diagnosis not present

## 2021-11-15 LAB — COMPLETE METABOLIC PANEL WITH GFR
AG Ratio: 1.5 (calc) (ref 1.0–2.5)
ALT: 12 U/L (ref 6–29)
AST: 22 U/L (ref 10–35)
Albumin: 4.4 g/dL (ref 3.6–5.1)
Alkaline phosphatase (APISO): 68 U/L (ref 37–153)
BUN: 13 mg/dL (ref 7–25)
CO2: 31 mmol/L (ref 20–32)
Calcium: 9.6 mg/dL (ref 8.6–10.4)
Chloride: 105 mmol/L (ref 98–110)
Creat: 0.75 mg/dL (ref 0.60–1.00)
Globulin: 3 g/dL (calc) (ref 1.9–3.7)
Glucose, Bld: 91 mg/dL (ref 65–99)
Potassium: 3.9 mmol/L (ref 3.5–5.3)
Sodium: 143 mmol/L (ref 135–146)
Total Bilirubin: 0.6 mg/dL (ref 0.2–1.2)
Total Protein: 7.4 g/dL (ref 6.1–8.1)
eGFR: 85 mL/min/{1.73_m2} (ref >=60)

## 2021-11-15 LAB — CBC WITH DIFFERENTIAL/PLATELET
Absolute Monocytes: 641 cells/uL (ref 200–950)
Basophils Absolute: 58 cells/uL (ref 0–200)
Basophils Relative: 0.8 %
Eosinophils Absolute: 180 cells/uL (ref 15–500)
Eosinophils Relative: 2.5 %
HCT: 39.5 % (ref 35.0–45.0)
Hemoglobin: 13.1 g/dL (ref 11.7–15.5)
Lymphs Abs: 2066 cells/uL (ref 850–3900)
MCH: 27 pg (ref 27.0–33.0)
MCHC: 33.2 g/dL (ref 32.0–36.0)
MCV: 81.4 fL (ref 80.0–100.0)
MPV: 11.1 fL (ref 7.5–12.5)
Monocytes Relative: 8.9 %
Neutro Abs: 4255 cells/uL (ref 1500–7800)
Neutrophils Relative %: 59.1 %
Platelets: 302 10*3/uL (ref 140–400)
RBC: 4.85 10*6/uL (ref 3.80–5.10)
RDW: 14.7 % (ref 11.0–15.0)
Total Lymphocyte: 28.7 %
WBC: 7.2 10*3/uL (ref 3.8–10.8)

## 2021-11-15 LAB — URIC ACID: Uric Acid, Serum: 4.2 mg/dL (ref 2.5–7.0)

## 2021-11-16 NOTE — Progress Notes (Signed)
CBC, CMP are within normal limits.  Uric acid is in desirable range.

## 2021-11-21 DIAGNOSIS — H2513 Age-related nuclear cataract, bilateral: Secondary | ICD-10-CM | POA: Diagnosis not present

## 2021-11-21 DIAGNOSIS — H34832 Tributary (branch) retinal vein occlusion, left eye, with macular edema: Secondary | ICD-10-CM | POA: Diagnosis not present

## 2021-11-21 DIAGNOSIS — H43813 Vitreous degeneration, bilateral: Secondary | ICD-10-CM | POA: Diagnosis not present

## 2021-11-21 DIAGNOSIS — H35033 Hypertensive retinopathy, bilateral: Secondary | ICD-10-CM | POA: Diagnosis not present

## 2021-11-24 ENCOUNTER — Ambulatory Visit
Admission: RE | Admit: 2021-11-24 | Discharge: 2021-11-24 | Disposition: A | Discharge status: Home / Self Care | Payer: Medicare HMO | Source: Ambulatory Visit | Attending: Obstetrics and Gynecology | Admitting: Obstetrics and Gynecology

## 2021-11-24 DIAGNOSIS — Z780 Asymptomatic menopausal state: Secondary | ICD-10-CM | POA: Diagnosis not present

## 2021-11-24 DIAGNOSIS — M85852 Other specified disorders of bone density and structure, left thigh: Secondary | ICD-10-CM | POA: Diagnosis not present

## 2021-11-24 DIAGNOSIS — Z13820 Encounter for screening for osteoporosis: Secondary | ICD-10-CM

## 2021-11-28 DIAGNOSIS — M19042 Primary osteoarthritis, left hand: Secondary | ICD-10-CM

## 2021-11-28 DIAGNOSIS — M19041 Primary osteoarthritis, right hand: Secondary | ICD-10-CM

## 2021-11-28 DIAGNOSIS — I10 Essential (primary) hypertension: Secondary | ICD-10-CM

## 2021-11-28 DIAGNOSIS — E7800 Pure hypercholesterolemia, unspecified: Secondary | ICD-10-CM | POA: Diagnosis not present

## 2021-12-20 ENCOUNTER — Telehealth: Payer: Medicare HMO

## 2022-01-03 DIAGNOSIS — G4733 Obstructive sleep apnea (adult) (pediatric): Secondary | ICD-10-CM | POA: Diagnosis not present

## 2022-01-09 ENCOUNTER — Other Ambulatory Visit: Payer: Self-pay | Admitting: Internal Medicine

## 2022-01-09 DIAGNOSIS — Z1231 Encounter for screening mammogram for malignant neoplasm of breast: Secondary | ICD-10-CM

## 2022-01-19 ENCOUNTER — Telehealth: Payer: Self-pay

## 2022-01-19 NOTE — Progress Notes (Signed)
Care Gap(s) Not Met that Need to be Addressed:  Kidney Health Evaluation for Patients with Diabetes  Action Taken: Reviewed chart last eGFR= 89 10-27-2021. Last Uacr= 10 01-27-2021. Physical 02-14-2022. Sent message to team to order Uacr   Follow Up: Follow up on 02-16-2022  [place] Pharmacist Assistant [phone]

## 2022-01-27 ENCOUNTER — Telehealth: Payer: Self-pay | Admitting: Rheumatology

## 2022-01-27 NOTE — Telephone Encounter (Signed)
Patient called stating her handicap placard will expire on 02/07/22.  Patient states the last one she received was for 6 months and she needs one for 5 years which she states Dr. Estanislado Pandy has given her in the past.

## 2022-01-27 NOTE — Telephone Encounter (Signed)
Patient advised she may coe by the office to pick up handicap placard form.

## 2022-01-27 NOTE — Telephone Encounter (Signed)
Ok to provide permanent handicap placard.

## 2022-02-14 ENCOUNTER — Encounter: Payer: Self-pay | Admitting: Internal Medicine

## 2022-02-14 ENCOUNTER — Ambulatory Visit (INDEPENDENT_AMBULATORY_CARE_PROVIDER_SITE_OTHER): Payer: Medicare HMO | Admitting: Internal Medicine

## 2022-02-14 VITALS — BP 120/82 | HR 66 | Temp 97.9°F | Ht 64.6 in | Wt 227.2 lb

## 2022-02-14 DIAGNOSIS — Z6838 Body mass index (BMI) 38.0-38.9, adult: Secondary | ICD-10-CM

## 2022-02-14 DIAGNOSIS — R7303 Prediabetes: Secondary | ICD-10-CM

## 2022-02-14 DIAGNOSIS — M79641 Pain in right hand: Secondary | ICD-10-CM | POA: Diagnosis not present

## 2022-02-14 DIAGNOSIS — Z0000 Encounter for general adult medical examination without abnormal findings: Secondary | ICD-10-CM | POA: Diagnosis not present

## 2022-02-14 DIAGNOSIS — I10 Essential (primary) hypertension: Secondary | ICD-10-CM

## 2022-02-14 LAB — POCT URINALYSIS DIPSTICK
Bilirubin, UA: NEGATIVE
Glucose, UA: NEGATIVE
Ketones, UA: NEGATIVE
Leukocytes, UA: NEGATIVE
Nitrite, UA: NEGATIVE
Protein, UA: NEGATIVE
Spec Grav, UA: 1.015 (ref 1.010–1.025)
Urobilinogen, UA: 0.2 E.U./dL
pH, UA: 7 (ref 5.0–8.0)

## 2022-02-14 NOTE — Patient Instructions (Signed)

## 2022-02-14 NOTE — Progress Notes (Signed)
Rich Brave [place],acting as a Education administrator for Maximino Greenland, MD.,have documented all relevant documentation on the behalf of Maximino Greenland, MD,as directed by  Maximino Greenland, MD while in the presence of Maximino Greenland, MD.   Subjective:     Patient ID: Robin Odom , female    DOB: [birth year] , 71 y.o.   MRN: [id number]   Chief Complaint  Patient presents with   Annual Exam   Hypertension    HPI  Patient presents today for hm. She is still followed by GYN for her pelvic exams. She reports compliance with meds. She denies headaches, chest pain and shortness of breath. She is traveling to [place] this weekend for her sister's bday.  Hypertension This is a chronic problem. The current episode started more than 1 year ago. The problem has been gradually improving since onset. The problem is uncontrolled. Pertinent negatives include no blurred vision, chest pain, palpitations or shortness of breath. Risk factors for coronary artery disease include obesity, post-menopausal state and sedentary lifestyle. Past treatments include angiotensin blockers and calcium channel blockers. The current treatment provides moderate improvement. Compliance problems include exercise.      Past Medical History:  Diagnosis Date   Arthritis    Diabetes mellitus without complication ([place])    Gout    Hypertension    Partial retinal tear of both eyes without detachment 08/29/2017     Family History  Problem Relation Age of Onset   Heart disease Mother    Hypertension Mother    Diabetes Mother    Arthritis Father    Hypertension Father    Prostate cancer Father    Parkinson's disease Father    Heart disease Father    Sinusitis Sister    Allergic rhinitis Sister    Diabetes Sister    High Cholesterol Sister    Hypertension Sister    Diabetes Brother    Hypertension Brother    High Cholesterol Brother    Hypertension Brother    Angioedema Neg Hx    Asthma Neg Hx    Eczema Neg Hx    Immunodeficiency  Neg Hx    Urticaria Neg Hx    Breast cancer Neg Hx      Current Outpatient Medications:    allopurinol (ZYLOPRIM) 300 MG tablet, TAKE 1 TABLET BY MOUTH EVERY DAY, Disp: 90 tablet, Rfl: 0   amLODipine (NORVASC) 10 MG tablet, TAKE 1 TABLET BY MOUTH EVERY DAY, Disp: 90 tablet, Rfl: 1   Ascorbic Acid (VITAMIN C PO), Take 1,500 mg by mouth daily at 6 (six) AM., Disp: , Rfl:    B Complex Vitamins (VITAMIN-B COMPLEX) TABS, Take by mouth., Disp: , Rfl:    BENFOTIAMINE PO, Take by mouth 2 (two) times daily., Disp: , Rfl:    Calcium Carb-Cholecalciferol 500-10 MG-MCG TABS, Take 1 tablet by mouth daily., Disp: , Rfl:    carvedilol (COREG) 6.25 MG tablet, Take 1 tablet (6.25 mg total) by mouth 2 (two) times daily., Disp: 180 tablet, Rfl: 3   Cholecalciferol (VITAMIN D3) 125 MCG (5000 UT) CAPS, Take by mouth. , Disp: , Rfl:    colchicine 0.6 MG tablet, Take 0.6 mg by mouth as needed., Disp: , Rfl:    Cyanocobalamin (B-12) 5000 MCG CAPS, Take 1 capsule by mouth daily at 12 noon., Disp: , Rfl:    furosemide (LASIX) 20 MG tablet, TAKE 1 TABLET BY MOUTH EVERY DAY, Disp: 90 tablet, Rfl: 1   magnesium oxide (MAG-OX) 400  MG tablet, Take 400 mg by mouth daily., Disp: , Rfl:    melatonin 3 MG TABS tablet, Take 3 mg by mouth at bedtime., Disp: , Rfl:    olmesartan (BENICAR) 40 MG tablet, TAKE 1 TABLET BY MOUTH EVERY DAY, Disp: 90 tablet, Rfl: 2   Omega-3 Fatty Acids (OMEGA 3 PO), Take 2,500 mg by mouth daily. 2 tablets by mouth daily, Disp: , Rfl:    potassium chloride (MICRO-K) 10 MEQ CR capsule, TAKE 1 CAPSULE BY MOUTH EVERY DAY, Disp: 90 capsule, Rfl: 1   triamcinolone cream (KENALOG) 0.1 %, APPLY TO AFFECTED AREA TWICE DAILY AS NEEDED, Disp: 45 g, Rfl: 0   TURMERIC CURCUMIN PO, Take by mouth daily., Disp: , Rfl:    Allergies  Allergen Reactions   Benadryl [Diphenhydramine] Other (See Comments)    Temporary Amnesia    Codeine Other (See Comments)    Hallucinations   Penicillins Hives      The patient  states she uses post menopausal status for birth control. Last LMP was No LMP recorded. Patient has had a hysterectomy.. Negative for Dysmenorrhea. Negative for: breast discharge, breast lump(s), breast pain and breast self exam. Associated symptoms include abnormal vaginal bleeding. Pertinent negatives include abnormal bleeding (hematology), anxiety, decreased libido, depression, difficulty falling sleep, dyspareunia, history of infertility, nocturia, sexual dysfunction, sleep disturbances, urinary incontinence, urinary urgency, vaginal discharge and vaginal itching. Diet regular.    . The patient's tobacco use is:  Social History   Tobacco Use  Smoking Status Never  Smokeless Tobacco Never  . She has been exposed to passive smoke. The patient's alcohol use is:  Social History   Substance and Sexual Activity  Alcohol Use Yes   Comment: rarely   Review of Systems  Constitutional: Negative.   HENT: Negative.    Eyes: Negative.  Negative for blurred vision.  Respiratory: Negative.  Negative for shortness of breath.   Cardiovascular: Negative.  Negative for chest pain and palpitations.  Gastrointestinal: Negative.   Endocrine: Negative.   Genitourinary: Negative.   Musculoskeletal:  Positive for arthralgias.       She c/o right hand pain. Denies fall/trauma.   Skin: Negative.   Allergic/Immunologic: Negative.   Neurological: Negative.   Hematological: Negative.   Psychiatric/Behavioral: Negative.       Today's Vitals   02/14/22 1043  BP: 120/82  Pulse: 66  Temp: 97.9 F (36.6 C)  Weight: 227 lb 3.2 oz (103.1 kg)  Height: 5' 4.6" (1.641 m)  PainSc: 5   PainLoc: Arm   Body mass index is 38.28 kg/m.  Wt Readings from Last 3 Encounters:  02/14/22 227 lb 3.2 oz (103.1 kg)  11/15/21 228 lb (103.4 kg)  10/27/21 227 lb 3.2 oz (103.1 kg)     Objective:  Physical Exam Vitals and nursing note reviewed.  Constitutional:      Appearance: Normal appearance. She is obese.  HENT:      Head: Normocephalic and atraumatic.     Right Ear: Tympanic membrane, ear canal and external ear normal.     Left Ear: Tympanic membrane, ear canal and external ear normal.     Nose:     Comments: Masked     Mouth/Throat:     Comments: Masked  Eyes:     Extraocular Movements: Extraocular movements intact.     Conjunctiva/sclera: Conjunctivae normal.     Pupils: Pupils are equal, round, and reactive to light.  Cardiovascular:     Rate and Rhythm: Normal rate  and regular rhythm.     Pulses: Normal pulses.     Heart sounds: Normal heart sounds.  Pulmonary:     Effort: Pulmonary effort is normal.     Breath sounds: Normal breath sounds.  Chest:  Breasts:    Tanner Score is 5.     Right: Normal.     Left: Normal.  Abdominal:     General: Bowel sounds are normal.     Palpations: Abdomen is soft.  Genitourinary:    Comments: deferred Musculoskeletal:        General: No tenderness or signs of injury. Normal range of motion.     Cervical back: Normal range of motion and neck supple.     Comments: Neg squeeze test b/l  Skin:    General: Skin is warm and dry.  Neurological:     General: No focal deficit present.     Mental Status: She is alert and oriented to person, place, and time.  Psychiatric:        Mood and Affect: Mood normal.        Behavior: Behavior normal.      Assessment And Plan:     1. Encounter for general adult medical examination w/o abnormal findings Comments: A full exam was performed. Importance of monthly self breast exams was discussed with the patient. PATIENT IS ADVISED TO GET 30-45 MINUTES REGULAR EXERCISE NO LESS THAN FOUR TO FIVE DAYS PER WEEK - BOTH WEIGHTBEARING EXERCISES AND AEROBIC ARE RECOMMENDED.  PATIENT IS ADVISED TO FOLLOW A HEALTHY DIET WITH AT LEAST SIX FRUITS/VEGGIES PER DAY, DECREASE INTAKE OF RED MEAT, AND TO INCREASE FISH INTAKE TO TWO DAYS PER WEEK.  MEATS/FISH SHOULD NOT BE FRIED, BAKED OR BROILED IS PREFERABLE.  IT IS ALSO IMPORTANT  TO CUT BACK ON YOUR SUGAR INTAKE. PLEASE AVOID ANYTHING WITH ADDED SUGAR, CORN SYRUP OR OTHER SWEETENERS. IF YOU MUST USE A SWEETENER, YOU CAN TRY STEVIA. IT IS ALSO IMPORTANT TO AVOID ARTIFICIALLY SWEETENERS AND DIET BEVERAGES. LASTLY, I SUGGEST WEARING SPF 50 SUNSCREEN ON EXPOSED PARTS AND ESPECIALLY WHEN IN THE DIRECT SUNLIGHT FOR AN EXTENDED PERIOD OF TIME.  PLEASE AVOID FAST FOOD RESTAURANTS AND INCREASE YOUR WATER INTAKE.  2. Essential hypertension, benign Comments: Chronic, well controlled. EKG performed, NSR w/ RAE, nonspecific T abnormality. She will f/u in 6 months, encouraged to follow low sodium diet.  - POCT Urinalysis Dipstick (81002) - Microalbumin / Creatinine Urine Ratio - EKG 12-Lead - Insulin, random(561) - CMP14+EGFR - Lipid panel  3. Right hand pain Comments: I will check an arthritis panel; however, I think her sx are due to tendinitis.  - ANA, IFA (with reflex) - CYCLIC CITRUL PEPTIDE ANTIBODY, IGG/IGA - Rheumatoid factor - Sedimentation rate - Uric acid  4. Prediabetes Comments: I will check labs as below. Previously on Rybelsus; however, she is no longer taking the medication.  - Hemoglobin A1c  5. Class 2 severe obesity due to excess calories with serious comorbidity and body mass index (BMI) of 38.0 to 38.9 in adult [facility]) Comments: She is encouraged to aim for at least 150 minutes of exercise per week, while striving for BMI<30 to decrease cardiac risk.    Patient was given opportunity to ask questions. Patient verbalized understanding of the plan and was able to repeat key elements of the plan. All questions were answered to their satisfaction.   I, Maximino Greenland, MD, have reviewed all documentation for this visit. The documentation on 02/14/22 for the exam, diagnosis, procedures,  and orders are all accurate and complete.   THE PATIENT IS ENCOURAGED TO PRACTICE SOCIAL DISTANCING DUE TO THE COVID-19 PANDEMIC.

## 2022-02-15 LAB — CMP14+EGFR
ALT: 10 IU/L (ref 0–32)
AST: 19 IU/L (ref 0–40)
Albumin/Globulin Ratio: 1.5 (ref 1.2–2.2)
Albumin: 4.2 g/dL (ref 3.8–4.8)
Alkaline Phosphatase: 77 IU/L (ref 44–121)
BUN/Creatinine Ratio: 17 (ref 12–28)
BUN: 12 mg/dL (ref 8–27)
Bilirubin Total: 0.5 mg/dL (ref 0.0–1.2)
CO2: 26 mmol/L (ref 20–29)
Calcium: 9.7 mg/dL (ref 8.7–10.3)
Chloride: 105 mmol/L (ref 96–106)
Creatinine, Ser: 0.71 mg/dL (ref 0.57–1.00)
Globulin, Total: 2.8 g/dL (ref 1.5–4.5)
Glucose: 99 mg/dL (ref 70–99)
Potassium: 4 mmol/L (ref 3.5–5.2)
Sodium: 144 mmol/L (ref 134–144)
Total Protein: 7 g/dL (ref 6.0–8.5)
eGFR: 90 mL/min/{1.73_m2} (ref >59)

## 2022-02-15 LAB — RHEUMATOID FACTOR: Rheumatoid fact SerPl-aCnc: <10 IU/mL (ref <14.0)

## 2022-02-15 LAB — LIPID PANEL
Chol/HDL Ratio: 3 ratio (ref 0.0–4.4)
Cholesterol, Total: 177 mg/dL (ref 100–199)
HDL: 60 mg/dL (ref >39)
LDL Chol Calc (NIH): 107 mg/dL — ABNORMAL HIGH (ref 0–99)
Triglycerides: 49 mg/dL (ref 0–149)
VLDL Cholesterol Cal: 10 mg/dL (ref 5–40)

## 2022-02-15 LAB — ANTINUCLEAR ANTIBODIES, IFA: ANA Titer 1: NEGATIVE

## 2022-02-15 LAB — MICROALBUMIN / CREATININE URINE RATIO
Creatinine, Urine: 18.9 mg/dL
Microalb/Creat Ratio: <16 mg/g creat (ref 0–29)
Microalbumin, Urine: <3 ug/mL

## 2022-02-15 LAB — HEMOGLOBIN A1C
Est. average glucose Bld gHb Est-mCnc: 120 mg/dL
Hgb A1c MFr Bld: 5.8 % — ABNORMAL HIGH (ref 4.8–5.6)

## 2022-02-15 LAB — INSULIN, RANDOM: INSULIN: 14.8 u[IU]/mL (ref 2.6–24.9)

## 2022-02-15 LAB — CYCLIC CITRUL PEPTIDE ANTIBODY, IGG/IGA: Cyclic Citrullin Peptide Ab: 2 units (ref 0–19)

## 2022-02-15 LAB — URIC ACID: Uric Acid: 3.9 mg/dL (ref 3.1–7.9)

## 2022-02-15 LAB — SEDIMENTATION RATE: Sed Rate: 22 mm/hr (ref 0–40)

## 2022-02-20 ENCOUNTER — Encounter: Payer: Self-pay | Admitting: Internal Medicine

## 2022-02-27 ENCOUNTER — Ambulatory Visit
Admission: RE | Admit: 2022-02-27 | Discharge: 2022-02-27 | Disposition: A | Discharge status: Home / Self Care | Payer: Medicare HMO | Source: Ambulatory Visit | Attending: Internal Medicine | Admitting: Internal Medicine

## 2022-02-27 DIAGNOSIS — Z1231 Encounter for screening mammogram for malignant neoplasm of breast: Secondary | ICD-10-CM

## 2022-02-28 ENCOUNTER — Other Ambulatory Visit: Payer: Self-pay | Admitting: Physician Assistant

## 2022-02-28 ENCOUNTER — Other Ambulatory Visit: Payer: Self-pay | Admitting: Internal Medicine

## 2022-02-28 NOTE — Telephone Encounter (Signed)
Next Visit: 05/16/2022  Last Visit: 11/15/2021  Last Fill: 10/31/2021  DX: 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 tophus  Current Dose per office note 11/15/2021: allopurinol 300 mg 1 tablet by mouth daily  Labs: 02/14/2022 CMP WNL, Uric Acid 3.9, CBC 11/15/2021 WNL  Okay to refill A;llopurinol?

## 2022-03-13 DIAGNOSIS — M13841 Other specified arthritis, right hand: Secondary | ICD-10-CM | POA: Diagnosis not present

## 2022-03-13 DIAGNOSIS — M67441 Ganglion, right hand: Secondary | ICD-10-CM | POA: Diagnosis not present

## 2022-03-13 DIAGNOSIS — G5602 Carpal tunnel syndrome, left upper limb: Secondary | ICD-10-CM | POA: Diagnosis not present

## 2022-03-13 DIAGNOSIS — M79642 Pain in left hand: Secondary | ICD-10-CM | POA: Diagnosis not present

## 2022-03-13 DIAGNOSIS — M67442 Ganglion, left hand: Secondary | ICD-10-CM | POA: Diagnosis not present

## 2022-03-13 DIAGNOSIS — M79641 Pain in right hand: Secondary | ICD-10-CM | POA: Diagnosis not present

## 2022-04-03 DIAGNOSIS — G4733 Obstructive sleep apnea (adult) (pediatric): Secondary | ICD-10-CM | POA: Diagnosis not present

## 2022-04-10 ENCOUNTER — Encounter: Payer: Self-pay | Admitting: Internal Medicine

## 2022-04-10 ENCOUNTER — Other Ambulatory Visit: Payer: Self-pay | Admitting: Internal Medicine

## 2022-04-10 DIAGNOSIS — M79642 Pain in left hand: Secondary | ICD-10-CM | POA: Diagnosis not present

## 2022-04-10 DIAGNOSIS — M67441 Ganglion, right hand: Secondary | ICD-10-CM | POA: Diagnosis not present

## 2022-04-10 DIAGNOSIS — M79641 Pain in right hand: Secondary | ICD-10-CM | POA: Diagnosis not present

## 2022-04-10 DIAGNOSIS — G4733 Obstructive sleep apnea (adult) (pediatric): Secondary | ICD-10-CM

## 2022-04-10 DIAGNOSIS — M67442 Ganglion, left hand: Secondary | ICD-10-CM | POA: Diagnosis not present

## 2022-04-10 DIAGNOSIS — E7800 Pure hypercholesterolemia, unspecified: Secondary | ICD-10-CM

## 2022-04-10 NOTE — Progress Notes (Signed)
Cardiology Office Note:    Date:  04/12/2022   ID:  Robin Odom, DOB 1950/06/13, MRN [id number]  PCP:  Glendale Chard, MD  Cardiologist:  Buford Dresser, MD  Referring MD: Glendale Chard, MD   CC: follow up  History of Present Illness:    Robin Odom is a 71 y.o. female with a hx of hypertension, asthma who is seen for follow up today. I initially met her 08/02/20 as a new consult at the request of Glendale Chard, MD for the evaluation and management of dyspnea on exertion.  CV risk: hypertension, hyperlipidemia, family history of heart disease. OSA on CPAP.  CV history: Treadmill stress with hypertensive response to exercise; echo also with diastolic dysfunction and mildly elevated filling pressure based on tissue doppler. LVEF normal, no significant valve disease.  At her last visit she confirmed well controlled blood pressures at home. Her LE edema was managed with Lasix. Per KPN, Tchol 188, HDL 54, LDL 122, TG 63 07/08/20. We discussed her elevated ASCVD risk and recommendations for statins. She declined.  Today, she reports improvement in her sleeping patterns. At the end of October she began drinking a sleep-aid tea each night, which is helping her significantly. She has more energy during the day.  For a short time recently she was out of Lasix and had difficulty receiving her refill. During that time she did not have issues with shortness of breath. However, at times she has had significant LE edema.  Additionally her joint pain has been "out of control," mostly attributable to her arthritis and the colder weather. Her arthralgias have been limiting her exercise. She plans to keep working on increasing her walking time.  This fall she restarted taking a citrus bergamot supplement. She had noticed that her LDL increased by 10 points during a time when she had stopped the supplement. Regarding her diet she does not eat red meats. She only has bacon once a quarter. Other dietary  changes include switching to a vegan butter.  She denies any palpitations, chest pain, lightheadedness, headaches, syncope, orthopnea, or PND.   Past Medical History:  Diagnosis Date   Arthritis    Diabetes mellitus without complication ([place])    Gout    Hypertension    Partial retinal tear of both eyes without detachment 08/29/2017    Past Surgical History:  Procedure Laterality Date   ABDOMINAL HYSTERECTOMY  1991   CARPAL TUNNEL RELEASE Right 2019   COLONOSCOPY     5 between 1994-2010   Left knee surgery  2006   ROTATOR CUFF REPAIR Left 2001   ROTATOR CUFF REPAIR Right 2003   SHOULDER SURGERY  2001/2003   TONSILECTOMY/ADENOIDECTOMY WITH MYRINGOTOMY  1971    Current Medications: Current Outpatient Medications on File Prior to Visit  Medication Sig   allopurinol (ZYLOPRIM) 300 MG tablet TAKE 1 TABLET BY MOUTH EVERY DAY   amLODipine (NORVASC) 10 MG tablet TAKE 1 TABLET BY MOUTH EVERY DAY   Ascorbic Acid (VITAMIN C PO) Take 1,500 mg by mouth daily at 6 (six) AM.   B Complex Vitamins (VITAMIN-B COMPLEX) TABS Take by mouth.   BENFOTIAMINE PO Take by mouth 2 (two) times daily.   Calcium Carb-Cholecalciferol 500-10 MG-MCG TABS Take 1 tablet by mouth daily.   carvedilol (COREG) 6.25 MG tablet Take 1 tablet (6.25 mg total) by mouth 2 (two) times daily.   Cholecalciferol (VITAMIN D3) 125 MCG (5000 UT) CAPS Take by mouth.    colchicine 0.6 MG tablet  Take 0.6 mg by mouth as needed.   Cyanocobalamin (B-12) 5000 MCG CAPS Take 1 capsule by mouth daily at 12 noon.   furosemide (LASIX) 20 MG tablet TAKE 1 TABLET BY MOUTH EVERY DAY   magnesium oxide (MAG-OX) 400 MG tablet Take 400 mg by mouth daily.   melatonin 3 MG TABS tablet Take 3 mg by mouth at bedtime.   olmesartan (BENICAR) 40 MG tablet TAKE 1 TABLET BY MOUTH EVERY DAY   Omega-3 Fatty Acids (OMEGA 3 PO) Take 2,500 mg by mouth daily. 2 tablets by mouth daily   potassium chloride (MICRO-K) 10 MEQ CR capsule TAKE 1 CAPSULE BY MOUTH  EVERY DAY   triamcinolone cream (KENALOG) 0.1 % APPLY TO AFFECTED AREA TWICE DAILY AS NEEDED   TURMERIC CURCUMIN PO Take by mouth daily.   No current facility-administered medications on file prior to visit.     Allergies:   Benadryl [diphenhydramine], Codeine, and Penicillins   Social History   Tobacco Use   Smoking status: Never   Smokeless tobacco: Never  Vaping Use   Vaping Use: Never used  Substance Use Topics   Alcohol use: Yes    Comment: rarely   Drug use: No    Family History: family history includes Allergic rhinitis in her sister; Arthritis in her father; Diabetes in her brother, mother, and sister; Heart disease in her father and mother; High Cholesterol in her brother and sister; Hypertension in her brother, brother, father, mother, and sister; Parkinson's disease in her father; Prostate cancer in her father; Sinusitis in her sister. There is no history of Angioedema, Asthma, Eczema, Immunodeficiency, Urticaria, or Breast cancer.   3/4 of her siblings has HTN. 2/4 of her siblings has DM. Mother had Type II DM(most of maternal siblings had DM). Mother had unknown CAD and underwent a bypass surgery three years prior to death of a MI in 105. Most of her maternal sibling died from MI. Father had Type II  DM(Paternal sibling no DM). Unaware of any strokes on her fathers side, mostly cancer.    ROS:   Please see the history of present illness.   (+) Arthralgias Additional pertinent ROS otherwise unremarkable.  EKGs/Labs/Other Studies Reviewed:    The following studies were reviewed today:  Echo 08/27/20 1. Left ventricular ejection fraction, by estimation, is 60 to 65%. The  left ventricle has normal function. The left ventricle has no regional  wall motion abnormalities. Left ventricular diastolic parameters are  consistent with Grade I diastolic  dysfunction (impaired relaxation). Elevated left atrial pressure. The E/e'  is 30.   2. Right ventricular systolic  function is normal. The right ventricular  size is normal.   3. Left atrial size was mildly dilated.   4. The mitral valve is normal in structure. No evidence of mitral valve  regurgitation. No evidence of mitral stenosis.   5. The aortic valve is normal in structure. Aortic valve regurgitation is  not visualized. No aortic stenosis is present.   6. The inferior vena cava is normal in size with greater than 50%  respiratory variability, suggesting right atrial pressure of 3 mmHg.   ETT 08/18/20  Blood pressure demonstrated a hypertensive response to exercise. There was no ST segment deviation noted during stress.   Normal ETT HTN response to exercise   EKG:  EKG was personally reviewed today 04/11/2022: not ordered 10/06/2021:  not ordered 12/30/20: NSR at 69 bpm, PRWP  07/31/20: NSR at 74 bpm  Recent Labs: 11/15/2021: Hemoglobin  13.1; Platelets 302 02/14/2022: ALT 10; BUN 12; Creatinine, Ser 0.71; Potassium 4.0; Sodium 144   Recent Lipid Panel    Component Value Date/Time   CHOL 177 02/14/2022 1209   TRIG 49 02/14/2022 1209   HDL 60 02/14/2022 1209   CHOLHDL 3.0 02/14/2022 1209   LDLCALC 107 (H) 02/14/2022 1209    Physical Exam:    VS:  BP 131/83 (BP Location: Left Arm, Patient Position: Sitting, Cuff Size: Large)   Pulse 71   Ht _0  (1.626 m)   Wt 233 lb 14.4 oz (106.1 kg)   SpO2 95%   BMI 40.15 kg/m     Wt Readings from Last 3 Encounters:  04/11/22 233 lb 14.4 oz (106.1 kg)  02/14/22 227 lb 3.2 oz (103.1 kg)  11/15/21 228 lb (103.4 kg)    GEN: Well nourished, well developed in no acute distress HEENT: Normal, moist mucous membranes NECK: No JVD CARDIAC: regular rhythm, normal S1 and S2, no rubs or gallops. No murmur. VASCULAR: Radial and DP pulses 2+ bilaterally. No carotid bruits RESPIRATORY:  Clear to auscultation without rales, wheezing or rhonchi  ABDOMEN: Soft, non-tender, non-distended MUSCULOSKELETAL:  Ambulates independently SKIN: Warm and dry, no  significant ankle edema, palpable bilateral ankle bursa NEUROLOGIC:  Alert and oriented x 3. No focal neuro deficits noted. PSYCHIATRIC:  Normal affect     ASSESSMENT:    1. Essential hypertension   2. At increased risk for cardiovascular disease   3. DOE (dyspnea on exertion)   4. Class 3 severe obesity due to excess calories with serious comorbidity and body mass index (BMI) of 40.0 to 44.9 in adult [facility])     PLAN:    Hypertension: goal <130/80 -Both echo and treadmill stress suggest that elevated blood pressure may be causing some of her dyspnea on exertion, both diastolic dysfunction and hypertensive BP response to exercise. Otherwise, structurally normal heart, no ischemia -Her LE swelling is predominantly ankle bursa, with only trace edema -Home blood pressures reviewed, well controlled.  Current medications Amlodipine 10 mg daily (at night) Furosemide 20 mg (usually once/day, two if swollen) Carvedilol 6.25 mg BID Olmesartan 40 mg daily (in the morning) Never been on spironolactone. Consider if additional med needed  -discussed exercise, sodium, how lifestyle can also affect BP  Hyperlipidemia: Elevated ASCVD risk score -per KPN, Tchol 188, HDL 54, LDL 122, TG 63 07/08/20 -we discussed her elevated ASCVD risk and recommendations for statins. She declines.  Class 2 obesity: working on weight loss, BMI 40 today  Cardiac risk counseling and prevention recommendations: -recommend heart healthy/Mediterranean diet, with whole grains, fruits, vegetable, fish, lean meats, nuts, and olive oil. Limit salt. -recommend moderate walking, 3-5 times/week for 30-50 minutes each session. Aim for at least 150 minutes.week. Goal should be pace of 3 miles/hours, or walking 1.5 miles in 30 minutes -recommend avoidance of tobacco products. Avoid excess alcohol. -ASCVD risk score: The 10-year ASCVD risk score (Arnett DK, et al., 2019) is: 27.6%   Values used to calculate the score:     Age:  10 years     Sex: Female     Is Non-Hispanic African American: Yes     Diabetic: Yes     Tobacco smoker: No     Systolic Blood Pressure: 056 mmHg     Is BP treated: Yes     HDL Cholesterol: 60 mg/dL     Total Cholesterol: 177 mg/dL    Plan for follow up: 6 months or sooner as  needed  Buford Dresser, MD, [name], [facility] at [facility] [address] [place], [place] [address] [phone]   Medication Adjustments/Labs and Tests Ordered: Current medicines are reviewed at length with the patient today.  Concerns regarding medicines are outlined above.   No orders of the defined types were placed in this encounter.  No orders of the defined types were placed in this encounter.  Patient Instructions  Medication Instructions:  Your Physician recommend you continue on your current medication as directed.    *If you need a refill on your cardiac medications before your next appointment, please call your pharmacy*  Follow-Up: At [facility], you and your health needs are our priority.  As part of our continuing mission to provide you with exceptional heart care, we have created designated Provider Care Teams.  These Care Teams include your primary Cardiologist (physician) and Advanced Practice Providers (APPs -  Physician Assistants and Nurse Practitioners) who all work together to provide you with the care you need, when you need it.  We recommend signing up for the patient portal called "MyChart".  Sign up information is provided on this After Visit Summary.  MyChart is used to connect with patients for Virtual Visits (Telemedicine).  Patients are able to view lab/test results, encounter notes, upcoming appointments, etc.  Non-urgent messages can be sent to your provider as well.   To learn more about what you can do with MyChart, go to [email or web address].    Your  next appointment:   6 month(s)  The format for your next appointment:   In Person  Provider:   Buford Dresser, MD        [facility] Stumpf,acting as a scribe for Buford Dresser, MD.,have documented all relevant documentation on the behalf of Buford Dresser, MD,as directed by  Buford Dresser, MD while in the presence of Buford Dresser, MD.  I, Buford Dresser, MD, have reviewed all documentation for this visit. The documentation on 04/12/22 for the exam, diagnosis, procedures, and orders are all accurate and complete.   Signed, Buford Dresser, MD [name] 04/12/2022  [place]

## 2022-04-11 ENCOUNTER — Ambulatory Visit (HOSPITAL_BASED_OUTPATIENT_CLINIC_OR_DEPARTMENT_OTHER): Payer: Medicare HMO | Admitting: Cardiology

## 2022-04-11 ENCOUNTER — Encounter (HOSPITAL_BASED_OUTPATIENT_CLINIC_OR_DEPARTMENT_OTHER): Payer: Self-pay | Admitting: Cardiology

## 2022-04-11 VITALS — BP 131/83 | HR 71 | Ht 64.0 in | Wt 233.9 lb

## 2022-04-11 DIAGNOSIS — Z6841 Body Mass Index (BMI) 40.0 and over, adult: Secondary | ICD-10-CM

## 2022-04-11 DIAGNOSIS — I10 Essential (primary) hypertension: Secondary | ICD-10-CM | POA: Diagnosis not present

## 2022-04-11 DIAGNOSIS — R0609 Other forms of dyspnea: Secondary | ICD-10-CM

## 2022-04-11 DIAGNOSIS — Z9189 Other specified personal risk factors, not elsewhere classified: Secondary | ICD-10-CM

## 2022-04-11 NOTE — Patient Instructions (Signed)
Medication Instructions:  Your Physician recommend you continue on your current medication as directed.    *If you need a refill on your cardiac medications before your next appointment, please call your pharmacy*  Follow-Up: At [facility], you and your health needs are our priority.  As part of our continuing mission to provide you with exceptional heart care, we have created designated Provider Care Teams.  These Care Teams include your primary Cardiologist (physician) and Advanced Practice Providers (APPs -  Physician Assistants and Nurse Practitioners) who all work together to provide you with the care you need, when you need it.  We recommend signing up for the patient portal called "MyChart".  Sign up information is provided on this After Visit Summary.  MyChart is used to connect with patients for Virtual Visits (Telemedicine).  Patients are able to view lab/test results, encounter notes, upcoming appointments, etc.  Non-urgent messages can be sent to your provider as well.   To learn more about what you can do with MyChart, go to [email or web address].    Your next appointment:   6 month(s)  The format for your next appointment:   In Person  Provider:   Buford Dresser, MD

## 2022-04-12 ENCOUNTER — Encounter (HOSPITAL_BASED_OUTPATIENT_CLINIC_OR_DEPARTMENT_OTHER): Payer: Self-pay | Admitting: Cardiology

## 2022-04-18 DIAGNOSIS — H5203 Hypermetropia, bilateral: Secondary | ICD-10-CM | POA: Diagnosis not present

## 2022-04-20 ENCOUNTER — Other Ambulatory Visit (HOSPITAL_BASED_OUTPATIENT_CLINIC_OR_DEPARTMENT_OTHER): Payer: Medicare HMO

## 2022-04-21 ENCOUNTER — Ambulatory Visit (HOSPITAL_BASED_OUTPATIENT_CLINIC_OR_DEPARTMENT_OTHER)
Admission: RE | Admit: 2022-04-21 | Discharge: 2022-04-21 | Disposition: A | Discharge status: Home / Self Care | Payer: Medicare HMO | Source: Ambulatory Visit | Attending: Internal Medicine | Admitting: Internal Medicine

## 2022-04-21 DIAGNOSIS — E7800 Pure hypercholesterolemia, unspecified: Secondary | ICD-10-CM | POA: Insufficient documentation

## 2022-04-24 ENCOUNTER — Other Ambulatory Visit: Payer: Self-pay | Admitting: Internal Medicine

## 2022-04-24 DIAGNOSIS — I10 Essential (primary) hypertension: Secondary | ICD-10-CM

## 2022-04-26 ENCOUNTER — Encounter: Payer: Self-pay | Admitting: Internal Medicine

## 2022-04-29 DIAGNOSIS — Z0100 Encounter for examination of eyes and vision without abnormal findings: Secondary | ICD-10-CM | POA: Diagnosis not present

## 2022-05-05 NOTE — Progress Notes (Unsigned)
Office Visit Note  Patient: Robin Odom             Date of Birth: 10-17-1950           MRN: [id number]             PCP: Glendale Chard, MD Referring: Glendale Chard, MD Visit Date: 05/16/2022 Occupation: '@GUAROCC'$ @  Subjective:  Medication monitoring   History of Present Illness: Robin Odom is a 72 y.o. female with history of gout and osteoarthritis.  She is taking allopurinol 300 mg 1 tablet by mouth daily for management of gout.  She continues to tolerate allopurinol without any side effects and has not missed any doses recently.  She denies any signs or symptoms of a gout flare.  She has been avoiding shellfish which has been a trigger in the past.  She continues to experience intermittent arthralgias and joint stiffness due to underlying osteoarthritis.  She remains under the care of Dr. Amedeo Plenty for intermittent discomfort in her wrist joints.  She states the inflammation in her wrists is intermittent and more of an annoyance than it is painful.       Activities of Daily Living:  Patient reports morning stiffness for 0 minutes.   Patient Denies nocturnal pain.  Difficulty dressing/grooming: Denies Difficulty climbing stairs: Denies Difficulty getting out of chair: Denies Difficulty using hands for taps, buttons, cutlery, and/or writing: Denies  Review of Systems  Constitutional:  Negative for fatigue.  HENT:  Positive for mouth dryness. Negative for mouth sores and nose dryness.   Eyes:  Negative for pain, visual disturbance and dryness.  Respiratory:  Negative for cough, hemoptysis, shortness of breath and difficulty breathing.   Cardiovascular:  Negative for chest pain, palpitations, hypertension and swelling in legs/feet.  Gastrointestinal:  Negative for blood in stool, constipation and diarrhea.  Endocrine: Negative for increased urination.  Genitourinary:  Negative for painful urination.  Musculoskeletal:  Positive for joint pain, joint pain and joint swelling. Negative  for myalgias, muscle weakness, morning stiffness, muscle tenderness and myalgias.  Skin:  Negative for color change, pallor, rash, hair loss, nodules/bumps, skin tightness, ulcers and sensitivity to sunlight.  Neurological:  Negative for dizziness, numbness, headaches and weakness.  Hematological:  Negative for swollen glands.  Psychiatric/Behavioral:  Negative for depressed mood and sleep disturbance. The patient is not nervous/anxious.     PMFS History:  Patient Active Problem List   Diagnosis Date Noted   History of wheezing 03/18/2020   Osteopenia of multiple sites 10/30/2019   Prediabetes 05/14/2018   Essential hypertension, benign 05/14/2018   Class 3 severe obesity due to excess calories with serious comorbidity and body mass index (BMI) of 40.0 to 44.9 in adult ([place]) 05/14/2018   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 tophus 07/20/2016   Primary osteoarthritis of both hands 07/20/2016   Primary osteoarthritis of both knees 07/20/2016   Primary osteoarthritis of both feet 07/20/2016   DJD (degenerative joint disease), cervical 07/20/2016   Elevated CK 07/20/2016   History of humerus fracture 07/20/2016   History of hypertension 07/20/2016   History of diabetes mellitus 07/20/2016   History of hyperlipidemia 07/20/2016   Chronic rhinitis 10/20/2015   Anosmia 10/20/2015   Mild intermittent asthma 10/20/2015    Past Medical History:  Diagnosis Date   Arthritis    Diabetes mellitus without complication (HCC)    Gout    Hypertension    Partial retinal tear of both eyes without detachment 08/29/2017  Family History  Problem Relation Age of Onset   Heart disease Mother    Hypertension Mother    Diabetes Mother    Arthritis Father    Hypertension Father    Prostate cancer Father    Parkinson's disease Father    Heart disease Father    Sinusitis Sister    Allergic rhinitis Sister    Diabetes Sister    High Cholesterol Sister    Hypertension Sister     Diabetes Brother    Hypertension Brother    High Cholesterol Brother    Hypertension Brother    Angioedema Neg Hx    Asthma Neg Hx    Eczema Neg Hx    Immunodeficiency Neg Hx    Urticaria Neg Hx    Breast cancer Neg Hx    Past Surgical History:  Procedure Laterality Date   ABDOMINAL HYSTERECTOMY  1991   CARPAL TUNNEL RELEASE Right 2019   COLONOSCOPY     5 between 1994-2010   Left knee surgery  2006   ROTATOR CUFF REPAIR Left 2001   ROTATOR CUFF REPAIR Right 2003   SHOULDER SURGERY  2001/2003   TONSILECTOMY/ADENOIDECTOMY WITH MYRINGOTOMY  1971   Social History   Social History Narrative   Not on file   Immunization History  Administered Date(s) Administered   Influenza, High Dose Seasonal PF 02/05/2017, 02/12/2018, 12/16/2018, 01/13/2020, 01/18/2021   Influenza,inj,quad, With Preservative 02/13/2017, 02/01/2018   Influenza-Unspecified 02/03/2015, 01/18/2018, 01/29/2018, 12/16/2018, 01/18/2022   PFIZER(Purple Top)SARS-COV-2 Vaccination 01/01/2019, 01/23/2019, 08/19/2019, 08/13/2020   Pfizer Covid-19 Vaccine Bivalent Booster 83yr & up 04/03/2021, 01/18/2022   Pneumococcal Conjugate-13 01/20/2014   Pneumococcal Polysaccharide-23 10/09/2016   Tdap 12/13/2020   Unspecified SARS-COV-2 Vaccination 08/20/2019   Zoster Recombinat (Shingrix) 10/18/2019, 03/05/2020     Objective: Vital Signs: BP 123/82 (BP Location: Left Arm, Patient Position: Sitting, Cuff Size: Large)   Pulse 66   Resp 15   Ht '5\' 4"'$  (1.626 m)   Wt 230 lb (104.3 kg)   BMI 39.48 kg/m    Physical Exam Vitals and nursing note reviewed.  Constitutional:      Appearance: She is well-developed.  HENT:     Head: Normocephalic and atraumatic.  Eyes:     Conjunctiva/sclera: Conjunctivae normal.  Cardiovascular:     Rate and Rhythm: Normal rate and regular rhythm.     Heart sounds: Normal heart sounds.  Pulmonary:     Effort: Pulmonary effort is normal.     Breath sounds: Normal breath sounds.  Abdominal:      General: Bowel sounds are normal.     Palpations: Abdomen is soft.  Musculoskeletal:     Cervical back: Normal range of motion.  Skin:    General: Skin is warm and dry.     Capillary Refill: Capillary refill takes less than 2 seconds.  Neurological:     Mental Status: She is alert and oriented to person, place, and time.  Psychiatric:        Behavior: Behavior normal.      Musculoskeletal Exam: C-spine, thoracic spine, and lumbar spine good ROM.  Shoulder joints, elbow joints, wrist joints, Mcps, PIPs, and DIPs good ROM with no synovitis. PIP and DIP thickening. CMC joint thickening.  Complete fist formation bilaterally.  Hip joints, knee joints, and ankle joints have good ROM with no discomfort.  No warmth or effusion of knee joints.  No tenderness or swelling of ankle joints.  Thickening of both ankles.  Pedal edema in bilateral LE.  CDAI Exam: CDAI Score: -- Patient Global: --; Provider Global: -- Swollen: --; Tender: -- Joint Exam 05/16/2022   No joint exam has been documented for this visit   There is currently no information documented on the homunculus. Go to the Rheumatology activity and complete the homunculus joint exam.  Investigation: No additional findings.  Imaging: CT CARDIAC SCORING (SELF PAY ONLY)  Addendum Date: 04/25/2022   ADDENDUM REPORT: 04/25/2022 09:48 EXAM: OVER-READ INTERPRETATION  CT CHEST The following report is an over-read performed by radiologist Dr. Sabino Dick [facility] on 04/25/2022. This over-read does not include interpretation of cardiac or coronary anatomy or pathology. The coronary calcium score interpretation by the cardiologist is attached. COMPARISON:  None. FINDINGS: The visualized portions of the extracardiac vascular structures are unremarkable. Visualized mediastinum is unremarkable. Visualized portion of upper abdomen is unremarkable. Visualized pulmonary parenchyma is unremarkable. Visualized skeleton is  unremarkable. IMPRESSION: No definite abnormality seen involving the visualized extracardiac structures of the chest. Electronically Signed   By: Marijo Conception M.D.   On: 04/25/2022 09:48   Result Date: 04/25/2022 CLINICAL DATA:  Cardiovascular disease risk stratification CAD screening, intermediate CAD risk, treadmill candidate EXAM: CT Coronary Calcium Score TECHNIQUE: A gated, non-contrast computed tomography scan of the heart was performed using 71m slice thickness. Axial images were analyzed on a dedicated workstation. Calcium scoring of the coronary arteries was performed using the Agatston method. FINDINGS: Coronary Calcium Score: Left main: 0 Left anterior descending artery: 1.5 Left circumflex artery: 0 Right coronary artery: 12.7 Total: 14.2 Percentile: 54th Pericardium: Normal. Ascending Aorta: Upper limit of normal caliber for age and BSA. Ascending aorta measures approximately 352mat the mid ascending aorta measured in a non-contrast axial plane. Non-cardiac: See separate report from [facility]. IMPRESSION: Coronary calcium score of 14.2. This was 54th percentile for age-, race-, and sex-matched controls. RECOMMENDATIONS: Coronary artery calcium (CAC) score is a strong predictor of incident coronary heart disease (CHD) and provides predictive information beyond traditional risk factors. CAC scoring is reasonable to use in the decision to withhold, postpone, or initiate statin therapy in intermediate-risk or selected borderline-risk asymptomatic adults (age 73-75ears and LDL-C >=70 to <190 mg/dL) who do not have diabetes or established atherosclerotic cardiovascular disease (ASCVD).* In intermediate-risk (10-year ASCVD risk >=7.5% to <20%) adults or selected borderline-risk (10-year ASCVD risk >=5% to <7.5%) adults in whom a CAC score is measured for the purpose of making a treatment decision the following recommendations have been made: If CAC=0, it is reasonable to withhold statin  therapy and reassess in 5 to 10 years, as long as higher risk conditions are absent (diabetes mellitus, family history of premature CHD in first degree relatives (males <55 years; females <65 years), cigarette smoking, or LDL >=190 mg/dL). If CAC is 1 to 99, it is reasonable to initiate statin therapy for patients >=5530ears of age. If CAC is >=100 or >=75th percentile, it is reasonable to initiate statin therapy at any age. Cardiology referral should be considered for patients with CAC scores >=400 or >=75th percentile. *2018 AHA/ACC/AACVPR/AAPA/ABC/ACPM/ADA/AGS/APhA/ASPC/NLA/PCNA Guideline on the Management of Blood Cholesterol: A Report of the American College of Cardiology/American Heart Association Task Force on Clinical Practice Guidelines. J Am Coll Cardiol. 2019;73(24):3168-3209. Electronically Signed: By: GaCherlynn Kaiser.D. On: 04/23/2022 12:17    Recent Labs: Lab Results  Component Value Date   WBC 7.2 11/15/2021   HGB 13.1 11/15/2021   PLT 302 11/15/2021   NA 144 02/14/2022   K 4.0  02/14/2022   CL 105 02/14/2022   CO2 26 02/14/2022   GLUCOSE 99 02/14/2022   BUN 12 02/14/2022   CREATININE 0.71 02/14/2022   BILITOT 0.5 02/14/2022   ALKPHOS 77 02/14/2022   AST 19 02/14/2022   ALT 10 02/14/2022   PROT 7.0 02/14/2022   ALBUMIN 4.2 02/14/2022   CALCIUM 9.7 02/14/2022   GFRAA 77 05/13/2020    Speciality Comments: No specialty comments available.  Procedures:  No procedures performed Allergies: Benadryl [diphenhydramine], Codeine, and Penicillins   Assessment / Plan:     Visit Diagnoses: 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 tophus - She has not had any signs or symptoms of a gout flare.  She has clinically been doing well taking allopurinol 300 mg 1 tablet by mouth daily.  She continues to tolerate allopurinol without any side effects.  She has not missed any doses of allopurinol recently.  She has not needed to take colchicine recently.  She continues to try to  avoid red meat and shellfish as advised.  Uric acid: 3.9 on 02/14/2022--within the desirable range.  She will remain on allopurinol as prescribed.  She was advised to notify us if she develops signs or symptoms of a gout flare.  She will follow-up in the office in 6 months or sooner if needed.- Plan: Uric acid  Medication monitoring encounter -Allopurinol 300 mg 1 tablet by mouth daily. Uric acid was 3.9 on 02/14/22.   CMP drawn on 02/14/22.  Patient plans on having updated lab work in April and every 6 months to monitor for drug toxicity.  Future order for CBC, CMP, and uric acid level was placed today.  Plan: CBC with Differential/Platelet, COMPLETE METABOLIC PANEL WITH GFR, Uric acid  Primary osteoarthritis of both hands: She has CMC joint thickening bilaterally.  PIP and DIP thickening consistent with osteoarthritis of both hands.  Patient remains under the care of Dr. Amedeo Plenty.   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  No aggressive treatment measures were recommended. Patient was advised to notify us if she develops any new or worsening symptoms.    Primary osteoarthritis of both knees: She has good ROM of both knee joints with no discomfort.  No warmth or effusion of knee joints.    Primary osteoarthritis of both feet: Ankle joints have good range of motion with no tenderness.  Pedal edema noted bilaterally.  DDD (degenerative disc disease), cervical: C-spine has good ROM with no discomfort.  No symptoms of radiculopathy.    Osteopenia of multiple sites - DEXA updated on 11/24/21: Left femoral neck BMD 0.796 with T-score -1.1.  Previous DEXA- 10/29/2019 [facility] showed T score of -2.1 in the left femoral neck consistent with osteopenia. No recent falls or fractures.  Taking vitamin D and calcium supplements daily.  Other medical condition are listed as follows:    History of diabetes mellitus  History of hypertension: BP was 123/82 today in the office.   History of hyperlipidemia  History of humerus fracture  Orders: Orders Placed This Encounter  Procedures   CBC with Differential/Platelet   COMPLETE METABOLIC PANEL WITH GFR   Uric acid   No orders of the defined types were placed in this encounter.     Follow-Up Instructions: Return in about 6 months (around 11/14/2022) for Gout, Osteoarthritis.   Ofilia Neas, PA-C  Note - This record has been created using Dragon software.  Chart creation errors have been sought, but may not always  have been located. Such creation errors do not reflect on  the standard of medical care.

## 2022-05-10 ENCOUNTER — Other Ambulatory Visit: Payer: Self-pay | Admitting: Internal Medicine

## 2022-05-11 ENCOUNTER — Encounter: Payer: Self-pay | Admitting: Internal Medicine

## 2022-05-12 ENCOUNTER — Other Ambulatory Visit: Payer: Self-pay | Admitting: Internal Medicine

## 2022-05-12 MED ORDER — ATORVASTATIN CALCIUM 20 MG PO TABS: 20.0000 mg | ORAL_TABLET | Freq: Every day | ORAL | 11 refills | Status: DC

## 2022-05-16 ENCOUNTER — Encounter: Payer: Self-pay | Admitting: Physician Assistant

## 2022-05-16 ENCOUNTER — Ambulatory Visit: Payer: Medicare HMO | Attending: Physician Assistant | Admitting: Physician Assistant

## 2022-05-16 VITALS — BP 123/82 | HR 66 | Resp 15 | Ht 64.0 in | Wt 230.0 lb

## 2022-05-16 DIAGNOSIS — Z8781 Personal history of (healed) traumatic fracture: Secondary | ICD-10-CM | POA: Diagnosis not present

## 2022-05-16 DIAGNOSIS — M170 Bilateral primary osteoarthritis of knee: Secondary | ICD-10-CM

## 2022-05-16 DIAGNOSIS — M5030 Other cervical disc degeneration, unspecified cervical region: Secondary | ICD-10-CM

## 2022-05-16 DIAGNOSIS — M19071 Primary osteoarthritis, right ankle and foot: Secondary | ICD-10-CM

## 2022-05-16 DIAGNOSIS — M8589 Other specified disorders of bone density and structure, multiple sites: Secondary | ICD-10-CM

## 2022-05-16 DIAGNOSIS — M19042 Primary osteoarthritis, left hand: Secondary | ICD-10-CM

## 2022-05-16 DIAGNOSIS — M1A09X0 Idiopathic chronic gout, multiple sites, without tophus (tophi): Secondary | ICD-10-CM | POA: Diagnosis not present

## 2022-05-16 DIAGNOSIS — Z8639 Personal history of other endocrine, nutritional and metabolic disease: Secondary | ICD-10-CM | POA: Diagnosis not present

## 2022-05-16 DIAGNOSIS — Z5181 Encounter for therapeutic drug level monitoring: Secondary | ICD-10-CM

## 2022-05-16 DIAGNOSIS — Z8679 Personal history of other diseases of the circulatory system: Secondary | ICD-10-CM

## 2022-05-16 DIAGNOSIS — M19072 Primary osteoarthritis, left ankle and foot: Secondary | ICD-10-CM | POA: Diagnosis not present

## 2022-05-16 DIAGNOSIS — M19041 Primary osteoarthritis, right hand: Secondary | ICD-10-CM | POA: Diagnosis not present

## 2022-05-16 NOTE — Patient Instructions (Signed)
Information for patients with Gout  Gout defined-Gout occurs when urate crystals accumulate in your joint causing the inflammation and intense pain of gout attack.  Urate crystals can form when you have high levels of uric acid in your blood.  Your body produces uric acid when it breaks down prurines-substances that are found naturally in your body, as well as in certain foods such as organ meats, anchioves, herring, asparagus, and mushrooms.  Normally uric acid dissolves in your blood and passes through your kidneys into your urine.  But sometimes your body either produces too much uric acid or your kidneys excrete too little uric acid.  When this happens, uric acid can build up, forming sharp needle-like urate crystals in a joint or surrounding tissue that cause pain, inflammation and swelling.    Gout is characterized by sudden, severe attacks of pain, redness and tenderness in joints, often the joint at the base of the big toe.  Gout is complex form of arthritis that can affect anyone.  Men are more likely to get gout but women become increasingly more susceptible to gout after menopause.  An acute attack of gout can wake you up in the middle of the night with the sensation that your big toe is on fire.  The affected joint is hot, swollen and so tender that even the weight or the sheet on it may seem intolerable.  If you experience symptoms of an acute gout attack it is important to your doctor as soon as the symptoms start.  Gout that goes untreated can lead to worsening pain and joint damage.  Risk Factors:  You are more likely to develop gout if you have high levels of uric acid in your body.    Factors that increase the uric acid level in your body include:  Lifestyle factors.  Excessive alcohol use-generally more than two drinks a day for men and more than one for women increase the risk of gout.  Medical conditions.  Certain conditions make it more likely that you will develop gout.   These include hypertension, and chronic conditions such as diabetes, high levels of fat and cholesterol in the blood, and narrowing of the arteries.  Certain medications.  The uses of Thiazide diuretics- commonly used to treat hypertension and low dose aspirin can also increase uric acid levels.  Family history of gout.  If other members of your family have had gout, you are more likely to develop the disease.  Age and sex. Gout occurs more often in men than it does in women, primarily because women tend to have lower uric acid levels than men do.  Men are more likely to develop gout earlier usually between the ages of 49-50- whereas women generally develop signs and symptoms after menopause.    Tests and diagnosis:  Tests to help diagnose gout may include:  Blood test.  Your doctor may recommend a blood test to measure the uric acid level in your blood .  Blood tests can be misleading, though.  Some people have high uric acid levels but never experience gout.  And some people have signs and symptoms of gout, but don't have unusual levels of uric acid in their blood.  Joint fluid test.  Your doctor may use a needle to draw fluid from your affected joint.  When examined under the microscope, your joint fluid may reveal urate crystals.  Treatment:  Treatment for gout usually involves medications.  What medications you and your doctor choose will be  based on your current health and other medications you currently take.  Gout medications can be used to treat acute gout attacks and prevent future attacks as well as reduce your risk of complications from gout such as the development of tophi from urate crystal deposits.  Alternative medicine:   Certain foods have been studied for their potential to lower uric acid levels, including:  Coffee.  Studies have found an association between coffee drinking (regular and decaf) and lower uric acid levels.  The evidence is not enough to encourage  non-coffee drinkers to start, but it may give clues to new ways of treating gout in the future.  Vitamin C.  Supplements containing vitamin C may reduce the levels of uric acid in your blood.  However, vitamin as a treatment for gout. Don't assume that if a little vitamin C is good, than lots is better.  Megadoses of vitamin C may increase your bodies uric acid levels.  Cherries.  Cherries have been associated with lower levels of uric acid in studies, but it isn't clear if they have any effect on gout signs and symptoms.  Eating more cherries and other dar-colored fruits, such as blackberries, blueberries, purple grapes and raspberries, may be a safe way to support your gout treatment.    Lifestyle/Diet Recommendations:   Drink 8 to 16 cups ( about 2 to 4 liters) of fluid each day, with at least half being water.  Avoid alcohol  Eat a moderate amount of protein, preferably from healthy sources, such as low-fat or fat-free dairy, tofu, eggs, and nut butters.  Limit you daily intake of meat, fish, and poultry to 4 to 6 ounces.  Avoid high fat meats and desserts.  Decrease you intake of shellfish, beef, lamb, pork, eggs and cheese.  Choose a good source of vitamin C daily such as citrus fruits, strawberries, broccoli,  brussel sprouts, papaya, and cantaloupe.   Choose a good source of vitamin A every other day such as yellow fruits, or dark green/yellow vegetables.  Avoid drastic weigh reduction or fasting.  If weigh loss is desired lose it over a period of several months.  See "dietary considerations.." chart for specific food recommendations.  Dietary Considerations for people with Gout  Food with negligible purine content (0-15 mg of purine nitrogen per 100 grams food)  May use as desired except on calorie variations  Non fat milk Cocoa Cereals (except in list II) Hard candies  Buttermilk Carbonated drinks Vegetables (except in list II) Sherbet  Coffee Fruits Sugar Honey  Tea  Cottage Cheese Gelatin-jell-o Salt  Fruit juice Breads Angel food Cake   Herbs/spices Jams/Jellies Carnation Instant Breakfast    Foods that do not contain excessive purine content, but must be limited due to fat content  Cream Eggs Oil and Salad Dressing  Half and Half Peanut Butter Chocolate  Whole Milk Cakes Potato Chips  Butter Ice Cream Fried Foods  Cheese Nuts Waffles, pancakes   List II: Food with moderate purine content (50-150 mg of purine nitrogen per 100 grams of food)  Limit total amount each day to 5 oz. cooked Lean meat, other than those on list III   Poultry, other than those on list III Fish, other than those on list III   Seafood, other than those on list III  These foods may be used occasionally  Peas Lentils Bran  Spinach Oatmeal Dried Beans and Peas  Asparagus Wheat Germ Mushrooms   Additional information about meat choices  Choose fish   and poultry, particularly without skin, often.  Select lean, well trimmed cuts of meat.  Avoid all fatty meats, bacon , sausage, fried meats, fried fish, or poultry, luncheon meats, cold cuts, hot dogs, meats canned or frozen in gravy, spareribs and frozen and packaged prepared meats.   List III: Foods with HIGH purine content / Foods to AVOID (150-800 mg of purine nitrogen per 100 grams of food)  Anchovies Herring Meat Broths  Liver Mackerel Meat Extracts  Kidney Scallops Meat Drippings  Sardines Wild Game Mincemeat  Sweetbreads Goose Gravy  Heart Tongue Yeast, baker's and brewers   Commercial soups made with any of the foods listed in List II or List III  In addition avoid all alcoholic beverages 

## 2022-05-26 DIAGNOSIS — R69 Illness, unspecified: Secondary | ICD-10-CM | POA: Diagnosis not present

## 2022-05-29 DIAGNOSIS — H43813 Vitreous degeneration, bilateral: Secondary | ICD-10-CM | POA: Diagnosis not present

## 2022-05-29 DIAGNOSIS — H2513 Age-related nuclear cataract, bilateral: Secondary | ICD-10-CM | POA: Diagnosis not present

## 2022-05-29 DIAGNOSIS — H34832 Tributary (branch) retinal vein occlusion, left eye, with macular edema: Secondary | ICD-10-CM | POA: Diagnosis not present

## 2022-05-29 DIAGNOSIS — H35033 Hypertensive retinopathy, bilateral: Secondary | ICD-10-CM | POA: Diagnosis not present

## 2022-05-29 LAB — HM DIABETES EYE EXAM

## 2022-05-31 ENCOUNTER — Ambulatory Visit (INDEPENDENT_AMBULATORY_CARE_PROVIDER_SITE_OTHER): Payer: Medicare HMO

## 2022-05-31 VITALS — BP 130/72 | HR 68 | Temp 98.2°F | Ht 64.6 in | Wt 231.0 lb

## 2022-05-31 DIAGNOSIS — Z0000 Encounter for general adult medical examination without abnormal findings: Secondary | ICD-10-CM

## 2022-05-31 NOTE — Patient Instructions (Signed)
Robin Odom , Thank you for taking time to come for your Medicare Wellness Visit. I appreciate your ongoing commitment to your health goals. Please review the following plan we discussed and let me know if I can assist you in the future.   These are the goals we discussed:  Goals       Manage My Medicine      Timeframe:  Long-Range Goal Priority:  High Start Date:                             Expected End Date:                       Follow Up Date: 12/11/2021  In Progress:  - call for medicine refill 2 or 3 days before it runs out - call if I am sick and can't take my medicine - learn to read medicine labels - use a pillbox to sort medicine - use an alarm clock or phone to remind me to take my medicine    Why is this important?   These steps will help you keep on track with your medicines.        Patient Stated      05/13/2020, continue exercise regimen, lose 17 covid pounds. Play golf      Patient Stated      05/19/2021, lose weight      Patient Stated      05/31/2022, wants to lose 25 pounds in a year      Weight (lb) < 200 lb (90.7 kg) (pt-stated)      Wants to lose 20 pounds and exercise 5 days a week      Weight (lb) < 200 lb (90.7 kg)      04/16/2019, would like to lose 20 pounds gained during covid        This is a list of the screening recommended for you and due dates:  Health Maintenance  Topic Date Due   Eye exam for diabetics  11/11/2019   COVID-19 Vaccine (8 - 2023-24 season) 03/15/2022   Complete foot exam   06/16/2022   Hemoglobin A1C  08/16/2022   Yearly kidney function blood test for diabetes  02/15/2023   Yearly kidney health urinalysis for diabetes  02/15/2023   Colon Cancer Screening  02/18/2023   Medicare Annual Wellness Visit  06/01/2023   Mammogram  02/28/2024   DTaP/Tdap/Td vaccine (2 - Td or Tdap) 12/14/2030   Pneumonia Vaccine  Completed   Flu Shot  Completed   DEXA scan (bone density measurement)  Completed   Hepatitis C Screening: USPSTF  Recommendation to screen - Ages 23-79 yo.  Completed   Zoster (Shingles) Vaccine  Completed   HPV Vaccine  Aged Out    Advanced directives: Please bring a copy of your POA (Power of [place]) and/or Living Will to your next appointment.   Conditions/risks identified: none  Next appointment: Follow up in one year for your annual wellness visit    Preventive Care 65 Years and Older, Female Preventive care refers to lifestyle choices and visits with your health care provider that can promote health and wellness. What does preventive care include? A yearly physical exam. This is also called an annual well check. Dental exams once or twice a year. Routine eye exams. Ask your health care provider how often you should have your eyes checked. Personal lifestyle choices, including: Daily care of  your teeth and gums. Regular physical activity. Eating a healthy diet. Avoiding tobacco and drug use. Limiting alcohol use. Practicing safe sex. Taking low-dose aspirin every day. Taking vitamin and mineral supplements as recommended by your health care provider. What happens during an annual well check? The services and screenings done by your health care provider during your annual well check will depend on your age, overall health, lifestyle risk factors, and family history of disease. Counseling  Your health care provider may ask you questions about your: Alcohol use. Tobacco use. Drug use. Emotional well-being. Home and relationship well-being. Sexual activity. Eating habits. History of falls. Memory and ability to understand (cognition). Work and work Statistician. Reproductive health. Screening  You may have the following tests or measurements: Height, weight, and BMI. Blood pressure. Lipid and cholesterol levels. These may be checked every 5 years, or more frequently if you are over 82 years old. Skin check. Lung cancer screening. You may have this screening every year starting  at age 61 if you have a 30-pack-year history of smoking and currently smoke or have quit within the past 15 years. Fecal occult blood test (FOBT) of the stool. You may have this test every year starting at age 26. Flexible sigmoidoscopy or colonoscopy. You may have a sigmoidoscopy every 5 years or a colonoscopy every 10 years starting at age 30. Hepatitis C blood test. Hepatitis B blood test. Sexually transmitted disease (STD) testing. Diabetes screening. This is done by checking your blood sugar (glucose) after you have not eaten for a while (fasting). You may have this done every 1-3 years. Bone density scan. This is done to screen for osteoporosis. You may have this done starting at age 7. Mammogram. This may be done every 1-2 years. Talk to your health care provider about how often you should have regular mammograms. Talk with your health care provider about your test results, treatment options, and if necessary, the need for more tests. Vaccines  Your health care provider may recommend certain vaccines, such as: Influenza vaccine. This is recommended every year. Tetanus, diphtheria, and acellular pertussis (Tdap, Td) vaccine. You may need a Td booster every 10 years. Zoster vaccine. You may need this after age 20. Pneumococcal 13-valent conjugate (PCV13) vaccine. One dose is recommended after age 47. Pneumococcal polysaccharide (PPSV23) vaccine. One dose is recommended after age 33. Talk to your health care provider about which screenings and vaccines you need and how often you need them. This information is not intended to replace advice given to you by your health care provider. Make sure you discuss any questions you have with your health care provider. Document Released: 05/14/2015 Document Revised: 01/05/2016 Document Reviewed: 02/16/2015 Elsevier Interactive Patient Education  2017 [place] Prevention in the Home Falls can cause injuries. They can happen to people of  all ages. There are many things you can do to make your home safe and to help prevent falls. What can I do on the outside of my home? Regularly fix the edges of walkways and driveways and fix any cracks. Remove anything that might make you trip as you walk through a door, such as a raised step or threshold. Trim any bushes or trees on the path to your home. Use bright outdoor lighting. Clear any walking paths of anything that might make someone trip, such as rocks or tools. Regularly check to see if handrails are loose or broken. Make sure that both sides of any steps have handrails. Any raised decks  and porches should have guardrails on the edges. Have any leaves, snow, or ice cleared regularly. Use sand or salt on walking paths during winter. Clean up any spills in your garage right away. This includes oil or grease spills. What can I do in the bathroom? Use night lights. Install grab bars by the toilet and in the tub and shower. Do not use towel bars as grab bars. Use non-skid mats or decals in the tub or shower. If you need to sit down in the shower, use a plastic, non-slip stool. Keep the floor dry. Clean up any water that spills on the floor as soon as it happens. Remove soap buildup in the tub or shower regularly. Attach bath mats securely with double-sided non-slip rug tape. Do not have throw rugs and other things on the floor that can make you trip. What can I do in the bedroom? Use night lights. Make sure that you have a light by your bed that is easy to reach. Do not use any sheets or blankets that are too big for your bed. They should not hang down onto the floor. Have a firm chair that has side arms. You can use this for support while you get dressed. Do not have throw rugs and other things on the floor that can make you trip. What can I do in the kitchen? Clean up any spills right away. Avoid walking on wet floors. Keep items that you use a lot in easy-to-reach places. If  you need to reach something above you, use a strong step stool that has a grab bar. Keep electrical cords out of the way. Do not use floor polish or wax that makes floors slippery. If you must use wax, use non-skid floor wax. Do not have throw rugs and other things on the floor that can make you trip. What can I do with my stairs? Do not leave any items on the stairs. Make sure that there are handrails on both sides of the stairs and use them. Fix handrails that are broken or loose. Make sure that handrails are as long as the stairways. Check any carpeting to make sure that it is firmly attached to the stairs. Fix any carpet that is loose or worn. Avoid having throw rugs at the top or bottom of the stairs. If you do have throw rugs, attach them to the floor with carpet tape. Make sure that you have a light switch at the top of the stairs and the bottom of the stairs. If you do not have them, ask someone to add them for you. What else can I do to help prevent falls? Wear shoes that: Do not have high heels. Have rubber bottoms. Are comfortable and fit you well. Are closed at the toe. Do not wear sandals. If you use a stepladder: Make sure that it is fully opened. Do not climb a closed stepladder. Make sure that both sides of the stepladder are locked into place. Ask someone to hold it for you, if possible. Clearly mark and make sure that you can see: Any grab bars or handrails. First and last steps. Where the edge of each step is. Use tools that help you move around (mobility aids) if they are needed. These include: Canes. Walkers. Scooters. Crutches. Turn on the lights when you go into a dark area. Replace any light bulbs as soon as they burn out. Set up your furniture so you have a clear path. Avoid moving your furniture around. If  any of your floors are uneven, fix them. If there are any pets around you, be aware of where they are. Review your medicines with your doctor. Some  medicines can make you feel dizzy. This can increase your chance of falling. Ask your doctor what other things that you can do to help prevent falls. This information is not intended to replace advice given to you by your health care provider. Make sure you discuss any questions you have with your health care provider. Document Released: 02/11/2009 Document Revised: 09/23/2015 Document Reviewed: 05/22/2014 Elsevier Interactive Patient Education  2017 [employer].

## 2022-05-31 NOTE — Progress Notes (Signed)
Subjective:   Robin Odom is a 72 y.o. female who presents for Medicare Annual (Subsequent) preventive examination.  Review of Systems     Cardiac Risk Factors include: advanced age (>44mn, >>18women);hypertension;obesity (BMI >30kg/m2)     Objective:    Today's Vitals   05/31/22 1055 05/31/22 1102  BP: 130/72   Pulse: 68   Temp: 98.2 F (36.8 C)   TempSrc: Oral   SpO2: 93%   Weight: 231 lb (104.8 kg)   Height: 5' 4.6" (1.641 m)   PainSc:  5    Body mass index is 38.92 kg/m.     05/31/2022   11:08 AM 05/19/2021   11:07 AM 10/05/2020   10:01 AM 05/13/2020   10:28 AM 04/16/2019   10:15 AM 04/04/2018    3:04 PM  Advanced Directives  Does Patient Have a Medical Advance Directive? Yes Yes Yes Yes Yes Yes  Type of AParamedicof AVin[place]Living will [place]Living will [place]Living will HRoaring SpringLiving will HNew StraitsvilleLiving will [place]avano ParkLiving will  Does patient want to make changes to medical advance directive?   No - Patient declined   No - Patient declined  Copy of HNationalin Chart? No - copy requested No - copy requested No - copy requested No - copy requested No - copy requested No - copy requested    Current Medications (verified) Outpatient Encounter Medications as of 05/31/2022  Medication Sig   allopurinol (ZYLOPRIM) 300 MG tablet TAKE 1 TABLET BY MOUTH EVERY DAY   amLODipine (NORVASC) 10 MG tablet TAKE 1 TABLET BY MOUTH EVERY DAY   Ascorbic Acid (VITAMIN C PO) Take 1,500 mg by mouth daily at 6 (six) AM.   atorvastatin (LIPITOR) 20 MG tablet Take 1 tablet (20 mg total) by mouth daily.   B Complex Vitamins (VITAMIN-B COMPLEX) TABS Take by mouth.   BENFOTIAMINE PO Take by mouth 2 (two) times daily.   Calcium Carb-Cholecalciferol 500-10 MG-MCG TABS Take 1 tablet by mouth daily.   carvedilol (COREG) 6.25 MG tablet Take 1  tablet (6.25 mg total) by mouth 2 (two) times daily.   Cholecalciferol (VITAMIN D3) 125 MCG (5000 UT) CAPS Take by mouth.    colchicine 0.6 MG tablet Take 0.6 mg by mouth as needed.   Cyanocobalamin (B-12) 5000 MCG CAPS Take 1 capsule by mouth daily at 12 noon.   furosemide (LASIX) 20 MG tablet TAKE 1 TABLET BY MOUTH EVERY DAY   magnesium oxide (MAG-OX) 400 MG tablet Take 400 mg by mouth daily.   olmesartan (BENICAR) 40 MG tablet TAKE 1 TABLET BY MOUTH EVERY DAY   Omega-3 Fatty Acids (OMEGA 3 PO) Take 2,500 mg by mouth daily. 2 tablets by mouth daily   potassium chloride (MICRO-K) 10 MEQ CR capsule TAKE 1 CAPSULE BY MOUTH EVERY DAY   RYBELSUS 3 MG TABS    triamcinolone cream (KENALOG) 0.1 % APPLY TO AFFECTED AREA TWICE DAILY AS NEEDED   TURMERIC CURCUMIN PO Take by mouth daily.   melatonin 3 MG TABS tablet Take 3 mg by mouth at bedtime. (Patient not taking: Reported on 05/16/2022)   No facility-administered encounter medications on file as of 05/31/2022.    Allergies (verified) Benadryl [diphenhydramine], Codeine, and Penicillins   History: Past Medical History:  Diagnosis Date   Arthritis    Diabetes mellitus without complication (HCC)    Gout    Hypertension  Partial retinal tear of both eyes without detachment 08/29/2017   Past Surgical History:  Procedure Laterality Date   ABDOMINAL HYSTERECTOMY  1991   CARPAL TUNNEL RELEASE Right 2019   COLONOSCOPY     5 between 1994-2010   Left knee surgery  2006   ROTATOR CUFF REPAIR Left 2001   ROTATOR CUFF REPAIR Right 2003   SHOULDER SURGERY  2001/2003   TONSILECTOMY/ADENOIDECTOMY WITH MYRINGOTOMY  1971   Family History  Problem Relation Age of Onset   Heart disease Mother    Hypertension Mother    Diabetes Mother    Arthritis Father    Hypertension Father    Prostate cancer Father    Parkinson's disease Father    Heart disease Father    Sinusitis Sister    Allergic rhinitis Sister    Diabetes Sister    High Cholesterol  Sister    Hypertension Sister    Diabetes Brother    Hypertension Brother    High Cholesterol Brother    Hypertension Brother    Angioedema Neg Hx    Asthma Neg Hx    Eczema Neg Hx    Immunodeficiency Neg Hx    Urticaria Neg Hx    Breast cancer Neg Hx    Social History   Socioeconomic History   Marital status: Single    Spouse name: Not on file   Number of children: Not on file   Years of education: Not on file   Highest education level: Not on file  Occupational History   Occupation: retired  Tobacco Use   Smoking status: Never    Passive exposure: Never   Smokeless tobacco: Never  Vaping Use   Vaping Use: Never used  Substance and Sexual Activity   Alcohol use: Yes    Comment: rarely   Drug use: No   Sexual activity: Not Currently    Birth control/protection: None    Comment: Hysterectomy  Other Topics Concern   Not on file  Social History Narrative   Not on file   Social Determinants of Health   Financial Resource Strain: Low Risk  (05/31/2022)   Overall Financial Resource Strain (CARDIA)    Difficulty of Paying Living Expenses: Not hard at all  Food Insecurity: No Food Insecurity (05/31/2022)   Hunger Vital Sign    Worried About Running Out of Food in the Last Year: Never true    Ran Out of Food in the Last Year: Never true  Transportation Needs: No Transportation Needs (05/31/2022)   PRAPARE - Hydrologist (Medical): No    Lack of Transportation (Non-Medical): No  Physical Activity: Sufficiently Active (05/31/2022)   Exercise Vital Sign    Days of Exercise per Week: 5 days    Minutes of Exercise per Session: 30 min  Stress: No Stress Concern Present (05/31/2022)   [place]    Feeling of Stress : Not at all  Social Connections: Not on file    Tobacco Counseling Counseling given: Not Answered   Clinical Intake:  Pre-visit preparation completed:  Yes  Pain : 0-10 Pain Score: 5  Pain Type: Chronic pain Pain Location: Arm Pain Orientation: Left Pain Descriptors / Indicators: Aching Pain Onset: More than a month ago Pain Frequency: Constant     Nutritional Status: BMI > 30  Obese Nutritional Risks: None Diabetes: No  How often do you need to have someone help you when you read instructions,  pamphlets, or other written materials from your doctor or pharmacy?: 1 - Never  Diabetic? no  Interpreter Needed?: No  Information entered by :: NAllen LPN   Activities of Daily Living    05/31/2022   11:09 AM  In your present state of health, do you have any difficulty performing the following activities:  Hearing? 0  Vision? 0  Difficulty concentrating or making decisions? 0  Walking or climbing stairs? 0  Dressing or bathing? 0  Doing errands, shopping? 0  Preparing Food and eating ? N  Using the Toilet? N  In the past six months, have you accidently leaked urine? N  Do you have problems with loss of bowel control? N  Managing your Medications? N  Managing your Finances? N  Housekeeping or managing your Housekeeping? N    Patient Care Team: Glendale Chard, MD as PCP - General (Internal Medicine) Buford Dresser, MD as PCP - Cardiology (Cardiology) Mayford Knife, [facility] (Pharmacist)  Indicate any recent Medical Services you may have received from other than [facility] providers in the past year (date may be approximate).     Assessment:   This is a routine wellness examination for Robin Odom.  Hearing/Vision screen Vision Screening - Comments:: Regular eye exams, [facility], Dr. Roderic Palau  Dietary issues and exercise activities discussed: Current Exercise Habits: Home exercise routine, Type of exercise: Other - see comments (stationary bike), Time (Minutes): 30, Frequency (Times/Week): 5, Weekly Exercise (Minutes/Week): 150   Goals Addressed             This Visit's Progress    Patient Stated        05/31/2022, wants to lose 25 pounds in a year       Depression Screen    05/31/2022   11:09 AM 05/19/2021   11:08 AM 05/13/2020   10:29 AM 04/16/2019   10:15 AM 07/11/2018    3:43 PM 05/14/2018    3:49 PM 04/10/2018    8:51 AM  PHQ 2/9 Scores  PHQ - 2 Score 0 0 0 0 0 0 0  PHQ- 9 Score    3       Fall Risk    05/31/2022   11:09 AM 05/19/2021   11:08 AM 05/18/2021    7:32 PM 05/13/2020   10:29 AM 04/16/2019   10:15 AM  Fall Risk   Falls in the past year? 0 0 0 0 0  Number falls in past yr: 0  0    Injury with Fall? 0  0    Risk for fall due to : Medication side effect Medication side effect  Medication side effect Medication side effect  Follow up Falls prevention discussed;Education provided;Falls evaluation completed Falls evaluation completed;Education provided;Falls prevention discussed  Falls evaluation completed;Education provided;Falls prevention discussed Education provided;Falls evaluation completed;Falls prevention discussed    FALL RISK PREVENTION PERTAINING TO THE HOME:  Any stairs in or around the home? Yes  If so, are there any without handrails? No  Home free of loose throw rugs in walkways, pet beds, electrical cords, etc? Yes  Adequate lighting in your home to reduce risk of falls? Yes   ASSISTIVE DEVICES UTILIZED TO PREVENT FALLS:  Life alert? No  Use of a cane, walker or w/c? No  Grab bars in the bathroom? No  Shower chair or bench in shower? No  Elevated toilet seat or a handicapped toilet? Yes   TIMED UP AND GO:  Was the test performed? Yes .  Length of time  to ambulate 10 feet: 5 sec.   Gait steady and fast without use of assistive device  Cognitive Function:        05/31/2022   11:10 AM 05/19/2021   11:09 AM 05/13/2020   10:30 AM 04/16/2019   10:18 AM 04/04/2018    3:11 PM  6CIT Screen  What Year? 0 points 0 points 0 points 0 points 0 points  What month? 0 points 0 points 0 points 0 points 0 points  What time? 0 points 0 points 0 points 0  points 0 points  Count back from 20 0 points 0 points 0 points 0 points 0 points  Months in reverse 0 points 0 points 0 points 0 points 0 points  Repeat phrase 2 points 0 points 0 points 0 points 0 points  Total Score 2 points 0 points 0 points 0 points 0 points    Immunizations Immunization History  Administered Date(s) Administered   Influenza, High Dose Seasonal PF 02/05/2017, 02/12/2018, 12/16/2018, 01/13/2020, 01/18/2021   Influenza,inj,quad, With Preservative 02/13/2017, 02/01/2018   Influenza-Unspecified 02/03/2015, 01/18/2018, 01/29/2018, 12/16/2018, 01/18/2022   PFIZER(Purple Top)SARS-COV-2 Vaccination 01/01/2019, 01/23/2019, 08/19/2019, 08/13/2020   Pfizer Covid-19 Vaccine Bivalent Booster 67yr & up 04/03/2021, 01/18/2022   Pneumococcal Conjugate-13 01/20/2014   Pneumococcal Polysaccharide-23 10/09/2016   Respiratory Syncytial Virus Vaccine,Recomb Aduvanted(Arexvy) 05/23/2022   Rsv, Bivalent, Protein Subunit Rsvpref,pf (Evans Lance 05/23/2022   Tdap 12/13/2020   Unspecified SARS-COV-2 Vaccination 08/20/2019   Zoster Recombinat (Shingrix) 10/18/2019, 03/05/2020    TDAP status: Up to date  Flu Vaccine status: Up to date  Pneumococcal vaccine status: Up to date  Covid-19 vaccine status: Completed vaccines  Qualifies for Shingles Vaccine? Yes   Zostavax completed Yes   Shingrix Completed?: Yes  Screening Tests Health Maintenance  Topic Date Due   OPHTHALMOLOGY EXAM  11/11/2019   COVID-19 Vaccine (8 - 2023-24 season) 03/15/2022   FOOT EXAM  06/16/2022   HEMOGLOBIN A1C  08/16/2022   Diabetic kidney evaluation - eGFR measurement  02/15/2023   Diabetic kidney evaluation - Urine ACR  02/15/2023   COLONOSCOPY (Pts 45-45yrInsurance coverage will need to be confirmed)  02/18/2023   Medicare Annual Wellness (AWV)  06/01/2023   MAMMOGRAM  02/28/2024   DTaP/Tdap/Td (2 - Td or Tdap) 12/14/2030   Pneumonia Vaccine 6548Years old  Completed   INFLUENZA VACCINE  Completed    DEXA SCAN  Completed   Hepatitis C Screening  Completed   Zoster Vaccines- Shingrix  Completed   HPV VACCINES  Aged Out    Health Maintenance  Health Maintenance Due  Topic Date Due   OPHTHALMOLOGY EXAM  11/11/2019   COVID-19 Vaccine (8 - 2023-24 season) 03/15/2022    Colorectal cancer screening: Type of screening: Colonoscopy. Completed 02/18/2020. Repeat every 3 years  Mammogram status: Completed 02/27/2022. Repeat every year  Bone Density status: Completed 11/24/2021.  Lung Cancer Screening: (Low Dose CT Chest recommended if Age 11067-80ears, 30 pack-year currently smoking OR have quit w/in 15years.) does not qualify.   Lung Cancer Screening Referral: no  Additional Screening:  Hepatitis C Screening: does qualify; Completed 10/10/2012  Vision Screening: Recommended annual ophthalmology exams for early detection of glaucoma and other disorders of the eye. Is the patient up to date with their annual eye exam?  Yes  Who is the provider or what is the name of the office in which the patient attends annual eye exams? [place] If pt is not established with a provider, would they like to be referred  to a provider to establish care? No .   Dental Screening: Recommended annual dental exams for proper oral hygiene  Community Resource Referral / Chronic Care Management: CRR required this visit?  No   CCM required this visit?  No      Plan:     I have personally reviewed and noted the following in the patient's chart:   Medical and social history Use of alcohol, tobacco or illicit drugs  Current medications and supplements including opioid prescriptions. Patient is not currently taking opioid prescriptions. Functional ability and status Nutritional status Physical activity Advanced directives List of other physicians Hospitalizations, surgeries, and ER visits in previous 12 months Vitals Screenings to include cognitive, depression, and falls Referrals and  appointments  In addition, I have reviewed and discussed with patient certain preventive protocols, quality metrics, and best practice recommendations. A written personalized care plan for preventive services as well as general preventive health recommendations were provided to patient.     Kellie Simmering, LPN   1/76/[id number]   Nurse Notes: ganglion cyst on both hands seen orthopedist

## 2022-06-07 DIAGNOSIS — M25512 Pain in left shoulder: Secondary | ICD-10-CM | POA: Diagnosis not present

## 2022-06-07 DIAGNOSIS — M4722 Other spondylosis with radiculopathy, cervical region: Secondary | ICD-10-CM | POA: Diagnosis not present

## 2022-06-16 ENCOUNTER — Other Ambulatory Visit: Payer: Self-pay | Admitting: Physician Assistant

## 2022-06-16 NOTE — Telephone Encounter (Signed)
Next Visit: 11/14/2022  Last Visit: 05/16/2022  Last Fill: 02/28/2022  DX:  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 tophus   Current Dose per office note 05/16/2022: Allopurinol 300 mg 1 tablet by mouth daily.   Labs:  02/14/2022 CMP WNL, Uric Acid 3.9, CBC 11/15/2021 WNL   Okay to refill allopurinol?

## 2022-06-19 DIAGNOSIS — Z9889 Other specified postprocedural states: Secondary | ICD-10-CM | POA: Diagnosis not present

## 2022-06-19 DIAGNOSIS — R29898 Other symptoms and signs involving the musculoskeletal system: Secondary | ICD-10-CM | POA: Diagnosis not present

## 2022-06-19 DIAGNOSIS — M25519 Pain in unspecified shoulder: Secondary | ICD-10-CM | POA: Diagnosis not present

## 2022-06-19 DIAGNOSIS — M25512 Pain in left shoulder: Secondary | ICD-10-CM | POA: Diagnosis not present

## 2022-06-19 DIAGNOSIS — M5412 Radiculopathy, cervical region: Secondary | ICD-10-CM | POA: Diagnosis not present

## 2022-06-20 ENCOUNTER — Ambulatory Visit: Payer: Medicare HMO | Admitting: Internal Medicine

## 2022-06-20 ENCOUNTER — Encounter: Payer: Self-pay | Admitting: Neurology

## 2022-06-21 ENCOUNTER — Encounter: Payer: Self-pay | Admitting: Neurology

## 2022-06-21 ENCOUNTER — Ambulatory Visit (INDEPENDENT_AMBULATORY_CARE_PROVIDER_SITE_OTHER): Payer: Medicare HMO | Admitting: Neurology

## 2022-06-21 VITALS — BP 143/84 | HR 72 | Ht 65.5 in | Wt 233.0 lb

## 2022-06-21 DIAGNOSIS — M1A09X0 Idiopathic chronic gout, multiple sites, without tophus (tophi): Secondary | ICD-10-CM

## 2022-06-21 DIAGNOSIS — M47812 Spondylosis without myelopathy or radiculopathy, cervical region: Secondary | ICD-10-CM

## 2022-06-21 NOTE — Progress Notes (Signed)
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 She a right-handed AA female with a known OSA sleep disorder.  Robin Odom is a 72 y.o. female patient who is seen upon referral on 06/21/2022 from Dr Baird Cancer  for a new CPAP machine . She was last seen at [facility] for carpal tunnel in 2018 by Dr Jaynee Eagles. .  Chief concern according to patient :  Pt alone, rm 2, [name] Last SS's were in 2015 (doesn't have a copy of report and not in epic). She has been on CPAP since 1994. DME respicare and the current machine was set up 04/22/2014 in [place], [name] , [name] . The machine is displaying a message that it is at the end of its life. She is planning to travel next week and would like a fast replacement.   She has risk factors : BMI is 38, also listed are pre-diabetes and hypertension, high cholesterol, and  Osteoarthritis. She had lost weight and as for w a while not needing CPAP but gained it back.  She used to travel for her job for 25 years and ate out- she was now finally able to prepare her own meals and is losing weight again, pre COVID weight 285 - got down to 225, and now again at 233.    In July - September 2023 she had insomnia, not sure what precipitated this. Not taking sleep aid.       The patient had the first sleep study in the year 1994 in [place]  with a result of OSA.  Sleep relevant medical history:No  Nocturia, , Snoring ( still while using CPAP ) and witnessed apnea in 1994 -  had a Tonsillectomy 1971, wisdom  teeth removed 1980, cervical spine DDD,  dry mouth. Rare GERD. Dust allergies.   Family medical /sleep history: sister and brother are  other biological family members on CPAP with OSA.  Social history:  Patient is retired from [employer] at [employer]- and lives in a household alone. Family status is single. Pets are not  present. Tobacco use-none .  ETOH use rarely, Caffeine intake in form of Coffee( 1-2) Soda( /) Tea ( /) or energy drinks Exercise in form of personal trainer, water aerobics. .   Hobbies :cooking , volunteering.       Sleep habits are as follows: The patient's dinner time is between 5-9 PM. The patient goes to bed at 10-11 PM and continues to sleep for 6-7 hours, wakes for one bathroom breaks, the first time at 3 AM.   The preferred sleep position is left side  , with the support of 1-2 pillows.  Dreams are reportedly rare.  The patient wakes up spontaneously. 6-7  AM is the usual rise time. She reports s usually  feeling refreshed or restored in AM, while on CPAP-  with  symptoms such as dry mouth, no morning headaches, and late morning residual fatigue.  She attributes this fatigue more to food.  Naps are taken infrequently," I don't have time to nap".   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 female with a hx of hypertension, asthma who is seen for follow up today. I initially met her 08/02/20 as a new consult at the request of Glendale Chard, MD for the evaluation and management of dyspnea on exertion.   CV risk: hypertension, hyperlipidemia, family history of heart disease. OSA on CPAP.  CV history: Treadmill stress with hypertensive response to exercise; echo also with diastolic dysfunction and mildly elevated filling pressure based on tissue doppler. LVEF normal, no significant valve disease.   At her last appointment she noted episodes of bilateral ankle edema up to 4 days which she attributed to amlodipine. At home, her blood pressure had been more elevated in  the morning, and generally higher as well due to increased stress. She was infected with COVID in early October 2022. Since then she complained of a constant brain fog. Her metoprolol was switched to carvedilol and her hydralazine was stopped.   Review of Systems: Out of a complete 14 system review, the patient complains of only the following symptoms, and all other reviewed systems are negative.:  Fatigue, sleepiness , snoring on CPAP ?  Air leaks.   How likely are you to doze in the following situations: 0 = not likely, 1 = slight chance, 2 = moderate chance, 3 = high chance   Sitting and Reading? Watching Television? Sitting inactive in a public place (theater or meeting)? As a passenger in a car for an hour without a break? Lying down in the afternoon when circumstances permit? Sitting and talking to someone? Sitting quietly after lunch without alcohol? In a car, while stopped for a few minutes in traffic?   Total = 8/ 24 points   FSS endorsed at 18/ 63 points.   GDS 1/ 15   Social History   Socioeconomic History   Marital status: Single    Spouse name: Not on file   Number of children: Not on file   Years of education: Not on file   Highest education level: Not on file  Occupational History   Occupation: [name]   Tobacco Use   Smoking status: Never    Passive exposure: Never   Smokeless tobacco: Never  Vaping Use   Vaping Use: Never used  Substance and Sexual Activity   Alcohol use: Yes    Comment: rarely   Drug use: No   Sexual activity: Not Currently    Birth control/protection: None    Comment: Hysterectomy  Other Topics Concern   Not on file  Social History Narrative   Not on file   Social Determinants of Health   Financial Resource Strain: Low Risk  (05/31/2022)   Overall Financial Resource Strain (CARDIA)    Difficulty of Paying Living Expenses: Not hard at all  Food Insecurity: No Food Insecurity (05/31/2022)   Hunger Vital Sign    Worried About Running  Out of Food in the Last Year: Never true    [place] in the Last Year: Never true  Transportation Needs: No Transportation Needs (05/31/2022)   PRAPARE - Hydrologist (Medical): No    Lack of Transportation (Non-Medical): No  Physical Activity: Sufficiently Active (05/31/2022)   Exercise Vital Sign    Days of Exercise  per Week: 5 days    Minutes of Exercise per Session: 30 min  Stress: No Stress Concern Present (05/31/2022)   [place]    Feeling of Stress : Not at all  Social Connections: Not on file    Family History  Problem Relation Age of Onset   Heart disease Mother    Hypertension Mother    Diabetes Mother    Arthritis Father    Hypertension Father    Prostate cancer Father    Parkinson's disease Father    Heart disease Father    Sinusitis Sister    Allergic rhinitis Sister    Diabetes Sister    High Cholesterol Sister    Hypertension Sister    Diabetes Brother    Hypertension Brother    High Cholesterol Brother    Hypertension Brother    Angioedema Neg Hx    Asthma Neg Hx    Eczema Neg Hx    Immunodeficiency Neg Hx    Urticaria Neg Hx    Breast cancer Neg Hx     Past Medical History:  Diagnosis Date   Arthritis    Diabetes mellitus without complication ([place])    Gout    Hypertension    Partial retinal tear of both eyes without detachment 08/29/2017    Past Surgical History:  Procedure Laterality Date   ABDOMINAL HYSTERECTOMY  1991   CARPAL TUNNEL RELEASE Right 2019   COLONOSCOPY     5 between 1994-2010   Left knee surgery  2006   ROTATOR CUFF REPAIR Left 2001   ROTATOR CUFF REPAIR Right 2003   SHOULDER SURGERY  2001/2003   TONSILECTOMY/ADENOIDECTOMY WITH MYRINGOTOMY  1971     Current Outpatient Medications on File Prior to Visit  Medication Sig Dispense Refill   allopurinol (ZYLOPRIM) 300 MG tablet TAKE 1 TABLET BY MOUTH EVERY DAY 90 tablet 0    amLODipine (NORVASC) 10 MG tablet TAKE 1 TABLET BY MOUTH EVERY DAY 90 tablet 1   Ascorbic Acid (VITAMIN C PO) Take 1,500 mg by mouth daily at 6 (six) AM.     atorvastatin (LIPITOR) 20 MG tablet Take 1 tablet (20 mg total) by mouth daily. 30 tablet 11   B Complex Vitamins (VITAMIN-B COMPLEX) TABS Take by mouth.     BENFOTIAMINE PO Take by mouth 2 (two) times daily.     Calcium Carb-Cholecalciferol 500-10 MG-MCG TABS Take 1 tablet by mouth daily.     carvedilol (COREG) 6.25 MG tablet Take 1 tablet (6.25 mg total) by mouth 2 (two) times daily. 180 tablet 3   Cholecalciferol (VITAMIN D3) 125 MCG (5000 UT) CAPS Take by mouth.      colchicine 0.6 MG tablet Take 0.6 mg by mouth as needed.     Cyanocobalamin (B-12) 5000 MCG CAPS Take 1 capsule by mouth daily at 12 noon.     furosemide (LASIX) 20 MG tablet TAKE 1 TABLET BY MOUTH EVERY DAY 90 tablet 1   magnesium oxide (MAG-OX) 400 MG tablet Take 400 mg by mouth daily.     melatonin 3 MG TABS tablet Take 3 mg by mouth at bedtime.     olmesartan (BENICAR) 40 MG tablet TAKE 1 TABLET BY MOUTH EVERY DAY 90 tablet 2   Omega-3 Fatty Acids (OMEGA 3 PO) Take 2,500 mg by mouth daily. 2 tablets by mouth daily     potassium chloride (MICRO-K) 10 MEQ CR capsule TAKE 1 CAPSULE BY MOUTH EVERY DAY  90 capsule 1   RYBELSUS 3 MG TABS      triamcinolone cream (KENALOG) 0.1 % APPLY TO AFFECTED AREA TWICE DAILY AS NEEDED 45 g 0   TURMERIC CURCUMIN PO Take by mouth daily.     No current facility-administered medications on file prior to visit.    Allergies  Allergen Reactions   Benadryl [Diphenhydramine] Other (See Comments)    Temporary Amnesia    Codeine Other (See Comments)    Hallucinations   Penicillins Hives     DIAGNOSTIC DATA (LABS, IMAGING, TESTING) - I reviewed patient records, labs, notes, testing and imaging myself where available.  Lab Results  Component Value Date   WBC 7.2 11/15/2021   HGB 13.1 11/15/2021   HCT 39.5 11/15/2021   MCV 81.4  11/15/2021   PLT 302 11/15/2021      Component Value Date/Time   NA 144 02/14/2022 1209   K 4.0 02/14/2022 1209   CL 105 02/14/2022 1209   CO2 26 02/14/2022 1209   GLUCOSE 99 02/14/2022 1209   GLUCOSE 91 11/15/2021 1108   BUN 12 02/14/2022 1209   CREATININE 0.71 02/14/2022 1209   CREATININE 0.75 11/15/2021 1108   CALCIUM 9.7 02/14/2022 1209   PROT 7.0 02/14/2022 1209   ALBUMIN 4.2 02/14/2022 1209   AST 19 02/14/2022 1209   ALT 10 02/14/2022 1209   ALKPHOS 77 02/14/2022 1209   BILITOT 0.5 02/14/2022 1209   GFRNONAA 67 05/13/2020 1414   GFRNONAA 67 02/04/2020 1505   GFRAA 77 05/13/2020 1414   GFRAA 78 02/04/2020 1505   Lab Results  Component Value Date   CHOL 177 02/14/2022   HDL 60 02/14/2022   LDLCALC 107 (H) 02/14/2022   TRIG 49 02/14/2022   CHOLHDL 3.0 02/14/2022   Lab Results  Component Value Date   HGBA1C 5.8 (H) 02/14/2022   No results found for: "VITAMINB12" No results found for: "TSH"  PHYSICAL EXAM:  Today's Vitals   06/21/22 1001  BP: (!) 143/84  Pulse: 72  Weight: 233 lb (105.7 kg)  Height: 5' 5.5" (1.664 m)   Body mass index is 38.18 kg/m.   Wt Readings from Last 3 Encounters:  06/21/22 233 lb (105.7 kg)  05/31/22 231 lb (104.8 kg)  05/16/22 230 lb (104.3 kg)     Ht Readings from Last 3 Encounters:  06/21/22 5' 5.5" (1.664 m)  05/31/22 5' 4.6" (1.641 m)  05/16/22 5' 4"$  (1.626 m)      General: The patient is awake, alert and appears not in acute distress. The patient is well groomed. Head: Normocephalic, atraumatic. Neck is supple.  Mallampati 3,  neck circumference:15.5 inches.  Nasal airflow barely patent today.  Retrognathia is not seen.  Dental status: biological  Cardiovascular:  Regular rate and cardiac rhythm by pulse,  without distended neck veins. Respiratory: Lungs are clear to auscultation.  Skin:  With evidence of ankle edema. In support stockings. . Trunk: The patient's posture is erect.   NEUROLOGIC EXAM: The patient  is awake and alert, oriented to place and time.   Memory subjective described as intact.  Attention span & concentration ability appears normal.  Speech is fluent,  without  dysarthria, dysphonia or aphasia.  Mood and affect are appropriate.   Cranial nerves: no loss of smell or taste reported  Pupils are equal and briskly reactive to light. Funduscopic exam : deferred. .  Extraocular movements in vertical and horizontal planes were intact and without nystagmus. No Diplopia. Visual fields by  finger perimetry are intact. Hearing was intact to y tuning fork   Facial sensation intact to fine touch.  Facial motor strength is symmetric and tongue and uvula move midline.  Neck ROM : rotation, tilt and flexion extension were normal for age and shoulder shrug was symmetrical.    Motor exam:  Symmetric bulk, tone and ROM.   Normal tone without cog wheeling, symmetric grip strength .   Sensory:  Fine touch, pinprick and vibration were tested  and  normal.  Proprioception tested in the upper extremities was normal.   Coordination: Rapid alternating movements in the fingers/hands were of normal speed.  The Finger-to-nose maneuver was intact without evidence of ataxia, dysmetria or tremor.   Gait and station: Patient could rise unassisted from a seated position, walked without assistive device. ( O per RN observation )   Stance is of normal width/ base and the patient turned with 3 steps.  Toe and heel walk were deferred.  Deep tendon reflexes: in the upper and lower extremities are symmetric and intact.  Babinski response was deferred.      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  Dr. Lyda Kalata machine is a respite care [employer] machine the so-called air sense 10 AutoSet and was set up Christmas 2015.  She is 100% compliant user by days and 5 hours with an  average of 6 hours 40 minutes nocturnal use time.  This machine is set between 6 and 15 cm water without expiratory relief pressure.  The residual AHI is 1.3/h.  There are no central apneas emerging.  91st percentile pressure is 15 cm water.  Air leakage at the 95th percentile is 36 L/min which does mean that there is a lot of air leakage. nasal cradle .   2) OSA risk factors are weight, BMI over 38 today. Cause for residual AHI and snoring are a poor mask fit or a worn down mask and head gear   3) dry mouth : humidifier reservoir is always empty , reset humidity.    Since I have not has not been a baseline sleep study in a while for this compliant CPAP user I would like to obtain a home sleep test. At this time I do not think that Dr. Wanita Chamberlain needs an attended sleep study or a retitration.  This baseline is needed to get the best possible settings on an replacement auto titrating machine which will again be from [employer]. If the patient continues to prefer a nasal pillow or nasal cradle interface I will replace that as well.  Home sleep test currently booked out by about 3 weeks I will get her on the list and even on the cancellation list.  Will have to first obtain preauthorization through [place] and this protects the patient from having surprise costs.  I wish her very well and her weight loss goal and a change of 10% body weight usually justifies a retitration or resetting of a CPAP device.   She is having a active lifestyle, as she volunteers and she likes to travel so if her machine is not travel friendly enough for her she could purchase a travel machine and I will set the device pressure for a travel CPAP at the 95th percentile pressure which would currently be 15 cm water.  I plan to follow up either personally or through our NP within 3-4 months.   I would like to thank Glendale Chard, MD and Glendale Chard, [address] [place] [place] [address],  [place] [address] for allowing me to meet  with and to take care of this pleasant patient.   CC: I will share my notes with PCP.  After spending a total time of  45  minutes face to face and additional time for physical and neurologic examination, review of laboratory studies,  personal review of imaging studies, reports and results of other testing and review of referral information / records as far as provided in visit,   Electronically signed by: Larey Seat, MD 06/21/2022 10:28 AM  [facility] Board certified by The [employer] of Sleep Medicine and Diplomate of the [employer] of Sleep Medicine. Board certified In Neurology through the [place], Fellow of the [employer] of Neurology. Medical Director of [facility].

## 2022-06-21 NOTE — Patient Instructions (Signed)
HST was ordered to get a new apnea baseline. Your weight has changed and is still in flux, you may need a different autotitration CPAP pressure range from 9 years ago.    CPAP and BIPAP Information CPAP and BIPAP are methods that use air pressure to keep your airways open and to help you breathe well. CPAP and BIPAP use different amounts of pressure. Your health care provider will tell you whether CPAP or BIPAP would be more helpful for you. CPAP stands for "continuous positive airway pressure." With CPAP, the amount of pressure stays the same while you breathe in (inhale) and out (exhale). BIPAP stands for "bi-level positive airway pressure." With BIPAP, the amount of pressure will be higher when you inhale and lower when you exhale. This allows you to take larger breaths. CPAP or BIPAP may be used in the hospital, or your health care provider may want you to use it at home. You may need to have a sleep study before your health care provider can order a machine for you to use at home. What are the advantages? CPAP or BIPAP can be helpful if you have: Sleep apnea. Chronic obstructive pulmonary disease (COPD). Heart failure. Medical conditions that cause muscle weakness, including muscular dystrophy or amyotrophic lateral sclerosis (ALS). Other problems that cause breathing to be shallow, weak, abnormal, or difficult. CPAP and BIPAP are most commonly used for obstructive sleep apnea (OSA) to keep the airways from collapsing when the muscles relax during sleep. What are the risks? Generally, this is a safe treatment. However, problems may occur, including: Irritated skin or skin sores if the mask does not fit properly. Dry or stuffy nose or nosebleeds. Dry mouth. Feeling gassy or bloated. Sinus or lung infection if the equipment is not cleaned properly. When should CPAP or BIPAP be used? In most cases, the mask only needs to be worn during sleep. Generally, the mask needs to be worn  throughout the night and during any daytime naps. People with certain medical conditions may also need to wear the mask at other times, such as when they are awake. Follow instructions from your health care provider about when to use the machine. What happens during CPAP or BIPAP?  Both CPAP and BIPAP are provided by a small machine with a flexible plastic tube that attaches to a plastic mask that you wear. Air is blown through the mask into your nose or mouth. The amount of pressure that is used to blow the air can be adjusted on the machine. Your health care provider will set the pressure setting and help you find the best mask for you. Tips for using the mask Because the mask needs to be snug, some people feel trapped or closed-in (claustrophobic) when first using the mask. If you feel this way, you may need to get used to the mask. One way to do this is to hold the mask loosely over your nose or mouth and then gradually apply the mask more snugly. You can also gradually increase the amount of time that you use the mask. Masks are available in various types and sizes. If your mask does not fit well, talk with your health care provider about getting a different one. Some common types of masks include: Full face masks, which fit over the mouth and nose. Nasal masks, which fit over the nose. Nasal pillow or prong masks, which fit into the nostrils. If you are using a mask that fits over your nose and you tend  to breathe through your mouth, a chin strap may be applied to help keep your mouth closed. Use a skin barrier to protect your skin as told by your health care provider. Some CPAP and BIPAP machines have alarms that may sound if the mask comes off or develops a leak. If you have trouble with the mask, it is very important that you talk with your health care provider about finding a way to make the mask easier to tolerate. Do not stop using the mask. There could be a negative impact on your health if  you stop using the mask. Tips for using the machine Place your CPAP or BIPAP machine on a secure table or stand near an electrical outlet. Know where the on/off switch is on the machine. Follow instructions from your health care provider about how to set the pressure on your machine and when you should use it. Do not eat or drink while the CPAP or BIPAP machine is on. Food or fluids could get pushed into your lungs by the pressure of the CPAP or BIPAP. For home use, CPAP and BIPAP machines can be rented or purchased through home health care companies. Many different brands of machines are available. Renting a machine before purchasing may help you find out which particular machine works well for you. Your health insurance company may also decide which machine you may get. Keep the CPAP or BIPAP machine and attachments clean. Ask your health care provider for specific instructions. Check the humidifier if you have a dry stuffy nose or nosebleeds. Make sure it is working correctly. Follow these instructions at home: Take over-the-counter and prescription medicines only as told by your health care provider. Ask if you can take sinus medicine if your sinuses are blocked. Do not use any products that contain nicotine or tobacco. These products include cigarettes, chewing tobacco, and vaping devices, such as e-cigarettes. If you need help quitting, ask your health care provider. Keep all follow-up visits. This is important. Contact a health care provider if: You have redness or pressure sores on your head, face, mouth, or nose from the mask or head gear. You have trouble using the CPAP or BIPAP machine. You cannot tolerate wearing the CPAP or BIPAP mask. Someone tells you that you snore even when wearing your CPAP or BIPAP. Get help right away if: You have trouble breathing. You feel confused. Summary CPAP and BIPAP are methods that use air pressure to keep your airways open and to help you breathe  well. If you have trouble with the mask, it is very important that you talk with your health care provider about finding a way to make the mask easier to tolerate. Do not stop using the mask. There could be a negative impact to your health if you stop using the mask. Follow instructions from your health care provider about when to use the machine. This information is not intended to replace advice given to you by your health care provider. Make sure you discuss any questions you have with your health care provider. Document Revised: 11/24/2020 Document Reviewed: 03/26/2020 Elsevier Patient Education  [employer].

## 2022-06-26 DIAGNOSIS — M25512 Pain in left shoulder: Secondary | ICD-10-CM | POA: Diagnosis not present

## 2022-06-26 DIAGNOSIS — M25519 Pain in unspecified shoulder: Secondary | ICD-10-CM | POA: Diagnosis not present

## 2022-06-26 DIAGNOSIS — Z9889 Other specified postprocedural states: Secondary | ICD-10-CM | POA: Diagnosis not present

## 2022-06-26 DIAGNOSIS — M5412 Radiculopathy, cervical region: Secondary | ICD-10-CM | POA: Diagnosis not present

## 2022-06-26 DIAGNOSIS — R29898 Other symptoms and signs involving the musculoskeletal system: Secondary | ICD-10-CM | POA: Diagnosis not present

## 2022-06-27 ENCOUNTER — Ambulatory Visit: Payer: Medicare HMO | Admitting: Internal Medicine

## 2022-06-27 ENCOUNTER — Encounter: Payer: Self-pay | Admitting: Internal Medicine

## 2022-06-27 ENCOUNTER — Ambulatory Visit (INDEPENDENT_AMBULATORY_CARE_PROVIDER_SITE_OTHER): Payer: Medicare HMO | Admitting: Internal Medicine

## 2022-06-27 VITALS — BP 132/80 | HR 72 | Temp 98.1°F | Ht 65.0 in | Wt 232.0 lb

## 2022-06-27 DIAGNOSIS — E7800 Pure hypercholesterolemia, unspecified: Secondary | ICD-10-CM | POA: Diagnosis not present

## 2022-06-27 DIAGNOSIS — I10 Essential (primary) hypertension: Secondary | ICD-10-CM

## 2022-06-27 DIAGNOSIS — M85852 Other specified disorders of bone density and structure, left thigh: Secondary | ICD-10-CM | POA: Diagnosis not present

## 2022-06-27 DIAGNOSIS — Z6838 Body mass index (BMI) 38.0-38.9, adult: Secondary | ICD-10-CM | POA: Diagnosis not present

## 2022-06-27 DIAGNOSIS — M5412 Radiculopathy, cervical region: Secondary | ICD-10-CM

## 2022-06-27 DIAGNOSIS — M1A09X0 Idiopathic chronic gout, multiple sites, without tophus (tophi): Secondary | ICD-10-CM | POA: Diagnosis not present

## 2022-06-27 DIAGNOSIS — R7303 Prediabetes: Secondary | ICD-10-CM

## 2022-06-27 DIAGNOSIS — R3129 Other microscopic hematuria: Secondary | ICD-10-CM

## 2022-06-27 LAB — URINALYSIS, ROUTINE W REFLEX MICROSCOPIC
Bilirubin, UA: NEGATIVE
Glucose, UA: NEGATIVE
Ketones, UA: NEGATIVE
Leukocytes,UA: NEGATIVE
Nitrite, UA: NEGATIVE
Protein,UA: NEGATIVE
RBC, UA: NEGATIVE
Specific Gravity, UA: 1.021 (ref 1.005–1.030)
Urobilinogen, Ur: 0.2 mg/dL (ref 0.2–1.0)
pH, UA: 7.5 (ref 5.0–7.5)

## 2022-06-27 NOTE — Patient Instructions (Signed)

## 2022-06-27 NOTE — Progress Notes (Signed)
I,Victoria T Hamilton,acting as a scribe for Maximino Greenland, MD.,have documented all relevant documentation on the behalf of Maximino Greenland, MD,as directed by  Maximino Greenland, MD while in the presence of Maximino Greenland, MD.    Subjective:     Patient ID: Robin Odom , female    DOB: 1951/01/14 , 72 y.o.   MRN: [id number]   Chief Complaint  Patient presents with   Prediabetes   Hypertension   Hyperlipidemia    HPI  She is here today for a prediabetes/HTN check. Patient reports compliance with her medications. She denies headaches, chest pain and shortness of breath. She is maintaining an active lifestyle - both socially and physically. She is going out of town tomorrow to [place] for a week.    She also would like to go over bone density test results.  She has yet to get the results from her GYN.   Additionally, she c/o left shoulder pain that has worsened over the past several weeks. She denies fall/trauma.  She also reports having h/o cervical spine disease. She also has intermittent shooting pains in her left arm. She denies having any LUE weakness. She is currently followed by Ortho.    Hypertension This is a chronic problem. The current episode started yesterday. The problem has been gradually improving since onset. Associated symptoms include neck pain. Risk factors for coronary artery disease include obesity and post-menopausal state. Past treatments include direct vasodilators, angiotensin blockers and diuretics.  Hyperlipidemia     Past Medical History:  Diagnosis Date   Arthritis    Diabetes mellitus without complication ([place])    Gout    Hypertension    Partial retinal tear of both eyes without detachment 08/29/2017     Family History  Problem Relation Age of Onset   Heart disease Mother    Hypertension Mother    Diabetes Mother    Arthritis Father    Hypertension Father    Prostate cancer Father    Parkinson's disease Father    Heart disease Father     Sinusitis Sister    Allergic rhinitis Sister    Diabetes Sister    High Cholesterol Sister    Hypertension Sister    Diabetes Brother    Hypertension Brother    High Cholesterol Brother    Hypertension Brother    Angioedema Neg Hx    Asthma Neg Hx    Eczema Neg Hx    Immunodeficiency Neg Hx    Urticaria Neg Hx    Breast cancer Neg Hx      Current Outpatient Medications:    allopurinol (ZYLOPRIM) 300 MG tablet, TAKE 1 TABLET BY MOUTH EVERY DAY, Disp: 90 tablet, Rfl: 0   amLODipine (NORVASC) 10 MG tablet, TAKE 1 TABLET BY MOUTH EVERY DAY, Disp: 90 tablet, Rfl: 1   Ascorbic Acid (VITAMIN C PO), Take 1,500 mg by mouth daily at 6 (six) AM., Disp: , Rfl:    atorvastatin (LIPITOR) 20 MG tablet, Take 1 tablet (20 mg total) by mouth daily., Disp: 30 tablet, Rfl: 11   B Complex Vitamins (VITAMIN-B COMPLEX) TABS, Take by mouth., Disp: , Rfl:    BENFOTIAMINE PO, Take by mouth 2 (two) times daily., Disp: , Rfl:    Calcium Carb-Cholecalciferol 500-10 MG-MCG TABS, Take 1 tablet by mouth daily., Disp: , Rfl:    carvedilol (COREG) 6.25 MG tablet, Take 1 tablet (6.25 mg total) by mouth 2 (two) times daily., Disp: 180 tablet, Rfl: 3  Cholecalciferol (VITAMIN D3) 125 MCG (5000 UT) CAPS, Take by mouth. , Disp: , Rfl:    colchicine 0.6 MG tablet, Take 0.6 mg by mouth as needed., Disp: , Rfl:    Cyanocobalamin (B-12) 5000 MCG CAPS, Take 1 capsule by mouth daily at 12 noon., Disp: , Rfl:    furosemide (LASIX) 20 MG tablet, TAKE 1 TABLET BY MOUTH EVERY DAY, Disp: 90 tablet, Rfl: 1   magnesium oxide (MAG-OX) 400 MG tablet, Take 400 mg by mouth daily., Disp: , Rfl:    melatonin 3 MG TABS tablet, Take 3 mg by mouth at bedtime., Disp: , Rfl:    olmesartan (BENICAR) 40 MG tablet, TAKE 1 TABLET BY MOUTH EVERY DAY, Disp: 90 tablet, Rfl: 2   Omega-3 Fatty Acids (OMEGA 3 PO), Take 2,500 mg by mouth daily. 2 tablets by mouth daily, Disp: , Rfl:    potassium chloride (MICRO-K) 10 MEQ CR capsule, TAKE 1 CAPSULE BY  MOUTH EVERY DAY, Disp: 90 capsule, Rfl: 1   RYBELSUS 3 MG TABS, , Disp: , Rfl:    triamcinolone cream (KENALOG) 0.1 %, APPLY TO AFFECTED AREA TWICE DAILY AS NEEDED, Disp: 45 g, Rfl: 0   TURMERIC CURCUMIN PO, Take by mouth daily., Disp: , Rfl:    Allergies  Allergen Reactions   Benadryl [Diphenhydramine] Other (See Comments)    Temporary Amnesia    Codeine Other (See Comments)    Hallucinations   Penicillins Hives     Review of Systems  Constitutional: Negative.   Respiratory: Negative.    Cardiovascular: Negative.   Musculoskeletal:  Positive for arthralgias and neck pain.  Neurological: Negative.   Psychiatric/Behavioral: Negative.       Today's Vitals   06/27/22 1021  BP: 132/80  Pulse: 72  Temp: 98.1 F (36.7 C)  SpO2: 98%  Weight: 232 lb (105.2 kg)  Height: '5\' 5"'$  (1.651 m)   Body mass index is 38.61 kg/m.  Wt Readings from Last 3 Encounters:  06/27/22 232 lb (105.2 kg)  06/21/22 233 lb (105.7 kg)  05/31/22 231 lb (104.8 kg)    Objective:  Physical Exam Vitals and nursing note reviewed.  Constitutional:      Appearance: Normal appearance. She is obese.  HENT:     Head: Normocephalic and atraumatic.     Nose:     Comments: Masked     Mouth/Throat:     Comments: Masked  Eyes:     Extraocular Movements: Extraocular movements intact.  Cardiovascular:     Rate and Rhythm: Normal rate and regular rhythm.     Heart sounds: Normal heart sounds.  Pulmonary:     Effort: Pulmonary effort is normal.     Breath sounds: Normal breath sounds.  Musculoskeletal:     Cervical back: Normal range of motion.  Skin:    General: Skin is warm.  Neurological:     General: No focal deficit present.     Mental Status: She is alert.  Psychiatric:        Mood and Affect: Mood normal.        Behavior: Behavior normal.       Assessment And Plan:     1. Prediabetes Comments: Chronic, she has resumed Rybelsus '3mg'$  daily. I will check an a1c today, encouraged to decrease her  intake of sugary foods/beverages. - Microalbumin / creatinine urine ratio - CMP14+EGFR - CBC - Hemoglobin A1c - Lipid panel  2. Essential hypertension, benign Comments: Chronic, controlled. She will c/w amodipine '10mg'$ ,  carvedilol 6.'25mg'$  po bid, furosemide and olmesartan '40mg'$  daily. Encouraged to follow low sodium diet. - Microalbumin / creatinine urine ratio - CMP14+EGFR - CBC - Hemoglobin A1c - Lipid panel  3. Osteopenia of neck of left femur Comments: Most recent bone density results reviewed. She is encouraged to engage in weight-bearing exercises at least 3 days/week while taking vitamin D/Calcium. - Vitamin D (25 hydroxy)  4. Cervical radiculopathy Comments: I will refer her for MRI cervical spine. If needed, will consider nerve conduction study. - MR Cervical Spine Wo Contrast; Future  5. Pure hypercholesterolemia Comments: Chronic, recently started on atorvastatin '20mg'$  daily. LDL goal < 70. I will check lipid panel and make further recommendations once results are reviewed. - Lipid panel  6. 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 tophus Comments: Chronic, she is also followed by Rheumatology. I will check uric acid level today.  She will c/w allopurinol '300mg'$  daily. - Uric acid  7. Microscopic hematuria Comments: I will repeat urinalysis w/ reflex today. If positive, will consider renal ultrasound for further evaluation. - Urinalysis, Reflex Microscopic  8. Class 2 severe obesity due to excess calories with serious comorbidity and body mass index (BMI) of 38.0 to 38.9 in adult [facility]) Comments: She is encouraged to aim for at least 150 minutes of exercise/week, while striving for BMI<30 to decrease cardiac risk.   Patient was given opportunity to ask questions. Patient verbalized understanding of the plan and was able to repeat key elements of the plan. All questions were answered to their satisfaction.   I, Maximino Greenland, MD, have reviewed all documentation  for this visit. The documentation on 06/27/22 for the exam, diagnosis, procedures, and orders are all accurate and complete.   IF YOU HAVE BEEN REFERRED TO A SPECIALIST, IT MAY TAKE 1-2 WEEKS TO SCHEDULE/PROCESS THE REFERRAL. IF YOU HAVE NOT HEARD FROM US/SPECIALIST IN TWO WEEKS, PLEASE GIVE [place] A CALL AT [phone].   THE PATIENT IS ENCOURAGED TO PRACTICE SOCIAL DISTANCING DUE TO THE COVID-19 PANDEMIC.

## 2022-06-28 LAB — CMP14+EGFR
ALT: 13 IU/L (ref 0–32)
AST: 20 IU/L (ref 0–40)
Albumin/Globulin Ratio: 1.5 (ref 1.2–2.2)
Albumin: 4.3 g/dL (ref 3.8–4.8)
Alkaline Phosphatase: 78 IU/L (ref 44–121)
BUN/Creatinine Ratio: 20 (ref 12–28)
BUN: 14 mg/dL (ref 8–27)
Bilirubin Total: 0.5 mg/dL (ref 0.0–1.2)
CO2: 24 mmol/L (ref 20–29)
Calcium: 9.9 mg/dL (ref 8.7–10.3)
Chloride: 104 mmol/L (ref 96–106)
Creatinine, Ser: 0.7 mg/dL (ref 0.57–1.00)
Globulin, Total: 2.8 g/dL (ref 1.5–4.5)
Glucose: 96 mg/dL (ref 70–99)
Potassium: 4.1 mmol/L (ref 3.5–5.2)
Sodium: 144 mmol/L (ref 134–144)
Total Protein: 7.1 g/dL (ref 6.0–8.5)
eGFR: 92 mL/min/{1.73_m2} (ref >59)

## 2022-06-28 LAB — CBC
Hematocrit: 40.8 % (ref 34.0–46.6)
Hemoglobin: 13.3 g/dL (ref 11.1–15.9)
MCH: 26 pg — ABNORMAL LOW (ref 26.6–33.0)
MCHC: 32.6 g/dL (ref 31.5–35.7)
MCV: 80 fL (ref 79–97)
Platelets: 328 10*3/uL (ref 150–450)
RBC: 5.11 x10E6/uL (ref 3.77–5.28)
RDW: 13.9 % (ref 11.7–15.4)
WBC: 8.2 10*3/uL (ref 3.4–10.8)

## 2022-06-28 LAB — LIPID PANEL
Chol/HDL Ratio: 2 ratio (ref 0.0–4.4)
Cholesterol, Total: 125 mg/dL (ref 100–199)
HDL: 61 mg/dL (ref >39)
LDL Chol Calc (NIH): 53 mg/dL (ref 0–99)
Triglycerides: 44 mg/dL (ref 0–149)
VLDL Cholesterol Cal: 11 mg/dL (ref 5–40)

## 2022-06-28 LAB — MICROALBUMIN / CREATININE URINE RATIO
Creatinine, Urine: 91.4 mg/dL
Microalb/Creat Ratio: 12 mg/g creat (ref 0–29)
Microalbumin, Urine: 10.6 ug/mL

## 2022-06-28 LAB — VITAMIN D 25 HYDROXY (VIT D DEFICIENCY, FRACTURES): Vit D, 25-Hydroxy: 68 ng/mL (ref 30.0–100.0)

## 2022-06-28 LAB — URIC ACID: Uric Acid: 3.6 mg/dL (ref 3.1–7.9)

## 2022-06-28 LAB — HEMOGLOBIN A1C
Est. average glucose Bld gHb Est-mCnc: 123 mg/dL
Hgb A1c MFr Bld: 5.9 % — ABNORMAL HIGH (ref 4.8–5.6)

## 2022-07-03 DIAGNOSIS — G4733 Obstructive sleep apnea (adult) (pediatric): Secondary | ICD-10-CM | POA: Diagnosis not present

## 2022-07-07 DIAGNOSIS — R6889 Other general symptoms and signs: Secondary | ICD-10-CM | POA: Diagnosis not present

## 2022-07-07 DIAGNOSIS — R29898 Other symptoms and signs involving the musculoskeletal system: Secondary | ICD-10-CM | POA: Diagnosis not present

## 2022-07-07 DIAGNOSIS — M25512 Pain in left shoulder: Secondary | ICD-10-CM | POA: Diagnosis not present

## 2022-07-07 DIAGNOSIS — M5412 Radiculopathy, cervical region: Secondary | ICD-10-CM | POA: Diagnosis not present

## 2022-07-09 ENCOUNTER — Other Ambulatory Visit (HOSPITAL_BASED_OUTPATIENT_CLINIC_OR_DEPARTMENT_OTHER): Payer: Self-pay | Admitting: Cardiology

## 2022-07-09 DIAGNOSIS — I10 Essential (primary) hypertension: Secondary | ICD-10-CM

## 2022-07-10 DIAGNOSIS — M25512 Pain in left shoulder: Secondary | ICD-10-CM | POA: Diagnosis not present

## 2022-07-10 DIAGNOSIS — R6889 Other general symptoms and signs: Secondary | ICD-10-CM | POA: Diagnosis not present

## 2022-07-10 DIAGNOSIS — R29898 Other symptoms and signs involving the musculoskeletal system: Secondary | ICD-10-CM | POA: Diagnosis not present

## 2022-07-10 DIAGNOSIS — M5412 Radiculopathy, cervical region: Secondary | ICD-10-CM | POA: Diagnosis not present

## 2022-07-10 NOTE — Telephone Encounter (Signed)
Rx request sent to pharmacy.  

## 2022-07-11 ENCOUNTER — Encounter: Payer: Self-pay | Admitting: Internal Medicine

## 2022-07-14 DIAGNOSIS — M25512 Pain in left shoulder: Secondary | ICD-10-CM | POA: Diagnosis not present

## 2022-07-14 DIAGNOSIS — R6889 Other general symptoms and signs: Secondary | ICD-10-CM | POA: Diagnosis not present

## 2022-07-14 DIAGNOSIS — R29898 Other symptoms and signs involving the musculoskeletal system: Secondary | ICD-10-CM | POA: Diagnosis not present

## 2022-07-14 DIAGNOSIS — M5412 Radiculopathy, cervical region: Secondary | ICD-10-CM | POA: Diagnosis not present

## 2022-07-17 ENCOUNTER — Other Ambulatory Visit: Payer: Medicare HMO

## 2022-07-18 ENCOUNTER — Telehealth: Payer: Self-pay | Admitting: Neurology

## 2022-07-18 DIAGNOSIS — R6889 Other general symptoms and signs: Secondary | ICD-10-CM | POA: Diagnosis not present

## 2022-07-18 DIAGNOSIS — G8929 Other chronic pain: Secondary | ICD-10-CM | POA: Diagnosis not present

## 2022-07-18 DIAGNOSIS — R29898 Other symptoms and signs involving the musculoskeletal system: Secondary | ICD-10-CM | POA: Diagnosis not present

## 2022-07-18 DIAGNOSIS — M5412 Radiculopathy, cervical region: Secondary | ICD-10-CM | POA: Diagnosis not present

## 2022-07-18 DIAGNOSIS — M25512 Pain in left shoulder: Secondary | ICD-10-CM | POA: Diagnosis not present

## 2022-07-18 NOTE — Telephone Encounter (Signed)
07/06/22 [employer] medicare no Josem Kaufmann req EE  3/19:[id number]-mla

## 2022-07-19 NOTE — Telephone Encounter (Signed)
Patient called back.  HST- [employer] medicare no auth req.  Patient is schedule at [facility] for 07/26/22 at 9:45 Am.  Sent mychart to the patient with appointment information.

## 2022-07-21 DIAGNOSIS — R29898 Other symptoms and signs involving the musculoskeletal system: Secondary | ICD-10-CM | POA: Diagnosis not present

## 2022-07-21 DIAGNOSIS — M5412 Radiculopathy, cervical region: Secondary | ICD-10-CM | POA: Diagnosis not present

## 2022-07-21 DIAGNOSIS — G8929 Other chronic pain: Secondary | ICD-10-CM | POA: Diagnosis not present

## 2022-07-21 DIAGNOSIS — M25512 Pain in left shoulder: Secondary | ICD-10-CM | POA: Diagnosis not present

## 2022-07-24 DIAGNOSIS — G8929 Other chronic pain: Secondary | ICD-10-CM | POA: Diagnosis not present

## 2022-07-24 DIAGNOSIS — M5412 Radiculopathy, cervical region: Secondary | ICD-10-CM | POA: Diagnosis not present

## 2022-07-24 DIAGNOSIS — R6889 Other general symptoms and signs: Secondary | ICD-10-CM | POA: Diagnosis not present

## 2022-07-24 DIAGNOSIS — M25512 Pain in left shoulder: Secondary | ICD-10-CM | POA: Diagnosis not present

## 2022-07-24 DIAGNOSIS — R29898 Other symptoms and signs involving the musculoskeletal system: Secondary | ICD-10-CM | POA: Diagnosis not present

## 2022-07-26 ENCOUNTER — Ambulatory Visit: Payer: Medicare HMO | Admitting: Neurology

## 2022-07-26 ENCOUNTER — Other Ambulatory Visit: Payer: Self-pay | Admitting: Physician Assistant

## 2022-07-26 DIAGNOSIS — G4733 Obstructive sleep apnea (adult) (pediatric): Secondary | ICD-10-CM

## 2022-07-26 DIAGNOSIS — M47812 Spondylosis without myelopathy or radiculopathy, cervical region: Secondary | ICD-10-CM

## 2022-07-26 DIAGNOSIS — Z9989 Dependence on other enabling machines and devices: Secondary | ICD-10-CM

## 2022-07-27 DIAGNOSIS — G8929 Other chronic pain: Secondary | ICD-10-CM | POA: Diagnosis not present

## 2022-07-27 DIAGNOSIS — M25512 Pain in left shoulder: Secondary | ICD-10-CM | POA: Diagnosis not present

## 2022-07-27 DIAGNOSIS — R29898 Other symptoms and signs involving the musculoskeletal system: Secondary | ICD-10-CM | POA: Diagnosis not present

## 2022-07-27 DIAGNOSIS — R6889 Other general symptoms and signs: Secondary | ICD-10-CM | POA: Diagnosis not present

## 2022-07-27 DIAGNOSIS — M5412 Radiculopathy, cervical region: Secondary | ICD-10-CM | POA: Diagnosis not present

## 2022-07-27 NOTE — Progress Notes (Signed)
[facility] TEST REPORT ( by Watch PAT)   STUDY DATE:  07-27-2022  MRN:  [id number]    ORDERING CLINICIAN: Larey Seat, MD  REFERRING CLINICIAN:    CLINICAL INFORMATION/HISTORY: I have the pleasure of seeing [name] , Odom, on 06/21/22 . She a right-handed AA female with a known OSA sleep disorder.  referral from Dr Robin Odom  for a new CPAP machine. She was last seen at [facility] for carpal tunnel in 2018 by Dr Robin Odom.  Chief concern according to patient :  Pt alone, rm 2, [name] Last SS's were in 2015 (doesn't have a copy of report and not in Epic). She has been on CPAP since 1994. DME respicare and the current machine was set up 04/22/2014 in [place], [name] , [place], [place]. The machine is displaying a message that it is at the end of its life.  She is planning to travel next week and would like a fast replacement.    She has risk factors : BMI is 38, also listed are pre-diabetes and hypertension, high cholesterol, and  Osteoarthritis. She had lost weight and as for w a while not needing CPAP but gained it back.  She used to travel for her job for 25 years and ate out- she was now finally able to prepare her own meals and is losing weight again, pre COVID weight 285 - got down to 225, and now again at 233.    In July - September 2023 she had insomnia, not sure what precipitated this. Not taking sleep aid.      Epworth sleepiness score: 8/24. FSS at  18/ 63 points   BMI: 39 kg/m   Neck Circumference: 16"   FINDINGS:   Sleep Summary:   Total Recording Time (hours, min): 8 hours 18 minutes       Total Sleep Time (hours, min):   6 hours 10 minutes              Percent REM (%):   16%                                     Respiratory Indices:   Calculated pAHI (per hour):     39.4/h                        REM pAHI:   48.2/h                                              NREM  pAHI:   37.7/h                           Positional AHI: The vast majority of the night was spent in supine sleep position associated with an AHI of 14/h.  Snoring level reached 45 dB at a mean volume and exceeded at times over 60 dB.  Snoring was present for more than half of the recording.  Oxygen Saturation Statistics:     O2 Saturation Range (%):   Between a nadir of 76 and a maximum of 98% saturation with a mean saturation at 92%                                    O2 Saturation (minutes) <89%: 8 minutes          Pulse Rate Statistics:   Pulse Mean (bpm):   73 bpm              Pulse Range: Between 58 and 94 bpm.  Please note that the only reflected value here is the heart rate and not cardiac rhythm.               IMPRESSION:  This HST confirms the presence of severe obstructive sleep apnea not REM sleep dependent, but REM sleep accentuated.  And associated with frequent oxygen desaturations.  The patient will need a new CPAP machine and she considers herself CPAP dependent. The patient is currently 100% compliant user.   RECOMMENDATION: Auto titration CPAP device with a setting between 6 and 16 cm water pressure without expiratory pressure relief.  Heated humidification and mask of patient's choice.  The patient currently uses a nasal cradle fullface mask.    INTERPRETING PHYSICIAN:   Larey Seat, MD   Medical Director of [facility] at [facility].

## 2022-08-03 DIAGNOSIS — Z9989 Dependence on other enabling machines and devices: Secondary | ICD-10-CM | POA: Insufficient documentation

## 2022-08-03 DIAGNOSIS — Z152 Genetic susceptibility to obesity: Secondary | ICD-10-CM | POA: Insufficient documentation

## 2022-08-03 NOTE — Procedures (Signed)
[facility] TEST REPORT ( by Watch PAT)   STUDY DATE:  07-27-2022  MRN:  [id number]    ORDERING CLINICIAN: Larey Seat, MD  REFERRING CLINICIAN:    CLINICAL INFORMATION/HISTORY: I have the pleasure of seeing Dr. Anson Fret , [name], on 06/21/22 . She a right-handed AA female with a known OSA sleep disorder.  referral from Dr Baird Cancer  for a new CPAP machine. She was last seen at [facility] for carpal tunnel in 2018 by Dr Jaynee Eagles.  Chief concern according to patient :  Pt alone, rm 2, [name] Last SS's were in 2015 (doesn't have a copy of report and not in Epic). She has been on CPAP since 1994. DME respicare and the current machine was set up 04/22/2014 in [place], [name] , [place], [place]. The machine is displaying a message that it is at the end of its life.  She is planning to travel next week and would like a fast replacement.    She has risk factors : BMI is 38, also listed are pre-diabetes and hypertension, high cholesterol, and  Osteoarthritis. She had lost weight and as for w a while not needing CPAP but gained it back.  She used to travel for her job for 25 years and ate out- she was now finally able to prepare her own meals and is losing weight again, pre COVID weight 285 - got down to 225, and now again at 233.    In July - September 2023 she had insomnia, not sure what precipitated this. Not taking sleep aid.      Epworth sleepiness score: 8/24. FSS at  18/ 63 points   BMI: 39 kg/m   Neck Circumference: 16"   FINDINGS:   Sleep Summary:   Total Recording Time (hours, min): 8 hours 18 minutes       Total Sleep Time (hours, min):   6 hours 10 minutes              Percent REM (%):   16%                                     Respiratory Indices:   Calculated pAHI (per hour):     39.4/h                        REM pAHI:   48.2/h                                              NREM pAHI:    37.7/h                           Positional AHI: The vast majority of the night was spent in supine sleep position associated with an AHI of 14/h.  Snoring level reached 45 dB at a mean volume and exceeded at times over 60 dB.  Snoring was present for more than half of the recording.  Oxygen Saturation Statistics:     O2 Saturation Range (%):   Between a nadir of 76 and a maximum of 98% saturation with a mean saturation at 92%                                    O2 Saturation (minutes) <89%: 8 minutes          Pulse Rate Statistics:   Pulse Mean (bpm):   73 bpm              Pulse Range: Between 58 and 94 bpm.  Please note that the only reflected value here is the heart rate and not cardiac rhythm.               IMPRESSION:  This HST confirms the presence of severe obstructive sleep apnea not REM sleep dependent, but REM sleep accentuated.  And associated with frequent oxygen desaturations.  The patient will need a new CPAP machine and she considers herself CPAP dependent. The patient is currently 100% compliant user.   RECOMMENDATION: Auto titration CPAP device with a setting between 6 and 16 cm water pressure without expiratory pressure relief.  Heated humidification and mask of patient's choice.  The patient currently uses a nasal cradle fullface mask.    INTERPRETING PHYSICIAN:   Larey Seat, MD   Medical Director of [facility] at [facility].

## 2022-08-03 NOTE — Addendum Note (Signed)
Addended by: Larey Seat on: 08/03/2022 05:07 PM   Modules accepted: Orders

## 2022-08-04 DIAGNOSIS — G8929 Other chronic pain: Secondary | ICD-10-CM | POA: Diagnosis not present

## 2022-08-04 DIAGNOSIS — M5412 Radiculopathy, cervical region: Secondary | ICD-10-CM | POA: Diagnosis not present

## 2022-08-04 DIAGNOSIS — R29898 Other symptoms and signs involving the musculoskeletal system: Secondary | ICD-10-CM | POA: Diagnosis not present

## 2022-08-04 DIAGNOSIS — R6889 Other general symptoms and signs: Secondary | ICD-10-CM | POA: Diagnosis not present

## 2022-08-04 DIAGNOSIS — M25512 Pain in left shoulder: Secondary | ICD-10-CM | POA: Diagnosis not present

## 2022-08-07 ENCOUNTER — Telehealth: Payer: Self-pay

## 2022-08-07 DIAGNOSIS — M25512 Pain in left shoulder: Secondary | ICD-10-CM | POA: Diagnosis not present

## 2022-08-07 DIAGNOSIS — M5412 Radiculopathy, cervical region: Secondary | ICD-10-CM | POA: Diagnosis not present

## 2022-08-07 DIAGNOSIS — R29898 Other symptoms and signs involving the musculoskeletal system: Secondary | ICD-10-CM | POA: Diagnosis not present

## 2022-08-07 DIAGNOSIS — G8929 Other chronic pain: Secondary | ICD-10-CM | POA: Diagnosis not present

## 2022-08-07 DIAGNOSIS — R6889 Other general symptoms and signs: Secondary | ICD-10-CM | POA: Diagnosis not present

## 2022-08-07 NOTE — Telephone Encounter (Signed)
Contacted pt, LVM rq call back  

## 2022-08-07 NOTE — Telephone Encounter (Signed)
-----   Message from Melvyn Novas, MD sent at 08/03/2022  5:07 PM EDT ----- Severe OSA .

## 2022-08-08 NOTE — Telephone Encounter (Signed)
Contacted pt X2, LVM rq call back.

## 2022-08-09 NOTE — Telephone Encounter (Signed)
I called pt. I advised pt that Dr. Vickey Huger reviewed their sleep study results and found that pt has sever sleep apnea. Dr. Vickey Huger  recommends that pt continue PAP therrpay with autoPAP. I reviewed PAP compliance expectations with the pt. Pt is agreeable to starting a CPAP. I advised pt that an order will be sent to a DME, [facility], and [facility] will call the pt within about one week after they file with the pt's insurance. [employer] will show the pt how to use the machine, fit for masks, and troubleshoot the CPAP if needed. A follow up appt was made for insurance purposes with Dr. Vickey Huger on 6/26/ 930. Pt verbalized understanding to arrive 15 minutes early and bring their CPAP. Pt verbalized understanding of results. Pt had no questions at this time but was encouraged to call back if questions arise. I have sent the order to [facility] and have received confirmation that they have received the order.

## 2022-08-11 DIAGNOSIS — G8929 Other chronic pain: Secondary | ICD-10-CM | POA: Diagnosis not present

## 2022-08-11 DIAGNOSIS — M25512 Pain in left shoulder: Secondary | ICD-10-CM | POA: Diagnosis not present

## 2022-08-11 DIAGNOSIS — R29898 Other symptoms and signs involving the musculoskeletal system: Secondary | ICD-10-CM | POA: Diagnosis not present

## 2022-08-11 DIAGNOSIS — M5412 Radiculopathy, cervical region: Secondary | ICD-10-CM | POA: Diagnosis not present

## 2022-08-11 DIAGNOSIS — R6889 Other general symptoms and signs: Secondary | ICD-10-CM | POA: Diagnosis not present

## 2022-08-16 DIAGNOSIS — G4733 Obstructive sleep apnea (adult) (pediatric): Secondary | ICD-10-CM | POA: Diagnosis not present

## 2022-08-17 DIAGNOSIS — M5412 Radiculopathy, cervical region: Secondary | ICD-10-CM | POA: Diagnosis not present

## 2022-08-17 DIAGNOSIS — R6889 Other general symptoms and signs: Secondary | ICD-10-CM | POA: Diagnosis not present

## 2022-08-17 DIAGNOSIS — R29898 Other symptoms and signs involving the musculoskeletal system: Secondary | ICD-10-CM | POA: Diagnosis not present

## 2022-08-17 DIAGNOSIS — G8929 Other chronic pain: Secondary | ICD-10-CM | POA: Diagnosis not present

## 2022-08-17 DIAGNOSIS — M25512 Pain in left shoulder: Secondary | ICD-10-CM | POA: Diagnosis not present

## 2022-08-20 DIAGNOSIS — R69 Illness, unspecified: Secondary | ICD-10-CM | POA: Diagnosis not present

## 2022-08-21 DIAGNOSIS — G8929 Other chronic pain: Secondary | ICD-10-CM | POA: Diagnosis not present

## 2022-08-21 DIAGNOSIS — R29898 Other symptoms and signs involving the musculoskeletal system: Secondary | ICD-10-CM | POA: Diagnosis not present

## 2022-08-21 DIAGNOSIS — R6889 Other general symptoms and signs: Secondary | ICD-10-CM | POA: Diagnosis not present

## 2022-08-21 DIAGNOSIS — M5412 Radiculopathy, cervical region: Secondary | ICD-10-CM | POA: Diagnosis not present

## 2022-08-21 DIAGNOSIS — M25512 Pain in left shoulder: Secondary | ICD-10-CM | POA: Diagnosis not present

## 2022-08-24 ENCOUNTER — Other Ambulatory Visit: Payer: Self-pay | Admitting: Internal Medicine

## 2022-08-24 DIAGNOSIS — R29898 Other symptoms and signs involving the musculoskeletal system: Secondary | ICD-10-CM | POA: Diagnosis not present

## 2022-08-24 DIAGNOSIS — M25512 Pain in left shoulder: Secondary | ICD-10-CM | POA: Diagnosis not present

## 2022-08-24 DIAGNOSIS — M5412 Radiculopathy, cervical region: Secondary | ICD-10-CM | POA: Diagnosis not present

## 2022-08-24 DIAGNOSIS — R6889 Other general symptoms and signs: Secondary | ICD-10-CM | POA: Diagnosis not present

## 2022-08-24 DIAGNOSIS — G8929 Other chronic pain: Secondary | ICD-10-CM | POA: Diagnosis not present

## 2022-09-07 DIAGNOSIS — M25512 Pain in left shoulder: Secondary | ICD-10-CM | POA: Diagnosis not present

## 2022-09-07 DIAGNOSIS — R29898 Other symptoms and signs involving the musculoskeletal system: Secondary | ICD-10-CM | POA: Diagnosis not present

## 2022-09-07 DIAGNOSIS — M5412 Radiculopathy, cervical region: Secondary | ICD-10-CM | POA: Diagnosis not present

## 2022-09-07 DIAGNOSIS — R6889 Other general symptoms and signs: Secondary | ICD-10-CM | POA: Diagnosis not present

## 2022-09-07 DIAGNOSIS — G8929 Other chronic pain: Secondary | ICD-10-CM | POA: Diagnosis not present

## 2022-09-11 DIAGNOSIS — R6889 Other general symptoms and signs: Secondary | ICD-10-CM | POA: Diagnosis not present

## 2022-09-11 DIAGNOSIS — R29898 Other symptoms and signs involving the musculoskeletal system: Secondary | ICD-10-CM | POA: Diagnosis not present

## 2022-09-11 DIAGNOSIS — M25512 Pain in left shoulder: Secondary | ICD-10-CM | POA: Diagnosis not present

## 2022-09-11 DIAGNOSIS — G8929 Other chronic pain: Secondary | ICD-10-CM | POA: Diagnosis not present

## 2022-09-11 DIAGNOSIS — M5412 Radiculopathy, cervical region: Secondary | ICD-10-CM | POA: Diagnosis not present

## 2022-09-12 DIAGNOSIS — G4733 Obstructive sleep apnea (adult) (pediatric): Secondary | ICD-10-CM | POA: Diagnosis not present

## 2022-09-14 DIAGNOSIS — R29898 Other symptoms and signs involving the musculoskeletal system: Secondary | ICD-10-CM | POA: Diagnosis not present

## 2022-09-14 DIAGNOSIS — R6889 Other general symptoms and signs: Secondary | ICD-10-CM | POA: Diagnosis not present

## 2022-09-14 DIAGNOSIS — M5412 Radiculopathy, cervical region: Secondary | ICD-10-CM | POA: Diagnosis not present

## 2022-09-14 DIAGNOSIS — G8929 Other chronic pain: Secondary | ICD-10-CM | POA: Diagnosis not present

## 2022-09-14 DIAGNOSIS — M25512 Pain in left shoulder: Secondary | ICD-10-CM | POA: Diagnosis not present

## 2022-09-15 DIAGNOSIS — G4733 Obstructive sleep apnea (adult) (pediatric): Secondary | ICD-10-CM | POA: Diagnosis not present

## 2022-09-18 DIAGNOSIS — M25512 Pain in left shoulder: Secondary | ICD-10-CM | POA: Diagnosis not present

## 2022-09-18 DIAGNOSIS — R6889 Other general symptoms and signs: Secondary | ICD-10-CM | POA: Diagnosis not present

## 2022-09-18 DIAGNOSIS — G8929 Other chronic pain: Secondary | ICD-10-CM | POA: Diagnosis not present

## 2022-09-18 DIAGNOSIS — R29898 Other symptoms and signs involving the musculoskeletal system: Secondary | ICD-10-CM | POA: Diagnosis not present

## 2022-09-18 DIAGNOSIS — M5412 Radiculopathy, cervical region: Secondary | ICD-10-CM | POA: Diagnosis not present

## 2022-09-27 ENCOUNTER — Other Ambulatory Visit: Payer: Self-pay | Admitting: Physician Assistant

## 2022-09-27 DIAGNOSIS — R6889 Other general symptoms and signs: Secondary | ICD-10-CM | POA: Diagnosis not present

## 2022-09-27 DIAGNOSIS — G8929 Other chronic pain: Secondary | ICD-10-CM | POA: Diagnosis not present

## 2022-09-27 DIAGNOSIS — R29898 Other symptoms and signs involving the musculoskeletal system: Secondary | ICD-10-CM | POA: Diagnosis not present

## 2022-09-27 DIAGNOSIS — M5412 Radiculopathy, cervical region: Secondary | ICD-10-CM | POA: Diagnosis not present

## 2022-09-27 DIAGNOSIS — M25512 Pain in left shoulder: Secondary | ICD-10-CM | POA: Diagnosis not present

## 2022-09-27 NOTE — Telephone Encounter (Signed)
Last Fill: 06/16/2022  Labs: 06/27/2022 M[facility] 26.0   Next Visit: 11/14/2022  Last Visit: 05/16/2022  DX: 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 tophus   Current Dose per office note 05/16/2022: Allopurinol 300 mg 1 tablet by mouth daily.   Okay to refill Allopurinol?

## 2022-10-02 DIAGNOSIS — G4733 Obstructive sleep apnea (adult) (pediatric): Secondary | ICD-10-CM | POA: Diagnosis not present

## 2022-10-03 DIAGNOSIS — R69 Illness, unspecified: Secondary | ICD-10-CM | POA: Diagnosis not present

## 2022-10-10 DIAGNOSIS — M79672 Pain in left foot: Secondary | ICD-10-CM | POA: Diagnosis not present

## 2022-10-10 DIAGNOSIS — G4733 Obstructive sleep apnea (adult) (pediatric): Secondary | ICD-10-CM | POA: Diagnosis not present

## 2022-10-10 DIAGNOSIS — M79671 Pain in right foot: Secondary | ICD-10-CM | POA: Diagnosis not present

## 2022-10-10 DIAGNOSIS — M21612 Bunion of left foot: Secondary | ICD-10-CM | POA: Diagnosis not present

## 2022-10-10 DIAGNOSIS — M21611 Bunion of right foot: Secondary | ICD-10-CM | POA: Diagnosis not present

## 2022-10-16 DIAGNOSIS — G4733 Obstructive sleep apnea (adult) (pediatric): Secondary | ICD-10-CM | POA: Diagnosis not present

## 2022-10-20 ENCOUNTER — Other Ambulatory Visit: Payer: Self-pay | Admitting: Internal Medicine

## 2022-10-25 ENCOUNTER — Ambulatory Visit: Payer: Medicare HMO | Admitting: Neurology

## 2022-10-25 ENCOUNTER — Encounter: Payer: Self-pay | Admitting: Internal Medicine

## 2022-10-25 ENCOUNTER — Encounter: Payer: Self-pay | Admitting: Neurology

## 2022-10-25 VITALS — BP 139/79 | HR 67 | Ht 65.0 in | Wt 230.0 lb

## 2022-10-25 DIAGNOSIS — Z9989 Dependence on other enabling machines and devices: Secondary | ICD-10-CM | POA: Diagnosis not present

## 2022-10-25 DIAGNOSIS — G4733 Obstructive sleep apnea (adult) (pediatric): Secondary | ICD-10-CM

## 2022-10-25 NOTE — Patient Instructions (Signed)
Healthy Living: Sleep In this video, you will learn why sleep is an important part of a healthy lifestyle. To view the content, go to this web address: [email or web address]  This video will expire on: 06/28/2024. If you need access to this video following this date, please reach out to the healthcare provider who assigned it to you. This information is not intended to replace advice given to you by your health care provider. Make sure you discuss any questions you have with your health care provider. Elsevier Patient Education  2024 [employer]  

## 2022-10-25 NOTE — Progress Notes (Signed)
Provider:  Melvyn Novas, MD  Primary Care Physician:  Dorothyann Peng, MD [address] [place] [address]     Referring Provider: Dorothyann Peng, Md [address] [place],  [place] [address]          Chief Complaint according to patient   Patient presents with:     Sleep patient (Initial Visit on new CPAP )            HISTORY OF PRESENT ILLNESS:  Robin Odom is a 72 y.o. female patient who is here for revisit 10/25/2022 for  CPAP follow p on new machine: sleep- has a CPAP machine ,was tested last in 06-2022 , now getting 7 hrs of sleep. Overall compliance has looked good. She states it took a while to adjust to the nasal mask. Medium size seems to be working the best. DME [employer]. She states that sometimes she has problems with dry mouth. She goes to the bathroom and has difficulty falling asleep because she has to readjust to the pressure of the machine. .  Chief concern according to patient :  I am more alert, more focussed in daytime. My sleep has improved.  I feel now less deprived of sleep and I was unable to sleep with the old equipment, I like the new CPAP a lot.  No longer taking melatonin, I drink sleepy time tea. My mind is resting.     Her HST confirmed the presence of severe obstructive sleep apnea :  The patient's overall AHI was 39.4/h REM sleep AHI was 48/h non-REM sleep AHI 37.7/h.  She spent the vast majority of sleeping on her back.  Snoring reached a level of 45 dB mean volume.  She did have 8 minutes of oxygen desaturation.  The mean pulse rate was 73 but manage and she had endorsed the Epworth score at 8 and the fatigue severity 8-18 out of 63 points. "not REM sleep dependent, but REM sleep accentuated.  And associated with frequent oxygen desaturations.  The patient will need a new CPAP machine and she considers herself CPAP dependent.  RECOMMENDATION: Auto titration CPAP device with a setting between 6 and 16 cm water  pressure without expiratory pressure relief.  Heated humidification and mask of patient's choice.  The patient currently uses a nasal cradle fullface mask."   The patient is currently 100% compliant user. Average use of 6 hours 57 minutes.  100% compliance by days and hours.  AutoSet between 6 and 16 cm of water no EPR, AHI is residual at 1.0 apneas per hour.  This is an excellent resolution of the patient's 95th percentile pressure is 13.3 cmH2O she does have a high air leakage 95th percentile 43.4 L but she also told me that she has not switched her machine off when she leaves the bed and I wonder how much of these are artefacts.         HISTORY OF PRESENT ILLNESS:    I have the pleasure of seeing Dr. [name] , [name], on 06/21/22 . She a right-handed AA female with a known OSA sleep disorder.  Robin Odom is a 72 y.o. female patient who is seen upon referral on 06/21/2022 from [name]  for a new CPAP machine . She was last seen at [facility] for carpal tunnel in 2018 by [name].  Chief concern according to patient :  Pt alone, rm 2, here as  internal ref for sleep eval. She is a current CPAP user. Last SS's were in 2015 (doesn't have a copy of report and not in epic). She has been on CPAP since 1994. DME [employer] and the current machine was set up 04/22/2014 in [place], [place] , [place].  The machine is displaying a message that it is at the end of its life. She is planning to travel next week and would like a fast replacement.    She has risk factors : BMI is 38, also listed are pre-diabetes and hypertension, high cholesterol, and  Osteoarthritis. She had lost weight and as for w a while not needing CPAP but gained it back.  She used to travel for her job for 25 years and ate out- she was now finally able to prepare her own meals and is losing weight again, pre COVID weight 285 - got down to 225, and now again at 233.    In July - September 2023 she had insomnia, not sure what  precipitated this. Not taking sleep aid.        The patient had the first sleep study in the year 1994 in [place]  with a result of OSA.  Sleep relevant medical history:No  Nocturia, , Snoring ( still while using CPAP ) and witnessed apnea in 1994 -  had a Tonsillectomy 1971, wisdom teeth removed 1980, cervical spine DDD,  dry mouth. Rare GERD. Dust allergies.   Family medical /sleep history: sister and brother are  other biological family members on CPAP with OSA.   Social history:  Patient is retired from [employer] at [employer]- and lives in a household alone. Family status is single. Pets are not  present. Tobacco use-none .  ETOH use rarely, Caffeine intake in form of Coffee( 1-2) Soda( /) Tea ( /) or energy drinks Exercise in form of personal trainer, water aerobics. .   Hobbies :cooking , volunteering.        OSA on CPAP-  last machine issued in [place] and now on its last leg.  Dr. Carmin Muskrat machine is a respite care [employer] machine the so-called air sense 10 AutoSet and was set up Christmas 2015.  She is 100% compliant user by days and 5 hours with an average of 6 hours 40 minutes nocturnal use time.  This machine is set between 6 and 15 cm water without expiratory relief pressure.  The residual AHI is 1.3/h.  There are no central apneas emerging.  91st percentile pressure is 15 cm water.  Air leakage at the 95th percentile is 36 L/min which does mean that there is a lot of air leakage. nasal cradle .     2) OSA risk factors are weight, BMI over 38 today. Cause for residual AHI and snoring are a poor mask fit or a worn down mask and head gear    3) dry mouth : humidifier reservoir is always empty , reset humidity.     Review of Systems: Out of a complete 14 system review, the patient complains of only the following symptoms, and all other reviewed systems are negative.:  Fatigue, sleepiness , snoring,  How likely are you to doze in the following situations: 0 = not likely, 1 =  slight chance, 2 = moderate chance, 3 = high chance   Sitting and Reading? Watching Television? Sitting inactive in a public place (theater or meeting)? As a passenger in a car for an hour without a break? Lying down in  the afternoon when circumstances permit? Sitting and talking to someone? Sitting quietly after lunch without alcohol? In a car, while stopped for a few minutes in traffic?   Total = 3/ 24 points   FSS endorsed at 12/ 63 points.   GDS : 4/ 15 points.   Social History   Socioeconomic History   Marital status: Single    Spouse name: Not on file   Number of children: Not on file   Years of education: Not on file   Highest education level: Doctorate in Engineer, water.   Occupational History   Occupation: Retired from [employer] / [employer]  " [employer] "  used to live in [place]. Worked in [place], [place] 6th.   Tobacco Use   Smoking status: Never    Passive exposure: Never   Smokeless tobacco: Never  Vaping Use   Vaping Use: Never used  Substance and Sexual Activity   Alcohol use: Yes    Comment: rarely   Drug use: No   Sexual activity: Not Currently    Birth control/protection: None    Comment: Hysterectomy  Other Topics Concern   Not on file  Social History Narrative   Not on file   Social Determinants of Health   Financial Resource Strain: Low Risk  (05/31/2022)   Overall Financial Resource Strain (CARDIA)    Difficulty of Paying Living Expenses: Not hard at all  Food Insecurity: No Food Insecurity (05/31/2022)   Hunger Vital Sign    Worried About Running Out of Food in the Last Year: Never true    Ran Out of Food in the Last Year: Never true  Transportation Needs: No Transportation Needs (05/31/2022)   PRAPARE - Administrator, Civil Service (Medical): No    Lack of Transportation (Non-Medical): No  Physical Activity: Sufficiently Active (05/31/2022)   Exercise Vital Sign    Days of Exercise per Week: 5 days    Minutes of Exercise  per Session: 30 min  Stress: No Stress Concern Present (05/31/2022)   [employer] of Occupational Health - Occupational Stress Questionnaire    Feeling of Stress : Not at all  Social Connections: Not on file    Family History  Problem Relation Age of Onset   Heart disease Mother    Hypertension Mother    Diabetes Mother    Arthritis Father    Hypertension Father    Prostate cancer Father    Parkinson's disease Father    Heart disease Father    Sinusitis Sister    Allergic rhinitis Sister    Diabetes Sister    High Cholesterol Sister    Hypertension Sister    Diabetes Brother    Hypertension Brother    High Cholesterol Brother    Hypertension Brother    Angioedema Neg Hx    Asthma Neg Hx    Eczema Neg Hx    Immunodeficiency Neg Hx    Urticaria Neg Hx    Breast cancer Neg Hx     Past Medical History:  Diagnosis Date   Arthritis    Diabetes mellitus without complication (HCC)    Gout    Hypertension    Partial retinal tear of both eyes without detachment 08/29/2017    Past Surgical History:  Procedure Laterality Date   ABDOMINAL HYSTERECTOMY  1991   CARPAL TUNNEL RELEASE Right 2019   COLONOSCOPY     5 between 1994-2010   Left knee surgery  2006   ROTATOR CUFF  REPAIR Left 2001   ROTATOR CUFF REPAIR Right 2003   SHOULDER SURGERY  2001/2003   TONSILECTOMY/ADENOIDECTOMY WITH MYRINGOTOMY  1971     Current Outpatient Medications on File Prior to Visit  Medication Sig Dispense Refill   allopurinol (ZYLOPRIM) 300 MG tablet TAKE 1 TABLET BY MOUTH EVERY DAY 90 tablet 0   amLODipine (NORVASC) 10 MG tablet TAKE 1 TABLET BY MOUTH EVERY DAY 90 tablet 1   Ascorbic Acid (VITAMIN C PO) Take 1,500 mg by mouth daily at 6 (six) AM.     atorvastatin (LIPITOR) 20 MG tablet Take 1 tablet (20 mg total) by mouth daily. 30 tablet 11   B Complex Vitamins (VITAMIN-B COMPLEX) TABS Take by mouth.     BENFOTIAMINE PO Take by mouth 2 (two) times daily.     Calcium  Carb-Cholecalciferol 500-10 MG-MCG TABS Take 1 tablet by mouth daily.     carvedilol (COREG) 6.25 MG tablet TAKE 1 TABLET BY MOUTH TWICE A DAY 180 tablet 1   Cholecalciferol (VITAMIN D3) 125 MCG (5000 UT) CAPS Take by mouth.      colchicine 0.6 MG tablet Take 0.6 mg by mouth as needed.     Cyanocobalamin (B-12) 5000 MCG CAPS Take 1 capsule by mouth daily at 12 noon.     furosemide (LASIX) 20 MG tablet TAKE 1 TABLET BY MOUTH EVERY DAY 90 tablet 1   magnesium oxide (MAG-OX) 400 MG tablet Take 400 mg by mouth daily.     melatonin 3 MG TABS tablet Take 3 mg by mouth at bedtime.     olmesartan (BENICAR) 40 MG tablet TAKE 1 TABLET BY MOUTH EVERY DAY 90 tablet 2   Omega-3 Fatty Acids (OMEGA 3 PO) Take 2,500 mg by mouth daily. 2 tablets by mouth daily     potassium chloride (MICRO-K) 10 MEQ CR capsule TAKE 1 CAPSULE BY MOUTH EVERY DAY 90 capsule 1   RYBELSUS 3 MG TABS TAKE ONE TABLET BY MOUTH BEFORE BREAKFAST WITH 4OZ WATER. 30 tablet 9   triamcinolone cream (KENALOG) 0.1 % APPLY TO AFFECTED AREA TWICE DAILY AS NEEDED 45 g 0   TURMERIC CURCUMIN PO Take by mouth daily.     No current facility-administered medications on file prior to visit.    Allergies  Allergen Reactions   Benadryl [Diphenhydramine] Other (See Comments)    Temporary Amnesia    Codeine Other (See Comments)    Hallucinations   Penicillins Hives     DIAGNOSTIC DATA (LABS, IMAGING, TESTING) - I reviewed patient records, labs, notes, testing and imaging myself where available.  Lab Results  Component Value Date   WBC 8.2 06/27/2022   HGB 13.3 06/27/2022   HCT 40.8 06/27/2022   MCV 80 06/27/2022   PLT 328 06/27/2022      Component Value Date/Time   NA 144 06/27/2022 1129   K 4.1 06/27/2022 1129   CL 104 06/27/2022 1129   CO2 24 06/27/2022 1129   GLUCOSE 96 06/27/2022 1129   GLUCOSE 91 11/15/2021 1108   BUN 14 06/27/2022 1129   CREATININE 0.70 06/27/2022 1129   CREATININE 0.75 11/15/2021 1108   CALCIUM 9.9  06/27/2022 1129   PROT 7.1 06/27/2022 1129   ALBUMIN 4.3 06/27/2022 1129   AST 20 06/27/2022 1129   ALT 13 06/27/2022 1129   ALKPHOS 78 06/27/2022 1129   BILITOT 0.5 06/27/2022 1129   GFRNONAA 67 05/13/2020 1414   GFRNONAA 67 02/04/2020 1505   GFRAA 77 05/13/2020 1414  GFRAA 78 02/04/2020 1505   Lab Results  Component Value Date   CHOL 125 06/27/2022   HDL 61 06/27/2022   LDLCALC 53 06/27/2022   TRIG 44 06/27/2022   CHOLHDL 2.0 06/27/2022   Lab Results  Component Value Date   HGBA1C 5.9 (H) 06/27/2022   No results found for: "VITAMINB12" No results found for: "TSH"  PHYSICAL EXAM:  Today's Vitals   10/25/22 0913  BP: 139/79  Pulse: 67  Weight: 230 lb (104.3 kg)  Height: 5\' 5"  (1.651 m)   Body mass index is 38.27 kg/m.   Wt Readings from Last 3 Encounters:  10/25/22 230 lb (104.3 kg)  06/27/22 232 lb (105.2 kg)  06/21/22 233 lb (105.7 kg)     Ht Readings from Last 3 Encounters:  10/25/22 5\' 5"  (1.651 m)  06/27/22 5\' 5"  (1.651 m)  06/21/22 5' 5.5" (1.664 m)      General: The patient is awake, alert and appears not in acute distress. The patient is well groomed. Head: Normocephalic, atraumatic. Neck is supple.  Mallampati 3,  neck circumference:15.5 inches.  Nasal airflow barely patent today.  Retrognathia is not seen.  Dental status: biological  Cardiovascular:  Regular rate and cardiac rhythm by pulse,  without distended neck veins. Respiratory: Lungs are clear to auscultation.  Skin:  With evidence of ankle edema. In support stockings. . Trunk: The patient's posture is erect.   NEUROLOGIC EXAM: The patient is awake and alert, oriented to place and time.   Memory subjective described as intact.  Attention span & concentration ability appears normal.  Speech is fluent,  without  dysarthria, dysphonia or aphasia.  Mood and affect are appropriate.   Cranial nerves: no loss of smell or taste reported  Pupils are equal and briskly reactive to light.  Funduscopic exam : deferred. .  Extraocular movements in vertical and horizontal planes were intact and without nystagmus. No Diplopia. Visual fields by finger perimetry are intact. Hearing was intact to y tuning fork   Facial sensation intact to fine touch.  Facial motor strength is symmetric and tongue and uvula move midline.  Neck ROM : rotation, tilt and flexion extension were normal for age and shoulder shrug was symmetrical.    Motor exam:  Symmetric bulk, tone and ROM.   Normal tone without cog wheeling, symmetric grip strength .   Sensory:  Fine touch, pinprick and vibration were tested  and  normal.  Proprioception tested in the upper extremities was normal.   Coordination: Rapid alternating movements in the fingers/hands were of normal speed.  The Finger-to-nose maneuver was intact without evidence of ataxia, dysmetria or tremor.   Gait and station: Patient could rise unassisted from a seated position, walked without assistive device. ( O per RN observation )   Stance is of normal width/ base and the patient turned with 3 steps.  Toe and heel walk were deferred.  Deep tendon reflexes: in the upper and lower extremities are symmetric and intact.  Babinski response was deferred.       ASSESSMENT AND PLAN 72 y.o. year old female  here with:    1) newly issued autotitration CPAP, with humidifier, new mask and  comfortable settings. 100% compliant, and happy with the clinical results. Has been sleeping longer, better and is able to focus.    2) RV in 12 months. Epworth, FSS and GDS to be documented,  if there is weight loss, need new neck circumference measured.  Risk factor reduction for  apnea would be including weight loss.   I plan to follow up either personally or through our NP within 12 months.   I would like to thank Dorothyann Peng, MD and Dorothyann Peng, Md [address] [place] [address] [place],  [place] [address] for allowing me to meet with and to take care of this  pleasant patient.   After spending a total time of  26  minutes face to face and additional time for physical and neurologic examination, review of laboratory studies,  personal review of imaging studies, reports and results of other testing and review of referral information / records as far as provided in visit,   Electronically signed by: Melvyn Novas, MD 10/25/2022 9:42 AM  [facility]  Board certified by The [employer] of Sleep Medicine and Diplomate of the [employer] of Sleep Medicine. Board certified In Neurology through the ABPN, Fellow of the [employer] of Neurology.

## 2022-10-26 ENCOUNTER — Ambulatory Visit (HOSPITAL_BASED_OUTPATIENT_CLINIC_OR_DEPARTMENT_OTHER): Payer: Medicare HMO | Admitting: Cardiology

## 2022-10-26 ENCOUNTER — Encounter (HOSPITAL_BASED_OUTPATIENT_CLINIC_OR_DEPARTMENT_OTHER): Payer: Self-pay | Admitting: Cardiology

## 2022-10-26 ENCOUNTER — Encounter: Payer: Self-pay | Admitting: Internal Medicine

## 2022-10-26 ENCOUNTER — Ambulatory Visit (INDEPENDENT_AMBULATORY_CARE_PROVIDER_SITE_OTHER): Payer: Medicare HMO | Admitting: Internal Medicine

## 2022-10-26 VITALS — BP 134/82 | HR 78 | Temp 98.3°F | Ht 65.0 in | Wt 228.6 lb

## 2022-10-26 VITALS — BP 130/78 | HR 67 | Ht 65.0 in | Wt 229.0 lb

## 2022-10-26 DIAGNOSIS — L989 Disorder of the skin and subcutaneous tissue, unspecified: Secondary | ICD-10-CM

## 2022-10-26 DIAGNOSIS — E7800 Pure hypercholesterolemia, unspecified: Secondary | ICD-10-CM

## 2022-10-26 DIAGNOSIS — E66812 Obesity, class 2: Secondary | ICD-10-CM

## 2022-10-26 DIAGNOSIS — Z6838 Body mass index (BMI) 38.0-38.9, adult: Secondary | ICD-10-CM

## 2022-10-26 DIAGNOSIS — R7303 Prediabetes: Secondary | ICD-10-CM

## 2022-10-26 DIAGNOSIS — I10 Essential (primary) hypertension: Secondary | ICD-10-CM | POA: Diagnosis not present

## 2022-10-26 DIAGNOSIS — G8929 Other chronic pain: Secondary | ICD-10-CM

## 2022-10-26 DIAGNOSIS — Z7189 Other specified counseling: Secondary | ICD-10-CM | POA: Diagnosis not present

## 2022-10-26 DIAGNOSIS — M25571 Pain in right ankle and joints of right foot: Secondary | ICD-10-CM | POA: Diagnosis not present

## 2022-10-26 DIAGNOSIS — R7989 Other specified abnormal findings of blood chemistry: Secondary | ICD-10-CM | POA: Diagnosis not present

## 2022-10-26 DIAGNOSIS — Z9189 Other specified personal risk factors, not elsewhere classified: Secondary | ICD-10-CM | POA: Diagnosis not present

## 2022-10-26 NOTE — Progress Notes (Signed)
Cardiology Office Note:  .    Date:  10/26/2022  ID:  Robin Odom, DOB 1950-06-15, MRN [id number] PCP: Robin Peng, MD  [facility] Providers Cardiologist:  Robin Red, MD     History of Present Illness: .    Robin Odom is a 72 y.o. female with a hx of hypertension, asthma who is seen for follow up today. I initially met her 08/02/20 as a new consult at the request of Robin Peng, MD for the evaluation and management of dyspnea on exertion.   CV risk: hypertension, hyperlipidemia, family history of heart disease. OSA on CPAP.  CV history: Treadmill stress with hypertensive response to exercise; echo also with diastolic dysfunction and mildly elevated filling pressure based on tissue doppler. LVEF normal, no significant valve disease.   In 09/2021 she confirmed well controlled blood pressures at home. Her LE edema was managed with Lasix. Per KPN, Tchol 188, HDL 54, LDL 122, TG 63 07/08/20. We discussed her elevated ASCVD risk and recommendations for statins. She declined. At her follow-up visit 03/2022 she noted limited exercise due to arthralgias and the cold weather. Her LE swelling was predominantly ankle bursa, with only trace edema. Home blood pressures reviewed, well controlled.  Today, she states that she is generally feeling good. However, she is struggling with right ankle and foot pain secondary to severe arthritis. She is unable to walk for longer than 10 minutes without being in significant pain. Participating in water aerobics has been more difficult, but she continues to exercise on the stationary bike as a back-up. Also she has obtained more supportive sneakers which helps somewhat. She has been following with her podiatrist. Her arthritis has been an issue in her left fingers as well. Recently she was unable to ambulate her fingers effectively for two days.  Also she reports persistent swelling in her RLE. She will prop herself up while in bed, and also wears  foot-less compression.  Lately she endorses stable blood pressures at home and at her other recent clinic visits. She notes it was 132/78 at this time yesterday with her neurology appointment. Today in the office her blood pressure is initially 142/82, which she states is the highest in several months. On manual recheck, her blood pressure has improved to 130/78.  In January, her CPAP machine had malfunctioned. She procured new equipment in April which has made a significant difference,   She denies any palpitations, chest pain, shortness of breath, lightheadedness, headaches, syncope, orthopnea, or PND.  ROS:  Please see the history of present illness. ROS otherwise negative except as noted.  (+) Right ankle and foot pain (+) Arthralgias (+) Persistent RLE swelling  Studies Reviewed: Marland Kitchen    EKG Interpretation Date/Time:  Thursday October 26 2022 11:13:28 EDT Ventricular Rate:  67 PR Interval:  176 QRS Duration:  92 QT Interval:  394 QTC Calculation: 416 R Axis:   -2  Text Interpretation: Normal sinus rhythm Normal ECG No previous ECGs available Confirmed by Robin Odom [phone]) on 10/26/2022 1:08:17 PM    Physical Exam:    VS:  BP 130/78 (BP Location: Right Arm, Patient Position: Sitting, Cuff Size: Large)   Pulse 67   Ht 5\' 5"  (1.651 m)   Wt 229 lb (103.9 kg)   BMI 38.11 kg/m    Wt Readings from Last 3 Encounters:  10/26/22 229 lb (103.9 kg)  10/25/22 230 lb (104.3 kg)  06/27/22 232 lb (105.2 kg)    GEN: Well nourished, well  developed in no acute distress HEENT: Normal, moist mucous membranes NECK: No JVD CARDIAC: regular rhythm, normal S1 and S2, no rubs or gallops. No murmur. VASCULAR: Radial and DP pulses 2+ bilaterally. No carotid bruits RESPIRATORY:  Clear to auscultation without rales, wheezing or rhonchi  ABDOMEN: Soft, non-tender, non-distended MUSCULOSKELETAL:  Ambulates independently SKIN: Warm and dry, trace to 1+ RLE edema. Trivial LLE edema. Prominent  bilateral ankle burse NEUROLOGIC:  Alert and oriented x 3. No focal neuro deficits noted. PSYCHIATRIC:  Normal affect   ASSESSMENT AND PLAN: .    Hypertension: goal <130/80 -Both echo and treadmill stress suggest that elevated blood pressure may be causing some of her dyspnea on exertion, both diastolic dysfunction and hypertensive BP response to exercise. Otherwise, structurally normal heart, no ischemia -Her LE swelling is predominantly ankle bursa, but she does have some mild pitting edema R>L -Home blood pressures with good control   Current medications Amlodipine 10 mg daily (at night) Furosemide 20 mg (usually once/day, two if swollen) Carvedilol 6.25 mg BID Olmesartan 40 mg daily (in the morning) Never been on spironolactone. Consider if additional med needed   -discussed exercise, sodium, how lifestyle can also affect BP   Hyperlipidemia: Elevated ASCVD risk score -we discussed her elevated ASCVD risk and recommendations for statins. She declines.   Class 2 obesity Pre-diabetes -working on weight loss, BMI 38 today -on rybelsis  CV risk counseling and prevention -recommend heart healthy/Mediterranean diet, with whole grains, fruits, vegetable, fish, lean meats, nuts, and olive oil. Limit salt. -recommend moderate walking, 3-5 times/week for 30-50 minutes each session. Aim for at least 150 minutes.week. Goal should be pace of 3 miles/hours, or walking 1.5 miles in 30 minutes -recommend avoidance of tobacco products. Avoid excess alcohol. -ASCVD risk score: The ASCVD Risk score (Arnett DK, et al., 2019) failed to calculate for the following reasons:   The valid total cholesterol range is 130 to 320 mg/dL    Dispo: Follow-up in 1 year, or sooner as needed.  I,Robin Odom,acting as a Neurosurgeon for [employer], MD.,have documented all relevant documentation on the behalf of Robin Red, MD,as directed by  Robin Red, MD while in the presence of  Robin Red, MD.  I, Robin Red, MD, have reviewed all documentation for this visit. The documentation on 10/26/22 for the exam, diagnosis, procedures, and orders are all accurate and complete.   Signed, Robin Red, MD

## 2022-10-26 NOTE — Patient Instructions (Signed)
Hypertension, Adult Hypertension is another name for high blood pressure. High blood pressure forces your heart to work harder to pump blood. This can cause problems over time. There are two numbers in a blood pressure reading. There is a top number (systolic) over a bottom number (diastolic). It is best to have a blood pressure that is below 120/80. What are the causes? The cause of this condition is not known. Some other conditions can lead to high blood pressure. What increases the risk? Some lifestyle factors can make you more likely to develop high blood pressure: Smoking. Not getting enough exercise or physical activity. Being overweight. Having too much fat, sugar, calories, or salt (sodium) in your diet. Drinking too much alcohol. Other risk factors include: Having any of these conditions: Heart disease. Diabetes. High cholesterol. Kidney disease. Obstructive sleep apnea. Having a family history of high blood pressure and high cholesterol. Age. The risk increases with age. Stress. What are the signs or symptoms? High blood pressure may not cause symptoms. Very high blood pressure (hypertensive crisis) may cause: Headache. Fast or uneven heartbeats (palpitations). Shortness of breath. Nosebleed. Vomiting or feeling like you may vomit (nauseous). Changes in how you see. Very bad chest pain. Feeling dizzy. Seizures. How is this treated? This condition is treated by making healthy lifestyle changes, such as: Eating healthy foods. Exercising more. Drinking less alcohol. Your doctor may prescribe medicine if lifestyle changes do not help enough and if: Your top number is above 130. Your bottom number is above 80. Your personal target blood pressure may vary. Follow these instructions at home: Eating and drinking  If told, follow the DASH eating plan. To follow this plan: Fill one half of your plate at each meal with fruits and vegetables. Fill one fourth of your plate  at each meal with whole grains. Whole grains include whole-wheat pasta, brown rice, and whole-grain bread. Eat or drink low-fat dairy products, such as skim milk or low-fat yogurt. Fill one fourth of your plate at each meal with low-fat (lean) proteins. Low-fat proteins include fish, chicken without skin, eggs, beans, and tofu. Avoid fatty meat, cured and processed meat, or chicken with skin. Avoid pre-made or processed food. Limit the amount of salt in your diet to less than 1,500 mg each day. Do not drink alcohol if: Your doctor tells you not to drink. You are pregnant, may be pregnant, or are planning to become pregnant. If you drink alcohol: Limit how much you have to: 0-1 drink a day for women. 0-2 drinks a day for men. Know how much alcohol is in your drink. In the [place] one drink equals one 12 oz bottle of beer (355 mL), one 5 oz glass of wine (148 mL), or one 1 oz glass of hard liquor (44 mL). Lifestyle  Work with your doctor to stay at a healthy weight or to lose weight. Ask your doctor what the best weight is for you. Get at least 30 minutes of exercise that causes your heart to beat faster (aerobic exercise) most days of the week. This may include walking, swimming, or biking. Get at least 30 minutes of exercise that strengthens your muscles (resistance exercise) at least 3 days a week. This may include lifting weights or doing Pilates. Do not smoke or use any products that contain nicotine or tobacco. If you need help quitting, ask your doctor. Check your blood pressure at home as told by your doctor. Keep all follow-up visits. Medicines Take over-the-counter and prescription medicines   only as told by your doctor. Follow directions carefully. Do not skip doses of blood pressure medicine. The medicine does not work as well if you skip doses. Skipping doses also puts you at risk for problems. Ask your doctor about side effects or reactions to medicines that you should watch  for. Contact a doctor if: You think you are having a reaction to the medicine you are taking. You have headaches that keep coming back. You feel dizzy. You have swelling in your ankles. You have trouble with your vision. Get help right away if: You get a very bad headache. You start to feel mixed up (confused). You feel weak or numb. You feel faint. You have very bad pain in your: Chest. Belly (abdomen). You vomit more than once. You have trouble breathing. These symptoms may be an emergency. Get help right away. Call 911. Do not wait to see if the symptoms will go away. Do not drive yourself to the hospital. Summary Hypertension is another name for high blood pressure. High blood pressure forces your heart to work harder to pump blood. For most people, a normal blood pressure is less than 120/80. Making healthy choices can help lower blood pressure. If your blood pressure does not get lower with healthy choices, you may need to take medicine. This information is not intended to replace advice given to you by your health care provider. Make sure you discuss any questions you have with your health care provider. Document Revised: 02/03/2021 Document Reviewed: 02/03/2021 Elsevier Patient Education  2024 [employer]  

## 2022-10-26 NOTE — Progress Notes (Unsigned)
I,[name], CMA,acting as a Neurosurgeon for Gwynneth Aliment, MD.,have documented all relevant documentation on the behalf of Gwynneth Aliment, MD,as directed by  Gwynneth Aliment, MD while in the presence of Gwynneth Aliment, MD.  Subjective:  Patient ID: Robin Odom , female    DOB: December 01, 1950 , 72 y.o.   MRN: [id number]  Chief Complaint  Patient presents with   Hypertension   Prediabetes   Hyperlipidemia    HPI  She is here today for a prediabetes/HTN check. Patient reports compliance with her medications. She denies headaches, chest pain and shortness of breath. She is maintaining an active lifestyle - both socially and physically.   She plans to schedule her colonoscopy in the near future.   Hypertension This is a chronic problem. The current episode started yesterday. The problem has been gradually improving since onset. Risk factors for coronary artery disease include obesity and post-menopausal state. Past treatments include direct vasodilators, angiotensin blockers and diuretics.     Past Medical History:  Diagnosis Date   Arthritis    Diabetes mellitus without complication (HCC)    Gout    Hypertension    Partial retinal tear of both eyes without detachment 08/29/2017     Family History  Problem Relation Age of Onset   Heart disease Mother    Hypertension Mother    Diabetes Mother    Arthritis Father    Hypertension Father    Prostate cancer Father    Parkinson's disease Father    Heart disease Father    Sinusitis Sister    Allergic rhinitis Sister    Diabetes Sister    High Cholesterol Sister    Hypertension Sister    Diabetes Brother    Hypertension Brother    High Cholesterol Brother    Hypertension Brother    Angioedema Neg Hx    Asthma Neg Hx    Eczema Neg Hx    Immunodeficiency Neg Hx    Urticaria Neg Hx    Breast cancer Neg Hx      Current Outpatient Medications:    allopurinol (ZYLOPRIM) 300 MG tablet, TAKE 1 TABLET BY MOUTH EVERY DAY,  Disp: 90 tablet, Rfl: 0   Ascorbic Acid (VITAMIN C PO), Take 1,500 mg by mouth daily at 6 (six) AM., Disp: , Rfl:    atorvastatin (LIPITOR) 20 MG tablet, Take 1 tablet (20 mg total) by mouth daily., Disp: 30 tablet, Rfl: 11   B Complex Vitamins (VITAMIN-B COMPLEX) TABS, Take by mouth., Disp: , Rfl:    BENFOTIAMINE PO, Take by mouth 2 (two) times daily., Disp: , Rfl:    Calcium Carb-Cholecalciferol 500-10 MG-MCG TABS, Take 1 tablet by mouth daily., Disp: , Rfl:    carvedilol (COREG) 6.25 MG tablet, TAKE 1 TABLET BY MOUTH TWICE A DAY, Disp: 180 tablet, Rfl: 1   Cholecalciferol (VITAMIN D3) 125 MCG (5000 UT) CAPS, Take by mouth. , Disp: , Rfl:    colchicine 0.6 MG tablet, Take 0.6 mg by mouth as needed., Disp: , Rfl:    Cyanocobalamin (B-12) 5000 MCG CAPS, Take 1 capsule by mouth daily at 12 noon., Disp: , Rfl:    furosemide (LASIX) 20 MG tablet, TAKE 1 TABLET BY MOUTH EVERY DAY, Disp: 90 tablet, Rfl: 1   magnesium oxide (MAG-OX) 400 MG tablet, Take 400 mg by mouth daily., Disp: , Rfl:    melatonin 3 MG TABS tablet, Take 3 mg by mouth at bedtime., Disp: , Rfl:  olmesartan (BENICAR) 40 MG tablet, TAKE 1 TABLET BY MOUTH EVERY DAY, Disp: 90 tablet, Rfl: 2   Omega-3 Fatty Acids (OMEGA 3 PO), Take 2,500 mg by mouth daily. 2 tablets by mouth daily, Disp: , Rfl:    RYBELSUS 3 MG TABS, TAKE ONE TABLET BY MOUTH BEFORE BREAKFAST WITH 4OZ WATER., Disp: 30 tablet, Rfl: 9   triamcinolone cream (KENALOG) 0.1 %, APPLY TO AFFECTED AREA TWICE DAILY AS NEEDED, Disp: 45 g, Rfl: 0   TURMERIC CURCUMIN PO, Take by mouth daily., Disp: , Rfl:    amLODipine (NORVASC) 10 MG tablet, TAKE 1 TABLET BY MOUTH EVERY DAY, Disp: 90 tablet, Rfl: 1   potassium chloride (MICRO-K) 10 MEQ CR capsule, TAKE 1 CAPSULE BY MOUTH EVERY DAY, Disp: 90 capsule, Rfl: 1   Allergies  Allergen Reactions   Benadryl [Diphenhydramine] Other (See Comments)    Temporary Amnesia    Codeine Other (See Comments)    Hallucinations    Penicillins Hives     Review of Systems  Constitutional: Negative.   Respiratory: Negative.    Cardiovascular: Negative.   Gastrointestinal: Negative.   Skin: Negative.   Neurological: Negative.   Psychiatric/Behavioral: Negative.       Today's Vitals   10/26/22 1447  BP: 134/82  Pulse: 78  Temp: 98.3 F (36.8 C)  SpO2: 98%  Weight: 228 lb 9.6 oz (103.7 kg)  Height: 5\' 5"  (1.651 m)   Body mass index is 38.04 kg/m.  Wt Readings from Last 3 Encounters:  10/26/22 228 lb 9.6 oz (103.7 kg)  10/26/22 229 lb (103.9 kg)  10/25/22 230 lb (104.3 kg)    BP Readings from Last 3 Encounters:  10/26/22 134/82  10/26/22 130/78  10/25/22 139/79     Objective:  Physical Exam Vitals and nursing note reviewed.  Constitutional:      Appearance: Normal appearance. She is obese.  HENT:     Head: Normocephalic and atraumatic.  Eyes:     Extraocular Movements: Extraocular movements intact.  Cardiovascular:     Rate and Rhythm: Normal rate and regular rhythm.     Heart sounds: Normal heart sounds.  Pulmonary:     Effort: Pulmonary effort is normal.     Breath sounds: Normal breath sounds.  Skin:    General: Skin is warm.     Comments: Hyperpigmented, irregularly shaped lesion on lower right leg  Neurological:     General: No focal deficit present.     Mental Status: She is alert.  Psychiatric:        Mood and Affect: Mood normal.        Behavior: Behavior normal.         Assessment And Plan:  Essential hypertension, benign Assessment & Plan: Chronic, fair control. Goal BP<130/80.  She will continue with carvedilol 6.25mg  bid, olmesartan 40mg , amlodipine 10mg  and furosemide 20mg  daily. She is encouraged to follow a low sodium diet.   Orders: -     TSH  Prediabetes Assessment & Plan: Previous labs reviewed, her A1c has been elevated in the past. I will check an A1c today. Reminded to avoid refined sugars including sugary drinks/foods and processed meats including bacon,  sausages and deli meats.    Orders: -     CMP14+EGFR -     Hemoglobin A1c  Chronic pain of right ankle Assessment & Plan: Chronic, she is encouraged to inquire about laser therapy. She may find relief in this procedure.    Skin lesion of right  leg Assessment & Plan: Chronic, she is now under the care of Dermatology, Dr. Onalee Hua.    Class 2 severe obesity due to excess calories with serious comorbidity and body mass index (BMI) of 38.0 to 38.9 in adult [facility]) Assessment & Plan: She is encouraged to strive for BMI less than 30 to decrease cardiac risk. Advised to aim for at least 150 minutes of exercise per week.    Other orders -     T4, free -     Specimen status report  She is encouraged to strive for BMI less than 30 to decrease cardiac risk. Advised to aim for at least 150 minutes of exercise per week.    Return if symptoms worsen or fail to improve.  Patient was given opportunity to ask questions. Patient verbalized understanding of the plan and was able to repeat key elements of the plan. All questions were answered to their satisfaction.   I, Gwynneth Aliment, MD, have reviewed all documentation for this visit. The documentation on 10/26/22 for the exam, diagnosis, procedures, and orders are all accurate and complete.   IF YOU HAVE BEEN REFERRED TO A SPECIALIST, IT MAY TAKE 1-2 WEEKS TO SCHEDULE/PROCESS THE REFERRAL. IF YOU HAVE NOT HEARD FROM US/SPECIALIST IN TWO WEEKS, PLEASE GIVE [place] A CALL AT [phone].   THE PATIENT IS ENCOURAGED TO PRACTICE SOCIAL DISTANCING DUE TO THE COVID-19 PANDEMIC.

## 2022-10-26 NOTE — Patient Instructions (Signed)
Medication Instructions:  Your physician recommends that you continue on your current medications as directed. Please refer to the Current Medication list given to you today.  *If you need a refill on your cardiac medications before your next appointment, please call your pharmacy*  Lab Work: NONE  Testing/Procedures: NONE  Follow-Up: At [facility], you and your health needs are our priority.  As part of our continuing mission to provide you with exceptional heart care, we have created designated Provider Care Teams.  These Care Teams include your primary Cardiologist (physician) and Advanced Practice Providers (APPs -  Physician Assistants and Nurse Practitioners) who all work together to provide you with the care you need, when you need it.  We recommend signing up for the patient portal called "MyChart".  Sign up information is provided on this After Visit Summary.  MyChart is used to connect with patients for Virtual Visits (Telemedicine).  Patients are able to view lab/test results, encounter notes, upcoming appointments, etc.  Non-urgent messages can be sent to your provider as well.   To learn more about what you can do with MyChart, go to [email or web address].    Your next appointment:   12 month(s)  The format for your next appointment:   In Person  Provider:   Jodelle Red, MD OR Ronn Melena NP

## 2022-10-27 ENCOUNTER — Encounter: Payer: Self-pay | Admitting: Internal Medicine

## 2022-10-27 LAB — CMP14+EGFR
ALT: 12 IU/L (ref 0–32)
AST: 19 IU/L (ref 0–40)
Albumin: 4.2 g/dL (ref 3.8–4.8)
Alkaline Phosphatase: 73 IU/L (ref 44–121)
BUN/Creatinine Ratio: 24 (ref 12–28)
BUN: 15 mg/dL (ref 8–27)
Bilirubin Total: 0.3 mg/dL (ref 0.0–1.2)
CO2: 25 mmol/L (ref 20–29)
Calcium: 9.7 mg/dL (ref 8.7–10.3)
Chloride: 103 mmol/L (ref 96–106)
Creatinine, Ser: 0.63 mg/dL (ref 0.57–1.00)
Globulin, Total: 2.5 g/dL (ref 1.5–4.5)
Glucose: 89 mg/dL (ref 70–99)
Potassium: 3.7 mmol/L (ref 3.5–5.2)
Sodium: 142 mmol/L (ref 134–144)
Total Protein: 6.7 g/dL (ref 6.0–8.5)
eGFR: 94 mL/min/{1.73_m2} (ref >59)

## 2022-10-27 LAB — TSH: TSH: 0.459 u[IU]/mL (ref 0.450–4.500)

## 2022-10-27 LAB — HEMOGLOBIN A1C
Est. average glucose Bld gHb Est-mCnc: 126 mg/dL
Hgb A1c MFr Bld: 6 % — ABNORMAL HIGH (ref 4.8–5.6)

## 2022-10-29 ENCOUNTER — Other Ambulatory Visit: Payer: Self-pay | Admitting: Internal Medicine

## 2022-10-29 ENCOUNTER — Encounter: Payer: Self-pay | Admitting: Internal Medicine

## 2022-10-30 DIAGNOSIS — R69 Illness, unspecified: Secondary | ICD-10-CM | POA: Diagnosis not present

## 2022-10-31 LAB — T4, FREE: Free T4: 1.8 ng/dL — ABNORMAL HIGH (ref 0.82–1.77)

## 2022-10-31 LAB — SPECIMEN STATUS REPORT

## 2022-10-31 NOTE — Progress Notes (Signed)
Office Visit Note  Patient: Robin Odom             Date of Birth: [birth year]           MRN: [id number]             PCP: Dorothyann Peng, MD Referring: Dorothyann Peng, MD Visit Date: 11/14/2022 Occupation: @GUAROCC @  Subjective:  Pain in both feet  History of Present Illness: Robin Odom is a 72 y.o. female with history of osteoarthritis and gout.  She has been having pain and discomfort in her bilateral feet since January. Patient was evaluated by Dr. Romualdo Bolk (podiatrist at [facility]) on October 10, 2022.  She presented with pain and discomfort in her bilateral feet.  She states that the pain gets worse while standing or walking.  Pain is mostly on the top of her feet and also on the outer foot is stiff.  She has been experiencing increased pain in her right than the left foot.  She had x-rays obtained that day which showed midfoot degenerative changes bilaterally and bilateral bunions.  I reviewed notes from Dr.Dial's visit.  According to his note they could be peroneal tendinitis.  He also noted bilateral pedal edema.  He recommended wearing supportive shoes and recommended fluoroscopic guided TMT injection if symptoms get worse.  She bought a pair of new shoes which has been helpful.  She has not had any gout flares.  She continues to have neck pain due to degenerative disc disease.  She had physical therapy for neck pain and radiculopathy which has improved.  She has been doing exercises at home.  She also has discomfort in her hands and knees.  Activities of Daily Living:  Patient reports morning stiffness varying from day to day.  Patient Denies nocturnal pain.  Difficulty dressing/grooming: Denies Difficulty climbing stairs: Denies Difficulty getting out of chair: Denies Difficulty using hands for taps, buttons, cutlery, and/or writing: Reports  Review of Systems  Constitutional:  Negative for fatigue.  HENT:  Negative for mouth sores and mouth dryness.   Eyes:  Negative for  dryness.  Respiratory:  Negative for shortness of breath.   Cardiovascular:  Negative for chest pain and palpitations.  Gastrointestinal:  Negative for blood in stool, constipation and diarrhea.  Endocrine: Negative for increased urination.  Genitourinary:  Negative for involuntary urination.  Musculoskeletal:  Positive for joint pain, joint pain, joint swelling, myalgias, morning stiffness, muscle tenderness and myalgias. Negative for gait problem and muscle weakness.  Skin:  Negative for color change, rash, hair loss and sensitivity to sunlight.  Allergic/Immunologic: Negative for susceptible to infections.  Neurological:  Negative for dizziness and headaches.  Hematological:  Negative for swollen glands.  Psychiatric/Behavioral:  Negative for depressed mood and sleep disturbance. The patient is not nervous/anxious.     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 08/03/2022   History of wheezing 03/18/2020   Osteopenia of multiple sites 10/30/2019   Prediabetes 05/14/2018   Essential hypertension, benign 05/14/2018   Morbid obesity due to excess calories (HCC) 05/14/2018   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 tophus 07/20/2016  Primary osteoarthritis of both hands 07/20/2016   Primary osteoarthritis of both knees 07/20/2016   Primary osteoarthritis of both feet 07/20/2016   DJD (degenerative joint disease), cervical 07/20/2016   Elevated CK 07/20/2016   History of humerus fracture 07/20/2016   History of hypertension 07/20/2016   History of diabetes mellitus 07/20/2016   History of hyperlipidemia 07/20/2016   Chronic rhinitis  10/20/2015   Anosmia 10/20/2015   Mild intermittent asthma 10/20/2015    Past Medical History:  Diagnosis Date   Arthritis    Diabetes mellitus without complication (HCC)    Gout    Hypertension    Partial retinal tear of both eyes without detachment 08/29/2017    Family History  Problem Relation Age of Onset   Heart disease Mother    Hypertension Mother    Diabetes Mother    Arthritis Father    Hypertension Father    Prostate cancer Father    Parkinson's disease Father    Heart disease Father    Sinusitis Sister    Allergic rhinitis Sister    Diabetes Sister    High Cholesterol Sister    Hypertension Sister    Diabetes Brother    Hypertension Brother    High Cholesterol Brother    Hypertension Brother    Angioedema Neg Hx    Asthma Neg Hx    Eczema Neg Hx    Immunodeficiency Neg Hx    Urticaria Neg Hx    Breast cancer Neg Hx    Past Surgical History:  Procedure Laterality Date   ABDOMINAL HYSTERECTOMY  1991   CARPAL TUNNEL RELEASE Right 2019   COLONOSCOPY     5 between 1994-2010   Left knee surgery  2006   ROTATOR CUFF REPAIR Left 2001   ROTATOR CUFF REPAIR Right 2003   SHOULDER SURGERY  2001/2003   TONSILECTOMY/ADENOIDECTOMY WITH MYRINGOTOMY  1971   Social History   Social History Narrative   Not on file   Immunization History  Administered Date(s) Administered   COVID-19, mRNA, vaccine(Comirnaty)12 years and older 09/28/2022   Influenza, High Dose Seasonal PF 02/05/2017, 02/12/2018, 12/16/2018, 01/13/2020, 01/18/2021   Influenza,inj,quad, With Preservative 02/13/2017, 02/01/2018   Influenza-Unspecified 02/03/2015, 01/18/2018, 01/29/2018, 12/16/2018, 01/18/2022   PFIZER(Purple Top)SARS-COV-2 Vaccination 01/01/2019, 01/23/2019, 08/19/2019, 08/13/2020   Pfizer Covid-19 Vaccine Bivalent Booster 89yrs & up 04/03/2021, 01/18/2022   Pneumococcal Conjugate-13 01/20/2014   Pneumococcal Polysaccharide-23 10/09/2016   Respiratory Syncytial Virus Vaccine,Recomb  Aduvanted(Arexvy) 05/23/2022   Rsv, Bivalent, Protein Subunit Rsvpref,pf Verdis Frederickson) 05/23/2022   Tdap 12/13/2020   Unspecified SARS-COV-2 Vaccination 08/20/2019   Zoster Recombinant(Shingrix) 10/18/2019, 03/05/2020     Objective: Vital Signs: BP (!) 150/81 (BP Location: Left Arm, Patient Position: Sitting, Cuff Size: Large)   Pulse 74   Resp 17   Ht 5' 5.5" (1.664 m)   Wt 231 lb 12.8 oz (105.1 kg)   BMI 37.99 kg/m    Physical Exam Vitals and nursing note reviewed.  Constitutional:      Appearance: She is well-developed.  HENT:     Head: Normocephalic and atraumatic.  Eyes:     Conjunctiva/sclera: Conjunctivae normal.  Cardiovascular:     Rate and Rhythm: Normal rate and regular rhythm.     Heart sounds: Normal heart sounds.  Pulmonary:     Effort: Pulmonary effort is normal.     Breath sounds: Normal breath sounds.  Abdominal:     General: Bowel sounds are normal.     Palpations: Abdomen is soft.  Musculoskeletal:  Cervical back: Normal range of motion.     Right lower leg: Edema present.     Left lower leg: Edema present.  Lymphadenopathy:     Cervical: No cervical adenopathy.  Skin:    General: Skin is warm and dry.     Capillary Refill: Capillary refill takes less than 2 seconds.  Neurological:     Mental Status: She is alert and oriented to person, place, and time.  Psychiatric:        Behavior: Behavior normal.      Musculoskeletal Exam: Patient had good range of motion of the cervical spine without any discomfort.  Shoulder joints, elbow joints, wrist joints, MCPs PIPs and DIPs been good range of motion with no synovitis.  CMC PIP and DIP thickening was noted.  Hip joints and knee joints with good range of motion.  She had bilateral bunions  were noted.  CDAI Exam: CDAI Score: -- Patient Global: --; Provider Global: -- Swollen: --; Tender: -- Joint Exam 11/14/2022   No joint exam has been documented for this visit   There is currently no information  documented on the homunculus. Go to the Rheumatology activity and complete the homunculus joint exam.  Investigation: No additional findings.  Imaging: No results found.  Recent Labs: Lab Results  Component Value Date   WBC 8.2 06/27/2022   HGB 13.3 06/27/2022   PLT 328 06/27/2022   NA 142 10/26/2022   K 3.7 10/26/2022   CL 103 10/26/2022   CO2 25 10/26/2022   GLUCOSE 89 10/26/2022   BUN 15 10/26/2022   CREATININE 0.63 10/26/2022   BILITOT 0.3 10/26/2022   ALKPHOS 73 10/26/2022   AST 19 10/26/2022   ALT 12 10/26/2022   PROT 6.7 10/26/2022   ALBUMIN 4.2 10/26/2022   CALCIUM 9.7 10/26/2022   GFRAA 77 05/13/2020    Radiographs taken 3 views standing of the right and left foot reveal no fracture or dislocation. The forefoot and hindfoot joints are congruent. There is evidence of joint space narrowing noted affecting the 1st 2nd and 3rd TMT joints. There is no evidence of foot collapse noted either. There is evidence of calcaneal spurring noted and an increased in the left and right 1-2 IM angle noted. There is also an increased hallux valgus angle noted as well.  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 Right and left hallux valgus deformity Exam End: 10/10/22  Radiographs taken 3 views standing of the right and left foot reveal no fracture or dislocation. The forefoot and hindfoot joints are congruent. There is evidence of joint space narrowing noted affecting the 1st 2nd and 3rd TMT joints. There is no evidence of foot collapse noted either. There is evidence of calcaneal spurring noted and an increased in the left and right 1-2 IM angle noted. There is also an increased hallux valgus angle noted as well.  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 Right and left hallux valgus deformity Exam End: 10/10/22    Speciality Comments: No specialty comments available.  Procedures:  No procedures performed Allergies: Benadryl [diphenhydramine], Codeine, and Penicillins    Assessment / Plan:     Visit Diagnoses: 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 tophus -patient denies having a flare of gout.  She has been on allopurinol 300 mg p.o. daily without any interruption.  She did not have to take colchicine.  Dietary modifications were advised.  Low purine diet was discussed.  Uric acid: 3.6 on 06/27/2022.  Medication monitoring encounter -  Allopurinol 300 mg 1 tablet by mouth daily.  CBC and CMP were normal in February 2024.  She also had a normal CMP and June 2024.  She was advised to get repeat labs in October and then every 6 months.  Primary osteoarthritis of both hands-she continues to have some stiffness and discomfort in her hands.  Joint protection muscle strengthening was discussed.  Primary osteoarthritis of both knees-she has off-and-on discomfort in her knee joints.  No warmth swelling or effusion was noted.  Primary osteoarthritis of both feet-she has been asked Raynauds increased pain and discomfort in the bilateral feet.  She was evaluated by Dr. Romualdo Bolk at [facility].  I reviewed his notes.  He obtained x-rays of his bilateral feet which were reviewed.  She had bilateral tarsometatarsal joint arthritis and also bunions.  He recommended TMT joint injection in the future if her symptoms get worse.  Patient has brought new [place] shoes which are supportive and relieving some of the discomfort she was experiencing.  DDD (degenerative disc disease), cervical-she had recent flare of neck pain requiring physical therapy.  Her symptoms are improving.  Range of motion was good today.  Osteopenia of multiple sites - DEXA updated on 11/24/21: Left femoral neck BMD 0.796 with T-score -1.1.  Calcium rich diet and vitamin D was advised.  Need for regular exercise was advised.  History of hypertension-blood pressure was elevated at 146/88.  Repeat blood pressure was 150/81.  She was advised to monitor blood pressure closely and follow-up with her PCP.  She is  on amlodipine and carvedilol.  She also takes Lasix for pedal edema.  History of diabetes mellitus  History of hyperlipidemia-she is on Lipitor 20 mg p.o. daily.  History of humerus fracture  Orders: Orders Placed This Encounter  Procedures   CBC with Differential/Platelet   COMPLETE METABOLIC PANEL WITH GFR   Uric acid   No orders of the defined types were placed in this encounter.    Follow-Up Instructions: Return in about 6 months (around 05/17/2023) for Gout, Osteoarthritis.   Pollyann Savoy, MD  Note - This record has been created using Animal nutritionist.  Chart creation errors have been sought, but may not always  have been located. Such creation errors do not reflect on  the standard of medical care.

## 2022-11-05 NOTE — Assessment & Plan Note (Addendum)
Chronic, fair control. Goal BP<130/80.  She will continue with carvedilol 6.25mg  bid, olmesartan 40mg , amlodipine 10mg  and furosemide 20mg  daily. She is encouraged to follow a low sodium diet.

## 2022-11-05 NOTE — Assessment & Plan Note (Signed)
Chronic, she is now under the care of Dermatology, Dr. Onalee Hua.

## 2022-11-05 NOTE — Assessment & Plan Note (Signed)
She is encouraged to strive for BMI less than 30 to decrease cardiac risk. Advised to aim for at least 150 minutes of exercise per week.  

## 2022-11-05 NOTE — Assessment & Plan Note (Signed)
Previous labs reviewed, her A1c has been elevated in the past. I will check an A1c today. Reminded to avoid refined sugars including sugary drinks/foods and processed meats including bacon, sausages and deli meats.  

## 2022-11-05 NOTE — Assessment & Plan Note (Signed)
Chronic, she is encouraged to inquire about laser therapy. She may find relief in this procedure.

## 2022-11-14 ENCOUNTER — Encounter: Payer: Self-pay | Admitting: Rheumatology

## 2022-11-14 ENCOUNTER — Ambulatory Visit: Payer: Medicare HMO | Attending: Rheumatology | Admitting: Rheumatology

## 2022-11-14 VITALS — BP 150/81 | HR 74 | Resp 17 | Ht 65.5 in | Wt 231.8 lb

## 2022-11-14 DIAGNOSIS — M19071 Primary osteoarthritis, right ankle and foot: Secondary | ICD-10-CM

## 2022-11-14 DIAGNOSIS — M19072 Primary osteoarthritis, left ankle and foot: Secondary | ICD-10-CM

## 2022-11-14 DIAGNOSIS — M5136 Other intervertebral disc degeneration, lumbar region: Secondary | ICD-10-CM | POA: Diagnosis not present

## 2022-11-14 DIAGNOSIS — M8589 Other specified disorders of bone density and structure, multiple sites: Secondary | ICD-10-CM | POA: Diagnosis not present

## 2022-11-14 DIAGNOSIS — M19041 Primary osteoarthritis, right hand: Secondary | ICD-10-CM

## 2022-11-14 DIAGNOSIS — Z8639 Personal history of other endocrine, nutritional and metabolic disease: Secondary | ICD-10-CM

## 2022-11-14 DIAGNOSIS — M1A09X0 Idiopathic chronic gout, multiple sites, without tophus (tophi): Secondary | ICD-10-CM | POA: Diagnosis not present

## 2022-11-14 DIAGNOSIS — Z5181 Encounter for therapeutic drug level monitoring: Secondary | ICD-10-CM

## 2022-11-14 DIAGNOSIS — M170 Bilateral primary osteoarthritis of knee: Secondary | ICD-10-CM

## 2022-11-14 DIAGNOSIS — M5030 Other cervical disc degeneration, unspecified cervical region: Secondary | ICD-10-CM

## 2022-11-14 DIAGNOSIS — Z8679 Personal history of other diseases of the circulatory system: Secondary | ICD-10-CM | POA: Diagnosis not present

## 2022-11-14 DIAGNOSIS — M19042 Primary osteoarthritis, left hand: Secondary | ICD-10-CM

## 2022-11-14 DIAGNOSIS — Z8781 Personal history of (healed) traumatic fracture: Secondary | ICD-10-CM

## 2022-11-14 NOTE — Patient Instructions (Signed)
Standing Labs We placed an order today for your standing lab work.   Please have your standing labs drawn in October (CBC with differential, CMP with GFR and uric acid)  Please have your labs drawn 2 weeks prior to your appointment so that the provider can discuss your lab results at your appointment, if possible.  Please note that you may see your imaging and lab results in MyChart before we have reviewed them. We will contact you once all results are reviewed. Please allow our office up to 72 hours to thoroughly review all of the results before contacting the office for clarification of your results.  WALK-IN LAB HOURS  Monday through Thursday from 8:00 am -12:30 pm and 1:00 pm-5:00 pm and Friday from 8:00 am-12:00 pm.  Patients with office visits requiring labs will be seen before walk-in labs.  You may encounter longer than normal wait times. Please allow additional time. Wait times may be shorter on  Monday and Thursday afternoons.  We do not book appointments for walk-in labs. We appreciate your patience and understanding with our staff.   Labs are drawn by [facility]. Please bring your co-pay at the time of your lab draw.  You may receive a bill from [facility] for your lab work.  Please note if you are on Hydroxychloroquine and and an order has been placed for a Hydroxychloroquine level,  you will need to have it drawn 4 hours or more after your last dose.  If you wish to have your labs drawn at another location, please call the office 24 hours in advance so we can fax the orders.  The office is located at [address], [address], [place], [place] [address]   If you have any questions regarding directions or hours of operation,  please call [phone].   As a reminder, please drink plenty of water prior to coming for your lab work. Thanks!

## 2022-11-15 DIAGNOSIS — G4733 Obstructive sleep apnea (adult) (pediatric): Secondary | ICD-10-CM | POA: Diagnosis not present

## 2022-12-01 DIAGNOSIS — R69 Illness, unspecified: Secondary | ICD-10-CM | POA: Diagnosis not present

## 2022-12-14 DIAGNOSIS — H34832 Tributary (branch) retinal vein occlusion, left eye, with macular edema: Secondary | ICD-10-CM | POA: Diagnosis not present

## 2022-12-14 DIAGNOSIS — H35033 Hypertensive retinopathy, bilateral: Secondary | ICD-10-CM | POA: Diagnosis not present

## 2022-12-14 DIAGNOSIS — H2513 Age-related nuclear cataract, bilateral: Secondary | ICD-10-CM | POA: Diagnosis not present

## 2022-12-14 DIAGNOSIS — H43813 Vitreous degeneration, bilateral: Secondary | ICD-10-CM | POA: Diagnosis not present

## 2022-12-16 DIAGNOSIS — G4733 Obstructive sleep apnea (adult) (pediatric): Secondary | ICD-10-CM | POA: Diagnosis not present

## 2022-12-18 ENCOUNTER — Encounter: Payer: Self-pay | Admitting: Internal Medicine

## 2022-12-24 ENCOUNTER — Other Ambulatory Visit: Payer: Self-pay | Admitting: Physician Assistant

## 2022-12-25 NOTE — Telephone Encounter (Signed)
Last Fill: 09/27/2022  Labs: 06/27/2022  Uric Acid 3.1 - 7.9 mg/dL 3.6   7/82/9562 CMP normal,  06/27/2022 CBC MCH 26.0   Next Visit: 05/17/2023  Last Visit: 716/2024  DX:  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 tophus   Current Dose per office note 11/14/2022: Allopurinol 300 mg 1 tablet by mouth daily   Okay to refill Allopurinol?

## 2022-12-27 DIAGNOSIS — M66871 Spontaneous rupture of other tendons, right ankle and foot: Secondary | ICD-10-CM | POA: Diagnosis not present

## 2022-12-27 DIAGNOSIS — M2142 Flat foot [pes planus] (acquired), left foot: Secondary | ICD-10-CM | POA: Diagnosis not present

## 2022-12-27 DIAGNOSIS — M2141 Flat foot [pes planus] (acquired), right foot: Secondary | ICD-10-CM | POA: Diagnosis not present

## 2022-12-28 ENCOUNTER — Other Ambulatory Visit: Payer: Self-pay | Admitting: Internal Medicine

## 2022-12-28 DIAGNOSIS — I10 Essential (primary) hypertension: Secondary | ICD-10-CM | POA: Diagnosis not present

## 2022-12-28 DIAGNOSIS — Z86010 Personal history of colonic polyps: Secondary | ICD-10-CM | POA: Diagnosis not present

## 2022-12-28 DIAGNOSIS — E782 Mixed hyperlipidemia: Secondary | ICD-10-CM | POA: Diagnosis not present

## 2022-12-28 DIAGNOSIS — E0590 Thyrotoxicosis, unspecified without thyrotoxic crisis or storm: Secondary | ICD-10-CM

## 2022-12-28 DIAGNOSIS — K5730 Diverticulosis of large intestine without perforation or abscess without bleeding: Secondary | ICD-10-CM | POA: Diagnosis not present

## 2022-12-28 DIAGNOSIS — Z1211 Encounter for screening for malignant neoplasm of colon: Secondary | ICD-10-CM | POA: Diagnosis not present

## 2022-12-28 DIAGNOSIS — R7303 Prediabetes: Secondary | ICD-10-CM

## 2022-12-31 ENCOUNTER — Encounter: Payer: Self-pay | Admitting: Internal Medicine

## 2023-01-04 DIAGNOSIS — M66871 Spontaneous rupture of other tendons, right ankle and foot: Secondary | ICD-10-CM | POA: Diagnosis not present

## 2023-01-04 DIAGNOSIS — M19071 Primary osteoarthritis, right ankle and foot: Secondary | ICD-10-CM | POA: Diagnosis not present

## 2023-01-04 DIAGNOSIS — M2141 Flat foot [pes planus] (acquired), right foot: Secondary | ICD-10-CM | POA: Diagnosis not present

## 2023-01-04 DIAGNOSIS — M25571 Pain in right ankle and joints of right foot: Secondary | ICD-10-CM | POA: Diagnosis not present

## 2023-01-04 DIAGNOSIS — M2142 Flat foot [pes planus] (acquired), left foot: Secondary | ICD-10-CM | POA: Diagnosis not present

## 2023-01-08 DIAGNOSIS — G4733 Obstructive sleep apnea (adult) (pediatric): Secondary | ICD-10-CM | POA: Diagnosis not present

## 2023-01-09 MED ORDER — COLCHICINE 0.6 MG PO TABS: ORAL_TABLET | ORAL | 1 refills | Status: DC

## 2023-01-09 NOTE — Telephone Encounter (Signed)
Patient is on carvedilol.  Please reduce the dose of colchicine to 0.3 mg p.o. twice daily for 3 days for the gout flare and then she should go to 0.3 mg p.o. daily as needed.

## 2023-01-09 NOTE — Telephone Encounter (Signed)
We can send a prescription for colchicine 0.6 mg p.o. twice daily as needed patient can take colchicine twice a day until the symptoms improve and then reduce it to once a day as needed.  Please send a prescription for colchicine 0.6 mg p.o. twice daily as needed total 60 with 1 refill.  It is better to wait about 2 weeks after the gout flare to get uric acid level.  Uric acid levels are usually low at the time of gout flare.

## 2023-01-11 ENCOUNTER — Other Ambulatory Visit: Payer: Self-pay | Admitting: Pharmacist

## 2023-01-11 NOTE — Progress Notes (Signed)
   01/11/2023  Patient ID: Robin Odom, female   DOB: [birth year], 72 y.o.   MRN: [id number]  Patient appearing on report for [employer] - Hypertension Control report due to last documented ambulatory blood pressure of 150/81 on 11/14/22. Next appointment with PCP is 02/20/23.   Outreached patient to discuss hypertension control and medication management.   Current antihypertensives: Carvedilol, Olmesartan, Amlodipine  Patient has an automated upper arm home BP machine.  Current blood pressure readings: 132/82  Patient denies hypotensive signs and symptoms including dizziness, lightheadedness.  Patient denies hypertensive symptoms including headache, chest pain, shortness of breath.  Patient denies side effects related to medications.    Assessment/Plan: - Currently controlled - - Reviewed goal blood pressure <130/80  *Of note, having cold symptoms (on Day 2)- hoping it's just a cold and not COVID  Marlowe Aschoff, PharmD [facility] Phone Number: [phone]

## 2023-01-12 ENCOUNTER — Other Ambulatory Visit: Payer: Self-pay | Admitting: Internal Medicine

## 2023-01-12 DIAGNOSIS — I10 Essential (primary) hypertension: Secondary | ICD-10-CM

## 2023-01-14 ENCOUNTER — Other Ambulatory Visit (HOSPITAL_BASED_OUTPATIENT_CLINIC_OR_DEPARTMENT_OTHER): Payer: Self-pay | Admitting: Cardiology

## 2023-01-14 DIAGNOSIS — I10 Essential (primary) hypertension: Secondary | ICD-10-CM

## 2023-01-15 ENCOUNTER — Encounter (HOSPITAL_BASED_OUTPATIENT_CLINIC_OR_DEPARTMENT_OTHER): Payer: Self-pay

## 2023-01-15 DIAGNOSIS — Z5181 Encounter for therapeutic drug level monitoring: Secondary | ICD-10-CM

## 2023-01-15 DIAGNOSIS — I10 Essential (primary) hypertension: Secondary | ICD-10-CM

## 2023-01-16 ENCOUNTER — Other Ambulatory Visit: Payer: Self-pay | Admitting: Rheumatology

## 2023-01-16 DIAGNOSIS — G4733 Obstructive sleep apnea (adult) (pediatric): Secondary | ICD-10-CM | POA: Diagnosis not present

## 2023-01-22 MED ORDER — SPIRONOLACTONE 25 MG PO TABS: ORAL_TABLET | ORAL | 5 refills | Status: DC

## 2023-01-26 DIAGNOSIS — M76821 Posterior tibial tendinitis, right leg: Secondary | ICD-10-CM | POA: Insufficient documentation

## 2023-01-26 DIAGNOSIS — M79671 Pain in right foot: Secondary | ICD-10-CM | POA: Diagnosis not present

## 2023-01-26 DIAGNOSIS — G8929 Other chronic pain: Secondary | ICD-10-CM | POA: Diagnosis not present

## 2023-01-31 ENCOUNTER — Encounter: Payer: Self-pay | Admitting: Internal Medicine

## 2023-02-01 ENCOUNTER — Other Ambulatory Visit: Payer: Self-pay | Admitting: Internal Medicine

## 2023-02-01 DIAGNOSIS — Z1231 Encounter for screening mammogram for malignant neoplasm of breast: Secondary | ICD-10-CM

## 2023-02-05 ENCOUNTER — Telehealth: Payer: Self-pay | Admitting: *Deleted

## 2023-02-05 NOTE — Telephone Encounter (Signed)
Patient left message requesting the lab hours for tomorrow.   Attempted to contact the patient and left message for patient to advise of lab hours.

## 2023-02-06 ENCOUNTER — Other Ambulatory Visit: Payer: Self-pay | Admitting: *Deleted

## 2023-02-06 DIAGNOSIS — Z5181 Encounter for therapeutic drug level monitoring: Secondary | ICD-10-CM | POA: Diagnosis not present

## 2023-02-06 DIAGNOSIS — M1A09X0 Idiopathic chronic gout, multiple sites, without tophus (tophi): Secondary | ICD-10-CM

## 2023-02-07 LAB — CBC WITH DIFFERENTIAL/PLATELET
Absolute Monocytes: 721 {cells}/uL (ref 200–950)
Basophils Absolute: 49 {cells}/uL (ref 0–200)
Basophils Relative: 0.6 %
Eosinophils Absolute: 203 {cells}/uL (ref 15–500)
Eosinophils Relative: 2.5 %
HCT: 41.7 % (ref 35.0–45.0)
Hemoglobin: 13.3 g/dL (ref 11.7–15.5)
Lymphs Abs: 2503 {cells}/uL (ref 850–3900)
MCH: 26.7 pg — ABNORMAL LOW (ref 27.0–33.0)
MCHC: 31.9 g/dL — ABNORMAL LOW (ref 32.0–36.0)
MCV: 83.6 fL (ref 80.0–100.0)
MPV: 10.3 fL (ref 7.5–12.5)
Monocytes Relative: 8.9 %
Neutro Abs: 4625 {cells}/uL (ref 1500–7800)
Neutrophils Relative %: 57.1 %
Platelets: 303 10*3/uL (ref 140–400)
RBC: 4.99 10*6/uL (ref 3.80–5.10)
RDW: 14.7 % (ref 11.0–15.0)
Total Lymphocyte: 30.9 %
WBC: 8.1 10*3/uL (ref 3.8–10.8)

## 2023-02-07 LAB — COMPLETE METABOLIC PANEL WITH GFR
AG Ratio: 1.4 (calc) (ref 1.0–2.5)
ALT: 14 U/L (ref 6–29)
AST: 19 U/L (ref 10–35)
Albumin: 4.3 g/dL (ref 3.6–5.1)
Alkaline phosphatase (APISO): 71 U/L (ref 37–153)
BUN: 23 mg/dL (ref 7–25)
CO2: 30 mmol/L (ref 20–32)
Calcium: 10.1 mg/dL (ref 8.6–10.4)
Chloride: 105 mmol/L (ref 98–110)
Creat: 0.84 mg/dL (ref 0.60–1.00)
Globulin: 3 g/dL (ref 1.9–3.7)
Glucose, Bld: 95 mg/dL (ref 65–99)
Potassium: 4.6 mmol/L (ref 3.5–5.3)
Sodium: 141 mmol/L (ref 135–146)
Total Bilirubin: 0.6 mg/dL (ref 0.2–1.2)
Total Protein: 7.3 g/dL (ref 6.1–8.1)
eGFR: 73 mL/min/{1.73_m2} (ref >=60)

## 2023-02-07 LAB — URIC ACID: Uric Acid, Serum: 3.8 mg/dL (ref 2.5–7.0)

## 2023-02-07 NOTE — Progress Notes (Signed)
CBC and CMP are normal.  Uric acid is in the desirable range.

## 2023-02-12 ENCOUNTER — Encounter: Payer: Self-pay | Admitting: Internal Medicine

## 2023-02-12 DIAGNOSIS — M76821 Posterior tibial tendinitis, right leg: Secondary | ICD-10-CM | POA: Diagnosis not present

## 2023-02-12 DIAGNOSIS — M6281 Muscle weakness (generalized): Secondary | ICD-10-CM | POA: Diagnosis not present

## 2023-02-12 DIAGNOSIS — R262 Difficulty in walking, not elsewhere classified: Secondary | ICD-10-CM | POA: Diagnosis not present

## 2023-02-15 DIAGNOSIS — G4733 Obstructive sleep apnea (adult) (pediatric): Secondary | ICD-10-CM | POA: Diagnosis not present

## 2023-02-19 DIAGNOSIS — M5412 Radiculopathy, cervical region: Secondary | ICD-10-CM | POA: Diagnosis not present

## 2023-02-19 DIAGNOSIS — R6889 Other general symptoms and signs: Secondary | ICD-10-CM | POA: Diagnosis not present

## 2023-02-19 DIAGNOSIS — R262 Difficulty in walking, not elsewhere classified: Secondary | ICD-10-CM | POA: Diagnosis not present

## 2023-02-19 DIAGNOSIS — M6281 Muscle weakness (generalized): Secondary | ICD-10-CM | POA: Diagnosis not present

## 2023-02-19 DIAGNOSIS — M76821 Posterior tibial tendinitis, right leg: Secondary | ICD-10-CM | POA: Diagnosis not present

## 2023-02-20 ENCOUNTER — Ambulatory Visit (INDEPENDENT_AMBULATORY_CARE_PROVIDER_SITE_OTHER): Payer: Medicare HMO | Admitting: Internal Medicine

## 2023-02-20 ENCOUNTER — Encounter: Payer: Self-pay | Admitting: Internal Medicine

## 2023-02-20 VITALS — BP 120/84 | HR 70 | Temp 98.5°F | Ht 64.4 in | Wt 220.6 lb

## 2023-02-20 DIAGNOSIS — Z6837 Body mass index (BMI) 37.0-37.9, adult: Secondary | ICD-10-CM

## 2023-02-20 DIAGNOSIS — Z79899 Other long term (current) drug therapy: Secondary | ICD-10-CM

## 2023-02-20 DIAGNOSIS — E0590 Thyrotoxicosis, unspecified without thyrotoxic crisis or storm: Secondary | ICD-10-CM | POA: Diagnosis not present

## 2023-02-20 DIAGNOSIS — R7303 Prediabetes: Secondary | ICD-10-CM

## 2023-02-20 DIAGNOSIS — I10 Essential (primary) hypertension: Secondary | ICD-10-CM | POA: Diagnosis not present

## 2023-02-20 DIAGNOSIS — M25561 Pain in right knee: Secondary | ICD-10-CM | POA: Diagnosis not present

## 2023-02-20 DIAGNOSIS — Z0000 Encounter for general adult medical examination without abnormal findings: Secondary | ICD-10-CM | POA: Diagnosis not present

## 2023-02-20 DIAGNOSIS — E66812 Obesity, class 2: Secondary | ICD-10-CM | POA: Diagnosis not present

## 2023-02-20 DIAGNOSIS — M25562 Pain in left knee: Secondary | ICD-10-CM | POA: Diagnosis not present

## 2023-02-20 DIAGNOSIS — G8929 Other chronic pain: Secondary | ICD-10-CM | POA: Diagnosis not present

## 2023-02-20 DIAGNOSIS — E7800 Pure hypercholesterolemia, unspecified: Secondary | ICD-10-CM

## 2023-02-20 MED ORDER — TRIAMCINOLONE ACETONIDE 0.1 % EX CREA: TOPICAL_CREAM | CUTANEOUS | 0 refills | Status: DC

## 2023-02-20 NOTE — Progress Notes (Signed)
I,Jameka J Llittleton, CMA,acting as a Neurosurgeon for Gwynneth Aliment, MD.,have documented all relevant documentation on the behalf of Gwynneth Aliment, MD,as directed by  Gwynneth Aliment, MD while in the presence of Gwynneth Aliment, MD.  Subjective:    Patient ID: Robin Odom , female    DOB: 17-Nov-1950 , 72 y.o.   MRN: [id number]  Chief Complaint  Patient presents with   Annual Exam   Hypertension    HPI  Patient presents today for hm. She is still followed by GYN for her pelvic exams. She reports compliance with meds. She denies headaches, chest pain and shortness of breath. Patient is scheduled for a colonscopy on 03/12/23 with [facility]. She would like to discuss her gout flares. She also reports she has a burning sensation in her toe. She stated she shouldn't be having a gout attack. She would like a have a referral done to a different orthopaedic surgeon. She would like to establish care with a orthopaedic before her knee starts giving her a lot of problems.   Hypertension This is a chronic problem. The current episode started more than 1 year ago. The problem has been gradually improving since onset. The problem is uncontrolled. Pertinent negatives include no blurred vision. Risk factors for coronary artery disease include obesity, post-menopausal state and sedentary lifestyle. Past treatments include angiotensin blockers and calcium channel blockers. The current treatment provides moderate improvement. Compliance problems include exercise.      Past Medical History:  Diagnosis Date   Arthritis    Diabetes mellitus without complication (HCC)    Gout    Hypertension    Partial retinal tear of both eyes without detachment 08/29/2017     Family History  Problem Relation Age of Onset   Heart disease Mother    Hypertension Mother    Diabetes Mother    Arthritis Father    Hypertension Father    Prostate cancer Father    Parkinson's disease Father    Heart disease Father     Sinusitis Sister    Allergic rhinitis Sister    Diabetes Sister    High Cholesterol Sister    Hypertension Sister    Diabetes Brother    Hypertension Brother    High Cholesterol Brother    Hypertension Brother    Angioedema Neg Hx    Asthma Neg Hx    Eczema Neg Hx    Immunodeficiency Neg Hx    Urticaria Neg Hx    Breast cancer Neg Hx      Current Outpatient Medications:    allopurinol (ZYLOPRIM) 300 MG tablet, TAKE 1 TABLET BY MOUTH EVERY DAY, Disp: 90 tablet, Rfl: 0   amLODipine (NORVASC) 10 MG tablet, TAKE 1 TABLET BY MOUTH EVERY DAY, Disp: 90 tablet, Rfl: 1   Ascorbic Acid (VITAMIN C PO), Take 1,500 mg by mouth daily at 6 (six) AM., Disp: , Rfl:    atorvastatin (LIPITOR) 20 MG tablet, Take 1 tablet (20 mg total) by mouth daily., Disp: 30 tablet, Rfl: 11   B Complex Vitamins (VITAMIN-B COMPLEX) TABS, Take by mouth., Disp: , Rfl:    BENFOTIAMINE PO, Take by mouth 2 (two) times daily., Disp: , Rfl:    Calcium Carb-Cholecalciferol 500-10 MG-MCG TABS, Take 1 tablet by mouth daily., Disp: , Rfl:    carvedilol (COREG) 6.25 MG tablet, TAKE 1 TABLET BY MOUTH TWICE A DAY, Disp: 180 tablet, Rfl: 2   Cholecalciferol (VITAMIN D3) 125 MCG (5000 UT) CAPS, Take  by mouth. , Disp: , Rfl:    colchicine 0.6 MG tablet, Take 0.6 mg by mouth as needed., Disp: , Rfl:    colchicine 0.6 MG tablet, Take 0.3 mg by mouth twice daily for 3 days for gout flare. Then take 0.3 mg by mouth daily as needed., Disp: 30 tablet, Rfl: 1   Cyanocobalamin (B-12) 5000 MCG CAPS, Take 1 capsule by mouth daily at 12 noon., Disp: , Rfl:    furosemide (LASIX) 20 MG tablet, TAKE 1 TABLET BY MOUTH EVERY DAY, Disp: 90 tablet, Rfl: 1   magnesium oxide (MAG-OX) 400 MG tablet, Take 400 mg by mouth daily., Disp: , Rfl:    melatonin 3 MG TABS tablet, Take 3 mg by mouth at bedtime., Disp: , Rfl:    olmesartan (BENICAR) 40 MG tablet, TAKE 1 TABLET BY MOUTH EVERY DAY, Disp: 90 tablet, Rfl: 2   Omega-3 Fatty Acids (OMEGA 3 PO), Take  2,500 mg by mouth daily. 2 tablets by mouth daily, Disp: , Rfl:    potassium chloride (MICRO-K) 10 MEQ CR capsule, TAKE 1 CAPSULE BY MOUTH EVERY DAY, Disp: 90 capsule, Rfl: 1   TURMERIC CURCUMIN PO, Take by mouth daily., Disp: , Rfl:    spironolactone (ALDACTONE) 25 MG tablet, Take 1 tablet (25 mg total) by mouth daily., Disp: 90 tablet, Rfl: 3   triamcinolone cream (KENALOG) 0.1 %, APPLY TO AFFECTED AREA TWICE DAILY AS NEEDED, Disp: 45 g, Rfl: 0   Allergies  Allergen Reactions   Benadryl [Diphenhydramine] Other (See Comments)    Temporary Amnesia    Codeine Other (See Comments)    Hallucinations   Penicillins Hives      The patient states she uses post menopausal status for birth control. No LMP recorded. Patient has had a hysterectomy.. Negative for Dysmenorrhea. Negative for: breast discharge, breast lump(s), breast pain and breast self exam. Associated symptoms include abnormal vaginal bleeding. Pertinent negatives include abnormal bleeding (hematology), anxiety, decreased libido, depression, difficulty falling sleep, dyspareunia, history of infertility, nocturia, sexual dysfunction, sleep disturbances, urinary incontinence, urinary urgency, vaginal discharge and vaginal itching. Diet regular.The patient states her exercise level is  moderate.  . The patient's tobacco use is:  Social History   Tobacco Use  Smoking Status Never   Passive exposure: Never  Smokeless Tobacco Never  . She has been exposed to passive smoke. The patient's alcohol use is:  Social History   Substance and Sexual Activity  Alcohol Use Yes   Comment: rarely    Review of Systems  Constitutional: Negative.   HENT: Negative.    Eyes: Negative.  Negative for blurred vision.  Respiratory: Negative.    Cardiovascular: Negative.   Gastrointestinal: Negative.   Endocrine: Negative.   Genitourinary: Negative.   Musculoskeletal:  Positive for arthralgias.       She c/o worsening b/l knee pain. She wants to  see Ortho. Has a recommendation. Wants to call back with info.   Skin: Negative.   Allergic/Immunologic: Negative.   Neurological: Negative.   Hematological: Negative.   Psychiatric/Behavioral: Negative.       Today's Vitals   02/20/23 1058  BP: 120/84  Pulse: 70  Temp: 98.5 F (36.9 C)  Weight: 220 lb 9.6 oz (100.1 kg)  Height: 5' 4.4" (1.636 m)  PainSc: 5   PainLoc: Foot   Body mass index is 37.4 kg/m.  Wt Readings from Last 3 Encounters:  02/20/23 220 lb 9.6 oz (100.1 kg)  11/14/22 231 lb 12.8 oz (105.1 kg)  10/26/22 228 lb 9.6 oz (103.7 kg)     Objective:  Physical Exam Vitals and nursing note reviewed.  Constitutional:      Appearance: Normal appearance. She is obese.  HENT:     Head: Normocephalic and atraumatic.     Right Ear: Tympanic membrane, ear canal and external ear normal.     Left Ear: Tympanic membrane, ear canal and external ear normal.     Nose:     Comments: Masked     Mouth/Throat:     Comments: Masked  Eyes:     Extraocular Movements: Extraocular movements intact.     Conjunctiva/sclera: Conjunctivae normal.     Pupils: Pupils are equal, round, and reactive to light.  Cardiovascular:     Rate and Rhythm: Normal rate and regular rhythm.     Pulses: Normal pulses.     Heart sounds: Normal heart sounds.  Pulmonary:     Effort: Pulmonary effort is normal.     Breath sounds: Normal breath sounds.  Chest:  Breasts:    Tanner Score is 5.     Comments: She kept bra on Abdominal:     General: Bowel sounds are normal.     Palpations: Abdomen is soft.     Comments: Obese, soft. Difficult to assess organomegaly.   Genitourinary:    Comments: deferred Musculoskeletal:        General: No tenderness or signs of injury. Normal range of motion.     Cervical back: Normal range of motion and neck supple.     Comments: Neg squeeze test b/l  Skin:    General: Skin is warm and dry.  Neurological:     General: No focal deficit present.     Mental  Status: She is alert and oriented to person, place, and time.  Psychiatric:        Mood and Affect: Mood normal.        Behavior: Behavior normal.         Assessment And Plan:     Encounter for general adult medical examination w/o abnormal findings Assessment & Plan: A full exam was performed.  Importance of monthly self breast exams was discussed with the patient.  She is advised to get 30-45 minutes of regular exercise, no less than four to five days per week. Both weight-bearing and aerobic exercises are recommended.  She is advised to follow a healthy diet with at least six fruits/veggies per day, decrease intake of red meat and other saturated fats and to increase fish intake to twice weekly.  Meats/fish should not be fried -- baked, boiled or broiled is preferable. It is also important to cut back on your sugar intake.  Be sure to read labels - try to avoid anything with added sugar, high fructose corn syrup or other sweeteners.  If you must use a sweetener, you can try stevia or monkfruit.  It is also important to avoid artificially sweetened foods/beverages and diet drinks. Lastly, wear SPF 50 sunscreen on exposed skin and when in direct sunlight for an extended period of time.  Be sure to avoid fast food restaurants and aim for at least 60 ounces of water daily.       Essential hypertension, benign Assessment & Plan: Chronic, fair control. Goal BP<130/80.  She will continue with carvedilol 6.25mg  bid, olmesartan 40mg , amlodipine 10mg  and furosemide 20mg  daily. She is encouraged to follow a low sodium diet. She will f/u in four to six months for re-evaluation.  Bilateral chronic knee pain -     Ambulatory referral to Orthopedic Surgery  Pure hypercholesterolemia Assessment & Plan: Chronic, she is currently on atorvastatin 20mg  daily. Encouraged to follow heart healthy lifestyle.   Orders: -     Lipid panel  Subclinical hyperthyroidism -     TSH + free T4 -      Thyroglobulin antibody  Prediabetes Assessment & Plan: Previous labs reviewed, her A1c has been elevated in the past. I will check an A1c today. Reminded to avoid refined sugars including sugary drinks/foods and processed meats including bacon, sausages and deli meats.  She is currently on Rybelsus to address insulin resistance.   Orders: -     Hemoglobin A1c  Class 2 severe obesity due to excess calories with serious comorbidity and body mass index (BMI) of 37.0 to 37.9 in adult [facility]) Assessment & Plan: She is encouraged to strive for BMI less than 30 to decrease cardiac risk. Advised to aim for at least 150 minutes of exercise per week.    Drug therapy -     Vitamin B12  Other orders -     Triamcinolone Acetonide; APPLY TO AFFECTED AREA TWICE DAILY AS NEEDED  Dispense: 45 g; Refill: 0     Return for 1 year physical. Patient was given opportunity to ask questions. Patient verbalized understanding of the plan and was able to repeat key elements of the plan. All questions were answered to their satisfaction.    I, Gwynneth Aliment, MD, have reviewed all documentation for this visit. The documentation on 02/20/23 for the exam, diagnosis, procedures, and orders are all accurate and complete.

## 2023-02-20 NOTE — Patient Instructions (Signed)

## 2023-02-21 LAB — HEMOGLOBIN A1C
Est. average glucose Bld gHb Est-mCnc: 128 mg/dL
Hgb A1c MFr Bld: 6.1 % — ABNORMAL HIGH (ref 4.8–5.6)

## 2023-02-21 LAB — LIPID PANEL
Chol/HDL Ratio: 2.4 ratio (ref 0.0–4.4)
Cholesterol, Total: 128 mg/dL (ref 100–199)
HDL: 54 mg/dL (ref >39)
LDL Chol Calc (NIH): 63 mg/dL (ref 0–99)
Triglycerides: 49 mg/dL (ref 0–149)
VLDL Cholesterol Cal: 11 mg/dL (ref 5–40)

## 2023-02-21 LAB — TSH+FREE T4
Free T4: 1.66 ng/dL (ref 0.82–1.77)
TSH: 0.513 u[IU]/mL (ref 0.450–4.500)

## 2023-02-21 LAB — THYROGLOBULIN ANTIBODY: Thyroglobulin Antibody: <1 [IU]/mL (ref 0.0–0.9)

## 2023-02-21 LAB — VITAMIN B12: Vitamin B-12: >2000 pg/mL — ABNORMAL HIGH (ref 232–1245)

## 2023-02-22 MED ORDER — SPIRONOLACTONE 25 MG PO TABS: 25.0000 mg | ORAL_TABLET | Freq: Every day | ORAL | 3 refills | Status: DC

## 2023-02-22 NOTE — Telephone Encounter (Signed)
Please review BP and advise should continue whole tablet of spiro or switch to half as prescribed

## 2023-02-22 NOTE — Telephone Encounter (Signed)
BP looks good. Recent labs with  normal kidney function and potassium. May continue Spironolactone 25mg  daily.   Alver Sorrow, NP

## 2023-02-26 DIAGNOSIS — M5412 Radiculopathy, cervical region: Secondary | ICD-10-CM | POA: Diagnosis not present

## 2023-02-26 DIAGNOSIS — M76821 Posterior tibial tendinitis, right leg: Secondary | ICD-10-CM | POA: Diagnosis not present

## 2023-02-26 DIAGNOSIS — R6889 Other general symptoms and signs: Secondary | ICD-10-CM | POA: Diagnosis not present

## 2023-02-26 DIAGNOSIS — M6281 Muscle weakness (generalized): Secondary | ICD-10-CM | POA: Diagnosis not present

## 2023-02-26 DIAGNOSIS — R262 Difficulty in walking, not elsewhere classified: Secondary | ICD-10-CM | POA: Diagnosis not present

## 2023-02-28 DIAGNOSIS — G8929 Other chronic pain: Secondary | ICD-10-CM | POA: Insufficient documentation

## 2023-02-28 NOTE — Assessment & Plan Note (Signed)
Chronic, she is currently on atorvastatin 20mg  daily. Encouraged to follow heart healthy lifestyle.

## 2023-02-28 NOTE — Assessment & Plan Note (Signed)

## 2023-02-28 NOTE — Assessment & Plan Note (Addendum)
Chronic, fair control. Goal BP<130/80.  She will continue with carvedilol 6.25mg  bid, olmesartan 40mg , amlodipine 10mg  and furosemide 20mg  daily. She is encouraged to follow a low sodium diet. She will f/u in four to six months for re-evaluation.

## 2023-02-28 NOTE — Assessment & Plan Note (Signed)
She is encouraged to strive for BMI less than 30 to decrease cardiac risk. Advised to aim for at least 150 minutes of exercise per week.

## 2023-02-28 NOTE — Assessment & Plan Note (Signed)
Previous labs reviewed, her A1c has been elevated in the past. I will check an A1c today. Reminded to avoid refined sugars including sugary drinks/foods and processed meats including bacon, sausages and deli meats.  She is currently on Rybelsus to address insulin resistance.

## 2023-03-01 ENCOUNTER — Ambulatory Visit
Admission: RE | Admit: 2023-03-01 | Discharge: 2023-03-01 | Disposition: A | Discharge status: Home / Self Care | Payer: Medicare HMO | Source: Ambulatory Visit | Attending: Internal Medicine | Admitting: Internal Medicine

## 2023-03-01 DIAGNOSIS — M76821 Posterior tibial tendinitis, right leg: Secondary | ICD-10-CM | POA: Diagnosis not present

## 2023-03-01 DIAGNOSIS — Z1231 Encounter for screening mammogram for malignant neoplasm of breast: Secondary | ICD-10-CM

## 2023-03-01 DIAGNOSIS — M6281 Muscle weakness (generalized): Secondary | ICD-10-CM | POA: Diagnosis not present

## 2023-03-01 DIAGNOSIS — R262 Difficulty in walking, not elsewhere classified: Secondary | ICD-10-CM | POA: Diagnosis not present

## 2023-03-05 DIAGNOSIS — M6281 Muscle weakness (generalized): Secondary | ICD-10-CM | POA: Diagnosis not present

## 2023-03-05 DIAGNOSIS — R262 Difficulty in walking, not elsewhere classified: Secondary | ICD-10-CM | POA: Diagnosis not present

## 2023-03-05 DIAGNOSIS — M76821 Posterior tibial tendinitis, right leg: Secondary | ICD-10-CM | POA: Diagnosis not present

## 2023-03-08 DIAGNOSIS — M76821 Posterior tibial tendinitis, right leg: Secondary | ICD-10-CM | POA: Diagnosis not present

## 2023-03-08 DIAGNOSIS — R262 Difficulty in walking, not elsewhere classified: Secondary | ICD-10-CM | POA: Diagnosis not present

## 2023-03-08 DIAGNOSIS — M6281 Muscle weakness (generalized): Secondary | ICD-10-CM | POA: Diagnosis not present

## 2023-03-12 DIAGNOSIS — R262 Difficulty in walking, not elsewhere classified: Secondary | ICD-10-CM | POA: Diagnosis not present

## 2023-03-12 DIAGNOSIS — M6281 Muscle weakness (generalized): Secondary | ICD-10-CM | POA: Diagnosis not present

## 2023-03-12 DIAGNOSIS — M76821 Posterior tibial tendinitis, right leg: Secondary | ICD-10-CM | POA: Diagnosis not present

## 2023-03-13 DIAGNOSIS — M76821 Posterior tibial tendinitis, right leg: Secondary | ICD-10-CM | POA: Diagnosis not present

## 2023-03-13 DIAGNOSIS — M2142 Flat foot [pes planus] (acquired), left foot: Secondary | ICD-10-CM | POA: Diagnosis not present

## 2023-03-13 DIAGNOSIS — M2141 Flat foot [pes planus] (acquired), right foot: Secondary | ICD-10-CM | POA: Diagnosis not present

## 2023-03-15 DIAGNOSIS — M76821 Posterior tibial tendinitis, right leg: Secondary | ICD-10-CM | POA: Diagnosis not present

## 2023-03-15 DIAGNOSIS — M6281 Muscle weakness (generalized): Secondary | ICD-10-CM | POA: Diagnosis not present

## 2023-03-15 DIAGNOSIS — R262 Difficulty in walking, not elsewhere classified: Secondary | ICD-10-CM | POA: Diagnosis not present

## 2023-03-18 DIAGNOSIS — G4733 Obstructive sleep apnea (adult) (pediatric): Secondary | ICD-10-CM | POA: Diagnosis not present

## 2023-03-19 DIAGNOSIS — M76821 Posterior tibial tendinitis, right leg: Secondary | ICD-10-CM | POA: Diagnosis not present

## 2023-03-19 DIAGNOSIS — M6281 Muscle weakness (generalized): Secondary | ICD-10-CM | POA: Diagnosis not present

## 2023-03-19 DIAGNOSIS — R262 Difficulty in walking, not elsewhere classified: Secondary | ICD-10-CM | POA: Diagnosis not present

## 2023-03-22 DIAGNOSIS — R262 Difficulty in walking, not elsewhere classified: Secondary | ICD-10-CM | POA: Diagnosis not present

## 2023-03-22 DIAGNOSIS — M6281 Muscle weakness (generalized): Secondary | ICD-10-CM | POA: Diagnosis not present

## 2023-03-22 DIAGNOSIS — M76821 Posterior tibial tendinitis, right leg: Secondary | ICD-10-CM | POA: Diagnosis not present

## 2023-03-25 ENCOUNTER — Other Ambulatory Visit: Payer: Self-pay | Admitting: Physician Assistant

## 2023-03-26 NOTE — Telephone Encounter (Signed)
Last Fill: 12/25/2022  Labs: 02/06/2023 CBC and CMP are normal.  Uric acid is in the desirable range.   Next Visit: 05/17/2023  Last Visit: 11/14/2022  DX: 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 tophus   Current Dose per office note 11/14/2022: Allopurinol 300 mg 1 tablet by mouth daily   Okay to refill Allopurinol?

## 2023-04-09 ENCOUNTER — Other Ambulatory Visit (HOSPITAL_BASED_OUTPATIENT_CLINIC_OR_DEPARTMENT_OTHER): Payer: Self-pay

## 2023-04-09 ENCOUNTER — Encounter: Payer: Self-pay | Admitting: Internal Medicine

## 2023-04-09 ENCOUNTER — Encounter (HOSPITAL_BASED_OUTPATIENT_CLINIC_OR_DEPARTMENT_OTHER): Payer: Self-pay

## 2023-04-09 DIAGNOSIS — G4733 Obstructive sleep apnea (adult) (pediatric): Secondary | ICD-10-CM | POA: Diagnosis not present

## 2023-04-09 MED ORDER — SPIRONOLACTONE 25 MG PO TABS: 25.0000 mg | ORAL_TABLET | Freq: Every day | ORAL | 3 refills | Status: DC

## 2023-04-09 NOTE — Progress Notes (Signed)
Rx refill sent to preferred pharmacy. 

## 2023-04-16 ENCOUNTER — Other Ambulatory Visit: Payer: Self-pay | Admitting: Pharmacist

## 2023-04-16 NOTE — Progress Notes (Signed)
Pharmacy Medication Assistance Program Note    04/16/2023  Patient ID: Robin Odom, female   DOB: 17-Jun-1950, 72 y.o.   MRN: [id number]     04/16/2023  Outreach Medication One  Initial Outreach Date (Medication One) 04/16/2023  Manufacturer Medication One [employer] [facility] Rybelsus  Dose of Rybelsus 7 mh  Type of Assistance Manufacturer Assistance  Date Application Sent to Patient 04/16/2023  Application Items Requested Application  Date Application Sent to Prescriber 04/16/2023  Name of Prescriber Dorothyann Peng  Date Application Received From Patient 04/16/2023  Application Items Received From Patient Application  Date Application Received From Provider 04/16/2023  Date Application Submitted to Manufacturer 04/16/2023  Method Application Sent to Manufacturer Fax    Catie Eppie Gibson, PharmD, BCACP, CPP Clinical Pharmacist [facility] [phone]

## 2023-04-17 DIAGNOSIS — G4733 Obstructive sleep apnea (adult) (pediatric): Secondary | ICD-10-CM | POA: Diagnosis not present

## 2023-04-20 DIAGNOSIS — H52223 Regular astigmatism, bilateral: Secondary | ICD-10-CM | POA: Diagnosis not present

## 2023-05-01 ENCOUNTER — Other Ambulatory Visit: Payer: Self-pay | Admitting: Internal Medicine

## 2023-05-03 ENCOUNTER — Other Ambulatory Visit: Payer: Self-pay | Admitting: Internal Medicine

## 2023-05-03 ENCOUNTER — Ambulatory Visit: Payer: Medicare HMO | Admitting: Endocrinology

## 2023-05-03 ENCOUNTER — Encounter: Payer: Self-pay | Admitting: Endocrinology

## 2023-05-03 VITALS — BP 120/78 | HR 71 | Resp 20 | Ht 64.4 in | Wt 227.4 lb

## 2023-05-03 DIAGNOSIS — R7989 Other specified abnormal findings of blood chemistry: Secondary | ICD-10-CM

## 2023-05-03 NOTE — Progress Notes (Deleted)
Office Visit Note  Patient: Robin Odom             Date of Birth: Sep 01, 1950           MRN: [id number]             PCP: Dorothyann Peng, MD Referring: Dorothyann Peng, MD Visit Date: 05/17/2023 Occupation: @GUAROCC @  Subjective:    History of Present Illness: Robin Odom is a 73 y.o. female with history of gout, osteoarthritis, and DDD. Patient remains on  Allopurinol 300 mg 1 tablet by mouth daily.   Uric acid was 3.8 on 02/06/23.   CBC and CMP updated on 02/06/23.     Activities of Daily Living:  Patient reports morning stiffness for *** {minute/hour:19697}.   Patient {ACTIONS;DENIES/REPORTS:21021675::"Denies"} nocturnal pain.  Difficulty dressing/grooming: {ACTIONS;DENIES/REPORTS:21021675::"Denies"} Difficulty climbing stairs: {ACTIONS;DENIES/REPORTS:21021675::"Denies"} Difficulty getting out of chair: {ACTIONS;DENIES/REPORTS:21021675::"Denies"} Difficulty using hands for taps, buttons, cutlery, and/or writing: {ACTIONS;DENIES/REPORTS:21021675::"Denies"}  No Rheumatology ROS completed.   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 08/03/2022   History of wheezing 03/18/2020   Osteopenia of multiple sites 10/30/2019   Prediabetes 05/14/2018   Essential hypertension, benign 05/14/2018   Morbid obesity due to excess calories (HCC) 05/14/2018   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 tophus 07/20/2016   Primary osteoarthritis of both hands 07/20/2016   Primary  osteoarthritis of both knees 07/20/2016   Primary osteoarthritis of both feet 07/20/2016   DJD (degenerative joint disease), cervical 07/20/2016   Elevated CK 07/20/2016   History of humerus fracture 07/20/2016   History of hypertension 07/20/2016   History of diabetes mellitus 07/20/2016   History of hyperlipidemia 07/20/2016   Chronic rhinitis 10/20/2015   Anosmia 10/20/2015   Mild intermittent asthma 10/20/2015    Past Medical History:  Diagnosis Date   Arthritis    Diabetes mellitus without complication (HCC)    Gout    Hypertension    Partial retinal tear of both eyes without detachment 08/29/2017    Family History  Problem Relation Age of Onset   Heart disease Mother    Hypertension Mother    Diabetes Mother    Arthritis Father    Hypertension Father    Prostate cancer Father    Parkinson's disease Father    Heart disease Father    Sinusitis Sister    Allergic rhinitis Sister    Diabetes Sister    High Cholesterol Sister    Hypertension Sister    Breast cancer Cousin 81   Diabetes Brother    Hypertension Brother    High Cholesterol Brother    Hypertension Brother    Angioedema Neg Hx    Asthma Neg Hx    Eczema Neg Hx    Immunodeficiency Neg Hx    Urticaria Neg Hx    BRCA 1/2 Neg Hx    Past Surgical History:  Procedure Laterality Date   ABDOMINAL HYSTERECTOMY  1991   CARPAL TUNNEL RELEASE Right 2019   COLONOSCOPY  5 between 1994-2010   Left knee surgery  2006   ROTATOR CUFF REPAIR Left 2001   ROTATOR CUFF REPAIR Right 2003   SHOULDER SURGERY  2001/2003   TONSILECTOMY/ADENOIDECTOMY WITH MYRINGOTOMY  1971   Social History   Social History Narrative   Not on file   Immunization History  Administered Date(s) Administered   Influenza, High Dose Seasonal PF 02/05/2017, 02/12/2018, 12/16/2018, 01/13/2020, 01/18/2021   Influenza,inj,quad, With Preservative 02/13/2017, 02/01/2018   Influenza-Unspecified 02/03/2015, 01/18/2018, 01/29/2018, 12/16/2018,  01/18/2022, 01/02/2023   PFIZER(Purple Top)SARS-COV-2 Vaccination 01/01/2019, 01/23/2019, 08/19/2019, 08/13/2020   Pfizer Covid-19 Vaccine Bivalent Booster 27yrs & up 04/03/2021, 01/18/2022   Pfizer(Comirnaty)Fall Seasonal Vaccine 12 years and older 01/26/2023   Pneumococcal Conjugate-13 01/20/2014   Pneumococcal Polysaccharide-23 10/09/2016   Respiratory Syncytial Virus Vaccine,Recomb Aduvanted(Arexvy) 05/23/2022   Rsv, Bivalent, Protein Subunit Rsvpref,pf Verdis Frederickson) 05/23/2022   Tdap 12/13/2020   Unspecified SARS-COV-2 Vaccination 08/20/2019   Zoster Recombinant(Shingrix) 10/18/2019, 03/05/2020     Objective: Vital Signs: There were no vitals taken for this visit.   Physical Exam Vitals and nursing note reviewed.  Constitutional:      Appearance: She is well-developed.  HENT:     Head: Normocephalic and atraumatic.  Eyes:     Conjunctiva/sclera: Conjunctivae normal.  Cardiovascular:     Rate and Rhythm: Normal rate and regular rhythm.     Heart sounds: Normal heart sounds.  Pulmonary:     Effort: Pulmonary effort is normal.     Breath sounds: Normal breath sounds.  Abdominal:     General: Bowel sounds are normal.     Palpations: Abdomen is soft.  Musculoskeletal:     Cervical back: Normal range of motion.  Lymphadenopathy:     Cervical: No cervical adenopathy.  Skin:    General: Skin is warm and dry.     Capillary Refill: Capillary refill takes less than 2 seconds.  Neurological:     Mental Status: She is alert and oriented to person, place, and time.  Psychiatric:        Behavior: Behavior normal.      Musculoskeletal Exam: ***  CDAI Exam: CDAI Score: -- Patient Global: --; Provider Global: -- Swollen: --; Tender: -- Joint Exam 05/17/2023   No joint exam has been documented for this visit   There is currently no information documented on the homunculus. Go to the Rheumatology activity and complete the homunculus joint exam.  Investigation: No additional  findings.  Imaging: No results found.  Recent Labs: Lab Results  Component Value Date   WBC 8.1 02/06/2023   HGB 13.3 02/06/2023   PLT 303 02/06/2023   NA 141 02/06/2023   K 4.6 02/06/2023   CL 105 02/06/2023   CO2 30 02/06/2023   GLUCOSE 95 02/06/2023   BUN 23 02/06/2023   CREATININE 0.84 02/06/2023   BILITOT 0.6 02/06/2023   ALKPHOS 73 10/26/2022   AST 19 02/06/2023   ALT 14 02/06/2023   PROT 7.3 02/06/2023   ALBUMIN 4.2 10/26/2022   CALCIUM 10.1 02/06/2023   GFRAA 77 05/13/2020    Speciality Comments: No specialty comments available.  Procedures:  No procedures performed Allergies: Benadryl [diphenhydramine], Codeine, and Penicillins   Assessment / Plan:     Visit Diagnoses: 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 tophus  Medication monitoring encounter  Primary osteoarthritis of both hands  Primary osteoarthritis of both knees  Primary osteoarthritis of both feet  DDD (degenerative disc disease), cervical  Degeneration of intervertebral disc of lumbar region  without discogenic back pain or lower extremity pain  Osteopenia of multiple sites  History of hypertension  History of diabetes mellitus  History of hyperlipidemia  History of humerus fracture  Orders: No orders of the defined types were placed in this encounter.  No orders of the defined types were placed in this encounter.   Face-to-face time spent with patient was *** minutes. Greater than 50% of time was spent in counseling and coordination of care.  Follow-Up Instructions: No follow-ups on file.   Gearldine Bienenstock, PA-C  Note - This record has been created using Dragon software.  Chart creation errors have been sought, but may not always  have been located. Such creation errors do not reflect on  the standard of medical care.

## 2023-05-03 NOTE — Progress Notes (Addendum)
 Outpatient Endocrinology Note Sheri Prows, MD   Patient's Name: Robin Odom    DOB: 1950/06/01    MRN: [id number]  REASON OF VISIT: New consult for abnormal thyroid  function test, ? hyperthyroidism  REFERRING PROVIDER: Jarold Medici, MD  PCP: Jarold Medici, MD  HISTORY OF PRESENT ILLNESS:   LUNDON ROSIER is a 73 y.o. old female with past medical history as listed below is presented for a follow up abnormal thyroid  function test, ? Hyperthyroidism.  Pertinent Thyroid  History: Patient had laboratory test in June, 2024 with mildly elevated free T4 of 1.8 with upper normal 1.77, normal TSH and referred to endocrinology for further evaluation and management.  Patient does not recall having symptoms of palpitation, heat intolerance, change in bowel habit or increased sweating during that time.  She had complains of having gout flare however denies taking glucocorticoid for the treatment.  She has been taking multivitamin 1 tablet daily however denies taking additional biotin.  No illness, or upper respiratory tract symptoms during that time.  Patient had repeat thyroid  function test in October 2024 with normalization of free T4 of 1.66, normal TSH.  Normal thyroglobulin antibody.  No family history of thyroid  disorder.   Latest Reference Range & Units 10/26/22 15:30 02/20/23 12:04  TSH 0.450 - 4.500 uIU/mL 0.459 0.513  T4,Free(Direct) 0.82 - 1.77 ng/dL 8.19 (H) 8.33  Thyroglobulin Antibody 0.0 - 0.9 IU/mL  <1.0  (H): Data is abnormally high   She has not been on thyroid  medication.  Denies palpitation or heat intolerance.  Body weight has been relatively stable.  No change in bowel habit or diarrhea.  No cold intolerance or constipation.  She has prediabetes on Rybelsus  managed by primary care provider.  REVIEW OF SYSTEMS:  As per history of present illness.   PAST MEDICAL HISTORY: Past Medical History:  Diagnosis Date   Arthritis    Diabetes mellitus without complication (HCC)     Gout    Hypertension    Partial retinal tear of both eyes without detachment 08/29/2017    PAST SURGICAL HISTORY: Past Surgical History:  Procedure Laterality Date   ABDOMINAL HYSTERECTOMY  1991   CARPAL TUNNEL RELEASE Right 2019   COLONOSCOPY     5 between 1994-2010   Left knee surgery  2006   ROTATOR CUFF REPAIR Left 2001   ROTATOR CUFF REPAIR Right 2003   SHOULDER SURGERY  2001/2003   TONSILECTOMY/ADENOIDECTOMY WITH MYRINGOTOMY  1971    ALLERGIES: Allergies  Allergen Reactions   Benadryl [Diphenhydramine] Other (See Comments)    Temporary Amnesia    Codeine Other (See Comments)    Hallucinations   Penicillins Hives    FAMILY HISTORY:  Family History  Problem Relation Age of Onset   Heart disease Mother    Hypertension Mother    Diabetes Mother    Arthritis Father    Hypertension Father    Prostate cancer Father    Parkinson's disease Father    Heart disease Father    Sinusitis Sister    Allergic rhinitis Sister    Diabetes Sister    High Cholesterol Sister    Hypertension Sister    Breast cancer Cousin 29   Diabetes Brother    Hypertension Brother    High Cholesterol Brother    Hypertension Brother    Angioedema Neg Hx    Asthma Neg Hx    Eczema Neg Hx    Immunodeficiency Neg Hx    Urticaria Neg Hx  BRCA 1/2 Neg Hx     SOCIAL HISTORY: Social History   Socioeconomic History   Marital status: Single    Spouse name: Not on file   Number of children: Not on file   Years of education: Not on file   Highest education level: Doctorate  Occupational History   Occupation: retired  Tobacco Use   Smoking status: Never    Passive exposure: Never   Smokeless tobacco: Never  Vaping Use   Vaping status: Never Used  Substance and Sexual Activity   Alcohol use: Yes    Comment: rarely   Drug use: No   Sexual activity: Not Currently    Birth control/protection: None    Comment: Hysterectomy  Other Topics Concern   Not on file  Social History  Narrative   Not on file   Social Drivers of Health   Financial Resource Strain: Low Risk  (10/25/2022)   Overall Financial Resource Strain (CARDIA)    Difficulty of Paying Living Expenses: Not hard at all  Food Insecurity: No Food Insecurity (10/25/2022)   Hunger Vital Sign    Worried About Running Out of Food in the Last Year: Never true    Ran Out of Food in the Last Year: Never true  Transportation Needs: No Transportation Needs (10/25/2022)   PRAPARE - Administrator, Civil Service (Medical): No    Lack of Transportation (Non-Medical): No  Physical Activity: Sufficiently Active (10/25/2022)   Exercise Vital Sign    Days of Exercise per Week: 5 days    Minutes of Exercise per Session: 30 min  Stress: No Stress Concern Present (10/25/2022)   [employer] of Occupational Health - Occupational Stress Questionnaire    Feeling of Stress : Not at all  Social Connections: Moderately Integrated (10/25/2022)   Social Connection and Isolation Panel [NHANES]    Frequency of Communication with Friends and Family: More than three times a week    Frequency of Social Gatherings with Friends and Family: Twice a week    Attends Religious Services: More than 4 times per year    Active Member of [employer] or Organizations: Yes    Attends Engineer, Structural: More than 4 times per year    Marital Status: Never married    MEDICATIONS:  Current Outpatient Medications  Medication Sig Dispense Refill   allopurinol  (ZYLOPRIM ) 300 MG tablet TAKE 1 TABLET BY MOUTH EVERY DAY 90 tablet 0   amLODipine  (NORVASC ) 10 MG tablet TAKE 1 TABLET BY MOUTH EVERY DAY 90 tablet 1   Ascorbic Acid (VITAMIN C PO) Take 1,500 mg by mouth daily at 6 (six) AM.     atorvastatin  (LIPITOR) 20 MG tablet TAKE 1 TABLET BY MOUTH EVERY DAY 90 tablet 3   B Complex Vitamins (VITAMIN-B COMPLEX) TABS Take by mouth.     BENFOTIAMINE PO Take by mouth 2 (two) times daily.     Calcium  Carb-Cholecalciferol 500-10 MG-MCG  TABS Take 1 tablet by mouth daily.     carvedilol  (COREG ) 6.25 MG tablet TAKE 1 TABLET BY MOUTH TWICE A DAY 180 tablet 2   Cholecalciferol (VITAMIN D3) 125 MCG (5000 UT) CAPS Take by mouth.      colchicine  0.6 MG tablet Take 0.3 mg by mouth twice daily for 3 days for gout flare. Then take 0.3 mg by mouth daily as needed. 30 tablet 1   furosemide  (LASIX ) 20 MG tablet TAKE 1 TABLET BY MOUTH EVERY DAY 90 tablet 1   magnesium  oxide (MAG-OX) 400 MG tablet Take 400 mg by mouth daily.     olmesartan  (BENICAR ) 40 MG tablet TAKE 1 TABLET BY MOUTH EVERY DAY 90 tablet 2   Omega-3 Fatty Acids (OMEGA 3 PO) Take 2,500 mg by mouth daily. 2 tablets by mouth daily     potassium chloride  (MICRO-K ) 10 MEQ CR capsule TAKE 1 CAPSULE BY MOUTH EVERY DAY 90 capsule 1   Semaglutide  (RYBELSUS ) 7 MG TABS Take by mouth.     spironolactone  (ALDACTONE ) 25 MG tablet Take 1 tablet (25 mg total) by mouth daily. 90 tablet 3   triamcinolone  cream (KENALOG ) 0.1 % APPLY TO AFFECTED AREA TWICE DAILY AS NEEDED 45 g 0   TURMERIC CURCUMIN PO Take by mouth daily.     Cyanocobalamin  (B-12) 5000 MCG CAPS Take 1 capsule by mouth daily at 12 noon.     melatonin 3 MG TABS tablet Take 3 mg by mouth at bedtime.     No current facility-administered medications for this visit.    PHYSICAL EXAM: Vitals:   05/03/23 0829  BP: 120/78  Pulse: 71  Resp: 20  SpO2: 99%  Weight: 227 lb 6.4 oz (103.1 kg)  Height: 5' 4.4 (1.636 m)   Body mass index is 38.55 kg/m.  Wt Readings from Last 3 Encounters:  05/03/23 227 lb 6.4 oz (103.1 kg)  02/20/23 220 lb 9.6 oz (100.1 kg)  11/14/22 231 lb 12.8 oz (105.1 kg)    General: Well developed, well nourished female in no apparent distress.  HEENT: AT/[place], no external lesions. Hearing intact to the spoken word Eyes: EOMI. No stare, proptosis or lid lag. Conjunctiva clear and no icterus. No erythema or watering Neck: Trachea midline, neck supple without appreciable thyromegaly or lymphadenopathy and no  palpable thyroid  nodules Lungs: Clear to auscultation, no wheeze. Respirations not labored Heart: S1S2, Regular in rate and rhythm.  Abdomen: Soft, non tender, non distended, no striae Neurologic: Alert, oriented, normal speech, deep tendon biceps reflexes normal,  no gross focal neurological deficit Extremities: No pedal pitting edema, no tremors of outstretched hands Skin: Warm, color good.  Psychiatric: Does not appear depressed or anxious  PERTINENT HISTORIC LABORATORY AND IMAGING STUDIES:  All pertinent laboratory results were reviewed. Please see HPI also for further details.   TSH  Date Value Ref Range Status  02/20/2023 0.513 0.450 - 4.500 uIU/mL Final  10/26/2022 0.459 0.450 - 4.500 uIU/mL Final    Lab Results  Component Value Date   FREET4 1.66 02/20/2023   FREET4 1.80 (H) 10/26/2022   TSH 0.513 02/20/2023   TSH 0.459 10/26/2022    No results found for: THYROTRECAB  Lab Results  Component Value Date   TSH 0.513 02/20/2023   TSH 0.459 10/26/2022   FREET4 1.66 02/20/2023   FREET4 1.80 (H) 10/26/2022     No results found for: TSI   No components found for: TRAB    ASSESSMENT / PLAN  1. Abnormal thyroid  blood test    -In June 2024 she had mildly elevated free T4 with normal TSH however had normalization of free T4 and normal TSH in October 2024.  Patient denies hyperthyroid symptoms.  She is clinically euthyroid in the clinic today.  -Unclear reason for mildly elevated free T4 in June 2024 however it seems like transient elevation.  Plan: -I would like to recheck thyroid  function test today including free T3. -Provided thyroid  function test is normal, recommend to monitor thyroid  function test periodically, annual will be reasonable for few  years.  In that case she can follow-up with primary care provider and no routine endocrinology follow-up will be required.   Diagnoses and all orders for this visit:  Abnormal thyroid  blood test -     T4, free -      T3, free -     TSH   Normal thyroid  function test.  No routine endocrinology follow-up is required.  Continue to follow-up with primary care provider.   Latest Reference Range & Units 05/03/23 09:04  TSH 0.40 - 4.50 mIU/L 1.13  Triiodothyronine,Free,Serum 2.3 - 4.2 pg/mL 3.3  T4,Free(Direct) 0.8 - 1.8 ng/dL 1.3   DISPOSITION Follow up in clinic in PRN suggested.  All questions answered and patient verbalized understanding of the plan.  Tyshan Enderle, MD [facility] [address] [place], [address] [place], [place] [address] Phone # [phone]   At least part of this note was generated using voice recognition software. Inadvertent word errors may have occurred, which were not recognized during the proofreading process.

## 2023-05-04 LAB — T3, FREE: T3, Free: 3.3 pg/mL (ref 2.3–4.2)

## 2023-05-04 LAB — TSH: TSH: 1.13 m[IU]/L (ref 0.40–4.50)

## 2023-05-04 LAB — T4, FREE: Free T4: 1.3 ng/dL (ref 0.8–1.8)

## 2023-05-07 ENCOUNTER — Telehealth: Payer: Self-pay

## 2023-05-07 NOTE — Telephone Encounter (Signed)
 Patient given results  as directed by MD. No further questions at this time.

## 2023-05-07 NOTE — Telephone Encounter (Signed)
-----   Message from [place] Thapa sent at 05/04/2023  8:16 AM EST ----- Please notify patient of Normal thyroid function test.  No routine endocrinology follow-up is required.  Continue to follow-up with primary care provider.

## 2023-05-10 ENCOUNTER — Other Ambulatory Visit: Payer: Self-pay

## 2023-05-10 MED ORDER — RYBELSUS 7 MG PO TABS: ORAL_TABLET | ORAL | 2 refills | Status: DC

## 2023-05-11 ENCOUNTER — Telehealth: Payer: Self-pay

## 2023-05-11 NOTE — Telephone Encounter (Signed)
 PAP: Patient has been denied for pt assistance by [employer] due to patient above income threshold for program

## 2023-05-17 ENCOUNTER — Ambulatory Visit: Payer: Medicare HMO | Admitting: Physician Assistant

## 2023-05-17 DIAGNOSIS — M8589 Other specified disorders of bone density and structure, multiple sites: Secondary | ICD-10-CM

## 2023-05-17 DIAGNOSIS — M51369 Other intervertebral disc degeneration, lumbar region without mention of lumbar back pain or lower extremity pain: Secondary | ICD-10-CM

## 2023-05-17 DIAGNOSIS — M19041 Primary osteoarthritis, right hand: Secondary | ICD-10-CM

## 2023-05-17 DIAGNOSIS — Z8679 Personal history of other diseases of the circulatory system: Secondary | ICD-10-CM

## 2023-05-17 DIAGNOSIS — M1A09X0 Idiopathic chronic gout, multiple sites, without tophus (tophi): Secondary | ICD-10-CM

## 2023-05-17 DIAGNOSIS — M25562 Pain in left knee: Secondary | ICD-10-CM | POA: Diagnosis not present

## 2023-05-17 DIAGNOSIS — M19071 Primary osteoarthritis, right ankle and foot: Secondary | ICD-10-CM

## 2023-05-17 DIAGNOSIS — Z8639 Personal history of other endocrine, nutritional and metabolic disease: Secondary | ICD-10-CM

## 2023-05-17 DIAGNOSIS — M5030 Other cervical disc degeneration, unspecified cervical region: Secondary | ICD-10-CM

## 2023-05-17 DIAGNOSIS — M170 Bilateral primary osteoarthritis of knee: Secondary | ICD-10-CM

## 2023-05-17 DIAGNOSIS — Z8781 Personal history of (healed) traumatic fracture: Secondary | ICD-10-CM

## 2023-05-17 DIAGNOSIS — M25561 Pain in right knee: Secondary | ICD-10-CM | POA: Diagnosis not present

## 2023-05-17 DIAGNOSIS — Z5181 Encounter for therapeutic drug level monitoring: Secondary | ICD-10-CM

## 2023-05-18 DIAGNOSIS — G4733 Obstructive sleep apnea (adult) (pediatric): Secondary | ICD-10-CM | POA: Diagnosis not present

## 2023-05-22 ENCOUNTER — Other Ambulatory Visit: Payer: Self-pay | Admitting: Internal Medicine

## 2023-05-22 ENCOUNTER — Ambulatory Visit: Payer: Medicare HMO | Admitting: Endocrinology

## 2023-05-24 NOTE — Progress Notes (Signed)
 Office Visit Note  Patient: Robin Odom             Date of Birth: [birth year]           MRN: [id number]             PCP: Jarold Medici, MD Referring: Jarold Medici, MD Visit Date: 06/07/2023 Occupation: @GUAROCC @  Subjective:  Medication monitoring   History of Present Illness: Robin Odom is a 73 y.o. female with history of gout, osteoarthritis, and DDD.  Patient is currently taking allopurinol  300 mg daily.  She denies any signs or symptoms of a flare. She is having soreness and thickening of the right 3rd flexor tendon on the palmar aspect of her hand.  At times she has had locking consistent with a trigger finger.  She will be following up with her hand specialist next Thursday for further evaluation and treatment.  She denies any other joint pain or joint swelling at this time. She denies any new medical conditions.  Activities of Daily Living:  Patient reports morning stiffness for less than 5 minutes.   Patient Reports nocturnal pain.  Difficulty dressing/grooming: Denies Difficulty climbing stairs: Denies Difficulty getting out of chair: Denies Difficulty using hands for taps, buttons, cutlery, and/or writing: Reports  Review of Systems  Constitutional:  Negative for fatigue.  HENT:  Negative for mouth sores and mouth dryness.   Eyes:  Negative for dryness.  Respiratory:  Positive for shortness of breath.   Cardiovascular:  Negative for chest pain and palpitations.  Gastrointestinal:  Negative for blood in stool, constipation and diarrhea.  Endocrine: Negative for increased urination.  Genitourinary:  Negative for involuntary urination.  Musculoskeletal:  Positive for joint pain, joint pain, joint swelling and morning stiffness. Negative for gait problem, myalgias, muscle weakness, muscle tenderness and myalgias.  Skin:  Positive for rash. Negative for color change, hair loss and sensitivity to sunlight.  Allergic/Immunologic: Negative for susceptible to infections.   Neurological:  Negative for dizziness and headaches.  Hematological:  Negative for swollen glands.  Psychiatric/Behavioral:  Negative for depressed mood and sleep disturbance. The patient is not nervous/anxious.     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 08/03/2022   History of wheezing 03/18/2020   Osteopenia of multiple sites 10/30/2019   Prediabetes 05/14/2018   Essential hypertension, benign 05/14/2018   Morbid obesity due to excess calories (HCC) 05/14/2018   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 tophus 07/20/2016   Primary osteoarthritis of both hands 07/20/2016   Primary osteoarthritis of both knees 07/20/2016   Primary osteoarthritis of both feet 07/20/2016   DJD (degenerative joint disease), cervical 07/20/2016   Elevated CK 07/20/2016   History of humerus fracture 07/20/2016   History of hypertension 07/20/2016   History of diabetes mellitus 07/20/2016   History of hyperlipidemia 07/20/2016   Chronic rhinitis 10/20/2015   Anosmia 10/20/2015   Mild intermittent asthma 10/20/2015    Past Medical History:  Diagnosis Date  Arthritis    Diabetes mellitus without complication (HCC)    Gout    Hypertension    Partial retinal tear of both eyes without detachment 08/29/2017    Family History  Problem Relation Age of Onset   Heart disease Mother    Hypertension Mother    Diabetes Mother    Arthritis Father    Hypertension Father    Prostate cancer  Father    Parkinson's disease Father    Heart disease Father    Sinusitis Sister    Allergic rhinitis Sister    Diabetes Sister    High Cholesterol Sister    Hypertension Sister    Breast cancer Cousin 79   Diabetes Brother    Hypertension Brother    High Cholesterol Brother    Hypertension Brother    Angioedema Neg Hx    Asthma Neg Hx    Eczema Neg Hx    Immunodeficiency Neg Hx    Urticaria Neg Hx    BRCA 1/2 Neg Hx    Past Surgical History:  Procedure Laterality Date   ABDOMINAL HYSTERECTOMY  1991   CARPAL TUNNEL RELEASE Right 2019   COLONOSCOPY     5 between 1994-2010   Left knee surgery  2006   ROTATOR CUFF REPAIR Left 2001   ROTATOR CUFF REPAIR Right 2003   SHOULDER SURGERY  2001/2003   TONSILECTOMY/ADENOIDECTOMY WITH MYRINGOTOMY  1971   Social History   Social History Narrative   Not on file   Immunization History  Administered Date(s) Administered   Influenza, High Dose Seasonal PF 02/05/2017, 02/12/2018, 12/16/2018, 01/13/2020, 01/18/2021   Influenza,inj,quad, With Preservative 02/13/2017, 02/01/2018   Influenza-Unspecified 02/03/2015, 01/18/2018, 01/29/2018, 12/16/2018, 01/18/2022, 01/02/2023   PFIZER(Purple Top)SARS-COV-2 Vaccination 01/01/2019, 01/23/2019, 08/19/2019, 08/13/2020   Pfizer Covid-19 Vaccine Bivalent Booster 91yrs & up 04/03/2021, 01/18/2022   Pfizer(Comirnaty)Fall Seasonal Vaccine 12 years and older 01/26/2023   Pneumococcal Conjugate-13 01/20/2014   Pneumococcal Polysaccharide-23 10/09/2016   Respiratory Syncytial Virus Vaccine,Recomb Aduvanted(Arexvy) 05/23/2022   Rsv, Bivalent, Protein Subunit Rsvpref,pf [place]) 05/23/2022   Tdap 12/13/2020   Unspecified SARS-COV-2 Vaccination 08/20/2019   Zoster Recombinant(Shingrix) 10/18/2019, 03/05/2020     Objective: Vital Signs: BP 98/66 (BP Location: Left Arm, Patient Position: Sitting, Cuff Size: Large)   Pulse 70   Resp 14   Ht 5' 5.5 (1.664 m)   Wt 230 lb (104.3 kg)   BMI 37.69 kg/m     Physical Exam Vitals and nursing note reviewed.  Constitutional:      Appearance: She is well-developed.  HENT:     Head: Normocephalic and atraumatic.  Eyes:     Conjunctiva/sclera: Conjunctivae normal.  Cardiovascular:     Rate and Rhythm: Normal rate and regular rhythm.     Heart sounds: Normal heart sounds.  Pulmonary:     Effort: Pulmonary effort is normal.     Breath sounds: Normal breath sounds.  Abdominal:     General: Bowel sounds are normal.     Palpations: Abdomen is soft.  Musculoskeletal:     Cervical back: Normal range of motion.  Lymphadenopathy:     Cervical: No cervical adenopathy.  Skin:    General: Skin is warm and dry.     Capillary Refill: Capillary refill takes less than 2 seconds.  Neurological:     Mental Status: She is alert and oriented to person, place, and time.  Psychiatric:        Behavior: Behavior normal.      Musculoskeletal Exam: C-spine has  good range of motion.  Shoulder joints, elbow joints, wrist joints, MCPs, PIPs, DIPs have good range of motion with no synovitis.  CMC joint prominence noted bilaterally.  PIP and DIP thickening.  Some tenderness and thickening over the right third flexor tendon on the palmar aspect.  Hip joints have good range of motion.  Knee joints have good range of motion with no warmth or effusion.  Ankle joints have good range of motion with no tenderness or joint swelling.  CDAI Exam: CDAI Score: -- Patient Global: --; Provider Global: -- Swollen: --; Tender: -- Joint Exam 06/07/2023   No joint exam has been documented for this visit   There is currently no information documented on the homunculus. Go to the Rheumatology activity and complete the homunculus joint exam.  Investigation: No additional findings.  Imaging: No results found.  Recent Labs: Lab Results  Component Value Date   WBC 8.1 02/06/2023   HGB 13.3 02/06/2023   PLT 303 02/06/2023   NA 141 02/06/2023   K 4.6 02/06/2023   CL 105  02/06/2023   CO2 30 02/06/2023   GLUCOSE 95 02/06/2023   BUN 23 02/06/2023   CREATININE 0.84 02/06/2023   BILITOT 0.6 02/06/2023   ALKPHOS 73 10/26/2022   AST 19 02/06/2023   ALT 14 02/06/2023   PROT 7.3 02/06/2023   ALBUMIN 4.2 10/26/2022   CALCIUM  10.1 02/06/2023   GFRAA 77 05/13/2020    Speciality Comments: No specialty comments available.  Procedures:  No procedures performed Allergies: Benadryl [diphenhydramine], Codeine, and Penicillins   Assessment / Plan:     Visit Diagnoses: 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 tophus - She is not exhibiting any signs or symptoms of a gout flare currently.  She is clinically doing well taking allopurinol  300 mg daily.  She is tolerating allopurinol  without any side effects and has not had any recent gaps in therapy.  Patient has made necessary dietary changes which have been helpful.  Her uric acid was 3.8 on 02/06/2023.  Plan to update uric acid along with CBC and CMP today.  She will remain on allopurinol  as monotherapy.  She was advised to notify us  if she develops signs or symptoms of a gout flare.  She will follow-up in the office in 6 months or sooner if needed.  - Plan: Uric acid  Medication monitoring encounter - Uric acid was 3.8 on 02/06/23.  Orders for uric acid, CBC, and CMP updated today.  Plan: COMPLETE METABOLIC PANEL WITH GFR, CBC with Differential/Platelet, Uric acid  Primary osteoarthritis of both hands: CMC, PIP, DIP thickening consistent with osteoarthritis of both hands.  Some tenderness in thickening of the flexor tendon of her right third digit on the palmar aspect.  She is scheduled to follow-up with her hand specialist in 1 week for further evaluation and treatment.  Primary osteoarthritis of both knees: Good range of motion of both knee joints on examination today.  No warmth or effusion noted.  Primary osteoarthritis of both feet: Followed by podiatry.  Good range of motion of both ankle joints with no  tenderness or joint swelling at this time.  DDD (degenerative disc disease), cervical: C-spine has good range of motion with no discomfort at this time.  Degeneration of intervertebral disc of lumbar region without discogenic back pain or lower extremity pain: She is not experiencing any increased discomfort in her lower back currently.  No symptoms of radiculopathy.  Osteopenia of multiple sites - DEXA updated on 11/24/21:  Left femoral neck BMD 0.796 with T-score -1.1--ordered by Dr. Jarold.  Due to update DEXA in July 2025  Other medical conditions are listed as follows:  History of hypertension: Blood pressure was 98/66 today in the office.  History of diabetes mellitus  History of hyperlipidemia  History of humerus fracture  Orders: Orders Placed This Encounter  Procedures   COMPLETE METABOLIC PANEL WITH GFR   CBC with Differential/Platelet   Uric acid   No orders of the defined types were placed in this encounter.    Follow-Up Instructions: Return in about 6 months (around 12/05/2023) for Gout, Osteoarthritis, DDD.   Robin CHRISTELLA Craze, PA-C  Note - This record has been created using Dragon software.  Chart creation errors have been sought, but may not always  have been located. Such creation errors do not reflect on  the standard of medical care.

## 2023-06-07 ENCOUNTER — Ambulatory Visit: Payer: Medicare HMO | Attending: Physician Assistant | Admitting: Physician Assistant

## 2023-06-07 ENCOUNTER — Encounter: Payer: Self-pay | Admitting: Physician Assistant

## 2023-06-07 VITALS — BP 98/66 | HR 70 | Resp 14 | Ht 65.5 in | Wt 230.0 lb

## 2023-06-07 DIAGNOSIS — Z8639 Personal history of other endocrine, nutritional and metabolic disease: Secondary | ICD-10-CM

## 2023-06-07 DIAGNOSIS — Z8679 Personal history of other diseases of the circulatory system: Secondary | ICD-10-CM | POA: Diagnosis not present

## 2023-06-07 DIAGNOSIS — M19071 Primary osteoarthritis, right ankle and foot: Secondary | ICD-10-CM | POA: Diagnosis not present

## 2023-06-07 DIAGNOSIS — M170 Bilateral primary osteoarthritis of knee: Secondary | ICD-10-CM | POA: Diagnosis not present

## 2023-06-07 DIAGNOSIS — M19042 Primary osteoarthritis, left hand: Secondary | ICD-10-CM | POA: Diagnosis not present

## 2023-06-07 DIAGNOSIS — M8589 Other specified disorders of bone density and structure, multiple sites: Secondary | ICD-10-CM | POA: Diagnosis not present

## 2023-06-07 DIAGNOSIS — M51369 Other intervertebral disc degeneration, lumbar region without mention of lumbar back pain or lower extremity pain: Secondary | ICD-10-CM | POA: Diagnosis not present

## 2023-06-07 DIAGNOSIS — Z5181 Encounter for therapeutic drug level monitoring: Secondary | ICD-10-CM | POA: Diagnosis not present

## 2023-06-07 DIAGNOSIS — M5030 Other cervical disc degeneration, unspecified cervical region: Secondary | ICD-10-CM | POA: Diagnosis not present

## 2023-06-07 DIAGNOSIS — Z8781 Personal history of (healed) traumatic fracture: Secondary | ICD-10-CM

## 2023-06-07 DIAGNOSIS — M19072 Primary osteoarthritis, left ankle and foot: Secondary | ICD-10-CM

## 2023-06-07 DIAGNOSIS — M19041 Primary osteoarthritis, right hand: Secondary | ICD-10-CM | POA: Diagnosis not present

## 2023-06-07 DIAGNOSIS — M1A09X0 Idiopathic chronic gout, multiple sites, without tophus (tophi): Secondary | ICD-10-CM | POA: Diagnosis not present

## 2023-06-08 LAB — CBC WITH DIFFERENTIAL/PLATELET
Absolute Lymphocytes: 2485 {cells}/uL (ref 850–3900)
Absolute Monocytes: 738 {cells}/uL (ref 200–950)
Basophils Absolute: 41 {cells}/uL (ref 0–200)
Basophils Relative: 0.5 %
Eosinophils Absolute: 180 {cells}/uL (ref 15–500)
Eosinophils Relative: 2.2 %
HCT: 38.3 % (ref 35.0–45.0)
Hemoglobin: 12.4 g/dL (ref 11.7–15.5)
MCH: 27.3 pg (ref 27.0–33.0)
MCHC: 32.4 g/dL (ref 32.0–36.0)
MCV: 84.4 fL (ref 80.0–100.0)
MPV: 11 fL (ref 7.5–12.5)
Monocytes Relative: 9 %
Neutro Abs: 4756 {cells}/uL (ref 1500–7800)
Neutrophils Relative %: 58 %
Platelets: 314 10*3/uL (ref 140–400)
RBC: 4.54 10*6/uL (ref 3.80–5.10)
RDW: 13.8 % (ref 11.0–15.0)
Total Lymphocyte: 30.3 %
WBC: 8.2 10*3/uL (ref 3.8–10.8)

## 2023-06-08 LAB — COMPLETE METABOLIC PANEL WITH GFR
AG Ratio: 1.6 (calc) (ref 1.0–2.5)
ALT: 13 U/L (ref 6–29)
AST: 17 U/L (ref 10–35)
Albumin: 4.2 g/dL (ref 3.6–5.1)
Alkaline phosphatase (APISO): 71 U/L (ref 37–153)
BUN: 24 mg/dL (ref 7–25)
CO2: 31 mmol/L (ref 20–32)
Calcium: 10 mg/dL (ref 8.6–10.4)
Chloride: 104 mmol/L (ref 98–110)
Creat: 0.81 mg/dL (ref 0.60–1.00)
Globulin: 2.7 g/dL (ref 1.9–3.7)
Glucose, Bld: 96 mg/dL (ref 65–99)
Potassium: 4.6 mmol/L (ref 3.5–5.3)
Sodium: 141 mmol/L (ref 135–146)
Total Bilirubin: 0.4 mg/dL (ref 0.2–1.2)
Total Protein: 6.9 g/dL (ref 6.1–8.1)
eGFR: 77 mL/min/{1.73_m2} (ref >=60)

## 2023-06-08 LAB — URIC ACID: Uric Acid, Serum: 3.9 mg/dL (ref 2.5–7.0)

## 2023-06-08 NOTE — Progress Notes (Signed)
CBC and CMP WNL.  Uric acid WNL.

## 2023-06-11 DIAGNOSIS — H35033 Hypertensive retinopathy, bilateral: Secondary | ICD-10-CM | POA: Diagnosis not present

## 2023-06-11 DIAGNOSIS — H43813 Vitreous degeneration, bilateral: Secondary | ICD-10-CM | POA: Diagnosis not present

## 2023-06-11 DIAGNOSIS — H34832 Tributary (branch) retinal vein occlusion, left eye, with macular edema: Secondary | ICD-10-CM | POA: Diagnosis not present

## 2023-06-11 DIAGNOSIS — H2513 Age-related nuclear cataract, bilateral: Secondary | ICD-10-CM | POA: Diagnosis not present

## 2023-06-13 DIAGNOSIS — M21612 Bunion of left foot: Secondary | ICD-10-CM | POA: Diagnosis not present

## 2023-06-13 DIAGNOSIS — M2141 Flat foot [pes planus] (acquired), right foot: Secondary | ICD-10-CM | POA: Diagnosis not present

## 2023-06-13 DIAGNOSIS — M19072 Primary osteoarthritis, left ankle and foot: Secondary | ICD-10-CM | POA: Insufficient documentation

## 2023-06-13 DIAGNOSIS — M2142 Flat foot [pes planus] (acquired), left foot: Secondary | ICD-10-CM | POA: Diagnosis not present

## 2023-06-13 DIAGNOSIS — M66871 Spontaneous rupture of other tendons, right ankle and foot: Secondary | ICD-10-CM | POA: Diagnosis not present

## 2023-06-13 DIAGNOSIS — M19071 Primary osteoarthritis, right ankle and foot: Secondary | ICD-10-CM | POA: Diagnosis not present

## 2023-06-13 DIAGNOSIS — M21611 Bunion of right foot: Secondary | ICD-10-CM | POA: Diagnosis not present

## 2023-06-18 DIAGNOSIS — Z860101 Personal history of adenomatous and serrated colon polyps: Secondary | ICD-10-CM | POA: Diagnosis not present

## 2023-06-18 DIAGNOSIS — Z860100 Personal history of colon polyps, unspecified: Secondary | ICD-10-CM | POA: Diagnosis not present

## 2023-06-18 DIAGNOSIS — K5730 Diverticulosis of large intestine without perforation or abscess without bleeding: Secondary | ICD-10-CM | POA: Diagnosis not present

## 2023-06-18 DIAGNOSIS — Z1211 Encounter for screening for malignant neoplasm of colon: Secondary | ICD-10-CM | POA: Diagnosis not present

## 2023-06-18 LAB — HM COLONOSCOPY

## 2023-06-20 ENCOUNTER — Ambulatory Visit: Payer: Medicare HMO | Admitting: Internal Medicine

## 2023-06-20 ENCOUNTER — Encounter: Payer: Self-pay | Admitting: Internal Medicine

## 2023-06-21 ENCOUNTER — Ambulatory Visit (INDEPENDENT_AMBULATORY_CARE_PROVIDER_SITE_OTHER): Payer: Medicare HMO | Admitting: Nurse Practitioner

## 2023-06-21 ENCOUNTER — Encounter: Payer: Self-pay | Admitting: Internal Medicine

## 2023-06-21 ENCOUNTER — Ambulatory Visit: Payer: Medicare HMO

## 2023-06-21 VITALS — BP 110/70 | HR 66 | Temp 97.7°F | Ht 65.0 in | Wt 234.0 lb

## 2023-06-21 DIAGNOSIS — E7800 Pure hypercholesterolemia, unspecified: Secondary | ICD-10-CM

## 2023-06-21 DIAGNOSIS — R7303 Prediabetes: Secondary | ICD-10-CM

## 2023-06-21 DIAGNOSIS — Z6838 Body mass index (BMI) 38.0-38.9, adult: Secondary | ICD-10-CM

## 2023-06-21 DIAGNOSIS — Z79899 Other long term (current) drug therapy: Secondary | ICD-10-CM | POA: Diagnosis not present

## 2023-06-21 DIAGNOSIS — I10 Essential (primary) hypertension: Secondary | ICD-10-CM

## 2023-06-21 DIAGNOSIS — E66812 Obesity, class 2: Secondary | ICD-10-CM | POA: Diagnosis not present

## 2023-06-21 NOTE — Assessment & Plan Note (Signed)
Blood pressure is well controlled, has trace edema to bilateral lower extremities. Encouraged to wear her compression socks.

## 2023-06-21 NOTE — Progress Notes (Signed)
I,Robin Odom, CMA,acting as a Neurosurgeon for Robin Felts, FNP.,have documented all relevant documentation on the behalf of Robin Felts, FNP,as directed by  Robin Felts, FNP while in the presence of Robin Felts, FNP.  Subjective:  Patient ID: Robin Odom , female    DOB: [birth year] , 73 y.o.   MRN: [id number]  Chief Complaint  Patient presents with   Hypertension   Prediabetes   Hyperlipidemia    HPI  She is here today for a prediabetes/HTN check. Patient reports compliance with her medications. She denies headaches, chest pain and shortness of breath. She had her colonoscopy which was normal and does not need to go back for any further colonoscopies.   Hypertension This is a chronic problem. The current episode started yesterday. The problem has been gradually improving since onset. Pertinent negatives include no anxiety. Risk factors for coronary artery disease include obesity and post-menopausal state. Past treatments include direct vasodilators, angiotensin blockers and diuretics. There are no compliance problems.      Past Medical History:  Diagnosis Date   Arthritis    Diabetes mellitus without complication (HCC)    Gout    Hypertension    Partial retinal tear of both eyes without detachment 08/29/2017     Family History  Problem Relation Age of Onset   Heart disease Mother    Hypertension Mother    Diabetes Mother    Arthritis Father    Hypertension Father    Prostate cancer Father    Parkinson's disease Father    Heart disease Father    Sinusitis Sister    Allergic rhinitis Sister    Diabetes Sister    High Cholesterol Sister    Hypertension Sister    Breast cancer Cousin 20   Diabetes Brother    Hypertension Brother    High Cholesterol Brother    Hypertension Brother    Angioedema Neg Hx    Asthma Neg Hx    Eczema Neg Hx    Immunodeficiency Neg Hx    Urticaria Neg Hx    BRCA 1/2 Neg Hx      Current Outpatient Medications:    allopurinol  (ZYLOPRIM) 300 MG tablet, TAKE 1 TABLET BY MOUTH EVERY DAY, Disp: 90 tablet, Rfl: 0   amLODipine (NORVASC) 10 MG tablet, TAKE 1 TABLET BY MOUTH EVERY DAY, Disp: 90 tablet, Rfl: 1   Ascorbic Acid (VITAMIN C PO), Take 1,500 mg by mouth daily at 6 (six) AM., Disp: , Rfl:    atorvastatin (LIPITOR) 20 MG tablet, TAKE 1 TABLET BY MOUTH EVERY DAY, Disp: 90 tablet, Rfl: 3   B Complex Vitamins (VITAMIN-B COMPLEX) TABS, Take by mouth., Disp: , Rfl:    BENFOTIAMINE PO, Take by mouth 2 (two) times daily., Disp: , Rfl:    Calcium Carb-Cholecalciferol 500-10 MG-MCG TABS, Take 1 tablet by mouth daily., Disp: , Rfl:    carvedilol (COREG) 6.25 MG tablet, TAKE 1 TABLET BY MOUTH TWICE A DAY, Disp: 180 tablet, Rfl: 2   Cholecalciferol (VITAMIN D3) 125 MCG (5000 UT) CAPS, Take by mouth. , Disp: , Rfl:    colchicine 0.6 MG tablet, Take 0.3 mg by mouth twice daily for 3 days for gout flare. Then take 0.3 mg by mouth daily as needed., Disp: 30 tablet, Rfl: 1   furosemide (LASIX) 20 MG tablet, TAKE 1 TABLET BY MOUTH EVERY DAY, Disp: 90 tablet, Rfl: 1   magnesium oxide (MAG-OX) 400 MG tablet, Take 400 mg by mouth daily., Disp: ,  Rfl:    olmesartan (BENICAR) 40 MG tablet, TAKE 1 TABLET BY MOUTH EVERY DAY, Disp: 90 tablet, Rfl: 2   Omega-3 Fatty Acids (OMEGA 3 PO), Take 2,500 mg by mouth daily. 2 tablets by mouth daily, Disp: , Rfl:    potassium chloride (MICRO-K) 10 MEQ CR capsule, TAKE 1 CAPSULE BY MOUTH EVERY DAY, Disp: 90 capsule, Rfl: 1   RYBELSUS 3 MG TABS, Take 1 tablet by mouth every morning., Disp: , Rfl:    spironolactone (ALDACTONE) 25 MG tablet, Take 1 tablet (25 mg total) by mouth daily., Disp: 90 tablet, Rfl: 3   triamcinolone cream (KENALOG) 0.1 %, APPLY TO AFFECTED AREA TWICE DAILY AS NEEDED, Disp: 45 g, Rfl: 0   TURMERIC CURCUMIN PO, Take by mouth daily., Disp: , Rfl:    Semaglutide (RYBELSUS) 7 MG TABS, TAKE 1 TABLET BY MOUTH DAILY. (Patient not taking: Reported on 06/21/2023), Disp: 90 tablet, Rfl: 2    Allergies  Allergen Reactions   Benadryl [Diphenhydramine] Other (See Comments)    Temporary Amnesia    Codeine Other (See Comments)    Hallucinations   Penicillins Hives     Review of Systems  Constitutional: Negative.   Respiratory: Negative.    Cardiovascular: Negative.   Neurological: Negative.   Psychiatric/Behavioral: Negative.       Today's Vitals   06/21/23 1418  BP: 110/70  Pulse: 66  Temp: 97.7 F (36.5 C)  SpO2: 98%  Weight: 234 lb (106.1 kg)  Height: 5\' 5"  (1.651 m)   Body mass index is 38.94 kg/m.  Wt Readings from Last 3 Encounters:  06/21/23 234 lb (106.1 kg)  06/07/23 230 lb (104.3 kg)  05/03/23 227 lb 6.4 oz (103.1 kg)     Objective:  Physical Exam Vitals reviewed.  Constitutional:      General: She is not in acute distress.    Appearance: Normal appearance.  Cardiovascular:     Pulses: Normal pulses.     Heart sounds: Normal heart sounds. No murmur heard. Pulmonary:     Effort: Pulmonary effort is normal. No respiratory distress.     Breath sounds: Normal breath sounds. No wheezing.  Musculoskeletal:     Right lower leg: Edema (trace) present.     Left lower leg: Edema (trace) present.  Neurological:     General: No focal deficit present.     Mental Status: She is alert and oriented to person, place, and time.     Cranial Nerves: No cranial nerve deficit.     Motor: No weakness.  Psychiatric:        Mood and Affect: Mood normal.        Behavior: Behavior normal.        Thought Content: Thought content normal.        Judgment: Judgment normal.      Assessment And Plan:  Essential hypertension, benign Assessment & Plan: Blood pressure is well controlled, has trace edema to bilateral lower extremities. Encouraged to wear her compression socks.  Orders: -     CMP14+EGFR  Prediabetes Assessment & Plan: HgbA1c slightly increased at last visit, continue Rybelsus at current dose.   Orders: -     CMP14+EGFR -     Hemoglobin  A1c  Pure hypercholesterolemia Assessment & Plan: Cholesterol levels are normal. Continue current medications.   Orders: -     Lipid panel  Class 2 severe obesity due to excess calories with serious comorbidity and body mass index (BMI) of 38.0 to 38.9  in adult [facility]) Assessment & Plan: She is encouraged to strive for BMI less than 30 to decrease cardiac risk. Advised to aim for at least 150 minutes of exercise per week.   Other long term (current) drug therapy -     CBC  She is encouraged to strive for BMI less than 30 to decrease cardiac risk. Advised to aim for at least 150 minutes of exercise per week.    Return for 4 month bp & chol f/u. Marland Kitchen  Patient was given opportunity to ask questions. Patient verbalized understanding of the plan and was able to repeat key elements of the plan. All questions were answered to their satisfaction.  Robin Felts, FNP  I, Robin Felts, FNP, have reviewed all documentation for this visit. The documentation on 06/21/23 for the exam, diagnosis, procedures, and orders are all accurate and complete.   IF YOU HAVE BEEN REFERRED TO A SPECIALIST, IT MAY TAKE 1-2 WEEKS TO SCHEDULE/PROCESS THE REFERRAL. IF YOU HAVE NOT HEARD FROM US/SPECIALIST IN TWO WEEKS, PLEASE GIVE [place] A CALL AT [phone].   THE PATIENT IS ENCOURAGED TO PRACTICE SOCIAL DISTANCING DUE TO THE COVID-19 PANDEMIC.

## 2023-06-21 NOTE — Assessment & Plan Note (Signed)
HgbA1c slightly increased at last visit, continue Rybelsus at current dose.

## 2023-06-21 NOTE — Assessment & Plan Note (Signed)
 She is encouraged to strive for BMI less than 30 to decrease cardiac risk. Advised to aim for at least 150 minutes of exercise per week.

## 2023-06-21 NOTE — Patient Instructions (Signed)
 Hypertension, Adult Hypertension is another name for high blood pressure. High blood pressure forces your heart to work harder to pump blood. This can cause problems over time. There are two numbers in a blood pressure reading. There is a top number (systolic) over a bottom number (diastolic). It is best to have a blood pressure that is below 120/80. What are the causes? The cause of this condition is not known. Some other conditions can lead to high blood pressure. What increases the risk? Some lifestyle factors can make you more likely to develop high blood pressure: Smoking. Not getting enough exercise or physical activity. Being overweight. Having too much fat, sugar, calories, or salt (sodium) in your diet. Drinking too much alcohol. Other risk factors include: Having any of these conditions: Heart disease. Diabetes. High cholesterol. Kidney disease. Obstructive sleep apnea. Having a family history of high blood pressure and high cholesterol. Age. The risk increases with age. Stress. What are the signs or symptoms? High blood pressure may not cause symptoms. Very high blood pressure (hypertensive crisis) may cause: Headache. Fast or uneven heartbeats (palpitations). Shortness of breath. Nosebleed. Vomiting or feeling like you may vomit (nauseous). Changes in how you see. Very bad chest pain. Feeling dizzy. Seizures. How is this treated? This condition is treated by making healthy lifestyle changes, such as: Eating healthy foods. Exercising more. Drinking less alcohol. Your doctor may prescribe medicine if lifestyle changes do not help enough and if: Your top number is above 130. Your bottom number is above 80. Your personal target blood pressure may vary. Follow these instructions at home: Eating and drinking  If told, follow the DASH eating plan. To follow this plan: Fill one half of your plate at each meal with fruits and vegetables. Fill one fourth of your plate  at each meal with whole grains. Whole grains include whole-wheat pasta, brown rice, and whole-grain bread. Eat or drink low-fat dairy products, such as skim milk or low-fat yogurt. Fill one fourth of your plate at each meal with low-fat (lean) proteins. Low-fat proteins include fish, chicken without skin, eggs, beans, and tofu. Avoid fatty meat, cured and processed meat, or chicken with skin. Avoid pre-made or processed food. Limit the amount of salt in your diet to less than 1,500 mg each day. Do not drink alcohol if: Your doctor tells you not to drink. You are pregnant, may be pregnant, or are planning to become pregnant. If you drink alcohol: Limit how much you have to: 0-1 drink a day for women. 0-2 drinks a day for men. Know how much alcohol is in your drink. In the [place] one drink equals one 12 oz bottle of beer (355 mL), one 5 oz glass of wine (148 mL), or one 1 oz glass of hard liquor (44 mL). Lifestyle  Work with your doctor to stay at a healthy weight or to lose weight. Ask your doctor what the best weight is for you. Get at least 30 minutes of exercise that causes your heart to beat faster (aerobic exercise) most days of the week. This may include walking, swimming, or biking. Get at least 30 minutes of exercise that strengthens your muscles (resistance exercise) at least 3 days a week. This may include lifting weights or doing Pilates. Do not smoke or use any products that contain nicotine or tobacco. If you need help quitting, ask your doctor. Check your blood pressure at home as told by your doctor. Keep all follow-up visits. Medicines Take over-the-counter and prescription medicines  only as told by your doctor. Follow directions carefully. Do not skip doses of blood pressure medicine. The medicine does not work as well if you skip doses. Skipping doses also puts you at risk for problems. Ask your doctor about side effects or reactions to medicines that you should watch  for. Contact a doctor if: You think you are having a reaction to the medicine you are taking. You have headaches that keep coming back. You feel dizzy. You have swelling in your ankles. You have trouble with your vision. Get help right away if: You get a very bad headache. You start to feel mixed up (confused). You feel weak or numb. You feel faint. You have very bad pain in your: Chest. Belly (abdomen). You vomit more than once. You have trouble breathing. These symptoms may be an emergency. Get help right away. Call 911. Do not wait to see if the symptoms will go away. Do not drive yourself to the hospital. Summary Hypertension is another name for high blood pressure. High blood pressure forces your heart to work harder to pump blood. For most people, a normal blood pressure is less than 120/80. Making healthy choices can help lower blood pressure. If your blood pressure does not get lower with healthy choices, you may need to take medicine. This information is not intended to replace advice given to you by your health care provider. Make sure you discuss any questions you have with your health care provider. Document Revised: 02/03/2021 Document Reviewed: 02/03/2021 Elsevier Patient Education  2024 [employer].

## 2023-06-21 NOTE — Assessment & Plan Note (Signed)
Cholesterol levels are normal.  Continue current medications

## 2023-06-22 ENCOUNTER — Ambulatory Visit (INDEPENDENT_AMBULATORY_CARE_PROVIDER_SITE_OTHER): Payer: Medicare HMO

## 2023-06-22 DIAGNOSIS — Z0000 Encounter for general adult medical examination without abnormal findings: Secondary | ICD-10-CM | POA: Diagnosis not present

## 2023-06-22 LAB — CBC
Hematocrit: 37.5 % (ref 34.0–46.6)
Hemoglobin: 12.7 g/dL (ref 11.1–15.9)
MCH: 28.5 pg (ref 26.6–33.0)
MCHC: 33.9 g/dL (ref 31.5–35.7)
MCV: 84 fL (ref 79–97)
Platelets: 301 10*3/uL (ref 150–450)
RBC: 4.45 x10E6/uL (ref 3.77–5.28)
RDW: 14 % (ref 11.7–15.4)
WBC: 7 10*3/uL (ref 3.4–10.8)

## 2023-06-22 LAB — CMP14+EGFR
ALT: 13 [IU]/L (ref 0–32)
AST: 19 [IU]/L (ref 0–40)
Albumin: 4.1 g/dL (ref 3.8–4.8)
Alkaline Phosphatase: 87 [IU]/L (ref 44–121)
BUN/Creatinine Ratio: 16 (ref 12–28)
BUN: 16 mg/dL (ref 8–27)
Bilirubin Total: 0.4 mg/dL (ref 0.0–1.2)
CO2: 26 mmol/L (ref 20–29)
Calcium: 9.2 mg/dL (ref 8.7–10.3)
Chloride: 105 mmol/L (ref 96–106)
Creatinine, Ser: 0.99 mg/dL (ref 0.57–1.00)
Globulin, Total: 2.6 g/dL (ref 1.5–4.5)
Glucose: 123 mg/dL — ABNORMAL HIGH (ref 70–99)
Potassium: 4 mmol/L (ref 3.5–5.2)
Sodium: 143 mmol/L (ref 134–144)
Total Protein: 6.7 g/dL (ref 6.0–8.5)
eGFR: 60 mL/min/{1.73_m2} (ref >59)

## 2023-06-22 LAB — LIPID PANEL
Chol/HDL Ratio: 2.4 {ratio} (ref 0.0–4.4)
Cholesterol, Total: 128 mg/dL (ref 100–199)
HDL: 54 mg/dL (ref >39)
LDL Chol Calc (NIH): 62 mg/dL (ref 0–99)
Triglycerides: 53 mg/dL (ref 0–149)
VLDL Cholesterol Cal: 12 mg/dL (ref 5–40)

## 2023-06-22 LAB — HEMOGLOBIN A1C
Est. average glucose Bld gHb Est-mCnc: 126 mg/dL
Hgb A1c MFr Bld: 6 % — ABNORMAL HIGH (ref 4.8–5.6)

## 2023-06-22 NOTE — Progress Notes (Signed)
Subjective:   Robin Odom is a 73 y.o. female who presents for Medicare Annual (Subsequent) preventive examination.  Visit Complete: Virtual I connected with  Thereasa Odom on 06/22/23 by a audio enabled telemedicine application and verified that I am speaking with the correct person using two identifiers.  Patient Location: Home  Provider Location: Office/Clinic  I discussed the limitations of evaluation and management by telemedicine. The patient expressed understanding and agreed to proceed.  Vital Signs: Because this visit was a virtual/telehealth visit, some criteria may be missing or patient reported. Any vitals not documented were not able to be obtained and vitals that have been documented are patient reported.  Patient Medicare AWV questionnaire was completed by the patient on 06/22/2023; I have confirmed that all information answered by patient is correct and no changes since this date.        Objective:    There were no vitals filed for this visit. There is no height or weight on file to calculate BMI.     05/31/2022   11:08 AM 05/19/2021   11:07 AM 10/05/2020   10:01 AM 05/13/2020   10:28 AM 04/16/2019   10:15 AM 04/04/2018    3:04 PM  Advanced Directives  Does Patient Have a Medical Advance Directive? Yes Yes Yes Yes Yes Yes  Type of Estate agent of [place];Living will Healthcare Power of [place];Living will Healthcare Power of [place];Living will Healthcare Power of [place];Living will Healthcare Power of [place];Living will Healthcare Power of [employer];Living will  Does patient want to make changes to medical advance directive?   No - Patient declined   No - Patient declined  Copy of Healthcare Power of Attorney in Chart? No - copy requested No - copy requested No - copy requested No - copy requested No - copy requested No - copy requested    Current Medications (verified) Outpatient Encounter Medications as of 06/22/2023  Medication Sig    allopurinol (ZYLOPRIM) 300 MG tablet TAKE 1 TABLET BY MOUTH EVERY DAY   amLODipine (NORVASC) 10 MG tablet TAKE 1 TABLET BY MOUTH EVERY DAY   Ascorbic Acid (VITAMIN C PO) Take 1,500 mg by mouth daily at 6 (six) AM.   atorvastatin (LIPITOR) 20 MG tablet TAKE 1 TABLET BY MOUTH EVERY DAY   B Complex Vitamins (VITAMIN-B COMPLEX) TABS Take by mouth.   BENFOTIAMINE PO Take by mouth 2 (two) times daily.   Calcium Carb-Cholecalciferol 500-10 MG-MCG TABS Take 1 tablet by mouth daily.   carvedilol (COREG) 6.25 MG tablet TAKE 1 TABLET BY MOUTH TWICE A DAY   Cholecalciferol (VITAMIN D3) 125 MCG (5000 UT) CAPS Take by mouth.    colchicine 0.6 MG tablet Take 0.3 mg by mouth twice daily for 3 days for gout flare. Then take 0.3 mg by mouth daily as needed.   furosemide (LASIX) 20 MG tablet TAKE 1 TABLET BY MOUTH EVERY DAY   magnesium oxide (MAG-OX) 400 MG tablet Take 400 mg by mouth daily.   olmesartan (BENICAR) 40 MG tablet TAKE 1 TABLET BY MOUTH EVERY DAY   Omega-3 Fatty Acids (OMEGA 3 PO) Take 2,500 mg by mouth daily. 2 tablets by mouth daily   potassium chloride (MICRO-K) 10 MEQ CR capsule TAKE 1 CAPSULE BY MOUTH EVERY DAY   RYBELSUS 3 MG TABS Take 1 tablet by mouth every morning.   Semaglutide (RYBELSUS) 7 MG TABS TAKE 1 TABLET BY MOUTH DAILY. (Patient not taking: Reported on 06/21/2023)   spironolactone (ALDACTONE) 25 MG  tablet Take 1 tablet (25 mg total) by mouth daily.   triamcinolone cream (KENALOG) 0.1 % APPLY TO AFFECTED AREA TWICE DAILY AS NEEDED   TURMERIC CURCUMIN PO Take by mouth daily.   No facility-administered encounter medications on file as of 06/22/2023.    Allergies (verified) Benadryl [diphenhydramine], Codeine, and Penicillins   History: Past Medical History:  Diagnosis Date   Arthritis    Diabetes mellitus without complication (HCC)    Gout    Hypertension    Partial retinal tear of both eyes without detachment 08/29/2017   Past Surgical History:  Procedure Laterality Date    ABDOMINAL HYSTERECTOMY  1991   CARPAL TUNNEL RELEASE Right 2019   COLONOSCOPY     5 between 1994-2010   Left knee surgery  2006   ROTATOR CUFF REPAIR Left 2001   ROTATOR CUFF REPAIR Right 2003   SHOULDER SURGERY  2001/2003   TONSILECTOMY/ADENOIDECTOMY WITH MYRINGOTOMY  1971   Family History  Problem Relation Age of Onset   Heart disease Mother    Hypertension Mother    Diabetes Mother    Arthritis Father    Hypertension Father    Prostate cancer Father    Parkinson's disease Father    Heart disease Father    Sinusitis Sister    Allergic rhinitis Sister    Diabetes Sister    High Cholesterol Sister    Hypertension Sister    Breast cancer Cousin 54   Diabetes Brother    Hypertension Brother    High Cholesterol Brother    Hypertension Brother    Angioedema Neg Hx    Asthma Neg Hx    Eczema Neg Hx    Immunodeficiency Neg Hx    Urticaria Neg Hx    BRCA 1/2 Neg Hx    Social History   Socioeconomic History   Marital status: Single    Spouse name: Not on file   Number of children: Not on file   Years of education: Not on file   Highest education level: Doctorate  Occupational History   Occupation: retired  Tobacco Use   Smoking status: Never    Passive exposure: Never   Smokeless tobacco: Never  Vaping Use   Vaping status: Never Used  Substance and Sexual Activity   Alcohol use: Yes    Comment: rarely   Drug use: No   Sexual activity: Not Currently    Birth control/protection: None    Comment: Hysterectomy  Other Topics Concern   Not on file  Social History Narrative   Not on file   Social Drivers of Health   Financial Resource Strain: Low Risk  (06/20/2023)   Overall Financial Resource Strain (CARDIA)    Difficulty of Paying Living Expenses: Not hard at all  Food Insecurity: No Food Insecurity (06/20/2023)   Hunger Vital Sign    Worried About Running Out of Food in the Last Year: Never true    Ran Out of Food in the Last Year: Never true   Transportation Needs: No Transportation Needs (06/20/2023)   PRAPARE - Administrator, Civil Service (Medical): No    Lack of Transportation (Non-Medical): No  Physical Activity: Insufficiently Active (06/20/2023)   Exercise Vital Sign    Days of Exercise per Week: 3 days    Minutes of Exercise per Session: 30 min  Stress: No Stress Concern Present (06/20/2023)   [employer] of Occupational Health - Occupational Stress Questionnaire    Feeling of Stress : Not  at all  Social Connections: Moderately Integrated (06/20/2023)   Social Connection and Isolation Panel [NHANES]    Frequency of Communication with Friends and Family: More than three times a week    Frequency of Social Gatherings with Friends and Family: Three times a week    Attends Religious Services: More than 4 times per year    Active Member of Clubs or Organizations: Yes    Attends Banker Meetings: More than 4 times per year    Marital Status: Never married    Tobacco Counseling Counseling given: Not Answered   Clinical Intake:                        Activities of Daily Living     No data to display          Patient Care Team: Dorothyann Peng, MD as PCP - General (Internal Medicine) Jodelle Red, MD as PCP - Cardiology (Cardiology) Harlan Stains, [facility] (Inactive) (Pharmacist)  Indicate any recent Medical Services you may have received from other than [facility] providers in the past year (date may be approximate).     Assessment:   This is a routine wellness examination for Ast[place].  Hearing/Vision screen No results found.   Goals Addressed   None    Depression Screen    06/21/2023    2:22 PM 02/20/2023   11:05 AM 06/27/2022   10:21 AM 05/31/2022   11:09 AM 05/19/2021   11:08 AM 05/13/2020   10:29 AM 04/16/2019   10:15 AM  PHQ 2/9 Scores  PHQ - 2 Score 0 0 0 0 0 0 0  PHQ- 9 Score 0 1     3    Fall Risk    06/21/2023    2:22 PM 02/20/2023   11:05  AM 06/27/2022   10:21 AM 05/31/2022   11:09 AM 05/19/2021   11:08 AM  Fall Risk   Falls in the past year? 0 0 0 0 0  Number falls in past yr: 0 0 0 0   Injury with Fall? 0 0 0 0   Risk for fall due to : No Fall Risks No Fall Risks No Fall Risks Medication side effect Medication side effect  Follow up Falls evaluation completed Falls evaluation completed Falls evaluation completed Falls prevention discussed;Education provided;Falls evaluation completed Falls evaluation completed;Education provided;Falls prevention discussed    MEDICARE RISK AT HOME:    TIMED UP AND GO:  Was the test performed?  No    Cognitive Function:        05/31/2022   11:10 AM 05/19/2021   11:09 AM 05/13/2020   10:30 AM 04/16/2019   10:18 AM 04/04/2018    3:11 PM  6CIT Screen  What Year? 0 points 0 points 0 points 0 points 0 points  What month? 0 points 0 points 0 points 0 points 0 points  What time? 0 points 0 points 0 points 0 points 0 points  Count back from 20 0 points 0 points 0 points 0 points 0 points  Months in reverse 0 points 0 points 0 points 0 points 0 points  Repeat phrase 2 points 0 points 0 points 0 points 0 points  Total Score 2 points 0 points 0 points 0 points 0 points    Immunizations Immunization History  Administered Date(s) Administered   Influenza, High Dose Seasonal PF 02/05/2017, 02/12/2018, 12/16/2018, 01/13/2020, 01/18/2021   Influenza,inj,quad, With Preservative 02/13/2017, 02/01/2018   Influenza-Unspecified 02/03/2015,  01/18/2018, 01/29/2018, 12/16/2018, 01/18/2022, 01/02/2023   PFIZER(Purple Top)SARS-COV-2 Vaccination 01/01/2019, 01/23/2019, 08/19/2019, 08/13/2020   Pfizer Covid-19 Vaccine Bivalent Booster 46yrs & up 04/03/2021, 01/18/2022   Pfizer(Comirnaty)Fall Seasonal Vaccine 12 years and older 01/26/2023   Pneumococcal Conjugate-13 01/20/2014   Pneumococcal Polysaccharide-23 10/09/2016   Respiratory Syncytial Virus Vaccine,Recomb Aduvanted(Arexvy) 05/23/2022   Rsv,  Bivalent, Protein Subunit Rsvpref,pf Verdis Frederickson) 05/23/2022   Tdap 12/13/2020   Unspecified SARS-COV-2 Vaccination 08/20/2019   Zoster Recombinant(Shingrix) 10/18/2019, 03/05/2020    TDAP status: Up to date  Flu Vaccine status: Up to date  Pneumococcal vaccine status: Up to date  Covid-19 vaccine status: Information provided on how to obtain vaccines.   Qualifies for Shingles Vaccine? Yes   Zostavax completed Yes   Shingrix Completed?: Yes  Screening Tests Health Maintenance  Topic Date Due   COVID-19 Vaccine (9 - 2024-25 season) 03/23/2023   Medicare Annual Wellness (AWV)  06/01/2023   MAMMOGRAM  02/28/2025   Colonoscopy  06/17/2026   DTaP/Tdap/Td (2 - Td or Tdap) 12/14/2030   Pneumonia Vaccine 64+ Years old  Completed   INFLUENZA VACCINE  Completed   DEXA SCAN  Completed   Hepatitis C Screening  Completed   Zoster Vaccines- Shingrix  Completed   HPV VACCINES  Aged Out    Health Maintenance  Health Maintenance Due  Topic Date Due   COVID-19 Vaccine (9 - 2024-25 season) 03/23/2023   Medicare Annual Wellness (AWV)  06/01/2023    Colorectal cancer screening: Type of screening: Colonoscopy. Completed 06/18/2023. Repeat every 3 years  Mammogram status: Completed 03/01/2023. Repeat every year  Bone Density status: Completed July 27.2023. Results reflect: Bone density results: NORMAL. Repeat every n/a years.  Lung Cancer Screening: (Low Dose CT Chest recommended if Age 84-80 years, 20 pack-year currently smoking OR have quit w/in 15years.) does not qualify.   Lung Cancer Screening Referral: n/a  Additional Screening:  Hepatitis C Screening: does qualify; Completed June 12,2014  Vision Screening: Recommended annual ophthalmology exams for early detection of glaucoma and other disorders of the eye. Is the patient up to date with their annual eye exam?  Yes  Who is the provider or what is the name of the office in which the patient attends annual eye exams? [facility]. If pt is not established with a provider, would they like to be referred to a provider to establish care? No .   Dental Screening: Recommended annual dental exams for proper oral hygiene  Diabetic Foot Exam: Diabetic Foot Exam: Completed doesn't have DM  Community Resource Referral / Chronic Care Management: CRR required this visit?  No   CCM required this visit?  No     Plan:     I have personally reviewed and noted the following in the patient's chart:   Medical and social history Use of alcohol, tobacco or illicit drugs  Current medications and supplements including opioid prescriptions. Patient is not currently taking opioid prescriptions. Functional ability and status Nutritional status Physical activity Advanced directives List of other physicians Hospitalizations, surgeries, and ER visits in previous 12 months Vitals Screenings to include cognitive, depression, and falls Referrals and appointments  In addition, I have reviewed and discussed with patient certain preventive protocols, quality metrics, and best practice recommendations. A written personalized care plan for preventive services as well as general preventive health recommendations were provided to patient.     Marlyn Corporal, CMA   06/22/2023   After Visit Summary: (MyChart) Due to this being a telephonic visit, the  after visit summary with patients personalized plan was offered to patient via MyChart   Nurse Notes: Patient was pleasant.

## 2023-06-25 ENCOUNTER — Encounter: Payer: Self-pay | Admitting: Nurse Practitioner

## 2023-07-04 DIAGNOSIS — M65331 Trigger finger, right middle finger: Secondary | ICD-10-CM | POA: Diagnosis not present

## 2023-07-04 DIAGNOSIS — M1711 Unilateral primary osteoarthritis, right knee: Secondary | ICD-10-CM | POA: Diagnosis not present

## 2023-07-04 DIAGNOSIS — G5602 Carpal tunnel syndrome, left upper limb: Secondary | ICD-10-CM | POA: Diagnosis not present

## 2023-07-09 ENCOUNTER — Encounter: Payer: Self-pay | Admitting: Internal Medicine

## 2023-07-09 DIAGNOSIS — G4733 Obstructive sleep apnea (adult) (pediatric): Secondary | ICD-10-CM | POA: Diagnosis not present

## 2023-07-11 NOTE — Telephone Encounter (Signed)
 Called patient to see if she wanted to be seen virtually by [facility], patient stated she isnt that bad and she is actually feeling better today. Patient stated she didn't think she needed treatment.

## 2023-07-12 ENCOUNTER — Telehealth: Payer: Self-pay

## 2023-07-12 ENCOUNTER — Other Ambulatory Visit: Payer: Self-pay | Admitting: Physician Assistant

## 2023-07-12 DIAGNOSIS — M1712 Unilateral primary osteoarthritis, left knee: Secondary | ICD-10-CM | POA: Diagnosis not present

## 2023-07-12 NOTE — Telephone Encounter (Signed)
 Last Fill: 03/26/2023  Labs: 06/07/2023 CBC and CMP WNL Uric acid WNL  Next Visit: 12/04/2023  Last Visit: 06/07/2023  DX: 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 tophus   Current Dose per office note 06/07/2023: allopurinol 300 mg daily   Okay to refill Allopurinol?

## 2023-07-12 NOTE — Telephone Encounter (Signed)
 Called patient to see how she was feeling- patient stated she is feeling wonderful!

## 2023-07-26 DIAGNOSIS — G5602 Carpal tunnel syndrome, left upper limb: Secondary | ICD-10-CM | POA: Diagnosis not present

## 2023-07-26 DIAGNOSIS — M65331 Trigger finger, right middle finger: Secondary | ICD-10-CM | POA: Diagnosis not present

## 2023-07-31 ENCOUNTER — Telehealth: Payer: Self-pay | Admitting: Adult Health

## 2023-07-31 NOTE — Telephone Encounter (Signed)
 Patient reschedule appointment on 12/04/23 at 10:45 am

## 2023-07-31 NOTE — Telephone Encounter (Signed)
 LVM and sent mychart msg informing pt of need to reschedule 10/30/23 appt - NP out

## 2023-08-14 ENCOUNTER — Other Ambulatory Visit: Payer: Self-pay | Admitting: Internal Medicine

## 2023-08-17 ENCOUNTER — Encounter: Payer: Self-pay | Admitting: Internal Medicine

## 2023-08-22 ENCOUNTER — Telehealth: Payer: Self-pay | Admitting: Pharmacist

## 2023-08-22 ENCOUNTER — Other Ambulatory Visit: Payer: Self-pay

## 2023-08-22 DIAGNOSIS — R7303 Prediabetes: Secondary | ICD-10-CM

## 2023-08-22 MED ORDER — RYBELSUS 3 MG PO TABS: 1.0000 | ORAL_TABLET | Freq: Every morning | ORAL | 1 refills | Status: DC

## 2023-08-22 NOTE — Progress Notes (Signed)
 08/22/2023 Name: Robin Odom MRN: [id number] DOB: Oct 26, 1950  Chief Complaint  Patient presents with   Medication Management    Diabetes     Robin Odom is a 73 y.o. year old female who presented for a telephone visit.   They were referred to the pharmacist by a quality report for assistance in managing diabetes.    Subjective:  Care Team: Primary Care Provider: Cleave Curling, MD ; Next Scheduled Visit: 10/18/2023   Medication Access/Adherence  Current Pharmacy:  [id number] - [place], [place] - [address] [place] [address] Phone: [phone] Fax: [phone]   Patient reports affordability concerns with their medications: Yes -Over income for LIS and Patient Assistant Programs Patient reports access/transportation concerns to their pharmacy: No  Patient reports adherence concerns with their medications:  No         Objective:  Lab Results  Component Value Date   HGBA1C 6.0 (H) 06/21/2023    Lab Results  Component Value Date   CREATININE 0.99 06/21/2023   BUN 16 06/21/2023   NA 143 06/21/2023   K 4.0 06/21/2023   CL 105 06/21/2023   CO2 26 06/21/2023    Lab Results  Component Value Date   CHOL 128 06/21/2023   HDL 54 06/21/2023   LDLCALC 62 06/21/2023   TRIG 53 06/21/2023   CHOLHDL 2.4 06/21/2023    Medications Reviewed Today     Reviewed by Geronimo Krabbe, [facility] (Pharmacist) on 08/22/23 at 1207  Med List Status: <None>   Medication Order Taking? Sig Documenting Provider Last Dose Status Informant  allopurinol  (ZYLOPRIM ) 300 MG tablet [id number] Yes TAKE 1 TABLET BY MOUTH EVERY DAY Romayne Clubs, PA-C Taking Active   amLODipine  (NORVASC ) 10 MG tablet [id number] Yes TAKE 1 TABLET BY MOUTH EVERY DAY Cleave Curling, MD Taking Active   Ascorbic Acid (VITAMIN C PO) [id number] Yes Take 1,500 mg by mouth daily at 6 (six) AM. [provider] Taking Active   atorvastatin   (LIPITOR) 20 MG tablet [id number] Yes TAKE 1 TABLET BY MOUTH EVERY DAY Cleave Curling, MD Taking Active   B Complex Vitamins (VITAMIN-B COMPLEX) TABS [id number] Yes Take by mouth. [provider] Taking Active            Med Note Caretha Chapel, STEPHANIE A   Mon Aug 02, 2020  2:55 PM)    BENFOTIAMINE PO [id number] Yes Take by mouth 2 (two) times daily. [provider] Taking Active   Calcium  Carb-Cholecalciferol 500-10 MG-MCG TABS [id number] Yes Take 1 tablet by mouth daily. [provider] Taking Active Self  carvedilol  (COREG ) 6.25 MG tablet [id number] Yes TAKE 1 TABLET BY MOUTH TWICE A DAY Sheryle Donning, MD Taking Active   colchicine  0.6 MG tablet [id number] Yes Take 0.3 mg by mouth twice daily for 3 days for gout flare. Then take 0.3 mg by mouth daily as needed. Nicholas Bari, MD Taking Active   furosemide  (LASIX ) 20 MG tablet [id number] Yes TAKE 1 TABLET BY MOUTH EVERY DAY Cleave Curling, MD Taking Active   magnesium oxide (MAG-OX) 400 MG tablet [id number] Yes Take 400 mg by mouth daily. [provider] Taking Active   olmesartan  (BENICAR ) 40 MG tablet [id number] Yes TAKE 1 TABLET BY MOUTH EVERY DAY Cleave Curling, MD Taking Active   Omega-3 Fatty Acids (OMEGA 3 PO) [id number] Yes Take 2,500 mg by mouth daily. 2 tablets by mouth daily [provider] Taking Active  potassium chloride  (MICRO-K ) 10 MEQ CR capsule [id number] Yes TAKE 1 CAPSULE BY MOUTH EVERY DAY Cleave Curling, MD Taking Active   RYBELSUS  3 MG TABS [id number] Yes Take 1 tablet (3 mg total) by mouth every morning. Cleave Curling, MD Taking Active   spironolactone  (ALDACTONE ) 25 MG tablet [id number]  Take 1 tablet (25 mg total) by mouth daily. Sheryle Donning, MD  Expired 07/08/23 2359   triamcinolone  cream (KENALOG ) 0.1 % [id number] Yes APPLY TO AFFECTED AREA TWICE DAILY AS NEEDED Cleave Curling, MD Taking Active   TURMERIC CURCUMIN PO [id number] Yes Take by mouth daily. [provider] Taking Active               Assessment/Plan:   Diabetes: - Currently controlled -true diagnosis of "Prediabetes" A1c-6% -on statin therapy -Atorvastatin  --LDL 62 TG 53 -showed up on the adherence report for Rybelsus . -Called patient. She requested a Rybelsus  refill but it had not been sent in.   -Coordinated the refill.  Patient said it Rybelsus  was expensive as the reason she did not get a 90 day supply.   -[facility].  Patient does not have a deductible but is responsible for  25% of the cost of her medications when they are tier 3 .  (Rybelsus  is a tier 3 medication). -Representative could nto give me any more information because when I called them, the patient was not on the phone with me (she said she did not have time to call them today).   Plan: Call patient back tomorrow to discuss her insurance and see if she has time to call [facility] back.   Geronimo Krabbe, PharmD, BCACP Clinical Pharmacist [phone]

## 2023-08-23 ENCOUNTER — Telehealth: Payer: Self-pay | Admitting: Pharmacist

## 2023-08-23 DIAGNOSIS — Z8639 Personal history of other endocrine, nutritional and metabolic disease: Secondary | ICD-10-CM

## 2023-08-23 NOTE — Progress Notes (Signed)
   08/23/2023  Patient ID: Robin Odom, female   DOB: 10-17-1950, 73 y.o.   MRN: [id number]   Called [facility] payer with representative. Confirmed patient has $48 remaining until reaching catastrophic coverage. Due to high out-of-pocket cost, only 30 tablets of Rybelsus  will be dispensed at this time. Once the Patient reaches "catastrophic care...$2000), the copay should be $0.  Will follow up with Patient in 2 weeks.   Geronimo Krabbe, PharmD, BCACP Clinical Pharmacist [phone]

## 2023-08-31 ENCOUNTER — Telehealth: Payer: Self-pay | Admitting: Pharmacist

## 2023-08-31 DIAGNOSIS — R7303 Prediabetes: Secondary | ICD-10-CM

## 2023-08-31 NOTE — Progress Notes (Signed)
   08/31/2023  Patient ID: Robin Odom, female   DOB: [birth year], 73 y.o.   MRN: [id number]  Called patient to follow up on Rybelsus  billing. Unfortunately, she did not answer the phone. HIPAA compliant message was left on her voicemail.  [facility] to [facility] but had to leave a message on their voicemail.   Geronimo Krabbe, PharmD, BCACP Clinical Pharmacist [phone]

## 2023-09-03 ENCOUNTER — Telehealth: Payer: Self-pay | Admitting: Pharmacist

## 2023-09-03 DIAGNOSIS — R7303 Prediabetes: Secondary | ICD-10-CM

## 2023-09-03 NOTE — Progress Notes (Signed)
   09/03/2023  Patient ID: Robin Odom, female   DOB: March 26, 1951, 73 y.o.   MRN: [id number]  Patient called me back and let me know that she was able to get her Rybelsus  as we discussed.  According to her insurance, she has $48 remaining until reaching catastrophic coverage. Due to high out-of-pocket cost, only 30 tablets of Rybelsus  will be dispensed at this time. Once the Patient reaches "catastrophic care...$2000), the copay should be $0. Patient is enrolled in the payment plan for her medications and said she would inspect her bill and get back to me if something needed to be changed.  Patient was on the adherence report for Rybelsus - a 90 day supply was dispensed on 08/22/23.  A1c-6% On statin therapy-Atorvastatin  80 mg  Geronimo Krabbe, PharmD, [facility] Clinical Pharmacist [phone]

## 2023-09-07 ENCOUNTER — Ambulatory Visit (HOSPITAL_BASED_OUTPATIENT_CLINIC_OR_DEPARTMENT_OTHER): Admitting: Family

## 2023-09-17 DIAGNOSIS — M19072 Primary osteoarthritis, left ankle and foot: Secondary | ICD-10-CM | POA: Diagnosis not present

## 2023-09-18 DIAGNOSIS — M25562 Pain in left knee: Secondary | ICD-10-CM | POA: Diagnosis not present

## 2023-09-19 ENCOUNTER — Encounter (HOSPITAL_BASED_OUTPATIENT_CLINIC_OR_DEPARTMENT_OTHER): Payer: Self-pay

## 2023-09-19 MED ORDER — AMLODIPINE BESYLATE 10 MG PO TABS: ORAL_TABLET | ORAL | Status: DC

## 2023-09-21 ENCOUNTER — Other Ambulatory Visit (INDEPENDENT_AMBULATORY_CARE_PROVIDER_SITE_OTHER)

## 2023-09-21 ENCOUNTER — Ambulatory Visit (HOSPITAL_BASED_OUTPATIENT_CLINIC_OR_DEPARTMENT_OTHER): Admitting: Family

## 2023-09-21 VITALS — BP 120/68 | HR 80 | Ht 65.5 in | Wt 225.8 lb

## 2023-09-21 DIAGNOSIS — R002 Palpitations: Secondary | ICD-10-CM | POA: Diagnosis not present

## 2023-09-21 DIAGNOSIS — Z8639 Personal history of other endocrine, nutritional and metabolic disease: Secondary | ICD-10-CM

## 2023-09-21 DIAGNOSIS — I493 Ventricular premature depolarization: Secondary | ICD-10-CM

## 2023-09-21 DIAGNOSIS — E782 Mixed hyperlipidemia: Secondary | ICD-10-CM

## 2023-09-21 DIAGNOSIS — I10 Essential (primary) hypertension: Secondary | ICD-10-CM

## 2023-09-21 NOTE — Patient Instructions (Addendum)
 Medication Instructions:  Continue your current medications.   Lab Work: Your physician recommends that you return for lab work today: A1c, BMET, magnesium, TSH  Testing/Procedures: Your EKG today showed sinus rhythm with PVC's (premature ventricular contractions) which are early beats in the bottom chambers of the heart.  Your physician has recommended that you wear a Zio monitor.   This monitor is a medical device that records the heart's electrical activity. Doctors most often use these monitors to diagnose arrhythmias. Arrhythmias are problems with the speed or rhythm of the heartbeat. The monitor is a small device applied to your chest. You can wear one while you do your normal daily activities. While wearing this monitor if you have any symptoms to push the button and record what you felt. Once you have worn this monitor for the period of time provider prescribed (Usually 14 days), you will return the monitor device in the postage paid box. Once it is returned they will download the data collected and provide us  with a report which the provider will then review and we will call you with those results. Important tips:  Avoid showering during the first 24 hours of wearing the monitor. Avoid excessive sweating to help maximize wear time. Do not submerge the device, no hot tubs, and no swimming pools. Keep any lotions or oils away from the patch. After 24 hours you may shower with the patch on. Take brief showers with your back facing the shower head.  Do not remove patch once it has been placed because that will interrupt data and decrease adhesive wear time. Push the button when you have any symptoms and write down what you were feeling. Once you have completed wearing your monitor, remove and place into box which has postage paid and place in your outgoing mailbox.  If for some reason you have misplaced your box then call our office and we can provide another box and/or mail it off for  you.  Follow-Up: Your next appointment:   As scheduled with Dr. Veryl Gottron    Other Instructions To prevent palpitations: Make sure you are adequately hydrated.  Avoid and/or limit caffeine containing beverages like soda or tea. Exercise regularly.  Manage stress well. Some over the counter medications can cause palpitations such as Benadryl, AdvilPM, TylenolPM. Regular Advil or Tylenol do not cause palpitations.

## 2023-09-21 NOTE — Progress Notes (Unsigned)
  Cardiology Office Note:  .   Date:  09/21/2023  ID:  MADALAINE PORTIER, DOB 22-Nov-1950, MRN [id number] PCP: Cleave Curling, MD  [facility] Providers Cardiologist:  Sheryle Donning, MD { Click to update primary MD,subspecialty MD or APP then REFRESH:1}   History of Present Illness: .   Robin Odom is a 73 y.o. female ***  Tuesday started to feel poorly described "nervous energy"  Previously had lightheadedness only with position changes.   Dehydration always on my radar  No soda, no tea  Low appetite  223 lbs has been her more consistent weight.   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   ROS: Please see the history of present illness.    All other systems reviewed and are negative.   Studies Reviewed: .        *** Risk Assessment/Calculations:             Physical Exam:   VS:  BP 120/68 (BP Location: Left Arm, Patient Position: Sitting, Cuff Size: Large)   Pulse 80   Ht 5' 5.5" (1.664 m)   Wt 225 lb 12.8 oz (102.4 kg)   SpO2 93%   BMI 37.00 kg/m    Wt Readings from Last 3 Encounters:  09/21/23 225 lb 12.8 oz (102.4 kg)  06/21/23 234 lb (106.1 kg)  06/07/23 230 lb (104.3 kg)    GEN: Well nourished, well developed in no acute distress NECK: No JVD; No carotid bruits CARDIAC: ***RRR, no murmurs, rubs, gallops RESPIRATORY:  Clear to auscultation without rales, wheezing or rhonchi  ABDOMEN: Soft, non-tender, non-distended EXTREMITIES:  No edema; No deformity   ASSESSMENT AND PLAN: .   ***    {Are you ordering a CV Procedure (e.g. stress test, cath, DCCV, TEE, etc)?   Press F2        :[id number]}  Dispo: ***  Signed, Clearnce Curia, NP

## 2023-09-22 LAB — CBC
Hematocrit: 40 % (ref 34.0–46.6)
Hemoglobin: 12.7 g/dL (ref 11.1–15.9)
MCH: 27.4 pg (ref 26.6–33.0)
MCHC: 31.8 g/dL (ref 31.5–35.7)
MCV: 86 fL (ref 79–97)
Platelets: 290 10*3/uL (ref 150–450)
RBC: 4.63 x10E6/uL (ref 3.77–5.28)
RDW: 14.5 % (ref 11.7–15.4)
WBC: 9.4 10*3/uL (ref 3.4–10.8)

## 2023-09-22 LAB — BASIC METABOLIC PANEL WITH GFR
BUN/Creatinine Ratio: 19 (ref 12–28)
BUN: 18 mg/dL (ref 8–27)
CO2: 22 mmol/L (ref 20–29)
Calcium: 10 mg/dL (ref 8.7–10.3)
Chloride: 106 mmol/L (ref 96–106)
Creatinine, Ser: 0.97 mg/dL (ref 0.57–1.00)
Glucose: 88 mg/dL (ref 70–99)
Potassium: 4.3 mmol/L (ref 3.5–5.2)
Sodium: 142 mmol/L (ref 134–144)
eGFR: 62 mL/min/{1.73_m2} (ref >59)

## 2023-09-22 LAB — TSH: TSH: 0.582 u[IU]/mL (ref 0.450–4.500)

## 2023-09-22 LAB — MAGNESIUM: Magnesium: 2 mg/dL (ref 1.6–2.3)

## 2023-09-22 LAB — HEMOGLOBIN A1C
Est. average glucose Bld gHb Est-mCnc: 120 mg/dL
Hgb A1c MFr Bld: 5.8 % — ABNORMAL HIGH (ref 4.8–5.6)

## 2023-09-24 ENCOUNTER — Ambulatory Visit (HOSPITAL_BASED_OUTPATIENT_CLINIC_OR_DEPARTMENT_OTHER): Payer: Self-pay | Admitting: Family

## 2023-09-24 ENCOUNTER — Encounter (HOSPITAL_BASED_OUTPATIENT_CLINIC_OR_DEPARTMENT_OTHER): Payer: Self-pay | Admitting: Family

## 2023-09-27 ENCOUNTER — Other Ambulatory Visit: Payer: Self-pay | Admitting: Internal Medicine

## 2023-09-27 DIAGNOSIS — I10 Essential (primary) hypertension: Secondary | ICD-10-CM

## 2023-09-30 ENCOUNTER — Other Ambulatory Visit (HOSPITAL_BASED_OUTPATIENT_CLINIC_OR_DEPARTMENT_OTHER): Payer: Self-pay | Admitting: Family

## 2023-10-09 ENCOUNTER — Other Ambulatory Visit: Payer: Self-pay | Admitting: Physician Assistant

## 2023-10-09 NOTE — Telephone Encounter (Signed)
 Last Fill: 07/12/2023  Labs: 09/21/2023 WNL, 06/07/2023 Uric Acid 3.9  Next Visit: 12/04/2023  Last Visit: 06/07/2023  DX: 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 tophus   Current Dose per office note 06/07/2023: allopurinol  300 mg daily   Okay to refill Allopurinol ?

## 2023-10-11 DIAGNOSIS — G4733 Obstructive sleep apnea (adult) (pediatric): Secondary | ICD-10-CM | POA: Diagnosis not present

## 2023-10-15 NOTE — Telephone Encounter (Signed)
BP log for review.

## 2023-10-16 ENCOUNTER — Ambulatory Visit: Payer: Medicare HMO | Admitting: Dermatology

## 2023-10-16 ENCOUNTER — Encounter: Payer: Self-pay | Admitting: Dermatology

## 2023-10-16 ENCOUNTER — Encounter: Payer: Self-pay | Admitting: Internal Medicine

## 2023-10-16 VITALS — BP 104/76 | HR 72

## 2023-10-16 DIAGNOSIS — D2371 Other benign neoplasm of skin of right lower limb, including hip: Secondary | ICD-10-CM

## 2023-10-16 DIAGNOSIS — N951 Menopausal and female climacteric states: Secondary | ICD-10-CM | POA: Insufficient documentation

## 2023-10-16 DIAGNOSIS — L817 Pigmented purpuric dermatosis: Secondary | ICD-10-CM

## 2023-10-16 DIAGNOSIS — D259 Leiomyoma of uterus, unspecified: Secondary | ICD-10-CM | POA: Insufficient documentation

## 2023-10-16 DIAGNOSIS — D485 Neoplasm of uncertain behavior of skin: Secondary | ICD-10-CM

## 2023-10-16 DIAGNOSIS — D492 Neoplasm of unspecified behavior of bone, soft tissue, and skin: Secondary | ICD-10-CM

## 2023-10-16 DIAGNOSIS — L918 Other hypertrophic disorders of the skin: Secondary | ICD-10-CM | POA: Diagnosis not present

## 2023-10-16 DIAGNOSIS — L308 Other specified dermatitis: Secondary | ICD-10-CM

## 2023-10-16 NOTE — Telephone Encounter (Signed)
 Update

## 2023-10-16 NOTE — Progress Notes (Signed)
 New Patient Visit   Subjective  Robin Odom is a 73 y.o. female who presents for the following: Skin Lesion, Skin tag, Discoloration and itchy back  Patient states she has spot located at the Right lower leg that she would like to have examined. Patient reports the areas have been there for several  years. She reports the areas are not bothersome. Patient report her skin on her back primarily can be very itchy. She feels like it it nerve related. Patient rates irritation 8 out of 10. Patient reports she has not previously been treated for these areas. Patient denies Hx of bx. Patient denies family history of skin cancer(s).  The patient has spots, moles and lesions to be evaluated, some may be new or changing and the patient may have concern these could be cancer.  The following portions of the chart were reviewed this encounter and updated as appropriate: medications, allergies, medical history  Review of Systems:  No other skin or systemic complaints except as noted in HPI or Assessment and Plan.  Objective  Well appearing patient in no apparent distress; mood and affect are within normal limits.  A full examination was performed including scalp, head, eyes, ears, nose, lips, neck, chest, axillae, abdomen, back, buttocks, bilateral upper extremities, bilateral lower extremities, hands, feet, fingers, toes, fingernails, and toenails. All findings within normal limits unless otherwise noted below.   Relevant exam findings are noted in the Assessment and Plan.         Assessment & Plan   1. Suspicious pigmented lesion on leg - Assessment:  Patient reports a longstanding pigmented lesion on the leg, present since the 1980s and last evaluated by a dermatologist in 2008 or 2009. On visual examination, the lesion demonstrates a pigment network characteristic of a mole rather than a benign age spot. Given the concerning appearance and extended duration without recent evaluation, further  investigation is warranted to rule out malignancy.  - Plan:    Perform shave biopsy of the suspicious pigmented lesion     - Informed patient of procedure: cleaning with alcohol, local anesthesia with lidocaine, and removal using a special blade     - Discussed potential for scarring and white discoloration at the biopsy site    Send biopsy specimen for pathological examination    Provide post-procedure care instructions:     - Apply Aquaphor or Vaseline and cover with a Band-Aid     - Keep the area moist to promote healing and minimize scarring     - Rinse gently with water daily     - Elevate leg when resting to improve blood flow     - Monitor for signs of infection (spreading redness)    Review biopsy results within one week     - If benign, inform patient via patient portal message     - If abnormal, call patient to discuss and arrange follow-up with Dr. Pasi for potential excision  2. Shamberg's  - Assessment: Patient presents with visible veins and discoloration on the legs, likely related to reported history of leg swelling. The appearance is consistent with hemosiderin staining secondary to venous insufficiency. This condition results from iron deposition in the skin due to capillary damage from chronic edema.  - Plan:    Educate patient on the cause of skin discoloration (hemosiderin staining)    Advise that no topical treatments are effective for removing the staining    Encourage management of underlying venous insufficiency and edema  to prevent progression  3. Skin tag - Assessment:  Patient reports a new skin tag that appeared 2 weeks ago. On examination, the lesion is confirmed to be a benign skin tag.  - Plan:    Reassure patient about the benign nature of the skin tag    Offer cryotherapy treatment with liquid nitrogen if desired    Patient declines treatment at this time; agree to monitor     No follow-ups on file.  I, Jetta Ager, am acting as Neurosurgeon for  [employer], DO.  Documentation: I have reviewed the above documentation for accuracy and completeness, and I agree with the above.  Robin Roup, DO

## 2023-10-16 NOTE — Addendum Note (Signed)
 Addended by: Guss Legacy on: 10/16/2023 10:00 AM   Modules accepted: Orders

## 2023-10-16 NOTE — Patient Instructions (Addendum)

## 2023-10-16 NOTE — Telephone Encounter (Signed)
 Overall BP running low which can contribute to dizziness. Would recommend reduce Amlodipine  to half tablet (5mg ) daily.   Stirling Orton S Adrean Findlay, NP

## 2023-10-16 NOTE — Telephone Encounter (Signed)
 Go ahead and discontinue AMlodipine  and update med list please. TY!  Momo Braun S Lesette Frary, NP

## 2023-10-17 ENCOUNTER — Telehealth: Payer: Self-pay | Admitting: Pharmacist

## 2023-10-17 DIAGNOSIS — R7303 Prediabetes: Secondary | ICD-10-CM

## 2023-10-17 NOTE — Progress Notes (Signed)
   10/17/2023  Patient ID: Robin Odom, female   DOB: 12-21-1950, 73 y.o.   MRN: [id number]  Pharmacy Quality Measure Review  This patient is appearing on a report for being at risk of failing the adherence measure for diabetes medications this calendar year.   Medication: Rybelsus  3mg  Last fill date: 08/22/23 for 90 day supply  Reviewed chart and Dr. Anson Basta to be sure the 08/22/23 fill of Rybelsus  had not been reversed.  Patient has Prediabetes. HgA1c 5.8% Since she is on a medication prescribed for diabetes, she would fall into the SUPD measure. She is on Atorvastatin  20mg  last filled 07/27/23.  Olmesartan  40 mg last filled 10/01/23 for 90 day supply.   Geronimo Krabbe, PharmD, BCACP Clinical Pharmacist [phone]

## 2023-10-18 ENCOUNTER — Encounter: Payer: Self-pay | Admitting: Internal Medicine

## 2023-10-18 ENCOUNTER — Ambulatory Visit: Payer: Medicare HMO | Admitting: Internal Medicine

## 2023-10-18 VITALS — BP 120/78 | HR 70 | Temp 98.1°F | Ht 65.0 in | Wt 226.8 lb

## 2023-10-18 DIAGNOSIS — M25572 Pain in left ankle and joints of left foot: Secondary | ICD-10-CM

## 2023-10-18 DIAGNOSIS — E0590 Thyrotoxicosis, unspecified without thyrotoxic crisis or storm: Secondary | ICD-10-CM

## 2023-10-18 DIAGNOSIS — M25571 Pain in right ankle and joints of right foot: Secondary | ICD-10-CM | POA: Diagnosis not present

## 2023-10-18 DIAGNOSIS — I10 Essential (primary) hypertension: Secondary | ICD-10-CM

## 2023-10-18 DIAGNOSIS — Z6837 Body mass index (BMI) 37.0-37.9, adult: Secondary | ICD-10-CM

## 2023-10-18 DIAGNOSIS — M85852 Other specified disorders of bone density and structure, left thigh: Secondary | ICD-10-CM

## 2023-10-18 DIAGNOSIS — R7303 Prediabetes: Secondary | ICD-10-CM | POA: Diagnosis not present

## 2023-10-18 DIAGNOSIS — R600 Localized edema: Secondary | ICD-10-CM

## 2023-10-18 DIAGNOSIS — R7989 Other specified abnormal findings of blood chemistry: Secondary | ICD-10-CM

## 2023-10-18 DIAGNOSIS — E7800 Pure hypercholesterolemia, unspecified: Secondary | ICD-10-CM

## 2023-10-18 DIAGNOSIS — E66812 Obesity, class 2: Secondary | ICD-10-CM

## 2023-10-18 LAB — SURGICAL PATHOLOGY

## 2023-10-18 NOTE — Patient Instructions (Signed)
 Hypertension, Adult Hypertension is another name for high blood pressure. High blood pressure forces your heart to work harder to pump blood. This can cause problems over time. There are two numbers in a blood pressure reading. There is a top number (systolic) over a bottom number (diastolic). It is best to have a blood pressure that is below 120/80. What are the causes? The cause of this condition is not known. Some other conditions can lead to high blood pressure. What increases the risk? Some lifestyle factors can make you more likely to develop high blood pressure: Smoking. Not getting enough exercise or physical activity. Being overweight. Having too much fat, sugar, calories, or salt (sodium) in your diet. Drinking too much alcohol. Other risk factors include: Having any of these conditions: Heart disease. Diabetes. High cholesterol. Kidney disease. Obstructive sleep apnea. Having a family history of high blood pressure and high cholesterol. Age. The risk increases with age. Stress. What are the signs or symptoms? High blood pressure may not cause symptoms. Very high blood pressure (hypertensive crisis) may cause: Headache. Fast or uneven heartbeats (palpitations). Shortness of breath. Nosebleed. Vomiting or feeling like you may vomit (nauseous). Changes in how you see. Very bad chest pain. Feeling dizzy. Seizures. How is this treated? This condition is treated by making healthy lifestyle changes, such as: Eating healthy foods. Exercising more. Drinking less alcohol. Your doctor may prescribe medicine if lifestyle changes do not help enough and if: Your top number is above 130. Your bottom number is above 80. Your personal target blood pressure may vary. Follow these instructions at home: Eating and drinking  If told, follow the DASH eating plan. To follow this plan: Fill one half of your plate at each meal with fruits and vegetables. Fill one fourth of your plate  at each meal with whole grains. Whole grains include whole-wheat pasta, brown rice, and whole-grain bread. Eat or drink low-fat dairy products, such as skim milk or low-fat yogurt. Fill one fourth of your plate at each meal with low-fat (lean) proteins. Low-fat proteins include fish, chicken without skin, eggs, beans, and tofu. Avoid fatty meat, cured and processed meat, or chicken with skin. Avoid pre-made or processed food. Limit the amount of salt in your diet to less than 1,500 mg each day. Do not drink alcohol if: Your doctor tells you not to drink. You are pregnant, may be pregnant, or are planning to become pregnant. If you drink alcohol: Limit how much you have to: 0-1 drink a day for women. 0-2 drinks a day for men. Know how much alcohol is in your drink. In the [place] one drink equals one 12 oz bottle of beer (355 mL), one 5 oz glass of wine (148 mL), or one 1 oz glass of hard liquor (44 mL). Lifestyle  Work with your doctor to stay at a healthy weight or to lose weight. Ask your doctor what the best weight is for you. Get at least 30 minutes of exercise that causes your heart to beat faster (aerobic exercise) most days of the week. This may include walking, swimming, or biking. Get at least 30 minutes of exercise that strengthens your muscles (resistance exercise) at least 3 days a week. This may include lifting weights or doing Pilates. Do not smoke or use any products that contain nicotine or tobacco. If you need help quitting, ask your doctor. Check your blood pressure at home as told by your doctor. Keep all follow-up visits. Medicines Take over-the-counter and prescription medicines  only as told by your doctor. Follow directions carefully. Do not skip doses of blood pressure medicine. The medicine does not work as well if you skip doses. Skipping doses also puts you at risk for problems. Ask your doctor about side effects or reactions to medicines that you should watch  for. Contact a doctor if: You think you are having a reaction to the medicine you are taking. You have headaches that keep coming back. You feel dizzy. You have swelling in your ankles. You have trouble with your vision. Get help right away if: You get a very bad headache. You start to feel mixed up (confused). You feel weak or numb. You feel faint. You have very bad pain in your: Chest. Belly (abdomen). You vomit more than once. You have trouble breathing. These symptoms may be an emergency. Get help right away. Call 911. Do not wait to see if the symptoms will go away. Do not drive yourself to the hospital. Summary Hypertension is another name for high blood pressure. High blood pressure forces your heart to work harder to pump blood. For most people, a normal blood pressure is less than 120/80. Making healthy choices can help lower blood pressure. If your blood pressure does not get lower with healthy choices, you may need to take medicine. This information is not intended to replace advice given to you by your health care provider. Make sure you discuss any questions you have with your health care provider. Document Revised: 02/03/2021 Document Reviewed: 02/03/2021 Elsevier Patient Education  2024 [employer].

## 2023-10-18 NOTE — Progress Notes (Unsigned)
 I,Robin Odom, CMA,acting as a Neurosurgeon for Robin LOISE Slocumb, MD.,have documented all relevant documentation on the behalf of Robin LOISE Slocumb, MD,as directed by  Robin LOISE Slocumb, MD while in the presence of Robin LOISE Slocumb, MD.  Subjective:  Patient ID: Robin Odom , female    DOB: 1951-03-23 , 73 y.o.   MRN: [id number]  Chief Complaint  Patient presents with  . Hypertension    Patient presents today for bp, prediabetes & cholesterol follow up. She reports compliance with medications. Denies headache, chest pain & sob.  . Prediabetes    HPI Discussed the use of AI scribe software for clinical note transcription with the patient, who gave verbal consent to proceed.  History of Present Illness Robin Odom is a 73 year old female with hypertension who presents for a blood pressure check.  Since February, she has experienced episodes of hypotension with readings as low as 88/50 mmHg, accompanied by dizziness and lightheadedness, particularly upon standing or transitioning from a seated position. These symptoms have persisted despite medication adjustments. Her current medications include carvedilol , furosemide , olmesartan , spironolactone , and amlodipine , although the dose of amlodipine  was recently reduced and then discontinued. Her blood pressure has stabilized to 110-120/60-70 mmHg, but dizziness persists, especially when standing.  She denies having diabetes, with a recent A1c of 5.8%. She has a history of arthritis, particularly affecting her feet, causing significant pain and limiting mobility. She has received cortisone injections in the past, which provided temporary relief. She is considering alternative forms of exercise, such as swimming, due to the pain in her feet.  She consumes a significant amount of water, particularly in the latter half of the day, and experiences palpitations, although her pulse and blood pressure remain within normal ranges during these episodes. She also  experiences edema, which improves with leg elevation.  Her thyroid  function tests have shown fluctuations, with her TSH at the low end of normal. No tingling in her hands or feet, but she reports a burning, stabbing sensation from a bunion, leading to numbness in her big toe.   Hypertension This is a chronic problem. The current episode started yesterday. The problem has been gradually improving since onset. Risk factors for coronary artery disease include obesity and post-menopausal state. Past treatments include direct vasodilators, angiotensin blockers and diuretics.     Past Medical History:  Diagnosis Date  . Arthritis   . Diabetes mellitus without complication (HCC)   . Gout   . Hypertension   . Partial retinal tear of both eyes without detachment 08/29/2017     Family History  Problem Relation Age of Onset  . Heart disease Mother   . Hypertension Mother   . Diabetes Mother   . Arthritis Father   . Hypertension Father   . Prostate cancer Father   . Parkinson's disease Father   . Heart disease Father   . Sinusitis Sister   . Allergic rhinitis Sister   . Diabetes Sister   . High Cholesterol Sister   . Hypertension Sister   . Breast cancer Cousin 38  . Diabetes Brother   . Hypertension Brother   . High Cholesterol Brother   . Hypertension Brother   . Angioedema Neg Hx   . Asthma Neg Hx   . Eczema Neg Hx   . Immunodeficiency Neg Hx   . Urticaria Neg Hx   . BRCA 1/2 Neg Hx      Current Outpatient Medications:  .  allopurinol  (ZYLOPRIM ) 300 MG  tablet, TAKE 1 TABLET BY MOUTH EVERY DAY, Disp: 90 tablet, Rfl: 0 .  Ascorbic Acid (VITAMIN C PO), Take 1,500 mg by mouth daily at 6 (six) AM., Disp: , Rfl:  .  atorvastatin  (LIPITOR) 20 MG tablet, TAKE 1 TABLET BY MOUTH EVERY DAY, Disp: 90 tablet, Rfl: 3 .  B Complex Vitamins (VITAMIN-B COMPLEX) TABS, Take by mouth., Disp: , Rfl:  .  BENFOTIAMINE PO, Take by mouth 2 (two) times daily., Disp: , Rfl:  .  Calcium   Carb-Cholecalciferol 500-10 MG-MCG TABS, Take 1 tablet by mouth daily., Disp: , Rfl:  .  carvedilol  (COREG ) 6.25 MG tablet, TAKE 1 TABLET BY MOUTH TWICE A DAY, Disp: 180 tablet, Rfl: 2 .  Coenzyme Q10 (COQ10) 200 MG CAPS, Take 1 capsule by mouth daily at 6 (six) AM., Disp: , Rfl:  .  colchicine  0.6 MG tablet, Take 0.6 mg by mouth as needed (For gout flare up)., Disp: , Rfl:  .  furosemide  (LASIX ) 20 MG tablet, TAKE 1 TABLET BY MOUTH EVERY DAY, Disp: 90 tablet, Rfl: 1 .  magnesium oxide (MAG-OX) 400 MG tablet, Take 400 mg by mouth daily., Disp: , Rfl:  .  olmesartan  (BENICAR ) 40 MG tablet, TAKE 1 TABLET BY MOUTH EVERY DAY, Disp: 90 tablet, Rfl: 2 .  Omega-3 Fatty Acids (OMEGA 3 PO), Take 2,500 mg by mouth daily. 2 tablets by mouth daily, Disp: , Rfl:  .  RYBELSUS  3 MG TABS, Take 1 tablet (3 mg total) by mouth every morning., Disp: 90 tablet, Rfl: 1 .  spironolactone  (ALDACTONE ) 25 MG tablet, Take 1 tablet (25 mg total) by mouth daily., Disp: 90 tablet, Rfl: 3 .  triamcinolone  cream (KENALOG ) 0.1 %, APPLY TO AFFECTED AREA TWICE DAILY AS NEEDED, Disp: 45 g, Rfl: 0 .  TURMERIC CURCUMIN PO, Take by mouth daily., Disp: , Rfl:  .  potassium chloride  (MICRO-K ) 10 MEQ CR capsule, TAKE 1 CAPSULE BY MOUTH EVERY DAY, Disp: 90 capsule, Rfl: 1   Allergies  Allergen Reactions  . Benadryl [Diphenhydramine] Other (See Comments)    Temporary Amnesia   . Codeine Other (See Comments)    Hallucinations  . Penicillins Hives     Review of Systems  Constitutional: Negative.   Respiratory: Negative.    Cardiovascular: Negative.   Gastrointestinal: Negative.   Musculoskeletal:  Positive for arthralgias.  Neurological: Negative.   Psychiatric/Behavioral: Negative.       Today's Vitals   10/18/23 1149  BP: 120/78  Pulse: 70  Temp: 98.1 F (36.7 C)  SpO2: 98%  Weight: 226 lb 12.8 oz (102.9 kg)  Height: 5' 5 (1.651 m)   Body mass index is 37.74 kg/m.  Wt Readings from Last 3 Encounters:  10/18/23  226 lb 12.8 oz (102.9 kg)  09/21/23 225 lb 12.8 oz (102.4 kg)  06/21/23 234 lb (106.1 kg)     Objective:  Physical Exam Vitals and nursing note reviewed.  Constitutional:      Appearance: Normal appearance.  HENT:     Head: Normocephalic and atraumatic.   Eyes:     Extraocular Movements: Extraocular movements intact.    Cardiovascular:     Rate and Rhythm: Normal rate and regular rhythm.     Heart sounds: Normal heart sounds.  Pulmonary:     Effort: Pulmonary effort is normal.     Breath sounds: Normal breath sounds.   Musculoskeletal:     Cervical back: Normal range of motion.   Skin:    General: Skin is  warm.   Neurological:     General: No focal deficit present.     Mental Status: She is alert.   Psychiatric:        Mood and Affect: Mood normal.        Behavior: Behavior normal.        Assessment And Plan:  Essential hypertension, benign Assessment & Plan: Chronic, fair control. Goal BP<130/80.  She will continue with carvedilol  6.25mg  bid, olmesartan  40mg , and furosemide  20mg  daily. She is encouraged to follow a low sodium diet. She will f/u in four to six months for re-evaluation.  She has had episodes of hypotension with dizziness and palpitations, improved after amlodipine  discontinuation. Persistent minor dizziness. - Check blood pressure seated and standing. - Consider further medication adjustments if symptoms persist.   Prediabetes Assessment & Plan: Previous labs reviewed, her A1c has been elevated in the past. I will check an A1c today. Reminded to avoid refined sugars including sugary drinks/foods and processed meats including bacon, sausages and deli meats.  She is currently on Rybelsus  to address insulin  resistance.    Subclinical hyperthyroidism -     T4, free  Peripheral edema Assessment & Plan: Edema likely related to venous insufficiency, improved with leg elevation and amlodipine  discontinuation. - Monitor edema after discontinuation of  amlodipine . - Consider vein evaluation if edema persists.   Osteopenia of neck of left femur -     DG Bone Density; Future  Class 2 severe obesity due to excess calories with serious comorbidity and body mass index (BMI) of 37.0 to 37.9 in adult [facility]) Assessment & Plan: She is encouraged to strive for BMI less than 30 to decrease cardiac risk. Advised to aim for at least 150 minutes of exercise per week.     Return in 4 months (on 02/17/2024) for 4 month pre dm f/u.SABRA  Patient was given opportunity to ask questions. Patient verbalized understanding of the plan and was able to repeat key elements of the plan. All questions were answered to their satisfaction.    I, Robin LOISE Slocumb, MD, have reviewed all documentation for this visit. The documentation on 10/18/23 for the exam, diagnosis, procedures, and orders are all accurate and complete.   IF YOU HAVE BEEN REFERRED TO A SPECIALIST, IT MAY TAKE 1-2 WEEKS TO SCHEDULE/PROCESS THE REFERRAL. IF YOU HAVE NOT HEARD FROM US /SPECIALIST IN TWO WEEKS, PLEASE GIVE US  A CALL AT [phone].   THE PATIENT IS ENCOURAGED TO PRACTICE SOCIAL DISTANCING DUE TO THE COVID-19 PANDEMIC.

## 2023-10-19 LAB — T4, FREE: Free T4: 1.47 ng/dL (ref 0.82–1.77)

## 2023-10-23 ENCOUNTER — Encounter: Payer: Self-pay | Admitting: Dermatology

## 2023-10-24 ENCOUNTER — Other Ambulatory Visit: Payer: Self-pay | Admitting: Internal Medicine

## 2023-10-24 ENCOUNTER — Ambulatory Visit: Payer: Self-pay | Admitting: Dermatology

## 2023-10-24 NOTE — Telephone Encounter (Signed)
 Yes,  as long as keeps applying aquaphor or vaseline to keep the area protected.

## 2023-10-26 ENCOUNTER — Telehealth (HOSPITAL_BASED_OUTPATIENT_CLINIC_OR_DEPARTMENT_OTHER): Payer: Self-pay

## 2023-10-27 DIAGNOSIS — R600 Localized edema: Secondary | ICD-10-CM | POA: Insufficient documentation

## 2023-10-27 DIAGNOSIS — M2550 Pain in unspecified joint: Secondary | ICD-10-CM | POA: Insufficient documentation

## 2023-10-27 NOTE — Assessment & Plan Note (Signed)
 Chronic, fair control. Goal BP<130/80.  She will continue with carvedilol  6.25mg  bid, olmesartan  40mg , and furosemide  20mg  daily. She is encouraged to follow a low sodium diet. She will f/u in four to six months for re-evaluation.  She has had episodes of hypotension with dizziness and palpitations, improved after amlodipine  discontinuation. Persistent minor dizziness. - Check blood pressure seated and standing. - Consider further medication adjustments if symptoms persist.

## 2023-10-27 NOTE — Assessment & Plan Note (Signed)
 Previous labs reviewed, her A1c has been elevated in the past. I will check an A1c today. Reminded to avoid refined sugars including sugary drinks/foods and processed meats including bacon, sausages and deli meats.  She is currently on Rybelsus to address insulin resistance.

## 2023-10-27 NOTE — Assessment & Plan Note (Signed)
 Edema likely related to venous insufficiency, improved with leg elevation and amlodipine  discontinuation. - Monitor edema after discontinuation of amlodipine . - Consider vein evaluation if edema persists.

## 2023-10-27 NOTE — Assessment & Plan Note (Signed)
 She is encouraged to strive for BMI less than 30 to decrease cardiac risk. Advised to aim for at least 150 minutes of exercise per week.

## 2023-10-27 NOTE — Assessment & Plan Note (Signed)
 Severe arthritis in feet causing pain and mobility issues. She is followed by Podiatry.  Cortisone injections provided temporary relief. Exploring alternative treatments. - Consider using pain patches for arthritis pain. - Plan to discuss further treatment options with podiatrist next month. - Incorporate bone broth into diet for joint health.

## 2023-10-28 ENCOUNTER — Ambulatory Visit: Payer: Self-pay | Admitting: Internal Medicine

## 2023-10-29 DIAGNOSIS — M1712 Unilateral primary osteoarthritis, left knee: Secondary | ICD-10-CM | POA: Diagnosis not present

## 2023-10-30 ENCOUNTER — Ambulatory Visit: Payer: Medicare HMO | Admitting: Adult Health

## 2023-10-30 DIAGNOSIS — R002 Palpitations: Secondary | ICD-10-CM | POA: Diagnosis not present

## 2023-11-14 ENCOUNTER — Encounter (HOSPITAL_BASED_OUTPATIENT_CLINIC_OR_DEPARTMENT_OTHER): Payer: Self-pay | Admitting: Cardiology

## 2023-11-14 ENCOUNTER — Ambulatory Visit (HOSPITAL_BASED_OUTPATIENT_CLINIC_OR_DEPARTMENT_OTHER): Admitting: Cardiology

## 2023-11-14 VITALS — BP 110/70 | HR 66 | Ht 65.0 in | Wt 224.0 lb

## 2023-11-14 DIAGNOSIS — E782 Mixed hyperlipidemia: Secondary | ICD-10-CM

## 2023-11-14 DIAGNOSIS — Z6837 Body mass index (BMI) 37.0-37.9, adult: Secondary | ICD-10-CM | POA: Diagnosis not present

## 2023-11-14 DIAGNOSIS — R002 Palpitations: Secondary | ICD-10-CM | POA: Diagnosis not present

## 2023-11-14 DIAGNOSIS — Z9189 Other specified personal risk factors, not elsewhere classified: Secondary | ICD-10-CM

## 2023-11-14 DIAGNOSIS — I959 Hypotension, unspecified: Secondary | ICD-10-CM | POA: Diagnosis not present

## 2023-11-14 DIAGNOSIS — E66812 Obesity, class 2: Secondary | ICD-10-CM

## 2023-11-14 DIAGNOSIS — I10 Essential (primary) hypertension: Secondary | ICD-10-CM | POA: Diagnosis not present

## 2023-11-14 DIAGNOSIS — I493 Ventricular premature depolarization: Secondary | ICD-10-CM | POA: Diagnosis not present

## 2023-11-14 MED ORDER — OLMESARTAN MEDOXOMIL 40 MG PO TABS: 20.0000 mg | ORAL_TABLET | Freq: Every day | ORAL | Status: DC

## 2023-11-14 NOTE — Patient Instructions (Addendum)
 Starting cutting the olmesartan  in half (to 20 mg daily) and monitoring blood pressure. Let's see if we can keep the blood pressures higher, definitely more than 100 on the top and ideally more in the 110s. If your blood pressure is consistently less than 100 on this, stop olmesartan  completely. If blood pressure goes high, consistently more than 140 on the top, call and let us  know.  CALL THE OFFICE WHEN YOU NEED REFILLED   Follow up 2 to 3 months with Dr Lonni, Reche ORN NP, or Rosaline RAMAN NP

## 2023-11-14 NOTE — Progress Notes (Signed)
 Cardiology Office Note:  .    Date:  11/14/2023  ID:  JARROD BODKINS, DOB 04-23-1951, MRN [id number] PCP: Jarold Medici, MD  [facility] Providers Cardiologist:  Shelda Bruckner, MD     History of Present Illness: .    Robin Odom is a 73 y.o. female with a hx of hypertension, asthma, LE edema who is seen for follow up today. I initially met her 08/02/20 as a new consult at the request of Jarold Medici, MD for the evaluation and management of dyspnea on exertion.   CV risk: hypertension, hyperlipidemia, family history of heart disease. OSA on CPAP.  CV history: Treadmill stress with hypertensive response to exercise; echo also with diastolic dysfunction and mildly elevated filling pressure based on tissue doppler. LVEF normal, no significant valve disease.   Today: Has had more ankle swelling recently. Better with elevation, compression stockings. Planning to see a vein specialist to see if there is anything else she can do.   Brings her BP log today after stopping amlodipine . Her BP initially was 120s average systolic for about the first week after stopping amlodipine , but then starting drifting down again, was 90/56 yesterday. Feels lightheaded when her BP is low. Currently on spironolactone  25 mg daily, carvedilol  6.25 mg BID, furosemide  20 mg daily as needed, olmesartan  40 mg daily. Reviewed options today, see below.  ROS:  Denies chest pain, shortness of breath at rest. No PND, orthopnea, or unexpected weight gain. No syncope or palpitations. ROS otherwise negative except as noted.   Studies Reviewed: SABRA         Physical Exam:    VS:  BP 110/70 (BP Location: Left Arm, Patient Position: Sitting, Cuff Size: Normal)   Pulse 66   Ht 5' 5 (1.651 m)   Wt 224 lb (101.6 kg)   SpO2 97%   BMI 37.28 kg/m    Wt Readings from Last 3 Encounters:  11/14/23 224 lb (101.6 kg)  10/18/23 226 lb 12.8 oz (102.9 kg)  09/21/23 225 lb 12.8 oz (102.4 kg)    GEN: Well nourished, well  developed in no acute distress HEENT: Normal, moist mucous membranes NECK: No JVD CARDIAC: regular rhythm, normal S1 and S2, no rubs or gallops. No murmur. VASCULAR: Radial and DP pulses 2+ bilaterally. No carotid bruits RESPIRATORY:  Clear to auscultation without rales, wheezing or rhonchi  ABDOMEN: Soft, non-tender, non-distended MUSCULOSKELETAL:  Ambulates independently SKIN: Warm and dry, trace to 1+ RLE edema. Trivial LLE edema. Prominent bilateral ankle burse NEUROLOGIC:  Alert and oriented x 3. No focal neuro deficits noted. PSYCHIATRIC:  Normal affect   ASSESSMENT AND PLAN: .    Hypertension, with intermittent hypotension: goal <130/80 but systolic >100 -amlodipine  stopped due to hypotension -using furosemide  PRN daily -continue carvedilol  6.25 mg BID -currently on olmesartan  40 mg daily (in the morning). We will cut this to 20 mg and monitor BP -continue spironolactone  given diastolic dysfunction -discussed this is a balance between avoiding hypotension and managing the elevated blood pressures with exercise    Hyperlipidemia: Elevated ASCVD risk score -we discussed her elevated ASCVD risk and recommendations for statins. She declines.   Class 2 obesity Pre-diabetes -working on weight loss, BMI 37 today -on rybelsus   CV risk counseling and prevention -recommend heart healthy/Mediterranean diet, with whole grains, fruits, vegetable, fish, lean meats, nuts, and olive oil. Limit salt. -recommend moderate walking, 3-5 times/week for 30-50 minutes each session. Aim for at least 150 minutes.week. Goal should be pace of  3 miles/hours, or walking 1.5 miles in 30 minutes -recommend avoidance of tobacco products. Avoid excess alcohol.  Dispo: Follow-up in 2-3 months or sooner as needed  Signed, Shelda Bruckner, MD

## 2023-11-15 DIAGNOSIS — M1711 Unilateral primary osteoarthritis, right knee: Secondary | ICD-10-CM | POA: Diagnosis not present

## 2023-11-15 DIAGNOSIS — M170 Bilateral primary osteoarthritis of knee: Secondary | ICD-10-CM | POA: Diagnosis not present

## 2023-11-18 ENCOUNTER — Other Ambulatory Visit: Payer: Self-pay | Admitting: Internal Medicine

## 2023-11-19 ENCOUNTER — Other Ambulatory Visit: Payer: Self-pay | Admitting: Internal Medicine

## 2023-11-19 NOTE — Progress Notes (Signed)
 Office Visit Note  Patient: [place] Odom             Date of Birth: 1950-08-16           MRN: [id number]             PCP: Jarold Medici, MD Referring: Jarold Medici, MD Visit Date: 12/03/2023 Occupation: @GUAROCC @  Subjective:  Medication monitoring   History of Present Illness: AMREEN RACZKOWSKI is a 73 y.o. female with history of gout and osteoarthritis.  She is taking allopurinol  300 mg 1 tablet by mouth daily.  She continues to tolerate allopurinol  without any side effects.  Patient reports that she has not needed to take colchicine  since at least last fall 2024.  Patient denies any signs or symptoms of a gout flare.  She tries to avoid any dietary triggers.  Patient states that she had both knees injected 2 weeks apart in June 2025 which has provided significant relief.  Patient states that her joint stiffness and mobility have improved. Patient states that she has been experiencing increased edema in bilateral lower extremities.  Patient states that she has been evaluated by her PCP and cardiology.  She will be seeking referral to a vascular specialist as well. She denies any other new medical conditions.  Activities of Daily Living:  Patient reports morning stiffness for 0 minute.   Patient Denies nocturnal pain.  Difficulty dressing/grooming: Denies Difficulty climbing stairs: Denies Difficulty getting out of chair: Denies Difficulty using hands for taps, buttons, cutlery, and/or writing: Reports  Review of Systems  Constitutional:  Negative for fatigue.  HENT:  Negative for mouth sores and mouth dryness.   Eyes:  Negative for dryness.  Respiratory:  Negative for shortness of breath.   Cardiovascular:  Negative for chest pain and palpitations.  Gastrointestinal:  Negative for blood in stool, constipation and diarrhea.  Endocrine: Negative for increased urination.  Genitourinary:  Negative for involuntary urination.  Musculoskeletal:  Positive for joint pain, joint pain and  joint swelling. Negative for gait problem, myalgias, muscle weakness, morning stiffness, muscle tenderness and myalgias.  Skin:  Negative for color change, rash, hair loss and sensitivity to sunlight.  Allergic/Immunologic: Negative for susceptible to infections.  Neurological:  Negative for dizziness and headaches.  Hematological:  Negative for swollen glands.  Psychiatric/Behavioral:  Negative for depressed mood and sleep disturbance. The patient is not nervous/anxious.     PMFS History:  Patient Active Problem List   Diagnosis Date Noted   Peripheral edema 10/27/2023   Arthralgia 10/27/2023   Menopausal symptom 10/16/2023   Uterine leiomyoma 10/16/2023   Class 2 severe obesity due to excess calories with serious comorbidity and body mass index (BMI) of 38.0 to 38.9 in adult [facility]) 06/21/2023   Arthritis of ankle or foot, degenerative, left 06/13/2023   Bilateral chronic knee pain 02/28/2023   Encounter for general adult medical examination w/o abnormal findings 02/20/2023   Subclinical hyperthyroidism 02/20/2023   Tibialis posterior tendonitis, right 01/26/2023   Tibialis posterior tendon tear, nontraumatic, right 12/27/2022   Pure hypercholesterolemia 10/26/2022   Skin lesion of right leg 10/26/2022   OSA on CPAP 10/25/2022   Class 2 severe obesity due to excess calories with serious comorbidity and body mass index (BMI) of 37.0 to 37.9 in adult (HCC) 08/03/2022   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 08/03/2022   Ganglion cyst of both hands 03/13/2022   Toe injury, left, initial encounter 06/27/2021   Bilateral bunions 11/02/2020   History of wheezing 03/18/2020  Osteopenia of multiple sites 10/30/2019   Bunion, right foot 08/13/2018   Prediabetes 05/14/2018   Essential hypertension, benign 05/14/2018   Morbid obesity due to excess calories (HCC) 05/14/2018   Contracture of right Achilles tendon 02/08/2017   Plantar fasciitis of right foot 02/08/2017   Idiopathic chronic gout of  multiple sites without tophus 07/20/2016   Primary osteoarthritis of both hands 07/20/2016   Primary osteoarthritis of both knees 07/20/2016   Primary osteoarthritis of both feet 07/20/2016   DJD (degenerative joint disease), cervical 07/20/2016   Elevated CK 07/20/2016   History of humerus fracture 07/20/2016   History of hypertension 07/20/2016   History of diabetes mellitus 07/20/2016   History of hyperlipidemia 07/20/2016   Gastroesophageal reflux 12/08/2015   Chronic rhinitis 10/20/2015   Mild intermittent asthma 10/20/2015   Metatarsalgia of both feet 08/31/2015   Pes planus of both feet 08/31/2015   Abnormal glucose 10/05/2012   Arthropathy 10/05/2012   Gout 10/05/2012   Carpal tunnel syndrome 10/12/2009    Past Medical History:  Diagnosis Date   Arthritis    Diabetes mellitus without complication (HCC)    Gout    Hypertension    Partial retinal tear of both eyes without detachment 08/29/2017    Family History  Problem Relation Age of Onset   Heart disease Mother    Hypertension Mother    Diabetes Mother    Arthritis Father    Hypertension Father    Prostate cancer Father    Parkinson's disease Father    Heart disease Father    Sinusitis Sister    Allergic rhinitis Sister    Diabetes Sister    High Cholesterol Sister    Hypertension Sister    Breast cancer Cousin 22   Diabetes Brother    Hypertension Brother    High Cholesterol Brother    Hypertension Brother    Angioedema Neg Hx    Asthma Neg Hx    Eczema Neg Hx    Immunodeficiency Neg Hx    Urticaria Neg Hx    BRCA 1/2 Neg Hx    Past Surgical History:  Procedure Laterality Date   ABDOMINAL HYSTERECTOMY  1991   CARPAL TUNNEL RELEASE Right 2019   COLONOSCOPY     5 between 1994-2010   Left knee surgery  2006   ROTATOR CUFF REPAIR Left 2001   ROTATOR CUFF REPAIR Right 2003   SHOULDER SURGERY  2001/2003   TONSILECTOMY/ADENOIDECTOMY WITH MYRINGOTOMY  1971   Social History   Social History  Narrative   Not on file   Immunization History  Administered Date(s) Administered    sv, Bivalent, Protein Subunit Rsvpref,pf (Abrysvo) 05/23/2022   Influenza, High Dose Seasonal PF 02/05/2017, 02/12/2018, 12/16/2018, 01/13/2020, 01/18/2021   Influenza,inj,quad, With Preservative 02/13/2017, 02/01/2018   Influenza-Unspecified 02/03/2015, 01/18/2018, 01/29/2018, 12/16/2018, 01/18/2022, 01/02/2023   PFIZER(Purple Top)SARS-COV-2 Vaccination 01/01/2019, 01/23/2019, 08/19/2019, 08/13/2020   Pfizer Covid-19 Vaccine Bivalent Booster 66yrs & up 04/03/2021, 01/18/2022   Pfizer(Comirnaty)Fall Seasonal Vaccine 12 years and older 01/26/2023   Pneumococcal Conjugate-13 01/20/2014   Pneumococcal Polysaccharide-23 10/09/2016   Respiratory Syncytial Virus Vaccine,Recomb Aduvanted(Arexvy) 05/23/2022   Tdap 12/13/2020   Unspecified SARS-COV-2 Vaccination 08/20/2019   Zoster Recombinant(Shingrix) 10/18/2019, 03/05/2020     Objective: Vital Signs: BP 114/74 (BP Location: Left Arm, Patient Position: Sitting, Cuff Size: Large)   Pulse 69   Resp 14   Ht 5' 5.5 (1.664 m)   Wt 223 lb (101.2 kg)   BMI 36.54 kg/m    Physical  Exam Vitals and nursing note reviewed.  Constitutional:      Appearance: She is well-developed.  HENT:     Head: Normocephalic and atraumatic.  Eyes:     Conjunctiva/sclera: Conjunctivae normal.  Cardiovascular:     Rate and Rhythm: Normal rate and regular rhythm.     Heart sounds: Normal heart sounds.  Pulmonary:     Effort: Pulmonary effort is normal.     Breath sounds: Normal breath sounds.  Abdominal:     General: Bowel sounds are normal.     Palpations: Abdomen is soft.  Musculoskeletal:     Cervical back: Normal range of motion.  Lymphadenopathy:     Cervical: No cervical adenopathy.  Skin:    General: Skin is warm and dry.     Capillary Refill: Capillary refill takes less than 2 seconds.  Neurological:     Mental Status: She is alert and oriented to person,  place, and time.  Psychiatric:        Behavior: Behavior normal.      Musculoskeletal Exam: C-spine has slightly limited range of motion without rotation.  Good flexion and extension of the cervical spine.  Shoulder joints, elbow joints, wrist joints, MCPs, PIPs, DIPs have good range of motion with no synovitis.  PIP and DIP thickening consistent with osteoarthritis of both hands.  Knee joints have good ROM with no warmth or effusion.  Ankle joints have good ROM with no tenderness or joint swelling. Pedal edema noted in bilateral LE.   CDAI Exam: CDAI Score: -- Patient Global: --; Provider Global: -- Swollen: --; Tender: -- Joint Exam 12/03/2023   No joint exam has been documented for this visit   There is currently no information documented on the homunculus. Go to the Rheumatology activity and complete the homunculus joint exam.  Investigation: No additional findings.  Imaging: LONG TERM MONITOR (3-14 DAYS) Result Date: 11/14/2023 14 Day Zio Monitor Quality: Fair.  Baseline artifact. Predominant rhythm: sinus rhythm Average heart rate: 77 bmp Max heart rate: 103 bpm Min heart rate: 63 bpm Pauses >2.5 seconds: none Rare (<1%) PACs Frequent (14.1%) PVCs Ventricular bigeminy and trigeminy Tiffany C. Raford, MD, [facility] 11/14/2023 2:33 PM   Recent Labs: Lab Results  Component Value Date   WBC 9.4 09/21/2023   HGB 12.7 09/21/2023   PLT 290 09/21/2023   NA 142 09/21/2023   K 4.3 09/21/2023   CL 106 09/21/2023   CO2 22 09/21/2023   GLUCOSE 88 09/21/2023   BUN 18 09/21/2023   CREATININE 0.97 09/21/2023   BILITOT 0.4 06/21/2023   ALKPHOS 87 06/21/2023   AST 19 06/21/2023   ALT 13 06/21/2023   PROT 6.7 06/21/2023   ALBUMIN 4.1 06/21/2023   CALCIUM  10.0 09/21/2023   GFRAA 77 05/13/2020    Speciality Comments: No specialty comments available.  Procedures:  No procedures performed Allergies: Diphenhydramine, Codeine, and Penicillins    Assessment / Plan:     Visit  Diagnoses: 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 tophus - She has not had any signs or symptoms of a gout flare.  She has clinically been doing well taking allopurinol  300 mg 1 tablet by mouth daily.  She is tolerating allopurinol  without any side effects and has not had any recent gaps in therapy.  She has not needed to take colchicine  since at least fall 2024.  Her uric acid level was within the desirable range: 3.9 on 06/07/2023.  She has been avoiding dietary triggers.  Plan to  update CBC, CMP, and uric acid level today.  She was advised to notify us  if she develops any signs or symptoms of a gout flare.  No medication changes will be made at this time.  She will follow-up in the office in 6 months or sooner if needed.  . - Plan: CBC with Differential/Platelet, Comprehensive metabolic panel with GFR, Uric acid  Medication monitoring encounter -Allopurinol  300 mg 1 tablet daily Uric acid WNL--3.9 on 06/07/23.  CBC and CMP updated on 2/625-WNL.  Plan to update lab work today.  Plan: CBC with Differential/Platelet, Comprehensive metabolic panel with GFR, Uric acid  Primary osteoarthritis of both hands: PIP DIP thickening consistent with osteoarthritis of both hands.  No synovitis noted.  Complete fist formation bilaterally.  Primary osteoarthritis of both knees: She has good range of motion of both knee joints on examination today.  She had both knee joints injected 2 weeks apart in June 2025 and has had a significant benefit.  Her mobility has improved.  No effusion noted on examination today.  Primary osteoarthritis of both feet: She is not experiencing any discomfort in her feet at this time.  She has good range of motion of both ankle joints.  Pedal edema noted in bilateral lower extremities.  DDD (degenerative disc disease), cervical: C-spine has good flexion and extension.  Slightly limited ROM with lateral rotation.   Degeneration of intervertebral disc of lumbar region without  discogenic back pain or lower extremity pain: No symptoms of radiculopathy.   Osteopenia of multiple sites - DEXA updated on 11/24/21: Left femoral neck BMD 0.796 with T-score -1.1--ordered by Dr. Jarold.   Scheduled for updated DEXA on 12/06/2023.  Other medical conditions are listed as follows:  History of diabetes mellitus  History of hypertension: Blood pressure was 114/74 today in the office.  History of hyperlipidemia  History of humerus fracture  Orders: Orders Placed This Encounter  Procedures   CBC with Differential/Platelet   Comprehensive metabolic panel with GFR   Uric acid   No orders of the defined types were placed in this encounter.   Follow-Up Instructions: Return in about 6 months (around 06/04/2024) for Gout.   Waddell CHRISTELLA Craze, PA-C  Note - This record has been created using Dragon software.  Chart creation errors have been sought, but may not always  have been located. Such creation errors do not reflect on  the standard of medical care.

## 2023-11-19 NOTE — Telephone Encounter (Signed)
 Noted. No changes at this time.   Talyn Eddie S Ellon Marasco, NP

## 2023-11-20 ENCOUNTER — Other Ambulatory Visit (HOSPITAL_BASED_OUTPATIENT_CLINIC_OR_DEPARTMENT_OTHER): Payer: Self-pay | Admitting: Cardiology

## 2023-11-20 DIAGNOSIS — I10 Essential (primary) hypertension: Secondary | ICD-10-CM

## 2023-12-03 ENCOUNTER — Encounter: Payer: Self-pay | Admitting: Physician Assistant

## 2023-12-03 ENCOUNTER — Ambulatory Visit: Attending: Physician Assistant | Admitting: Physician Assistant

## 2023-12-03 VITALS — BP 114/74 | HR 69 | Resp 14 | Ht 65.5 in | Wt 223.0 lb

## 2023-12-03 DIAGNOSIS — Z8781 Personal history of (healed) traumatic fracture: Secondary | ICD-10-CM | POA: Diagnosis not present

## 2023-12-03 DIAGNOSIS — M8589 Other specified disorders of bone density and structure, multiple sites: Secondary | ICD-10-CM | POA: Diagnosis not present

## 2023-12-03 DIAGNOSIS — Z5181 Encounter for therapeutic drug level monitoring: Secondary | ICD-10-CM

## 2023-12-03 DIAGNOSIS — M19072 Primary osteoarthritis, left ankle and foot: Secondary | ICD-10-CM

## 2023-12-03 DIAGNOSIS — M1A09X0 Idiopathic chronic gout, multiple sites, without tophus (tophi): Secondary | ICD-10-CM | POA: Diagnosis not present

## 2023-12-03 DIAGNOSIS — M19041 Primary osteoarthritis, right hand: Secondary | ICD-10-CM

## 2023-12-03 DIAGNOSIS — M51369 Other intervertebral disc degeneration, lumbar region without mention of lumbar back pain or lower extremity pain: Secondary | ICD-10-CM | POA: Diagnosis not present

## 2023-12-03 DIAGNOSIS — M19042 Primary osteoarthritis, left hand: Secondary | ICD-10-CM

## 2023-12-03 DIAGNOSIS — M5030 Other cervical disc degeneration, unspecified cervical region: Secondary | ICD-10-CM | POA: Diagnosis not present

## 2023-12-03 DIAGNOSIS — Z8679 Personal history of other diseases of the circulatory system: Secondary | ICD-10-CM | POA: Diagnosis not present

## 2023-12-03 DIAGNOSIS — M19071 Primary osteoarthritis, right ankle and foot: Secondary | ICD-10-CM | POA: Diagnosis not present

## 2023-12-03 DIAGNOSIS — M170 Bilateral primary osteoarthritis of knee: Secondary | ICD-10-CM | POA: Diagnosis not present

## 2023-12-03 DIAGNOSIS — Z8639 Personal history of other endocrine, nutritional and metabolic disease: Secondary | ICD-10-CM | POA: Diagnosis not present

## 2023-12-03 LAB — CBC WITH DIFFERENTIAL/PLATELET
Absolute Lymphocytes: 2624 {cells}/uL (ref 850–3900)
Absolute Monocytes: 729 {cells}/uL (ref 200–950)
Basophils Absolute: 57 {cells}/uL (ref 0–200)
Basophils Relative: 0.7 %
Eosinophils Absolute: 243 {cells}/uL (ref 15–500)
Eosinophils Relative: 3 %
HCT: 40.3 % (ref 35.0–45.0)
Hemoglobin: 12.8 g/dL (ref 11.7–15.5)
MCH: 28 pg (ref 27.0–33.0)
MCHC: 31.8 g/dL — ABNORMAL LOW (ref 32.0–36.0)
MCV: 88.2 fL (ref 80.0–100.0)
MPV: 11.4 fL (ref 7.5–12.5)
Monocytes Relative: 9 %
Neutro Abs: 4447 {cells}/uL (ref 1500–7800)
Neutrophils Relative %: 54.9 %
Platelets: 296 Thousand/uL (ref 140–400)
RBC: 4.57 Million/uL (ref 3.80–5.10)
RDW: 13.9 % (ref 11.0–15.0)
Total Lymphocyte: 32.4 %
WBC: 8.1 Thousand/uL (ref 3.8–10.8)

## 2023-12-03 LAB — COMPREHENSIVE METABOLIC PANEL WITH GFR
AG Ratio: 1.6 (calc) (ref 1.0–2.5)
ALT: 13 U/L (ref 6–29)
AST: 19 U/L (ref 10–35)
Albumin: 4.1 g/dL (ref 3.6–5.1)
Alkaline phosphatase (APISO): 58 U/L (ref 37–153)
BUN: 20 mg/dL (ref 7–25)
CO2: 31 mmol/L (ref 20–32)
Calcium: 9.7 mg/dL (ref 8.6–10.4)
Chloride: 107 mmol/L (ref 98–110)
Creat: 0.99 mg/dL (ref 0.60–1.00)
Globulin: 2.5 g/dL (ref 1.9–3.7)
Glucose, Bld: 76 mg/dL (ref 65–99)
Potassium: 4.6 mmol/L (ref 3.5–5.3)
Sodium: 142 mmol/L (ref 135–146)
Total Bilirubin: 0.4 mg/dL (ref 0.2–1.2)
Total Protein: 6.6 g/dL (ref 6.1–8.1)
eGFR: 60 mL/min/1.73m2 (ref >=60)

## 2023-12-03 LAB — URIC ACID: Uric Acid, Serum: 3.3 mg/dL (ref 2.5–7.0)

## 2023-12-03 NOTE — Progress Notes (Unsigned)
 [facility] [address] [place]. [place] [address] [phone]       OFFICE FOLLOW UP NOTE  Ms. Tereso JAYSON Brewster Date of Birth:  28-Nov-1950 Medical Record Number:  [id number]    Primary neurologist: Dr. Chalice  Reason for visit:  CPAP follow-up    SUBJECTIVE:   CHIEF COMPLAINT:  Chief Complaint  Patient presents with   Follow-up    Pt alone, rm 3. She is doing well. States overall doing well with    Follow-up visit:  Prior visit: 10/25/2022 with Dr. Chalice  Brief HPI:   GEORGETTA Odom is a 73 y.o. female who is followed for OSA on CPAP.  She underwent repeat sleep study 06/2022 which confirmed severe sleep apnea accentuated during REM sleep with total AHI 39.4/h and REM AHI 48.2/h, O2 nadir 76%.  She received new CPAP machine 07/2022.     Interval history:  Patient returns for yearly CPAP compliance visit.  Compliance report as below showing excellent usage and optimal residual AHI. Still has some nights she doesn't sleep well. Will occasionally have leaks but will resolve after repositioning.  She is followed by [facility] [employer] but has been having more issues over the past year since receiving new CPAP machine.  She recently received 3 incorrect masks prior to receiving correct mask. She is considering look into different DME companies but will continue with them for now.  ESS 7/24.        ROS:   14 system review of systems performed and negative with exception of those listed in HPI  PMH:  Past Medical History:  Diagnosis Date   Arthritis    Diabetes mellitus without complication (HCC)    Gout    Hypertension    Partial retinal tear of both eyes without detachment 08/29/2017    PSH:  Past Surgical History:  Procedure Laterality Date   ABDOMINAL HYSTERECTOMY  1991   CARPAL TUNNEL RELEASE Right 2019   COLONOSCOPY     5 between 1994-2010   Left knee surgery  2006   ROTATOR CUFF REPAIR Left 2001   ROTATOR CUFF REPAIR Right 2003   SHOULDER  SURGERY  2001/2003   TONSILECTOMY/ADENOIDECTOMY WITH MYRINGOTOMY  1971    Social History:  Social History   Socioeconomic History   Marital status: Single    Spouse name: Not on file   Number of children: Not on file   Years of education: Not on file   Highest education level: Doctorate  Occupational History   Occupation: retired  Tobacco Use   Smoking status: Never    Passive exposure: Never   Smokeless tobacco: Never  Vaping Use   Vaping status: Never Used  Substance and Sexual Activity   Alcohol use: Yes    Comment: rarely   Drug use: No   Sexual activity: Not Currently    Birth control/protection: None    Comment: Hysterectomy  Other Topics Concern   Not on file  Social History Narrative   Not on file   Social Drivers of Health   Financial Resource Strain: Low Risk  (10/16/2023)   Overall Financial Resource Strain (CARDIA)    Difficulty of Paying Living Expenses: Not hard at all  Food Insecurity: No Food Insecurity (10/16/2023)   Hunger Vital Sign    Worried About Running Out of Food in the Last Year: Never true    Ran Out of Food in the Last Year: Never true  Transportation Needs: No Transportation Needs (10/16/2023)  PRAPARE - Administrator, Civil Service (Medical): No    Lack of Transportation (Non-Medical): No  Physical Activity: Insufficiently Active (10/16/2023)   Exercise Vital Sign    Days of Exercise per Week: 3 days    Minutes of Exercise per Session: 30 min  Stress: No Stress Concern Present (10/16/2023)   [employer] of Occupational Health - Occupational Stress Questionnaire    Feeling of Stress: Not at all  Social Connections: Moderately Integrated (10/16/2023)   Social Connection and Isolation Panel    Frequency of Communication with Friends and Family: More than three times a week    Frequency of Social Gatherings with Friends and Family: Twice a week    Attends Religious Services: More than 4 times per year    Active Member of  [employer] or Organizations: Yes    Attends Engineer, structural: More than 4 times per year    Marital Status: Never married  Intimate Partner Violence: Not At Risk (06/22/2023)   Humiliation, Afraid, Rape, and Kick questionnaire    Fear of Current or Ex-Partner: No    Emotionally Abused: No    Physically Abused: No    Sexually Abused: No    Family History:  Family History  Problem Relation Age of Onset   Heart disease Mother    Hypertension Mother    Diabetes Mother    Arthritis Father    Hypertension Father    Prostate cancer Father    Parkinson's disease Father    Heart disease Father    Sinusitis Sister    Allergic rhinitis Sister    Diabetes Sister    High Cholesterol Sister    Hypertension Sister    Breast cancer Cousin 39   Diabetes Brother    Hypertension Brother    High Cholesterol Brother    Hypertension Brother    Angioedema Neg Hx    Asthma Neg Hx    Eczema Neg Hx    Immunodeficiency Neg Hx    Urticaria Neg Hx    BRCA 1/2 Neg Hx     Medications:   Current Outpatient Medications on File Prior to Visit  Medication Sig Dispense Refill   allopurinol  (ZYLOPRIM ) 300 MG tablet TAKE 1 TABLET BY MOUTH EVERY DAY 90 tablet 0   Ascorbic Acid (VITAMIN C PO) Take 1,500 mg by mouth daily at 6 (six) AM.     atorvastatin  (LIPITOR) 20 MG tablet TAKE 1 TABLET BY MOUTH EVERY DAY 90 tablet 3   B Complex Vitamins (VITAMIN-B COMPLEX) TABS Take by mouth.     BENFOTIAMINE PO Take by mouth 2 (two) times daily.     Calcium  Carb-Cholecalciferol 500-10 MG-MCG TABS Take 1 tablet by mouth daily.     carvedilol  (COREG ) 6.25 MG tablet TAKE 1 TABLET BY MOUTH TWICE A DAY 180 tablet 3   Coenzyme Q10 (COQ10) 200 MG CAPS Take 1 capsule by mouth daily at 6 (six) AM.     colchicine  0.6 MG tablet Take 0.6 mg by mouth as needed (For gout flare up).     furosemide  (LASIX ) 20 MG tablet TAKE 1 TABLET BY MOUTH EVERY DAY 90 tablet 1   magnesium oxide (MAG-OX) 400 MG tablet Take 400 mg by mouth  daily.     olmesartan  (BENICAR ) 40 MG tablet Take 0.5 tablets (20 mg total) by mouth daily.     Omega-3 Fatty Acids (OMEGA 3 PO) Take 2,500 mg by mouth daily. 2 tablets by mouth daily  potassium chloride  (MICRO-K ) 10 MEQ CR capsule TAKE 1 CAPSULE BY MOUTH EVERY DAY 90 capsule 1   RYBELSUS  3 MG TABS TAKE 1 TABLET BY MOUTH EVERY MORNING. 90 tablet 1   spironolactone  (ALDACTONE ) 25 MG tablet Take 1 tablet (25 mg total) by mouth daily. 90 tablet 3   triamcinolone  cream (KENALOG ) 0.1 % APPLY TO AFFECTED AREA TWICE DAILY AS NEEDED 45 g 0   TURMERIC CURCUMIN PO Take by mouth daily.     No current facility-administered medications on file prior to visit.    Allergies:   Allergies  Allergen Reactions   Diphenhydramine Other (See Comments)    Temporary Amnesia  diphenhydramine  diphenhydramine hydrochloride   Codeine Other (See Comments)    Hallucinations   Penicillins Hives      OBJECTIVE:  Physical Exam  Vitals:   12/04/23 1035  BP: 119/72  Pulse: 73  Weight: 225 lb (102.1 kg)  Height: 5' 5.5 (1.664 m)   Body mass index is 36.87 kg/m. No results found.   General: well developed, well nourished, very pleasant elderly female, seated, in no evident distress Head: head normocephalic and atraumatic.   Neck: supple with no carotid or supraclavicular bruits Cardiovascular: regular rate and rhythm, no murmurs  Neurologic Exam Mental Status: Awake and fully alert. Oriented to place and time. Recent and remote memory intact. Attention span, concentration and fund of knowledge appropriate. Mood and affect appropriate.  Cranial Nerves: Pupils equal, briskly reactive to light. Extraocular movements full without nystagmus. Visual fields full to confrontation. Hearing intact. Facial sensation intact. Face, tongue, palate moves normally and symmetrically.  Motor: Normal bulk and tone. Normal strength in all tested extremity muscles Gait and Station: Arises from chair without  difficulty. Stance is normal. Gait demonstrates normal stride length and balance without use of AD.         ASSESSMENT/PLAN: [place] Odom is a 73 y.o. year old female    OSA on CPAP :  Compliance report shows satisfactory usage with optimal residual AHI.   Continue current pressure settings 6-16 Discussed continued nightly usage with ensuring greater than 4 hours nightly for optimal benefit and per insurance purposes.   [facility] [employer], Continue to follow with DME company for any needed supplies or CPAP related concerns. Advised to call office if she wishes to transfer DME companies prior to next visit CPAP set up 07/2022     Follow up in 1 year or call earlier if needed   CC:  PCP: Jarold Medici, MD    I personally spent a total of 20 minutes in the care of the patient today including preparing to see the patient, performing a medically appropriate exam/evaluation, counseling and educating, placing orders, and documenting clinical information in the EHR.  Harlene Bogaert, AGNP-BC  [facility] [address] [place], [place] [address]  Phone [phone] Fax [phone] Note: This document was prepared with digital dictation and possible smart phrase technology. Any transcriptional errors that result from this process are unintentional.

## 2023-12-04 ENCOUNTER — Ambulatory Visit: Admitting: Adult Health

## 2023-12-04 ENCOUNTER — Ambulatory Visit: Payer: Medicare HMO | Admitting: Rheumatology

## 2023-12-04 ENCOUNTER — Ambulatory Visit: Payer: Self-pay | Admitting: Physician Assistant

## 2023-12-04 ENCOUNTER — Encounter: Payer: Self-pay | Admitting: Adult Health

## 2023-12-04 VITALS — BP 119/72 | HR 73 | Ht 65.5 in | Wt 225.0 lb

## 2023-12-04 DIAGNOSIS — G4733 Obstructive sleep apnea (adult) (pediatric): Secondary | ICD-10-CM | POA: Diagnosis not present

## 2023-12-04 NOTE — Progress Notes (Signed)
 DME orders faxed to [facility], confirmation received.

## 2023-12-04 NOTE — Patient Instructions (Signed)
 Your Plan:  Continue nightly use of CPAP for adequate sleep apnea management  Continue to follow with DME for any needed supplies or CPAP related concerns      Follow up in 1 year or call earlier if needed     Thank you for coming to see [place] at [facility]. I hope we have been able to provide you high quality care today.  You may receive a patient satisfaction survey over the next few weeks. We would appreciate your feedback and comments so that we may continue to improve ourselves and the health of our patients.

## 2023-12-04 NOTE — Progress Notes (Signed)
CBC and CMP WNL.  Uric acid WNL.

## 2023-12-06 ENCOUNTER — Ambulatory Visit (HOSPITAL_BASED_OUTPATIENT_CLINIC_OR_DEPARTMENT_OTHER)
Admission: RE | Admit: 2023-12-06 | Discharge: 2023-12-06 | Disposition: A | Discharge status: Home / Self Care | Source: Ambulatory Visit | Attending: Internal Medicine | Admitting: Internal Medicine

## 2023-12-06 DIAGNOSIS — M85852 Other specified disorders of bone density and structure, left thigh: Secondary | ICD-10-CM | POA: Diagnosis not present

## 2023-12-13 DIAGNOSIS — M19072 Primary osteoarthritis, left ankle and foot: Secondary | ICD-10-CM | POA: Diagnosis not present

## 2023-12-13 DIAGNOSIS — M19271 Secondary osteoarthritis, right ankle and foot: Secondary | ICD-10-CM | POA: Diagnosis not present

## 2023-12-24 ENCOUNTER — Encounter: Payer: Self-pay | Admitting: Dermatology

## 2023-12-24 NOTE — Telephone Encounter (Signed)
 Hi Jetta,  Can you please call patient and let her know that the smyptoms she's experience sound consistent with the onrmal wound healing process and nerve ergeneration.   Biopsies on the leg notoriously take a long time to heal because of low blood flow.  The photos don't appear to show signs of infection but out of an abundance of caution and for prevention please send an rx of mupirocin  for her to be applied daily after bathing.  -Dr D

## 2023-12-26 ENCOUNTER — Telehealth: Payer: Self-pay

## 2023-12-26 NOTE — Telephone Encounter (Signed)
 LMTCB in regard to wound concerns.

## 2023-12-27 ENCOUNTER — Other Ambulatory Visit: Payer: Self-pay | Admitting: Internal Medicine

## 2023-12-27 ENCOUNTER — Other Ambulatory Visit: Payer: Self-pay

## 2023-12-27 ENCOUNTER — Encounter: Payer: Self-pay | Admitting: Internal Medicine

## 2023-12-27 DIAGNOSIS — T8130XA Disruption of wound, unspecified, initial encounter: Secondary | ICD-10-CM

## 2023-12-27 DIAGNOSIS — R600 Localized edema: Secondary | ICD-10-CM

## 2023-12-27 DIAGNOSIS — I8311 Varicose veins of right lower extremity with inflammation: Secondary | ICD-10-CM

## 2023-12-27 MED ORDER — MUPIROCIN 2 % EX OINT: 1.0000 | TOPICAL_OINTMENT | Freq: Two times a day (BID) | CUTANEOUS | 1 refills | Status: DC

## 2023-12-27 NOTE — Progress Notes (Signed)
 Spoke with patient and advised Dr. Alm reviewed her phots. She reports the area looks completely normal in it's healing process. I advised patient it may help to elevate her legs 5-10 minutes out of the days. I also advised we would send in a rx for Mupirocin  to apply 2 times daily as a precautionary measure. I also recommended to apply ice to the site to help with pain relief. Patient voiced understanding.

## 2024-01-03 DIAGNOSIS — H43813 Vitreous degeneration, bilateral: Secondary | ICD-10-CM | POA: Diagnosis not present

## 2024-01-03 DIAGNOSIS — H35033 Hypertensive retinopathy, bilateral: Secondary | ICD-10-CM | POA: Diagnosis not present

## 2024-01-03 DIAGNOSIS — M19072 Primary osteoarthritis, left ankle and foot: Secondary | ICD-10-CM | POA: Diagnosis not present

## 2024-01-03 DIAGNOSIS — M76821 Posterior tibial tendinitis, right leg: Secondary | ICD-10-CM | POA: Diagnosis not present

## 2024-01-03 DIAGNOSIS — M19071 Primary osteoarthritis, right ankle and foot: Secondary | ICD-10-CM | POA: Diagnosis not present

## 2024-01-03 DIAGNOSIS — M76822 Posterior tibial tendinitis, left leg: Secondary | ICD-10-CM | POA: Diagnosis not present

## 2024-01-03 DIAGNOSIS — M25871 Other specified joint disorders, right ankle and foot: Secondary | ICD-10-CM | POA: Diagnosis not present

## 2024-01-03 DIAGNOSIS — H34832 Tributary (branch) retinal vein occlusion, left eye, with macular edema: Secondary | ICD-10-CM | POA: Diagnosis not present

## 2024-01-03 DIAGNOSIS — M25872 Other specified joint disorders, left ankle and foot: Secondary | ICD-10-CM | POA: Diagnosis not present

## 2024-01-03 DIAGNOSIS — H2513 Age-related nuclear cataract, bilateral: Secondary | ICD-10-CM | POA: Diagnosis not present

## 2024-01-09 DIAGNOSIS — G4733 Obstructive sleep apnea (adult) (pediatric): Secondary | ICD-10-CM | POA: Diagnosis not present

## 2024-01-21 DIAGNOSIS — H34832 Tributary (branch) retinal vein occlusion, left eye, with macular edema: Secondary | ICD-10-CM | POA: Diagnosis not present

## 2024-01-22 ENCOUNTER — Other Ambulatory Visit: Payer: Self-pay | Admitting: Physician Assistant

## 2024-01-22 NOTE — Telephone Encounter (Signed)
 Last Fill: 10/09/2023  Labs: 12/03/2023 CBC and CMP WNL Uric acid WNL  Next Visit: 06/03/2024  Last Visit: 12/03/2023  DX: 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 tophus   Current Dose per office note 12/03/2023: Allopurinol  300 mg 1 tablet daily   Okay to refill Allopurinol ?

## 2024-02-01 ENCOUNTER — Encounter: Payer: Self-pay | Admitting: Internal Medicine

## 2024-02-04 ENCOUNTER — Encounter: Payer: Self-pay | Admitting: Rheumatology

## 2024-02-05 NOTE — Telephone Encounter (Signed)
  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 X-rays demonstrate degenerative changes to bilateral ankle joints, right worse than left. Suspect possible inflammatory component to arthropathy. Recommend following with rheumatology for consideration of medical management given multiple joint involvement including upper extremity. Otherwise recommend anti-inflammatory orals or topicals, ice and supportive shoe gear as tolerated.    Please clarify if she had any relief from the injections?  Please also clarify if her diabetes has been well controlled? Oral prednisone can increase blood glucose and can contribute to weight gain.   I would recommend an evaluation with Dr. Dolphus to rule out another underlying inflammatory process as suggested by the podiatrist.  Additional labs may need to be obtained.

## 2024-02-05 NOTE — Telephone Encounter (Signed)
 Ok to schedule sooner visit for furrther evlauation

## 2024-02-06 NOTE — Progress Notes (Signed)
 Cardiology Office Note:  .    Date:  02/07/2024  ID:  Robin Odom, DOB 1950-12-06, MRN [id number] PCP: Jarold Medici, MD  [facility] Providers Cardiologist:  Shelda Bruckner, MD     History of Present Illness: .    Robin Odom is a 73 y.o. female with a hx of hypertension, asthma, LE edema who is seen for follow up today. I initially met her 08/02/20 as a new consult at the request of Jarold Medici, MD for the evaluation and management of dyspnea on exertion.   CV risk: hypertension, hyperlipidemia, family history of heart disease. OSA on CPAP.  CV history: Treadmill stress with hypertensive response to exercise; echo also with diastolic dysfunction and mildly elevated filling pressure based on tissue doppler. LVEF normal, no significant valve disease.   Today: Has been having a lot of issues with arthritis pain, especially knees, ankles, and feet. Sometimes limits her ability to walk. Has been having more swelling in her ankles with more inflammation. Swelling generally worse on the right side versus the left. Had been improving with elevation until last few months, now more constant. Has appt with vein and vascular in December to evaluate as well. No shortness of breath. Does furosemide  every other day.   Forgot blood pressure log today but reports good numbers--she will send in mychart.  Has had some random bruising, no injuries, they go away in about 5 days except for most recent ones, which has lasted about 3 weeks.  Reviewed recent labs, stable. No melena/hematochezia/hematuria.  ROS:  Denies chest pain, shortness of breath at rest. No PND, orthopnea, or unexpected weight gain. No syncope or palpitations. ROS otherwise negative except as noted.   Studies Reviewed: SABRA         Physical Exam:    VS:  BP 124/68   Pulse 70   Ht 5' 5.5 (1.664 m)   Wt 230 lb (104.3 kg)   SpO2 94%   BMI 37.69 kg/m    Wt Readings from Last 3 Encounters:  02/07/24 230 lb (104.3 kg)   12/04/23 225 lb (102.1 kg)  12/03/23 223 lb (101.2 kg)    GEN: Well nourished, well developed in no acute distress HEENT: Normal, moist mucous membranes NECK: No JVD CARDIAC: regular rhythm, normal S1 and S2, no rubs or gallops. 1/6 systolic murmur. VASCULAR: Radial and DP pulses 2+ bilaterally. No carotid bruits RESPIRATORY:  Clear to auscultation without rales, wheezing or rhonchi  ABDOMEN: Soft, non-tender, non-distended MUSCULOSKELETAL:  Ambulates independently SKIN: Warm and dry, trace to 1+ nonpitting RLE edema. Trivial nonpitting LLE edema. Mild skin discoloration anterior calves. Prominent bilateral ankle bursa NEUROLOGIC:  Alert and oriented x 3. No focal neuro deficits noted. PSYCHIATRIC:  Normal affect   ASSESSMENT AND PLAN: .    Hypertension, with intermittent hypotension: goal <130/80 but systolic >100 -reports that these have been very good and stable recently, will send home numbers -amlodipine  stopped due to hypotension -using furosemide  every other day -continue carvedilol  6.25 mg BID -continue olmesartan  20 mg daily -continue spironolactone  given diastolic dysfunction   Intermittent LE edema -had typically worsened at end of day/improved with elevation, until recently, now more constant -has had more inflammation in her lower extremities with her arthritis recently -no shortness of breath or weight gain. Exam otherwise unremarkable today -suspect this may be component of inflammation and venous insufficiency. If symptoms worsen or no clear etiology found, would repeat echocardiogram -tries to wear compression stockings, difficult to tolerate  all day  Hyperlipidemia: Elevated ASCVD risk score -we discussed her elevated ASCVD risk and recommendations for statins. She declines.   Class 2 obesity Pre-diabetes -working on weight loss, BMI 37 today -on rybelsus   CV risk counseling and prevention -recommend heart healthy/Mediterranean diet, with whole grains,  fruits, vegetable, fish, lean meats, nuts, and olive oil. Limit salt. -recommend moderate walking, 3-5 times/week for 30-50 minutes each session. Aim for at least 150 minutes.week. Goal should be pace of 3 miles/hours, or walking 1.5 miles in 30 minutes -recommend avoidance of tobacco products. Avoid excess alcohol.  Dispo: Follow-up in 3 months or sooner as needed  Signed, Shelda Bruckner, MD

## 2024-02-07 ENCOUNTER — Encounter (HOSPITAL_BASED_OUTPATIENT_CLINIC_OR_DEPARTMENT_OTHER): Payer: Self-pay

## 2024-02-07 ENCOUNTER — Encounter (HOSPITAL_BASED_OUTPATIENT_CLINIC_OR_DEPARTMENT_OTHER): Payer: Self-pay | Admitting: Cardiology

## 2024-02-07 ENCOUNTER — Ambulatory Visit (HOSPITAL_BASED_OUTPATIENT_CLINIC_OR_DEPARTMENT_OTHER): Admitting: Cardiology

## 2024-02-07 VITALS — BP 124/68 | HR 70 | Ht 65.5 in | Wt 230.0 lb

## 2024-02-07 DIAGNOSIS — I10 Essential (primary) hypertension: Secondary | ICD-10-CM | POA: Diagnosis not present

## 2024-02-07 DIAGNOSIS — R600 Localized edema: Secondary | ICD-10-CM

## 2024-02-07 DIAGNOSIS — E66812 Obesity, class 2: Secondary | ICD-10-CM | POA: Diagnosis not present

## 2024-02-07 DIAGNOSIS — Z6837 Body mass index (BMI) 37.0-37.9, adult: Secondary | ICD-10-CM

## 2024-02-07 DIAGNOSIS — E7800 Pure hypercholesterolemia, unspecified: Secondary | ICD-10-CM | POA: Diagnosis not present

## 2024-02-07 DIAGNOSIS — Z9189 Other specified personal risk factors, not elsewhere classified: Secondary | ICD-10-CM

## 2024-02-07 NOTE — Patient Instructions (Signed)
 Medication Instructions:  No changes *If you need a refill on your cardiac medications before your next appointment, please call your pharmacy*  Lab Work: none   Testing/Procedures: none  Follow-Up: At [facility], you and your health needs are our priority.  As part of our continuing mission to provide you with exceptional heart care, our providers are all part of one team.  This team includes your primary Cardiologist (physician) and Advanced Practice Providers or APPs (Physician Assistants and Nurse Practitioners) who all work together to provide you with the care you need, when you need it.  Your next appointment:   3 month(s)  Provider:   Shelda Bruckner, MD, Rosaline Bane, NP, or Reche Finder, NP

## 2024-02-08 NOTE — Progress Notes (Signed)
 Office Visit Note  Patient: Robin Odom             Date of Birth: [birth year]           MRN: [id number]             PCP: Jarold Medici, MD Referring: Jarold Medici, MD Visit Date: 02/11/2024 Occupation: Data Unavailable  Subjective:  Medication management  History of Present Illness: Robin Odom is a 73 y.o. female with gout and osteoarthritis.  She returns today after her last visit in August 2025.  She has not had any gout flare.  She remains on allopurinol  300 mg daily.    No interruption in the allopurinol  reported.  She continues to have pain and discomfort from osteoarthritis which she complaints in her hands, knees, feet and her ankles.  Her right thumb and the right shoulder has been bothering a lot.  She has been having discomfort in her feet and ankles.  She was seen by podiatrist.  She also has varicose veins in her lower extremities.  She has an appointment with the vascular surgeon coming up.  She also has some sciatica symptoms in her lower back.  The neck is currently not bothersome.    Activities of Daily Living:  Patient reports morning stiffness for a few minutes intermittently.   Patient Reports nocturnal pain.  Difficulty dressing/grooming: Denies Difficulty climbing stairs: Reports Difficulty getting out of chair: Reports Difficulty using hands for taps, buttons, cutlery, and/or writing: Reports  Review of Systems  Constitutional:  Positive for fatigue.  HENT:  Negative for mouth sores and mouth dryness.   Eyes:  Positive for dryness.  Respiratory:  Negative for shortness of breath.   Cardiovascular:  Negative for chest pain and palpitations.  Gastrointestinal:  Negative for blood in stool, constipation and diarrhea.  Endocrine: Negative for increased urination.  Genitourinary:  Negative for involuntary urination.  Musculoskeletal:  Positive for joint pain, gait problem, joint pain, joint swelling, myalgias, morning stiffness, muscle tenderness and myalgias.  Negative for muscle weakness.  Skin:  Negative for color change, rash, hair loss and sensitivity to sunlight.  Allergic/Immunologic: Negative for susceptible to infections.  Neurological:  Negative for dizziness and headaches.  Hematological:  Negative for swollen glands.  Psychiatric/Behavioral:  Positive for sleep disturbance. Negative for depressed mood. The patient is not nervous/anxious.     PMFS History:  Patient Active Problem List   Diagnosis Date Noted   Peripheral edema 10/27/2023   Arthralgia 10/27/2023   Menopausal symptom 10/16/2023   Uterine leiomyoma 10/16/2023   Class 2 severe obesity due to excess calories with serious comorbidity and body mass index (BMI) of 38.0 to 38.9 in adult 06/21/2023   Arthritis of ankle or foot, degenerative, left 06/13/2023   Bilateral chronic knee pain 02/28/2023   Encounter for general adult medical examination w/o abnormal findings 02/20/2023   Subclinical hyperthyroidism 02/20/2023   Tibialis posterior tendonitis, right 01/26/2023   Tibialis posterior tendon tear, nontraumatic, right 12/27/2022   Pure hypercholesterolemia 10/26/2022   Skin lesion of right leg 10/26/2022   OSA on CPAP 10/25/2022   Class 2 severe obesity due to excess calories with serious comorbidity and body mass index (BMI) of 37.0 to 37.9 in adult 08/03/2022   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 08/03/2022   Ganglion cyst of both hands 03/13/2022   Toe injury, left, initial encounter 06/27/2021   Bilateral bunions 11/02/2020   History of wheezing 03/18/2020   Osteopenia of multiple sites 10/30/2019  Bunion, right foot 08/13/2018   Prediabetes 05/14/2018   Essential hypertension, benign 05/14/2018   Morbid obesity due to excess calories (HCC) 05/14/2018   Contracture of right Achilles tendon 02/08/2017   Plantar fasciitis of right foot 02/08/2017   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 tophus 07/20/2016   Primary osteoarthritis of both hands 07/20/2016    Primary osteoarthritis of both knees 07/20/2016   Primary osteoarthritis of both feet 07/20/2016   DJD (degenerative joint disease), cervical 07/20/2016   Elevated CK 07/20/2016   History of humerus fracture 07/20/2016   History of hypertension 07/20/2016   History of diabetes mellitus 07/20/2016   History of hyperlipidemia 07/20/2016   Gastroesophageal reflux 12/08/2015   Chronic rhinitis 10/20/2015   Mild intermittent asthma 10/20/2015   Metatarsalgia of both feet 08/31/2015   Pes planus of both feet 08/31/2015   Abnormal glucose 10/05/2012   Arthropathy 10/05/2012   Gout 10/05/2012   Carpal tunnel syndrome 10/12/2009    Past Medical History:  Diagnosis Date   Allergy 1971   penicillium   Arthritis    Carpal tunnel syndrome    left, per patient   Diabetes mellitus without complication (HCC)    Gout    Hyperlipidemia 05/19/2022   Hypertension    Partial retinal tear of both eyes without detachment 08/29/2017   Sleep apnea 1995    Family History  Problem Relation Age of Onset   Heart disease Mother    Hypertension Mother    Diabetes Mother    Arthritis Father    Hypertension Father    Prostate cancer Father    Parkinson's disease Father    Heart disease Father    Cancer Father    Sinusitis Sister    Allergic rhinitis Sister    Diabetes Sister    High Cholesterol Sister    Hypertension Sister    Hyperlipidemia Sister    Diabetes Brother    Hypertension Brother    Anxiety disorder Brother    Asthma Brother    High Cholesterol Brother    Hypertension Brother    Cancer Maternal Aunt    Hypertension Maternal Aunt    Kidney disease Maternal Aunt    Cancer Maternal Uncle    Arthritis Paternal Aunt    Hypertension Paternal Aunt    Cancer Paternal Uncle    Intellectual disability Paternal Uncle    Heart disease Maternal Grandmother    Heart disease Paternal Grandmother    Heart disease Paternal Grandfather    Breast cancer Cousin 35   Angioedema Neg Hx     Eczema Neg Hx    Immunodeficiency Neg Hx    Urticaria Neg Hx    BRCA 1/2 Neg Hx    Past Surgical History:  Procedure Laterality Date   ABDOMINAL HYSTERECTOMY  1991   CARPAL TUNNEL RELEASE Right 2019   COLONOSCOPY     5 between 1994-2010   Left knee surgery  2006   ROTATOR CUFF REPAIR Left 2001   ROTATOR CUFF REPAIR Right 2003   SHOULDER SURGERY  2001/2003   TONSILECTOMY/ADENOIDECTOMY WITH MYRINGOTOMY  1971   Social History   Tobacco Use   Smoking status: Never    Passive exposure: Never   Smokeless tobacco: Never  Vaping Use   Vaping status: Never Used  Substance Use Topics   Alcohol use: Yes    Comment: rarely   Drug use: No   Social History   Social History Narrative   Not on file  Immunization History  Administered Date(s) Administered    sv, Bivalent, Protein Subunit Rsvpref,pf (Abrysvo) 05/23/2022   INFLUENZA, HIGH DOSE SEASONAL PF 02/05/2017, 02/12/2018, 12/16/2018, 01/13/2020, 01/18/2021   Influenza,inj,quad, With Preservative 02/13/2017, 02/01/2018   Influenza-Unspecified 02/03/2015, 01/18/2018, 01/29/2018, 12/16/2018, 01/18/2022, 01/02/2023   PFIZER(Purple Top)SARS-COV-2 Vaccination 01/01/2019, 01/23/2019, 08/19/2019, 08/13/2020   Pfizer Covid-19 Vaccine Bivalent Booster 13yrs & up 04/03/2021, 01/18/2022   Pneumococcal Conjugate-13 01/20/2014   Pneumococcal Polysaccharide-23 10/09/2016   Respiratory Syncytial Virus Vaccine,Recomb Aduvanted(Arexvy) 05/23/2022   Tdap 12/13/2020   Unspecified SARS-COV-2 Vaccination 08/20/2019   Zoster Recombinant(Shingrix) 10/18/2019, 03/05/2020     Objective: Vital Signs: BP 120/81   Pulse 69   Temp (!) 97.2 F (36.2 C)   Resp 15   Ht 5' 5.5 (1.664 m)   Wt 227 lb 9.6 oz (103.2 kg)   BMI 37.30 kg/m    Physical Exam Vitals and nursing note reviewed.  Constitutional:      Appearance: She is well-developed.  HENT:     Head: Normocephalic and atraumatic.  Eyes:     Conjunctiva/sclera: Conjunctivae normal.   Cardiovascular:     Rate and Rhythm: Normal rate and regular rhythm.     Heart sounds: Normal heart sounds.  Pulmonary:     Effort: Pulmonary effort is normal.     Breath sounds: Normal breath sounds.  Abdominal:     General: Bowel sounds are normal.     Palpations: Abdomen is soft.  Musculoskeletal:     Cervical back: Normal range of motion.     Right lower leg: Edema present.     Left lower leg: Edema present.  Lymphadenopathy:     Cervical: No cervical adenopathy.  Skin:    General: Skin is warm and dry.     Capillary Refill: Capillary refill takes less than 2 seconds.  Neurological:     Mental Status: She is alert and oriented to person, place, and time.  Psychiatric:        Behavior: Behavior normal.      Musculoskeletal Exam: Cervical, thoracic and lumbar spine were in good range of motion.  There was no SI joint tenderness.  Shoulder joints, elbow joints, wrist joints, MCPs, PIPs and DIPs were in good range of motion with no synovitis.  Bilateral PIP, and DIP thickening was noted.  Hip joints and knee joints were in good range of motion without any warmth swelling or effusion.  Bilateral pedal edema and varicose veins were noted.  There was no tenderness over ankles or MTPs.  Pes planus and hallux valgus deformities were noted.  CDAI Exam: CDAI Score: -- Patient Global: --; Provider Global: -- Swollen: --; Tender: -- Joint Exam 02/11/2024   No joint exam has been documented for this visit   There is currently no information documented on the homunculus. Go to the Rheumatology activity and complete the homunculus joint exam.  Investigation: No additional findings.  Imaging: No results found.  Recent Labs: Lab Results  Component Value Date   WBC 8.1 12/03/2023   HGB 12.8 12/03/2023   PLT 296 12/03/2023   NA 142 12/03/2023   K 4.6 12/03/2023   CL 107 12/03/2023   CO2 31 12/03/2023   GLUCOSE 76 12/03/2023   BUN 20 12/03/2023   CREATININE 0.99 12/03/2023    BILITOT 0.4 12/03/2023   ALKPHOS 87 06/21/2023   AST 19 12/03/2023   ALT 13 12/03/2023   PROT 6.6 12/03/2023   ALBUMIN 4.1 06/21/2023   CALCIUM  9.7 12/03/2023   GFRAA  77 05/13/2020     Speciality Comments: No specialty comments available.  Procedures:  No procedures performed Allergies: Diphenhydramine, Codeine, and Penicillins   Assessment / Plan:     Visit Diagnoses: 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 tophus -patient denies having a gout flare.  She remains on allopurinol  300 mg 1 tablet by mouth daily.  She has been watching her diet.  Uric acid: 3.3 on 12/03/2023.  Labs were reviewed with the patient.  Will repeat labs again February.- Plan: Uric acid  Medication monitoring encounter - December 03, 2023 CBC and CMP were normal. - Plan: CBC with Differential/Platelet, Comprehensive metabolic panel with GFR February.  Chronic pain of both shoulders-she had good range of motion of bilateral shoulders.  She does history of chronic discomfort.  A handout for shoulder joint exercise was given.  Primary osteoarthritis of both hands-she has bilateral PIP and DIP thickening with no synovitis.  Joint protection muscle strengthening was discussed.  A handout on hand exercises was given.  Primary osteoarthritis of both knees - She had both knee joints injected 2 weeks apart in June 2025 and has had a significant benefit.  She continues to have some knee joint discomfort.  A handout on lower extremity exercise was given.  Primary osteoarthritis of both feet-she has ongoing pain and discomfort in her feet.  She had bilateral pes planus and hallux valgus deformity.  Benefits of orthotics was discussed.  Patient states that her gait is not so stable.  She had physical therapy in the past.  She plans to get physical therapy again through Dr. Everlyn office.  DDD (degenerative disc disease), cervical-Raynauds symptomatic.  Degeneration of intervertebral disc of lumbar region without  discogenic back pain or lower extremity pain-she is intermittent lower back pain.  Osteopenia of multiple sites - DEXA 11/24/21: Left femoral neck BMD 0.796 with T-score -1.1--ordered by Dr. Jarold.December 06, 2023 DEXA scan T-score -1.0 right femoral neck.  No comparison given.  Use of calcium  rich diet and vitamin D  was advised.  Need for regular exercise was advised.  Varicose veins of both lower extremities with pain -patient reports pain in her lower extremities due to varicose veins.  She has an appointment coming up with the vascular surgeon.  Pedal edema-use of compression socks was advised.  History of hypertension-blood pressure was normal at 120/81.  Other medical problems listed as follows:  History of diabetes mellitus  History of humerus fracture  History of hyperlipidemia  Orders: Orders Placed This Encounter  Procedures   CBC with Differential/Platelet   Comprehensive metabolic panel with GFR   Uric acid   No orders of the defined types were placed in this encounter.    Follow-Up Instructions: Return in about 6 months (around 08/11/2024) for Osteoarthritis, Gout.   Maya Nash, MD  Note - This record has been created using Animal nutritionist.  Chart creation errors have been sought, but may not always  have been located. Such creation errors do not reflect on  the standard of medical care.

## 2024-02-11 ENCOUNTER — Ambulatory Visit: Attending: Rheumatology | Admitting: Rheumatology

## 2024-02-11 ENCOUNTER — Encounter: Payer: Self-pay | Admitting: Rheumatology

## 2024-02-11 ENCOUNTER — Other Ambulatory Visit: Payer: Self-pay | Admitting: Internal Medicine

## 2024-02-11 VITALS — BP 120/81 | HR 69 | Temp 97.2°F | Resp 15 | Ht 65.5 in | Wt 227.6 lb

## 2024-02-11 DIAGNOSIS — M8589 Other specified disorders of bone density and structure, multiple sites: Secondary | ICD-10-CM | POA: Diagnosis not present

## 2024-02-11 DIAGNOSIS — M25511 Pain in right shoulder: Secondary | ICD-10-CM

## 2024-02-11 DIAGNOSIS — M19071 Primary osteoarthritis, right ankle and foot: Secondary | ICD-10-CM | POA: Diagnosis not present

## 2024-02-11 DIAGNOSIS — M170 Bilateral primary osteoarthritis of knee: Secondary | ICD-10-CM

## 2024-02-11 DIAGNOSIS — M51369 Other intervertebral disc degeneration, lumbar region without mention of lumbar back pain or lower extremity pain: Secondary | ICD-10-CM | POA: Diagnosis not present

## 2024-02-11 DIAGNOSIS — I83813 Varicose veins of bilateral lower extremities with pain: Secondary | ICD-10-CM

## 2024-02-11 DIAGNOSIS — Z8679 Personal history of other diseases of the circulatory system: Secondary | ICD-10-CM

## 2024-02-11 DIAGNOSIS — Z8781 Personal history of (healed) traumatic fracture: Secondary | ICD-10-CM | POA: Diagnosis not present

## 2024-02-11 DIAGNOSIS — Z8639 Personal history of other endocrine, nutritional and metabolic disease: Secondary | ICD-10-CM

## 2024-02-11 DIAGNOSIS — Z5181 Encounter for therapeutic drug level monitoring: Secondary | ICD-10-CM

## 2024-02-11 DIAGNOSIS — M5030 Other cervical disc degeneration, unspecified cervical region: Secondary | ICD-10-CM | POA: Diagnosis not present

## 2024-02-11 DIAGNOSIS — Z1231 Encounter for screening mammogram for malignant neoplasm of breast: Secondary | ICD-10-CM

## 2024-02-11 DIAGNOSIS — G8929 Other chronic pain: Secondary | ICD-10-CM

## 2024-02-11 DIAGNOSIS — M1A09X0 Idiopathic chronic gout, multiple sites, without tophus (tophi): Secondary | ICD-10-CM

## 2024-02-11 DIAGNOSIS — M19041 Primary osteoarthritis, right hand: Secondary | ICD-10-CM | POA: Diagnosis not present

## 2024-02-11 DIAGNOSIS — M19042 Primary osteoarthritis, left hand: Secondary | ICD-10-CM

## 2024-02-11 DIAGNOSIS — M25512 Pain in left shoulder: Secondary | ICD-10-CM

## 2024-02-11 DIAGNOSIS — R600 Localized edema: Secondary | ICD-10-CM

## 2024-02-11 DIAGNOSIS — M19072 Primary osteoarthritis, left ankle and foot: Secondary | ICD-10-CM

## 2024-02-11 NOTE — Patient Instructions (Addendum)
 Hand Exercises Hand exercises can be helpful for almost anyone. They can strengthen your hands and improve flexibility and movement. The exercises can also increase blood flow to the hands. These results can make your work and daily tasks easier for you. Hand exercises can be especially helpful for people who have joint pain from arthritis or nerve damage from using their hands over and over. These exercises can also help people who injure a hand. Exercises Most of these hand exercises are gentle stretching and motion exercises. It is usually safe to do them often throughout the day. Warming up your hands before exercise may help reduce stiffness. You can do this with gentle massage or by placing your hands in warm water for 10-15 minutes. It is normal to feel some stretching, pulling, tightness, or mild discomfort when you begin new exercises. In time, this will improve. Remember to always be careful and stop right away if you feel sudden, very bad pain or your pain gets worse. You want to get better and be safe. Ask your health care provider which exercises are safe for you. Do exercises exactly as told by your provider and adjust them as told. Do not begin these exercises until told by your provider. Knuckle bend or claw fist  Stand or sit with your arm, hand, and all five fingers pointed straight up. Make sure to keep your wrist straight. Gently bend your fingers down toward your palm until the tips of your fingers are touching your palm. Keep your big knuckle straight and only bend the small knuckles in your fingers. Hold this position for 10 seconds. Straighten your fingers back to your starting position. Repeat this exercise 5-10 times with each hand. Full finger fist  Stand or sit with your arm, hand, and all five fingers pointed straight up. Make sure to keep your wrist straight. Gently bend your fingers into your palm until the tips of your fingers are touching the middle of your  palm. Hold this position for 10 seconds. Extend your fingers back to your starting position, stretching every joint fully. Repeat this exercise 5-10 times with each hand. Straight fist  Stand or sit with your arm, hand, and all five fingers pointed straight up. Make sure to keep your wrist straight. Gently bend your fingers at the big knuckle, where your fingers meet your hand, and at the middle knuckle. Keep the knuckle at the tips of your fingers straight and try to touch the bottom of your palm. Hold this position for 10 seconds. Extend your fingers back to your starting position, stretching every joint fully. Repeat this exercise 5-10 times with each hand. Tabletop  Stand or sit with your arm, hand, and all five fingers pointed straight up. Make sure to keep your wrist straight. Gently bend your fingers at the big knuckle, where your fingers meet your hand, as far down as you can. Keep the small knuckles in your fingers straight. Think of forming a tabletop with your fingers. Hold this position for 10 seconds. Extend your fingers back to your starting position, stretching every joint fully. Repeat this exercise 5-10 times with each hand. Finger spread  Place your hand flat on a table with your palm facing down. Make sure your wrist stays straight. Spread your fingers and thumb apart from each other as far as you can until you feel a gentle stretch. Hold this position for 10 seconds. Bring your fingers and thumb tight together again. Hold this position for 10 seconds. Repeat  this exercise 5-10 times with each hand. Making circles  Stand or sit with your arm, hand, and all five fingers pointed straight up. Make sure to keep your wrist straight. Make a circle by touching the tip of your thumb to the tip of your index finger. Hold for 10 seconds. Then open your hand wide. Repeat this motion with your thumb and each of your fingers. Repeat this exercise 5-10 times with each hand. Thumb  motion  Sit with your forearm resting on a table and your wrist straight. Your thumb should be facing up toward the ceiling. Keep your fingers relaxed as you move your thumb. Lift your thumb up as high as you can toward the ceiling. Hold for 10 seconds. Bend your thumb across your palm as far as you can, reaching the tip of your thumb for the small finger (pinkie) side of your palm. Hold for 10 seconds. Repeat this exercise 5-10 times with each hand. Grip strengthening  Hold a stress ball or other soft ball in the middle of your hand. Slowly increase the pressure, squeezing the ball as much as you can without causing pain. Think of bringing the tips of your fingers into the middle of your palm. All of your finger joints should bend when doing this exercise. Hold your squeeze for 10 seconds, then relax. Repeat this exercise 5-10 times with each hand. Contact a health care provider if: Your hand pain or discomfort gets much worse when you do an exercise. Your hand pain or discomfort does not improve within 2 hours after you exercise. If you have either of these problems, stop doing these exercises right away. Do not do them again unless your provider says that you can. Get help right away if: You develop sudden, severe hand pain or swelling. If this happens, stop doing these exercises right away. Do not do them again unless your provider says that you can. This information is not intended to replace advice given to you by your health care provider. Make sure you discuss any questions you have with your health care provider. Document Revised: 05/02/2022 Document Reviewed: 05/02/2022 [employer] Patient Education  2024 [employer]    Shoulder Exercises Ask your health care provider which exercises are safe for you. Do exercises exactly as told by your health care provider and adjust them as directed. It is normal to feel mild stretching, pulling, tightness, or discomfort as you do these  exercises. Stop right away if you feel sudden pain or your pain gets worse. Do not begin these exercises until told by your health care provider. Stretching exercises External rotation and abduction This exercise is sometimes called corner stretch. The exercise rotates your arm outward (external rotation) and moves your arm out from your body (abduction). Stand in a doorway with one of your feet slightly in front of the other. This is called a staggered stance. If you cannot reach your forearms to the door frame, stand facing a corner of a room. Choose one of the following positions as told by your health care provider: Place your hands and forearms on the door frame above your head. Place your hands and forearms on the door frame at the height of your head. Place your hands on the door frame at the height of your elbows. Slowly move your weight onto your front foot until you feel a stretch across your chest and in the front of your shoulders. Keep your head and chest upright and keep your abdominal muscles tight.  Hold for __________ seconds. To release the stretch, shift your weight to your back foot. Repeat __________ times. Complete this exercise __________ times a day. Extension, standing  Stand and hold a broomstick, a cane, or a similar object behind your back. Your hands should be a little wider than shoulder-width apart. Your palms should face away from your back. Keeping your elbows straight and your shoulder muscles relaxed, move the stick away from your body until you feel a stretch in your shoulders (extension). Avoid shrugging your shoulders while you move the stick. Keep your shoulder blades tucked down toward the middle of your back. Hold for __________ seconds. Slowly return to the starting position. Repeat __________ times. Complete this exercise __________ times a day. Range-of-motion exercises Pendulum  Stand near a wall or a surface that you can hold onto for  balance. Bend at the waist and let your left / right arm hang straight down. Use your other arm to support you. Keep your back straight and do not lock your knees. Relax your left / right arm and shoulder muscles, and move your hips and your trunk so your left / right arm swings freely. Your arm should swing because of the motion of your body, not because you are using your arm or shoulder muscles. Keep moving your hips and trunk so your arm swings in the following directions, as told by your health care provider: Side to side. Forward and backward. In clockwise and counterclockwise circles. Continue each motion for __________ seconds, or for as long as told by your health care provider. Slowly return to the starting position. Repeat __________ times. Complete this exercise __________ times a day. Shoulder flexion, standing  Stand and hold a broomstick, a cane, or a similar object. Place your hands a little more than shoulder-width apart on the object. Your left / right hand should be palm-up, and your other hand should be palm-down. Keep your elbow straight and your shoulder muscles relaxed. Push the stick up with your healthy arm to raise your left / right arm in front of your body, and then over your head until you feel a stretch in your shoulder (flexion). Avoid shrugging your shoulder while you raise your arm. Keep your shoulder blade tucked down toward the middle of your back. Hold for __________ seconds. Slowly return to the starting position. Repeat __________ times. Complete this exercise __________ times a day. Shoulder abduction, standing  Stand and hold a broomstick, a cane, or a similar object. Place your hands a little more than shoulder-width apart on the object. Your left / right hand should be palm-up, and your other hand should be palm-down. Keep your elbow straight and your shoulder muscles relaxed. Push the object across your body toward your left / right side. Raise your  left / right arm to the side of your body (abduction) until you feel a stretch in your shoulder. Do not raise your arm above shoulder height unless your health care provider tells you to do that. If directed, raise your arm over your head. Avoid shrugging your shoulder while you raise your arm. Keep your shoulder blade tucked down toward the middle of your back. Hold for __________ seconds. Slowly return to the starting position. Repeat __________ times. Complete this exercise __________ times a day. Internal rotation  Place your left / right hand behind your back, palm-up. Use your other hand to dangle an exercise band, a broomstick, or a similar object over your shoulder. Grasp the band with your  left / right hand so you are holding on to both ends. Gently pull up on the band until you feel a stretch in the front of your left / right shoulder. The movement of your arm toward the center of your body is called internal rotation. Avoid shrugging your shoulder while you raise your arm. Keep your shoulder blade tucked down toward the middle of your back. Hold for __________ seconds. Release the stretch by letting go of the band and lowering your hands. Repeat __________ times. Complete this exercise __________ times a day. Strengthening exercises External rotation  Sit in a stable chair without armrests. Secure an exercise band to a stable object at elbow height on your left / right side. Place a soft object, such as a folded towel or a small pillow, between your left / right upper arm and your body to move your elbow about 4 inches (10 cm) away from your side. Hold the end of the exercise band so it is tight and there is no slack. Keeping your elbow pressed against the soft object, slowly move your forearm out, away from your abdomen (external rotation). Keep your body steady so only your forearm moves. Hold for __________ seconds. Slowly return to the starting position. Repeat __________  times. Complete this exercise __________ times a day. Shoulder abduction  Sit in a stable chair without armrests, or stand up. Hold a __________ lb / kg weight in your left / right hand, or hold an exercise band with both hands. Start with your arms straight down and your left / right palm facing in, toward your body. Slowly lift your left / right hand out to your side (abduction). Do not lift your hand above shoulder height unless your health care provider tells you that this is safe. Keep your arms straight. Avoid shrugging your shoulder while you do this movement. Keep your shoulder blade tucked down toward the middle of your back. Hold for __________ seconds. Slowly lower your arm, and return to the starting position. Repeat __________ times. Complete this exercise __________ times a day. Shoulder extension  Sit in a stable chair without armrests, or stand up. Secure an exercise band to a stable object in front of you so it is at shoulder height. Hold one end of the exercise band in each hand. Straighten your elbows and lift your hands up to shoulder height. Squeeze your shoulder blades together as you pull your hands down to the sides of your thighs (extension). Stop when your hands are straight down by your sides. Do not let your hands go behind your body. Hold for __________ seconds. Slowly return to the starting position. Repeat __________ times. Complete this exercise __________ times a day. Shoulder row  Sit in a stable chair without armrests, or stand up. Secure an exercise band to a stable object in front of you so it is at chest height. Hold one end of the exercise band in each hand. Position your palms so that your thumbs are facing the ceiling (neutral position). Bend each of your elbows to a 90-degree angle (right angle) and keep your upper arms at your sides. Step back or move the chair back until the band is tight and there is no slack. Slowly pull your elbows back  behind you. Hold for __________ seconds. Slowly return to the starting position. Repeat __________ times. Complete this exercise __________ times a day. Shoulder press-ups  Sit in a stable chair that has armrests. Sit upright, with your feet flat  on the floor. Put your hands on the armrests so your elbows are bent and your fingers are pointing forward. Your hands should be about even with the sides of your body. Push down on the armrests and use your arms to lift yourself off the chair. Straighten your elbows and lift yourself up as much as you comfortably can. Move your shoulder blades down, and avoid letting your shoulders move up toward your ears. Keep your feet on the ground. As you get stronger, your feet should support less of your body weight as you lift yourself up. Hold for __________ seconds. Slowly lower yourself back into the chair. Repeat __________ times. Complete this exercise __________ times a day. Wall push-ups      Exercises for Chronic Knee Pain Chronic knee pain is pain that lasts longer than 3 months. For most people with chronic knee pain, exercise and weight loss is an important part of treatment. Your health care provider may want you to focus on: Making the muscles that support your knee stronger. This can take pressure off your knee and reduce pain. Preventing knee stiffness. How far you can move your knee, keeping it there or making it farther. Losing weight (if this applies) to take pressure off your knee, lower your risk for injury, and make it easier for you to exercise. Your provider will help you make an exercise program that fits your needs and physical abilities. Below are simple, low-impact exercises you can do at home. Ask your provider or physical therapist how often you should do your exercise program and how many times to repeat each exercise. General safety tips  Get your provider's approval before doing any exercises. Start slowly and stop any  time you feel pain. Do not exercise if your knee pain is flaring up. Warm up first. Stretching a cold muscle can cause an injury. Do 5-10 minutes of easy movement or light stretching before beginning your exercises. Do 5-10 minutes of low-impact activity (like walking or cycling) before starting strengthening exercises. Contact your provider any time you have pain during or after exercising. Exercise can cause discomfort but should not be painful. It is normal to be a little stiff or sore after exercising. Stretching and range-of-motion exercises Front thigh stretch  Stand up straight and support your body by holding on to a chair or resting one hand on a wall. With your legs straight and close together, bend one knee to lift your heel up toward your butt. Using one hand for support, grab your ankle with your free hand. Pull your foot up closer toward your butt to feel the stretch in front of your thigh. Hold the stretch for 30 seconds. Repeat __________ times. Complete this exercise __________ times a day. Back thigh stretch  Sit on the floor with your back straight and your legs out straight in front of you. Place the palms of your hands on the floor and slide them toward your feet as you bend at the hip. Try to touch your nose to your knees and feel the stretch in the back of your thighs. Hold for 30 seconds. Repeat __________ times. Complete this exercise __________ times a day. Calf stretch  Stand facing a wall. Place the palms of your hands flat against the wall, arms extended, and lean slightly against the wall. Get into a lunge position with one leg bent at the knee and the other leg stretched out straight behind you. Keep both feet facing the wall and increase the  bend in your knee while keeping the heel of the other leg flat on the ground. You should feel the stretch in your calf. Hold for 30 seconds. Repeat __________ times. Complete this exercise __________ times a  day. Strengthening exercises Straight leg lift  Lie on your back with one knee bent and the other leg out straight. Slowly lift the straight leg without bending the knee. Lift until your foot is about 12 inches (30 cm) off the floor. Hold for 3-5 seconds and slowly lower your leg. Repeat __________ times. Complete this exercise __________ times a day. Single leg dip  Stand between two chairs and put both hands on the backs of the chairs for support. Extend one leg out straight with your body weight resting on the heel of the standing leg. Slowly bend your standing knee to dip your body to the level that is comfortable for you. Hold for 3-5 seconds. Repeat __________ times. Complete this exercise __________ times a day. Hamstring curls  Stand straight, knees close together, facing the back of a chair. Hold on to the back of a chair with both hands. Keep one leg straight. Bend the other knee while bringing the heel up toward the butt until the knee is bent at a 90-degree angle (right angle). Hold for 3-5 seconds. Repeat __________ times. Complete this exercise __________ times a day. Wall squat  Stand straight with your back, hips, and head against a wall. Step forward one foot at a time with your back still against the wall. Your feet should be 2 feet (61 cm) from the wall at shoulder width. Keeping your back, hips, and head against the wall, slide down the wall to as close to a sitting position as you can get. Hold for 5-10 seconds, then slowly slide back up. Repeat __________ times. Complete this exercise __________ times a day. Step-ups  Stand in front of a sturdy platform or stool that is about 6 inches (15 cm) high. Slowly step up with your left / right foot, keeping your knee in line with your hip and foot. Do not let your knee bend so far that you cannot see your toes. Hold on to a chair for balance, but do not use it for support. Slowly unlock your knee and lower yourself to  the starting position. Repeat __________ times. Complete this exercise __________ times a day. Contact a health care provider if: Your exercises cause pain. Your pain is worse after you exercise. Your pain prevents you from doing your exercises. This information is not intended to replace advice given to you by your health care provider. Make sure you discuss any questions you have with your health care provider. Document Revised: 05/02/2022 Document Reviewed: 05/02/2022 Elsevier Patient Education  2024 [employer] Stand so you are facing a stable wall. Your feet should be about one arm-length away from the wall. Lean forward and place your palms on the wall at shoulder height. Keep your feet flat on the floor as you bend your elbows and lean forward toward the wall. Hold for __________ seconds. Straighten your elbows to push yourself back to the starting position. Repeat __________ times. Complete this exercise __________ times a day. This information is not intended to replace advice given to you by your health care provider. Make sure you discuss any questions you have with your health care provider. Document Revised: 06/07/2021 Document Reviewed: 06/07/2021 Elsevier Patient Education  2024 [employer]   Standing Labs We placed an order today for  your standing lab work.   Please have your standing labs drawn in February  Please have your labs drawn 2 weeks prior to your appointment so that the provider can discuss your lab results at your appointment, if possible.  Please note that you may see your imaging and lab results in MyChart before we have reviewed them. We will contact you once all results are reviewed. Please allow our office up to 72 hours to thoroughly review all of the results before contacting the office for clarification of your results.  WALK-IN LAB HOURS  Monday through Thursday from 8:00 am -12:30 pm and 1:00 pm-4:30 pm and Friday from 8:00 am-12:00 pm.   Patients with office visits requiring labs will be seen before walk-in labs.  You may encounter longer than normal wait times. Please allow additional time. Wait times may be shorter on  Monday and Thursday afternoons.  We do not book appointments for walk-in labs. We appreciate your patience and understanding with our staff.   Labs are drawn by [facility]. Please bring your co-pay at the time of your lab draw.  You may receive a bill from [facility] for your lab work.  Please note if you are on Hydroxychloroquine and and an order has been placed for a Hydroxychloroquine level,  you will need to have it drawn 4 hours or more after your last dose.  If you wish to have your labs drawn at another location, please call the office 24 hours in advance so we can fax the orders.  The office is located at [address], [address], [place], [place] [address]   If you have any questions regarding directions or hours of operation,  please call [phone].   As a reminder, please drink plenty of water prior to coming for your lab work. Thanks!

## 2024-02-19 ENCOUNTER — Telehealth: Payer: Self-pay | Admitting: Pharmacist

## 2024-02-19 NOTE — Progress Notes (Signed)
   02/19/2024  Patient ID: Robin Odom, female   DOB: [birth year], 73 y.o.   MRN: [id number]  Called Patient to discuss patient assistance for 2026. Unfortunately, the number listed for her is not operational. Novo Nordisk was called.  She is over income to receive Rybelsus  through [employer] Patient assistance program.   On statin- atorvastatin  lf 01/10/24 90 day supply On Olmesartan - lf 01/09/24 90 day supply Lipid Panel     Component Value Date/Time   CHOL 128 06/21/2023 1456   TRIG 53 06/21/2023 1456   HDL 54 06/21/2023 1456   CHOLHDL 2.4 06/21/2023 1456   LDLCALC 62 06/21/2023 1456   LABVLDL 12 06/21/2023 1456    Lab Results  Component Value Date   HGBA1C 5.8 (H) 09/21/2023   HGBA1C 6.0 (H) 06/21/2023   HGBA1C 6.1 (H) 02/20/2023    Rybelsus  3 mg lf 02/12/24 90 day supply  Robin Odom, PharmD, BCACP Clinical Pharmacist [phone]

## 2024-02-20 ENCOUNTER — Encounter: Payer: Self-pay | Admitting: Internal Medicine

## 2024-02-21 ENCOUNTER — Encounter: Payer: Self-pay | Admitting: Internal Medicine

## 2024-02-21 ENCOUNTER — Ambulatory Visit (INDEPENDENT_AMBULATORY_CARE_PROVIDER_SITE_OTHER): Admitting: Internal Medicine

## 2024-02-21 VITALS — BP 132/80 | HR 66 | Temp 98.1°F | Ht 65.0 in | Wt 227.6 lb

## 2024-02-21 DIAGNOSIS — E66812 Obesity, class 2: Secondary | ICD-10-CM

## 2024-02-21 DIAGNOSIS — E0590 Thyrotoxicosis, unspecified without thyrotoxic crisis or storm: Secondary | ICD-10-CM | POA: Diagnosis not present

## 2024-02-21 DIAGNOSIS — G4700 Insomnia, unspecified: Secondary | ICD-10-CM | POA: Diagnosis not present

## 2024-02-21 DIAGNOSIS — G5603 Carpal tunnel syndrome, bilateral upper limbs: Secondary | ICD-10-CM

## 2024-02-21 DIAGNOSIS — M5431 Sciatica, right side: Secondary | ICD-10-CM | POA: Diagnosis not present

## 2024-02-21 DIAGNOSIS — R7303 Prediabetes: Secondary | ICD-10-CM | POA: Diagnosis not present

## 2024-02-21 DIAGNOSIS — I10 Essential (primary) hypertension: Secondary | ICD-10-CM

## 2024-02-21 DIAGNOSIS — Z79899 Other long term (current) drug therapy: Secondary | ICD-10-CM

## 2024-02-21 DIAGNOSIS — M5412 Radiculopathy, cervical region: Secondary | ICD-10-CM

## 2024-02-21 DIAGNOSIS — Z6837 Body mass index (BMI) 37.0-37.9, adult: Secondary | ICD-10-CM

## 2024-02-21 DIAGNOSIS — M2550 Pain in unspecified joint: Secondary | ICD-10-CM | POA: Diagnosis not present

## 2024-02-21 DIAGNOSIS — R202 Paresthesia of skin: Secondary | ICD-10-CM | POA: Diagnosis not present

## 2024-02-21 DIAGNOSIS — H348322 Tributary (branch) retinal vein occlusion, left eye, stable: Secondary | ICD-10-CM | POA: Diagnosis not present

## 2024-02-21 MED ORDER — MELOXICAM 7.5 MG PO TABS: ORAL_TABLET | ORAL | 2 refills | Status: AC

## 2024-02-21 MED ORDER — TIZANIDINE HCL 4 MG PO TABS: ORAL_TABLET | ORAL | 1 refills | Status: DC

## 2024-02-21 NOTE — Patient Instructions (Signed)
 Prediabetes: What to Know Prediabetes is when your blood sugar, also called glucose, is at a higher level than normal but not high enough for you to be diagnosed with type 2 diabetes (type 2 diabetes mellitus). Having prediabetes puts you at risk for getting type 2 diabetes. By making some healthy changes, you may be able to prevent or delay getting type 2 diabetes. This is important because type 2 diabetes can lead to serious problems. Some of these include: Heart disease. Stroke. Blindness. Kidney disease. Depression. Poor blood flow in the feet and legs. In very bad cases, this could lead to having a leg removed by surgery (amputation). What are the causes? The exact cause of prediabetes isn't known. It may result from insulin resistance. Insulin resistance happens when cells in the body don't respond properly to insulin that the body makes. This can cause too much sugar to build up in the blood. High blood sugar, also called hyperglycemia, can develop. What increases the risk? Having a family member with type 2 diabetes. Being older than 74 years of age. Having had a temporary form of diabetes during a pregnancy. This is called gestational diabetes. Having had polycystic ovary syndrome (PCOS). Being overweight or obese. Being inactive and not getting much exercise. Having a history of heart disease. This may include problems with cholesterol levels, high levels of blood fats, or high blood pressure. What are the signs or symptoms? You may have no symptoms. If you do have symptoms, they may include: Increased hunger. Increased thirst. Needing to pee more often. Changes in how you see, like blurry vision. Feeling tired. How is this diagnosed? Prediabetes can be diagnosed with blood tests that check your blood sugar. One or more of these tests may be done: A fasting blood glucose (FBG) test. You won't be allowed to eat (you will fast) for at least 8 hours before a blood sample is  taken. An A1C blood test, also called a hemoglobin A1C test. This test shows information about blood sugar levels over the past 2?3 months. An oral glucose tolerance test (OGTT). This test measures your blood sugar at two points in time: After you haven't eaten for a while. This is your baseline level. Two hours after you drink a beverage that has sugar in it. You may be diagnosed with prediabetes if: Your FBG is 100?125 mg/dL (1.6-1.0 mmol/L). Your A1C level is 5.7?6.4% (39-46 mmol/mol). Your OGTT result is 140?199 mg/dL (9.6-04 mmol/L). These blood tests may need to be done again to be sure of the diagnosis. How is this treated? Treatment may include making changes to your diet and lifestyle. These changes can help lower your blood sugar and keep you from getting type 2 diabetes. In some cases, medicine may be given to help lower your risk. Follow these instructions at home: Eating and drinking  Eat and drink as told. Follow a healthy meal plan. This includes eating lean proteins, whole grains, legumes, fresh fruits and vegetables, low-fat dairy products, and healthy fats. Meet with an expert in healthy eating called a dietitian. This person can help create a healthy eating plan that's right for you. Lifestyle Do moderate-intensity exercise. Do this for at least 30 minutes a day on 5 or more days each week, or as told by your health care provider. A mix of activities may be best. Good choices include brisk walking, swimming, biking, and weight lifting. Try to lose weight if your provider says it's OK. Losing 5-7% of your body weight can  help reverse insulin resistance. Do not drink alcohol if: Your provider tells you not to drink. You're pregnant, may be pregnant, or plan to become pregnant. If you drink alcohol: Limit how much you have to: 0-1 drink a day if you're female. 0-2 drinks a day if you're female. Know how much alcohol is in your drink. In the [place] one drink is one 12 oz  bottle of beer (355 mL), one 5 oz glass of wine (148 mL), or one 1 oz glass of hard liquor (44 mL). General instructions Take medicines only as told. You may be given medicines that help lower the risk of type 2 diabetes. Do not smoke, vape, or use nicotine or tobacco. Where to find more information American Diabetes Association: diabetes.org/about-diabetes/prediabetes Academy of Nutrition and Dietetics: eatright.org American Heart Association: Go to ThisJobs.cz. Click the search icon. Type "prediabetes" in the search box. Contact a health care provider if: You have any of these symptoms: Increased hunger. Peeing more often than usual. Increased thirst. Feeling tired. Changes in how you see, like blurry vision. Feeling like you may throw up. Throwing up. Get help right away if: You have shortness of breath. You feel confused. This information is not intended to replace advice given to you by your health care provider. Make sure you discuss any questions you have with your health care provider. Document Revised: 11/19/2022 Document Reviewed: 11/19/2022 Elsevier Patient Education  2024 [employer].

## 2024-02-21 NOTE — Assessment & Plan Note (Signed)
 Previous labs reviewed, her A1c has been elevated in the past. I will check an A1c today. Reminded to avoid refined sugars including sugary drinks/foods and processed meats including bacon, sausages and deli meats.  She is currently on Rybelsus to address insulin resistance.

## 2024-02-21 NOTE — Progress Notes (Addendum)
 I,Robin Odom, CMA,acting as a neurosurgeon for Robin LOISE Slocumb, MD.,have documented all relevant documentation on the behalf of Robin LOISE Slocumb, MD,as directed by  Robin LOISE Slocumb, MD while in the presence of Robin LOISE Slocumb, MD.  Subjective:  Patient ID: Robin Odom , female    DOB: 01-26-1951 , 73 y.o.   MRN: [id number]  Chief Complaint  Patient presents with   Prediabetes    She is here today for a prediabetes/HTN check. Patient reports compliance with her medications. She denies headaches, chest pain and shortness of breath.  She complains of generalized body pain. She knows this is due to arthritis. She admits today is an extremely bad day. She is scheduled to see orthopedic on Tuesday.    Hypertension    HPI Discussed the use of AI scribe software for clinical note transcription with the patient, who gave verbal consent to proceed.  History of Present Illness Robin Odom is a 73 year old female who presents for management of prediabetes and high blood pressure.  She c/o arthritis pain.  She experiences significant arthritis pain, particularly in her feet, diagnosed by her podiatrist. The pain is severe, rated at 10+ on bad days, and affects her ability to walk, sometimes requiring her to walk on the sides of her feet. This level of pain has persisted since July and is exacerbated by weather changes. Ibuprofen has been largely ineffective for pain relief. She has previously received cortisone injections, which provided temporary relief.  She has a history of retinal vein occlusion diagnosed by her eye doctor, for which she has received injections and a recent laser procedure. Initially, she had no symptoms but now experiences fatigue in the affected eye. The cataract in the same eye is currently too small to address.  She experiences burning sensations under her big toe and tingling in her feet and back. She has a history of carpal tunnel syndrome diagnosed in 2018, with moderate to  severe symptoms in both hands. She wears a brace at night.  She reports issues with balance and exhaustion, attributing some of her symptoms to lack of sleep. She uses a CPAP machine but notes her usage has been inconsistent, with effectiveness below the desired threshold. She is actively involved in church activities, which she enjoys but acknowledges contribute to her fatigue.  She has a family history of degenerative disc disease, with her sister having undergone multiple surgeries. She has been diagnosed with this condition in the past and is considering further evaluation due to ongoing symptoms.   Hypertension This is a chronic problem. The current episode started yesterday. The problem has been gradually improving since onset. Risk factors for coronary artery disease include obesity and post-menopausal state. Past treatments include direct vasodilators, angiotensin blockers, diuretics and lifestyle changes.     Past Medical History:  Diagnosis Date   Allergy 1971   penicillium   Arthritis    Carpal tunnel syndrome    left, per patient   Diabetes mellitus without complication (HCC)    Gout    Hyperlipidemia 05/19/2022   Hypertension    Partial retinal tear of both eyes without detachment 08/29/2017   Sleep apnea 1995     Family History  Problem Relation Age of Onset   Heart disease Mother    Hypertension Mother    Diabetes Mother    Arthritis Father    Hypertension Father    Prostate cancer Father    Parkinson's disease Father  Heart disease Father    Cancer Father    Sinusitis Sister    Allergic rhinitis Sister    Diabetes Sister    High Cholesterol Sister    Hypertension Sister    Hyperlipidemia Sister    Diabetes Brother    Hypertension Brother    Anxiety disorder Brother    Asthma Brother    High Cholesterol Brother    Hypertension Brother    Cancer Maternal Aunt    Hypertension Maternal Aunt    Kidney disease Maternal Aunt    Cancer Maternal Uncle     Arthritis Paternal Aunt    Hypertension Paternal Aunt    Cancer Paternal Uncle    Intellectual disability Paternal Uncle    Heart disease Maternal Grandmother    Heart disease Paternal Grandmother    Heart disease Paternal Grandfather    Breast cancer Cousin 15   Angioedema Neg Hx    Eczema Neg Hx    Immunodeficiency Neg Hx    Urticaria Neg Hx    BRCA 1/2 Neg Hx      Current Outpatient Medications:    allopurinol  (ZYLOPRIM ) 300 MG tablet, TAKE 1 TABLET BY MOUTH EVERY DAY, Disp: 90 tablet, Rfl: 0   Ascorbic Acid (VITAMIN C PO), Take 1,500 mg by mouth daily at 6 (six) AM., Disp: , Rfl:    atorvastatin  (LIPITOR) 20 MG tablet, TAKE 1 TABLET BY MOUTH EVERY DAY, Disp: 90 tablet, Rfl: 3   B Complex Vitamins (VITAMIN-B COMPLEX) TABS, Take by mouth., Disp: , Rfl:    BENFOTIAMINE PO, Take by mouth 2 (two) times daily., Disp: , Rfl:    Calcium  Carb-Cholecalciferol 500-10 MG-MCG TABS, Take 1 tablet by mouth daily., Disp: , Rfl:    carvedilol  (COREG ) 6.25 MG tablet, TAKE 1 TABLET BY MOUTH TWICE A DAY, Disp: 180 tablet, Rfl: 3   Coenzyme Q10 (COQ10) 200 MG CAPS, Take 1 capsule by mouth daily at 6 (six) AM., Disp: , Rfl:    colchicine  0.6 MG tablet, Take 0.6 mg by mouth as needed (For gout flare up)., Disp: , Rfl:    furosemide  (LASIX ) 20 MG tablet, TAKE 1 TABLET BY MOUTH EVERY DAY, Disp: 90 tablet, Rfl: 1   Magnesium Bisglycinate (MAG GLYCINATE PO), Take by mouth daily., Disp: , Rfl:    meloxicam  (MOBIC ) 7.5 MG tablet, One tab po twice daily as needed, Disp: 60 tablet, Rfl: 2   NON FORMULARY, as directed. SUPERBEETS, Disp: , Rfl:    olmesartan  (BENICAR ) 40 MG tablet, Take 0.5 tablets (20 mg total) by mouth daily., Disp: , Rfl:    Omega-3 Fatty Acids (OMEGA 3 PO), Take 2,500 mg by mouth daily. 2 tablets by mouth daily, Disp: , Rfl:    potassium chloride  (MICRO-K ) 10 MEQ CR capsule, TAKE 1 CAPSULE BY MOUTH EVERY DAY, Disp: 90 capsule, Rfl: 1   RYBELSUS  3 MG TABS, TAKE 1 TABLET BY MOUTH EVERY  MORNING., Disp: 90 tablet, Rfl: 1   spironolactone  (ALDACTONE ) 25 MG tablet, Take 1 tablet (25 mg total) by mouth daily., Disp: 90 tablet, Rfl: 3   tiZANidine  (ZANAFLEX ) 4 MG tablet, One tab po at bedtime prn, Disp: 30 tablet, Rfl: 1   triamcinolone  cream (KENALOG ) 0.1 %, APPLY TO AFFECTED AREA TWICE DAILY AS NEEDED, Disp: 45 g, Rfl: 0   TURMERIC CURCUMIN PO, Take by mouth daily., Disp: , Rfl:    Allergies  Allergen Reactions   Diphenhydramine Other (See Comments)    Temporary Amnesia  diphenhydramine  diphenhydramine hydrochloride  Codeine Other (See Comments)    Hallucinations   Penicillins Hives     Review of Systems  Constitutional: Negative.   Respiratory: Negative.    Cardiovascular: Negative.   Gastrointestinal: Negative.   Musculoskeletal:  Positive for arthralgias.  Neurological: Negative.   Psychiatric/Behavioral: Negative.       Today's Vitals   02/21/24 1201  BP: 132/80  Pulse: 66  Temp: 98.1 F (36.7 C)  SpO2: 98%  Weight: 227 lb 9.6 oz (103.2 kg)  Height: 5' 5 (1.651 m)   Body mass index is 37.87 kg/m.  Wt Readings from Last 3 Encounters:  02/21/24 227 lb 9.6 oz (103.2 kg)  02/11/24 227 lb 9.6 oz (103.2 kg)  02/07/24 230 lb (104.3 kg)     Objective:  Physical Exam Vitals and nursing note reviewed.  Constitutional:      Appearance: Normal appearance.  HENT:     Head: Normocephalic and atraumatic.  Eyes:     Extraocular Movements: Extraocular movements intact.  Cardiovascular:     Rate and Rhythm: Normal rate and regular rhythm.     Heart sounds: Normal heart sounds.  Pulmonary:     Effort: Pulmonary effort is normal.     Breath sounds: Normal breath sounds.  Musculoskeletal:     Cervical back: Normal range of motion.  Skin:    General: Skin is warm.  Neurological:     General: No focal deficit present.     Mental Status: She is alert.  Psychiatric:        Mood and Affect: Mood normal.        Behavior: Behavior normal.          Assessment And Plan:  Prediabetes Assessment & Plan: Previous labs reviewed, her A1c has been elevated in the past. I will check an A1c today. Reminded to avoid refined sugars including sugary drinks/foods and processed meats including bacon, sausages and deli meats.  She is currently on Rybelsus  to address insulin  resistance.   Orders: -     Hemoglobin A1c  Essential hypertension, benign Assessment & Plan: Chronic, fair control. Goal BP<130/80.  She will continue with carvedilol  6.25mg  bid, olmesartan  40mg , and furosemide  20mg  daily. She is encouraged to follow a low sodium diet. She will f/u in four to six months for re-evaluation.     Polyarthralgia Assessment & Plan: Severe pain from generalized osteoarthritis, especially in feet and knees, worsened by weather. Previous cortisone injections provided temporary relief. She preferred meloxicam  over oral steroids due to side effects. - Prescribed meloxicam  twice daily with food for three days, then as needed up to three times a week. - She is already established with orthopedist for evaluation and possible cortisone injection or gel treatment. - Considered physical therapy, specifically aquatic therapy, for pain management.   Subclinical hyperthyroidism -     TSH + free T4  Tributary (branch) retinal vein occlusion, left eye, stable (HCC) Assessment & Plan: Chronic, she is closely followed by retinal specialist.  - no previous h/o diabetes   Right sided sciatica Assessment & Plan: Right-sided sciatica with radiating pain affecting daily activities. Previous exercises ineffective. - Referred to orthopedist for evaluation and possible imaging. - Prescribed muscle relaxer for symptomatic relief.   Cervical radiculopathy Assessment & Plan: Unfortunately, her sx have progressed over the past several weeks.  She does have radiation into both arms. No associated fever/chills. She is also followed by Rheumatology. She has also been  evaluated by Ortho in the past.   Orders: -  MR CERVICAL SPINE WO CONTRAST; Future  Bilateral carpal tunnel syndrome Assessment & Plan: Moderately severe bilateral carpal tunnel syndrome with worsening symptoms. Previous cortisone injections ineffective. She uses a wrist brace at night. - Continue use of wrist brace at night. - She is already followed by hand specialist   Insomnia, unspecified type Assessment & Plan: Non-restorative sleep and excessive fatigue. CPAP compliance suboptimal. Pain and sciatica impact sleep quality. She plans a short break to manage stress and fatigue. - Evaluate sleep quality after starting muscle relaxer. - Consider trazodone  for sleep if issues persist.   Paresthesia of lower extremity Assessment & Plan: Burning sensation under big toe and tingling in feet. Differential includes neuropathy versus mechanical issues due to bunion deformity. Podiatrist suspects mechanical issues. - Check B12 level. - Consider further evaluation if symptoms persist.   Class 2 severe obesity due to excess calories with serious comorbidity and body mass index (BMI) of 37.0 to 37.9 in adult Assessment & Plan: She is encouraged to strive for BMI less than 30 to decrease cardiac risk. Advised to aim for at least 150 minutes of exercise per week.    Drug therapy -     Vitamin B12  Other orders -     Meloxicam ; One tab po twice daily as needed  Dispense: 60 tablet; Refill: 2 -     tiZANidine  HCl; One tab po at bedtime prn  Dispense: 30 tablet; Refill: 1  General Health Maintenance Bone density test normal. Awaiting mammogram results. - Check on mammogram results. - Order thyroid  function test. - Arrange MRI for spine evaluation, ensuring it is an open MRI if possible.  Return for 4 month predm f/u.SABRA  Patient was given opportunity to ask questions. Patient verbalized understanding of the plan and was able to repeat key elements of the plan. All questions were  answered to their satisfaction.   I, Robin LOISE Slocumb, MD, have reviewed all documentation for this visit. The documentation on 02/21/24 for the exam, diagnosis, procedures, and orders are all accurate and complete.   IF YOU HAVE BEEN REFERRED TO A SPECIALIST, IT MAY TAKE 1-2 WEEKS TO SCHEDULE/PROCESS THE REFERRAL. IF YOU HAVE NOT HEARD FROM US /SPECIALIST IN TWO WEEKS, PLEASE GIVE US  A CALL AT [phone] X [phone].   THE PATIENT IS ENCOURAGED TO PRACTICE SOCIAL DISTANCING DUE TO THE COVID-19 PANDEMIC.

## 2024-02-22 LAB — HEMOGLOBIN A1C
Est. average glucose Bld gHb Est-mCnc: 117 mg/dL
Hgb A1c MFr Bld: 5.7 % — ABNORMAL HIGH (ref 4.8–5.6)

## 2024-02-22 LAB — TSH+FREE T4
Free T4: 1.53 ng/dL (ref 0.82–1.77)
TSH: 0.477 u[IU]/mL (ref 0.450–4.500)

## 2024-02-22 LAB — VITAMIN B12: Vitamin B-12: 1279 pg/mL — ABNORMAL HIGH (ref 232–1245)

## 2024-02-24 DIAGNOSIS — M5431 Sciatica, right side: Secondary | ICD-10-CM | POA: Insufficient documentation

## 2024-02-24 NOTE — Assessment & Plan Note (Signed)
 Right-sided sciatica with radiating pain affecting daily activities. Previous exercises ineffective. - Referred to orthopedist for evaluation and possible imaging. - Prescribed muscle relaxer for symptomatic relief.

## 2024-02-24 NOTE — Assessment & Plan Note (Signed)
 She is encouraged to strive for BMI less than 30 to decrease cardiac risk. Advised to aim for at least 150 minutes of exercise per week.

## 2024-02-24 NOTE — Assessment & Plan Note (Addendum)
 Severe pain from generalized osteoarthritis, especially in feet and knees, worsened by weather. Previous cortisone injections provided temporary relief. She preferred meloxicam over oral steroids due to side effects. - Prescribed meloxicam twice daily with food for three days, then as needed up to three times a week. - She is already established with orthopedist for evaluation and possible cortisone injection or gel treatment. - Considered physical therapy, specifically aquatic therapy, for pain management.

## 2024-02-24 NOTE — Assessment & Plan Note (Signed)
 Moderately severe bilateral carpal tunnel syndrome with worsening symptoms. Previous cortisone injections ineffective. She uses a wrist brace at night. - Continue use of wrist brace at night. - She is already followed by hand specialist

## 2024-02-25 ENCOUNTER — Ambulatory Visit: Payer: Self-pay | Admitting: Internal Medicine

## 2024-02-25 DIAGNOSIS — R202 Paresthesia of skin: Secondary | ICD-10-CM | POA: Insufficient documentation

## 2024-02-25 DIAGNOSIS — G4700 Insomnia, unspecified: Secondary | ICD-10-CM | POA: Insufficient documentation

## 2024-02-25 NOTE — Assessment & Plan Note (Signed)
 Burning sensation under big toe and tingling in feet. Differential includes neuropathy versus mechanical issues due to bunion deformity. Podiatrist suspects mechanical issues. - Check B12 level. - Consider further evaluation if symptoms persist.

## 2024-02-25 NOTE — Assessment & Plan Note (Signed)
 Chronic, fair control. Goal BP<130/80.  She will continue with carvedilol  6.25mg  bid, olmesartan  40mg , and furosemide  20mg  daily. She is encouraged to follow a low sodium diet. She will f/u in four to six months for re-evaluation.

## 2024-02-25 NOTE — Assessment & Plan Note (Signed)
 Non-restorative sleep and excessive fatigue. CPAP compliance suboptimal. Pain and sciatica impact sleep quality. She plans a short break to manage stress and fatigue. - Evaluate sleep quality after starting muscle relaxer. - Consider trazodone for sleep if issues persist.

## 2024-02-25 NOTE — Assessment & Plan Note (Signed)
 Chronic, she is closely followed by retinal specialist.  - no previous h/o diabetes

## 2024-02-26 DIAGNOSIS — M5412 Radiculopathy, cervical region: Secondary | ICD-10-CM | POA: Insufficient documentation

## 2024-02-26 DIAGNOSIS — M170 Bilateral primary osteoarthritis of knee: Secondary | ICD-10-CM | POA: Diagnosis not present

## 2024-02-26 NOTE — Assessment & Plan Note (Signed)
 Unfortunately, her sx have progressed over the past several weeks.  She does have radiation into both arms. No associated fever/chills. She is also followed by Rheumatology. She has also been evaluated by Ortho in the past.

## 2024-03-04 ENCOUNTER — Other Ambulatory Visit: Payer: Self-pay | Admitting: Vascular Surgery

## 2024-03-04 DIAGNOSIS — M7989 Other specified soft tissue disorders: Secondary | ICD-10-CM

## 2024-03-10 ENCOUNTER — Other Ambulatory Visit: Payer: Self-pay

## 2024-03-10 DIAGNOSIS — I10 Essential (primary) hypertension: Secondary | ICD-10-CM

## 2024-03-10 MED ORDER — OLMESARTAN MEDOXOMIL 20 MG PO TABS: 20.0000 mg | ORAL_TABLET | Freq: Every day | ORAL | 2 refills | Status: AC

## 2024-03-11 ENCOUNTER — Encounter: Payer: Self-pay | Admitting: Dermatology

## 2024-03-11 ENCOUNTER — Encounter (HOSPITAL_BASED_OUTPATIENT_CLINIC_OR_DEPARTMENT_OTHER): Payer: Self-pay

## 2024-03-11 ENCOUNTER — Ambulatory Visit (HOSPITAL_BASED_OUTPATIENT_CLINIC_OR_DEPARTMENT_OTHER)
Admission: RE | Admit: 2024-03-11 | Discharge: 2024-03-11 | Disposition: A | Discharge status: Home / Self Care | Source: Ambulatory Visit | Attending: Internal Medicine | Admitting: Internal Medicine

## 2024-03-11 ENCOUNTER — Ambulatory Visit: Admitting: Dermatology

## 2024-03-11 VITALS — BP 121/80

## 2024-03-11 DIAGNOSIS — Z1283 Encounter for screening for malignant neoplasm of skin: Secondary | ICD-10-CM | POA: Diagnosis not present

## 2024-03-11 DIAGNOSIS — L814 Other melanin hyperpigmentation: Secondary | ICD-10-CM | POA: Diagnosis not present

## 2024-03-11 DIAGNOSIS — D1801 Hemangioma of skin and subcutaneous tissue: Secondary | ICD-10-CM

## 2024-03-11 DIAGNOSIS — Z1231 Encounter for screening mammogram for malignant neoplasm of breast: Secondary | ICD-10-CM | POA: Diagnosis not present

## 2024-03-11 DIAGNOSIS — L821 Other seborrheic keratosis: Secondary | ICD-10-CM | POA: Diagnosis not present

## 2024-03-11 DIAGNOSIS — D229 Melanocytic nevi, unspecified: Secondary | ICD-10-CM | POA: Diagnosis not present

## 2024-03-11 NOTE — Patient Instructions (Signed)

## 2024-03-11 NOTE — Progress Notes (Signed)
   Total Body Skin Exam (TBSE) Visit   Subjective  Robin Odom is a 73 y.o. 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 She has returned today for TBSE.  Patient does not have spots of concern to be evaluated. She does apply sunscreen and/or wears protective coverings. Reports Hx of Bx. No family Hx of skin cancers.   The patient has spots, moles and lesions to be evaluated, some may be new or changing and the patient has concerns that these could be cancer.  The following portions of the chart were reviewed this encounter and updated as appropriate: medications, allergies, medical history  Review of Systems:  No other skin or systemic complaints except as noted in HPI or Assessment and Plan.  Objective  Well appearing patient in no apparent distress; mood and affect are within normal limits.  A full examination was performed including scalp, head, eyes, ears, nose, lips, neck, chest, axillae, abdomen, back, buttocks, bilateral upper extremities, bilateral lower extremities, hands, feet, fingers, toes, fingernails, and toenails. All findings within normal limits unless otherwise noted below.   Relevant physical exam findings are noted in the Assessment and Plan.    Assessment & Plan   LENTIGINES, SEBORRHEIC KERATOSES, HEMANGIOMAS - Benign normal skin lesions - Benign-appearing - Call for any changes  BENIGN MELANOCYTIC NEVI - Tan-brown and/or pink-flesh-colored symmetric macules and papules - Benign appearing on exam today - Observation - Call clinic for new or changing moles - Recommend daily use of broad spectrum spf 30+ sunscreen to sun-exposed areas.   SUN AND SKIN EDUCATION - Recommend daily broad spectrum sunscreen SPF 30+ to sun-exposed areas, reapply every 2 hours as needed.  - Staying in the shade or wearing long sleeves, sun glasses (UVA+UVB  protection) and wide brim hats (4-inch brim around the entire circumference of the hat) are also recommended for sun protection.  - Call for new or changing lesions.   SKIN CANCER SCREENING PERFORMED TODAY.   Return in about 2 years (around 03/11/2026) for TBSE.   Documentation: I have reviewed the above documentation for accuracy and completeness, and I agree with the above.  I, Shirron Maranda, CMA, am acting as scribe for [employer], DO.   Delon Lenis, DO

## 2024-03-15 ENCOUNTER — Other Ambulatory Visit: Payer: Self-pay | Admitting: Internal Medicine

## 2024-03-18 ENCOUNTER — Encounter: Payer: Self-pay | Admitting: Internal Medicine

## 2024-03-18 ENCOUNTER — Other Ambulatory Visit: Payer: Self-pay | Admitting: Internal Medicine

## 2024-03-18 MED ORDER — TRAZODONE HCL 50 MG PO TABS: 50.0000 mg | ORAL_TABLET | Freq: Every day | ORAL | 1 refills | Status: DC

## 2024-03-26 ENCOUNTER — Encounter: Payer: Self-pay | Admitting: Internal Medicine

## 2024-03-26 DIAGNOSIS — M48062 Spinal stenosis, lumbar region with neurogenic claudication: Secondary | ICD-10-CM | POA: Diagnosis not present

## 2024-04-09 DIAGNOSIS — G4733 Obstructive sleep apnea (adult) (pediatric): Secondary | ICD-10-CM | POA: Diagnosis not present

## 2024-04-10 ENCOUNTER — Other Ambulatory Visit: Payer: Self-pay | Admitting: Internal Medicine

## 2024-04-12 ENCOUNTER — Other Ambulatory Visit: Payer: Self-pay | Admitting: Internal Medicine

## 2024-04-14 NOTE — Progress Notes (Unsigned)
 Requested by:  Jarold Medici, MD [address] [place],  [place] [address]  Reason for consultation: Bilateral lower extremity edema   History of Present Illness   Robin Odom is a 73 y.o. (1950-11-06) female who presents for evaluation of bilateral lower extremity edema.  She endorses progressively worsening bilateral lower extremity swelling for the past 4 years, right greater than left.  She says when her swelling first started her legs would be normal in the mornings and then progressively get more swollen as the day goes on.  Usually her leg swelling would respond well to compression stockings and leg elevation.  She says over the past couple of years her swelling has gotten worse and sometimes does not even go away overnight.  She continues to try to wear compression stockings during the day, however she can only tolerate these for a couple of hours before her stockings get too tight.  She also continues to try to elevate her legs, however sometimes this does not seem to help with her swelling.  She says occasionally her swelling also progresses to her feet and she has a hard time fitting into her shoes.  Her leg swelling makes her legs feel very achy, heavy, and tired.  She denies any prior history of DVT or previous vein procedures.  She denies any venous ulcerations or bleeding events.  Past Medical History:  Diagnosis Date   Allergy 1971   penicillium   Arthritis    Carpal tunnel syndrome    left, per patient   Diabetes mellitus without complication (HCC)    Gout    Hyperlipidemia 05/19/2022   Hypertension    Partial retinal tear of both eyes without detachment 08/29/2017   Sleep apnea 1995    Past Surgical History:  Procedure Laterality Date   ABDOMINAL HYSTERECTOMY  1991   CARPAL TUNNEL RELEASE Right 2019   COLONOSCOPY     5 between 1994-2010   Left knee surgery  2006   ROTATOR CUFF REPAIR Left 2001   ROTATOR CUFF REPAIR Right 2003   SHOULDER  SURGERY  2001/2003   TONSILECTOMY/ADENOIDECTOMY WITH MYRINGOTOMY  1971    Social History   Socioeconomic History   Marital status: Single    Spouse name: Not on file   Number of children: Not on file   Years of education: Not on file   Highest education level: Doctorate  Occupational History   Occupation: retired  Tobacco Use   Smoking status: Never    Passive exposure: Never   Smokeless tobacco: Never  Vaping Use   Vaping status: Never Used  Substance and Sexual Activity   Alcohol use: Yes    Comment: rarely   Drug use: No   Sexual activity: Not Currently    Birth control/protection: None    Comment: Hysterectomy  Other Topics Concern   Not on file  Social History Narrative   Not on file   Social Drivers of Health   Tobacco Use: Low Risk (03/11/2024)   Patient History    Smoking Tobacco Use: Never    Smokeless Tobacco Use: Never    Passive Exposure: Never  Financial Resource Strain: Low Risk (02/20/2024)   Overall Financial Resource Strain (CARDIA)    Difficulty of Paying Living Expenses: Not hard at all  Food Insecurity: No Food Insecurity (02/20/2024)   Epic    Worried About Radiation Protection Practitioner of Food in the Last Year: Never true    [employer] of Food in  the Last Year: Never true  Transportation Needs: No Transportation Needs (02/20/2024)   Epic    Lack of Transportation (Medical): No    Lack of Transportation (Non-Medical): No  Physical Activity: Insufficiently Active (02/20/2024)   Exercise Vital Sign    Days of Exercise per Week: 3 days    Minutes of Exercise per Session: 30 min  Stress: No Stress Concern Present (02/20/2024)   [employer] of Occupational Health - Occupational Stress Questionnaire    Feeling of Stress: Only a little  Social Connections: Moderately Integrated (02/20/2024)   Social Connection and Isolation Panel    Frequency of Communication with Friends and Family: More than three times a week    Frequency of Social Gatherings with Friends  and Family: Three times a week    Attends Religious Services: More than 4 times per year    Active Member of Clubs or Organizations: Yes    Attends Banker Meetings: More than 4 times per year    Marital Status: Never married  Intimate Partner Violence: Not At Risk (06/22/2023)   Humiliation, Afraid, Rape, and Kick questionnaire    Fear of Current or Ex-Partner: No    Emotionally Abused: No    Physically Abused: No    Sexually Abused: No  Depression (PHQ2-9): Low Risk (02/21/2024)   Depression (PHQ2-9)    PHQ-2 Score: 0  Alcohol Screen: Low Risk (02/20/2024)   Alcohol Screen    Last Alcohol Screening Score (AUDIT): 1  Housing: Low Risk (02/20/2024)   Epic    Unable to Pay for Housing in the Last Year: No    Number of Times Moved in the Last Year: 0    Homeless in the Last Year: No  Utilities: Not At Risk (06/22/2023)   AHC Utilities    Threatened with loss of utilities: No  Health Literacy: Adequate Health Literacy (06/22/2023)   B1300 Health Literacy    Frequency of need for help with medical instructions: Never    Family History  Problem Relation Age of Onset   Heart disease Mother    Hypertension Mother    Diabetes Mother    Arthritis Father    Hypertension Father    Prostate cancer Father    Parkinson's disease Father    Heart disease Father    Cancer Father    Sinusitis Sister    Allergic rhinitis Sister    Diabetes Sister    High Cholesterol Sister    Hypertension Sister    Hyperlipidemia Sister    Diabetes Brother    Hypertension Brother    Anxiety disorder Brother    Asthma Brother    High Cholesterol Brother    Hypertension Brother    Cancer Maternal Aunt    Hypertension Maternal Aunt    Kidney disease Maternal Aunt    Cancer Maternal Uncle    Arthritis Paternal Aunt    Hypertension Paternal Aunt    Cancer Paternal Uncle    Intellectual disability Paternal Uncle    Heart disease Maternal Grandmother    Heart disease Paternal Grandmother     Heart disease Paternal Grandfather    Breast cancer Cousin 91   Angioedema Neg Hx    Eczema Neg Hx    Immunodeficiency Neg Hx    Urticaria Neg Hx    BRCA 1/2 Neg Hx     Current Outpatient Medications  Medication Sig Dispense Refill   allopurinol  (ZYLOPRIM ) 300 MG tablet TAKE 1 TABLET BY MOUTH EVERY DAY 90 tablet  0   Ascorbic Acid (VITAMIN C PO) Take 1,500 mg by mouth daily at 6 (six) AM.     atorvastatin  (LIPITOR) 20 MG tablet TAKE 1 TABLET BY MOUTH EVERY DAY 90 tablet 3   B Complex Vitamins (VITAMIN-B COMPLEX) TABS Take by mouth.     BENFOTIAMINE PO Take by mouth 2 (two) times daily.     Calcium  Carb-Cholecalciferol 500-10 MG-MCG TABS Take 1 tablet by mouth daily.     carvedilol  (COREG ) 6.25 MG tablet TAKE 1 TABLET BY MOUTH TWICE A DAY 180 tablet 3   Coenzyme Q10 (COQ10) 200 MG CAPS Take 1 capsule by mouth daily at 6 (six) AM.     colchicine  0.6 MG tablet Take 0.6 mg by mouth as needed (For gout flare up).     furosemide  (LASIX ) 20 MG tablet TAKE 1 TABLET BY MOUTH EVERY DAY 90 tablet 1   Magnesium Bisglycinate (MAG GLYCINATE PO) Take by mouth daily.     meloxicam  (MOBIC ) 7.5 MG tablet One tab po twice daily as needed 60 tablet 2   NON FORMULARY as directed. SUPERBEETS     olmesartan  (BENICAR ) 20 MG tablet Take 1 tablet (20 mg total) by mouth daily. 90 tablet 2   Omega-3 Fatty Acids (OMEGA 3 PO) Take 2,500 mg by mouth daily. 2 tablets by mouth daily     potassium chloride  (MICRO-K ) 10 MEQ CR capsule TAKE 1 CAPSULE BY MOUTH EVERY DAY 90 capsule 1   RYBELSUS  3 MG TABS TAKE 1 TABLET BY MOUTH EVERY MORNING. 90 tablet 1   spironolactone  (ALDACTONE ) 25 MG tablet Take 1 tablet (25 mg total) by mouth daily. 90 tablet 3   tiZANidine  (ZANAFLEX ) 4 MG tablet TAKE 1 TABLET BY MOUTH EVERY DAY AT BEDTIME AS NEEDED 90 tablet 0   traZODone  (DESYREL ) 50 MG tablet TAKE 1 TABLET BY MOUTH EVERYDAY AT BEDTIME 90 tablet 1   triamcinolone  cream (KENALOG ) 0.1 % APPLY TO AFFECTED AREA TWICE DAILY AS NEEDED  45 g 0   TURMERIC CURCUMIN PO Take by mouth daily.     No current facility-administered medications for this visit.    Allergies[1]  REVIEW OF SYSTEMS (negative unless checked):   Cardiac:  []  Chest pain or chest pressure? []  Shortness of breath upon activity? []  Shortness of breath when lying flat? []  Irregular heart rhythm?  Vascular:  []  Pain in calf, thigh, or hip brought on by walking? []  Pain in feet at night that wakes you up from your sleep? []  Blood clot in your veins? [x]  Leg swelling?  Pulmonary:  []  Oxygen at home? []  Productive cough? []  Wheezing?  Neurologic:  []  Sudden weakness in arms or legs? []  Sudden numbness in arms or legs? []  Sudden onset of difficult speaking or slurred speech? []  Temporary loss of vision in one eye? []  Problems with dizziness?  Gastrointestinal:  []  Blood in stool? []  Vomited blood?  Genitourinary:  []  Burning when urinating? []  Blood in urine?  Psychiatric:  []  Major depression  Hematologic:  []  Bleeding problems? []  Problems with blood clotting?  Dermatologic:  []  Rashes or ulcers?  Constitutional:  []  Fever or chills?  Ear/Nose/Throat:  []  Change in hearing? []  Nose bleeds? []  Sore throat?  Musculoskeletal:  []  Back pain? []  Joint pain? []  Muscle pain?   Physical Examination     Vitals:   04/15/24 1119  BP: 130/84  Pulse: 74  Temp: 98.1 F (36.7 C)  SpO2: 95%  Weight: 230 lb 12.8 oz (104.7 kg)  Height: 5' 5 (1.651 m)   There is no height or weight on file to calculate BMI.  General:  WDWN in NAD; vital signs documented above Gait: Not observed HENT: WNL, normocephalic Pulmonary: normal non-labored breathing  Cardiac: regular Abdomen: soft, NT, no masses Skin: without rashes Vascular Exam/Pulses: Brisk DP/PT Doppler signals bilaterally Extremities: No varicose veins or venous ulcerations.  Mild brawny discoloration of bilateral lower legs.  2+ pitting edema of right lower leg and  ankle.  1+ pitting edema of left lower leg and ankle.  Mild, nonpitting edema of bilateral feet Musculoskeletal: no muscle wasting or atrophy  Neurologic: A&O X 3;  No focal weakness or paresthesias are detected Psychiatric:  The pt has Normal affect.  Non-invasive Vascular Imaging   RLE Venous Insufficiency Duplex (04/15/2024):  No evidence of venous insufficiency in the right lower extremity.  No evidence of DVT or SVT.  Medical Decision Making   ASPEN LAWRANCE is a 73 y.o. female who presents for evaluation of bilateral lower extremity edema  Based on the patient's duplex, there is no evidence of venous insufficiency in the right lower extremity.  There is also no evidence of DVT or SVT.  The patient would not be a candidate for saphenous vein ablation, given that it is competent throughout the entire leg The patient reports progressively worsening bilateral lower extremity swelling for the past 4 years, right greater than left.  Her swelling used to respond well to compression stockings and leg elevation.  She notes that her swelling usually gets worse later on in the day, however and sometimes does not respond to compression or leg elevation anymore.  She says that she wakes up some mornings and her legs are still swollen.  She also notes occasional swelling of her feet, making it difficult for her to fit into her shoes. Her leg swelling causes her generalized discomfort and aching.  She denies any prior history of DVT.  She tries to wear compression stockings daily, however she can only tolerate knee-high's for a few hours before they become too tight.  She only elevates her legs in the evening before bed. On exam she has mild brawny discoloration of bilateral lower legs and ankles.  She has pitting edema of bilateral lower legs, right greater than left.  She also has mild, nonpitting edema of bilateral feet I have explained to the patient that she does not have any evidence of venous  insufficiency.  Her symptoms sound consistent with lymphedema.  She is aware that there are no procedures to treat this condition.  Her symptoms would be best controlled with continued compression therapy daily.  I have also recommended routine, daily leg elevation above the heart.  If her swelling continues to be poorly responsive to conservative therapy, her PCP could potentially prescribe lymphedema pumps. She can follow-up with our office as needed   Ahmed SHAUNNA Holster, PA-C [facility] Office: [phone]  Clinic MD: Gretta      [1]  Allergies Allergen Reactions   Diphenhydramine Other (See Comments)    Temporary Amnesia  diphenhydramine  diphenhydramine hydrochloride   Codeine Other (See Comments)    Hallucinations   Penicillins Hives

## 2024-04-15 ENCOUNTER — Ambulatory Visit (HOSPITAL_COMMUNITY): Admission: RE | Admit: 2024-04-15 | Discharge: 2024-04-15 | Attending: Vascular Surgery

## 2024-04-15 ENCOUNTER — Ambulatory Visit

## 2024-04-15 VITALS — BP 130/84 | HR 74 | Temp 98.1°F | Ht 65.0 in | Wt 230.8 lb

## 2024-04-15 DIAGNOSIS — M7989 Other specified soft tissue disorders: Secondary | ICD-10-CM

## 2024-04-15 DIAGNOSIS — I890 Lymphedema, not elsewhere classified: Secondary | ICD-10-CM | POA: Diagnosis not present

## 2024-04-15 DIAGNOSIS — R600 Localized edema: Secondary | ICD-10-CM | POA: Diagnosis not present

## 2024-04-17 ENCOUNTER — Encounter: Payer: Self-pay | Admitting: Internal Medicine

## 2024-04-17 DIAGNOSIS — M19072 Primary osteoarthritis, left ankle and foot: Secondary | ICD-10-CM | POA: Diagnosis not present

## 2024-04-17 DIAGNOSIS — M21611 Bunion of right foot: Secondary | ICD-10-CM | POA: Diagnosis not present

## 2024-04-17 DIAGNOSIS — M76821 Posterior tibial tendinitis, right leg: Secondary | ICD-10-CM | POA: Diagnosis not present

## 2024-04-17 DIAGNOSIS — M21612 Bunion of left foot: Secondary | ICD-10-CM | POA: Diagnosis not present

## 2024-04-17 DIAGNOSIS — M76822 Posterior tibial tendinitis, left leg: Secondary | ICD-10-CM | POA: Diagnosis not present

## 2024-04-18 DIAGNOSIS — J018 Other acute sinusitis: Secondary | ICD-10-CM | POA: Diagnosis not present

## 2024-04-18 DIAGNOSIS — Z6837 Body mass index (BMI) 37.0-37.9, adult: Secondary | ICD-10-CM | POA: Diagnosis not present

## 2024-04-22 ENCOUNTER — Ambulatory Visit: Admitting: Internal Medicine

## 2024-04-26 ENCOUNTER — Other Ambulatory Visit: Payer: Self-pay | Admitting: Internal Medicine

## 2024-04-30 ENCOUNTER — Other Ambulatory Visit: Payer: Self-pay | Admitting: Physician Assistant

## 2024-04-30 NOTE — Telephone Encounter (Signed)
 Last Fill: 01/22/2024  Labs: 12/03/2023 CBC and CMP WNL Uric acid WNL  Next Visit: 06/03/2024  Last Visit: 02/11/2024  DX: 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 tophus   Current Dose per office note 02/11/2024: allopurinol  300 mg 1 tablet by mouth daily.   Okay to refill Allopurinol ?

## 2024-05-13 ENCOUNTER — Ambulatory Visit (HOSPITAL_BASED_OUTPATIENT_CLINIC_OR_DEPARTMENT_OTHER): Admitting: Cardiology

## 2024-05-13 ENCOUNTER — Encounter (HOSPITAL_BASED_OUTPATIENT_CLINIC_OR_DEPARTMENT_OTHER): Payer: Self-pay | Admitting: Cardiology

## 2024-05-14 ENCOUNTER — Ambulatory Visit (HOSPITAL_BASED_OUTPATIENT_CLINIC_OR_DEPARTMENT_OTHER): Admitting: Cardiology

## 2024-05-14 ENCOUNTER — Encounter (HOSPITAL_BASED_OUTPATIENT_CLINIC_OR_DEPARTMENT_OTHER): Payer: Self-pay | Admitting: Cardiology

## 2024-05-14 VITALS — BP 130/70 | HR 69 | Ht 65.5 in | Wt 229.8 lb

## 2024-05-14 DIAGNOSIS — I5189 Other ill-defined heart diseases: Secondary | ICD-10-CM

## 2024-05-14 DIAGNOSIS — E66812 Obesity, class 2: Secondary | ICD-10-CM | POA: Diagnosis not present

## 2024-05-14 DIAGNOSIS — I10 Essential (primary) hypertension: Secondary | ICD-10-CM

## 2024-05-14 DIAGNOSIS — M7989 Other specified soft tissue disorders: Secondary | ICD-10-CM

## 2024-05-14 DIAGNOSIS — Z6837 Body mass index (BMI) 37.0-37.9, adult: Secondary | ICD-10-CM

## 2024-05-14 DIAGNOSIS — Z9189 Other specified personal risk factors, not elsewhere classified: Secondary | ICD-10-CM | POA: Diagnosis not present

## 2024-05-14 MED ORDER — FUROSEMIDE 20 MG PO TABS: 20.0000 mg | ORAL_TABLET | Freq: Every day | ORAL | Status: AC | PRN

## 2024-05-14 NOTE — Patient Instructions (Signed)
 Medication Instructions:   Your physician recommends that you continue on your current medications as directed. Please refer to the Current Medication list given to you today.   *If you need a refill on your cardiac medications before your next appointment, please call your pharmacy*  Lab Work:  None ordered.  If you have labs (blood work) drawn today and your tests are completely normal, you will receive your results only by: MyChart Message (if you have MyChart) OR A paper copy in the mail If you have any lab test that is abnormal or we need to change your treatment, we will call you to review the results.  Testing/Procedures:  None ordered.  Follow-Up: At [facility], you and your health needs are our priority.  As part of our continuing mission to provide you with exceptional heart care, our providers are all part of one team.  This team includes your primary Cardiologist (physician) and Advanced Practice Providers or APPs (Physician Assistants and Nurse Practitioners) who all work together to provide you with the care you need, when you need it.  Your next appointment:   6 month(s)  Provider:   Sheryle Donning, MD, Slater Duncan, NP, or Neomi Banks, NP    We recommend signing up for the patient portal called "MyChart".  Sign up information is provided on this After Visit Summary.  MyChart is used to connect with patients for Virtual Visits (Telemedicine).  Patients are able to view lab/test results, encounter notes, upcoming appointments, etc.  Non-urgent messages can be sent to your provider as well.   To learn more about what you can do with MyChart, go to [email or web address].   Other Instructions  Your physician wants you to follow-up in: 6 months.  You will receive a reminder letter in the mail two months in advance. If you don't receive a letter, please call our office to schedule the follow-up appointment.

## 2024-05-14 NOTE — Progress Notes (Signed)
 " Cardiology Office Note:  .    Date:  05/14/2024  ID:  Robin Odom, DOB 10-Oct-1950, MRN [id number] PCP: Jarold Medici, MD  [facility] Providers Cardiologist:  Shelda Bruckner, MD     History of Present Illness: .    Robin Odom is a 74 y.o. female with a hx of hypertension, asthma, LE edema who is seen for follow up today. I initially met her 08/02/20 as a new consult at the request of Jarold Medici, MD for the evaluation and management of dyspnea on exertion.   CV risk: hypertension, hyperlipidemia, family history of heart disease. OSA on CPAP.  CV history: Treadmill stress with hypertensive response to exercise; echo also with diastolic dysfunction and mildly elevated filling pressure based on tissue doppler. LVEF normal, no significant valve disease.   Today: Sent in a mychart message yesterday. Had an episode mid December with arm tingling, took a baby aspirin. Had dizziness spell, vitals below: Jan 11 -7am  127/80 pulse 72               - 5PM 124/78 pulse 75                - 11PM 92/62 pulse 83               - 12 pm 112/69 73 Jan 12 - 9am 118/69 pulse 69 before BP meds               - 118/86 pulse 77  Had a little lightheadedness last night before she went to bed. She notes that the blood pressures noted in the message were typos and she has not seen any elevated numbers. These were transposed, should have been 127 (not 172) in her note.  She feels like she has not been drinking as much water as she usually does recently. Only takes lasix  very rarely. Lasix  has not changed her ankle edema at all. Wears compression socks daily. Notes worsening at the end of the day.  No chest pain or arm pain. Arm tingling starts in her posterior neck and goes down, also has neuropathy in other areas, reviewed signs to watch for.  Has not done much aerobic activity in the last 6 mos due to foot issues. Can walk but feels short of breath going up hill. Planning to start aquatherapy  soon for her back.  ROS:  Denies chest pain, shortness of breath at rest or with normal exertion. No PND, orthopnea, or unexpected weight gain. No syncope or palpitations. ROS otherwise negative except as noted.   Studies Reviewed: SABRA         Physical Exam:    VS:  BP 130/70 (BP Location: Right Arm, Patient Position: Sitting, Cuff Size: Large)   Pulse 69   Ht 5' 5.5 (1.664 m)   Wt 229 lb 12.8 oz (104.2 kg)   SpO2 100%   BMI 37.66 kg/m    Wt Readings from Last 3 Encounters:  05/14/24 229 lb 12.8 oz (104.2 kg)  04/15/24 230 lb 12.8 oz (104.7 kg)  02/21/24 227 lb 9.6 oz (103.2 kg)    GEN: Well nourished, well developed in no acute distress HEENT: Normal, moist mucous membranes NECK: No JVD CARDIAC: regular rhythm, normal S1 and S2, no rubs or gallops. 1/6 systolic murmur. VASCULAR: Radial and DP pulses 2+ bilaterally. No carotid bruits RESPIRATORY:  Clear to auscultation without rales, wheezing or rhonchi  ABDOMEN: Soft, non-tender, non-distended MUSCULOSKELETAL:  Ambulates independently SKIN: Warm  and dry. Mild bilateral nonpitting edema. Prominent ankle bursa bilaterally. NEUROLOGIC:  Alert and oriented x 3. No focal neuro deficits noted. PSYCHIATRIC:  Normal affect    ASSESSMENT AND PLAN: .    Hypertension, with intermittent hypotension Grade 1 diastolic dysfunction on echo 2022, without clinical heart failure -goal <130/80 but systolic >100 -amlodipine  stopped due to hypotension -using furosemide  only PRN, agree with this as it did not change swelling -continue carvedilol  6.25 mg BID -continue olmesartan  20 mg daily -continue spironolactone  given diastolic dysfunction -cautioned to avoid dehydration -she will continue to monitor blood pressures   Intermittent LE edema -If symptoms worsen would repeat echocardiogram -no significant reflux seen on venous study. Seen by vein & vascular, not felt to be venous insufficiency, could be mild lymphedema. -tries to wear  compression stockings -also has prominent ankle bursa bilaterally, discussed that this isn't edema, how to tell the difference  Hyperlipidemia: Elevated ASCVD risk score -we discussed her elevated ASCVD risk and recommendations for statins. She declines.   Class 2 obesity Pre-diabetes -working on weight loss, BMI 37 today -on rybelsus   CV risk counseling and prevention -recommend heart healthy/Mediterranean diet, with whole grains, fruits, vegetable, fish, lean meats, nuts, and olive oil. Limit salt. -recommend moderate walking, 3-5 times/week for 30-50 minutes each session. Aim for at least 150 minutes.week. Goal should be pace of 3 miles/hours, or walking 1.5 miles in 30 minutes -recommend avoidance of tobacco products. Avoid excess alcohol.  Dispo: Follow-up in 6 months or sooner as needed  Signed, Shelda Bruckner, MD   "

## 2024-05-16 ENCOUNTER — Other Ambulatory Visit (HOSPITAL_BASED_OUTPATIENT_CLINIC_OR_DEPARTMENT_OTHER): Payer: Self-pay | Admitting: Cardiology

## 2024-05-20 NOTE — Progress Notes (Signed)
 "  Office Visit Note  Patient: Robin Odom             Date of Birth: Oct 16, 1950           MRN: [id number]             PCP: Jarold Medici, MD Referring: Jarold Medici, MD Visit Date: 06/03/2024 Occupation: Data Unavailable  Subjective:  Medication management  History of Present Illness: Robin Odom is a 74 y.o. female with gout and osteoarthritis.  She returns today after her last visit in October 2025.  She denies having a gout flare.  She has been on allopurinol  300 mg daily without any interruption.  She continues to have intermittent discomfort in her shoulders, hands and her knees.  She states she has been getting cortisone injections as needed at [facility].  She denies any discomfort in her feet today.  She has intermittent discomfort in her neck and lower back which is currently not symptomatic.  She has been taking calcium  and vitamin D  for osteopenia.    Activities of Daily Living:  Patient reports morning stiffness for not always.   Patient Reports nocturnal pain.  Difficulty dressing/grooming: Denies Difficulty climbing stairs: Reports Difficulty getting out of chair: Denies Difficulty using hands for taps, buttons, cutlery, and/or writing: Reports  Review of Systems  Constitutional:  Negative for fatigue.  HENT:  Negative for mouth sores and mouth dryness.   Eyes:  Negative for dryness.  Respiratory:  Negative for shortness of breath.   Cardiovascular:  Negative for chest pain and palpitations.  Gastrointestinal:  Negative for blood in stool, constipation and diarrhea.  Endocrine: Negative for increased urination.  Genitourinary:  Negative for involuntary urination.  Musculoskeletal:  Positive for joint pain, joint pain and joint swelling. Negative for gait problem, myalgias, muscle weakness, morning stiffness, muscle tenderness and myalgias.  Skin:  Negative for color change, rash, hair loss and sensitivity to sunlight.  Allergic/Immunologic: Negative for  susceptible to infections.  Neurological:  Negative for dizziness and headaches.  Hematological:  Negative for swollen glands.  Psychiatric/Behavioral:  Negative for depressed mood and sleep disturbance. The patient is not nervous/anxious.     PMFS History:  Patient Active Problem List   Diagnosis Date Noted   Cervical radiculopathy 02/26/2024   Insomnia 02/25/2024   Paresthesia of lower extremity 02/25/2024   Right sided sciatica 02/24/2024   Tributary (branch) retinal vein occlusion, left eye, stable (HCC) 02/21/2024   Peripheral edema 10/27/2023   Polyarthralgia 10/27/2023   Menopausal symptom 10/16/2023   Uterine leiomyoma 10/16/2023   Class 2 severe obesity due to excess calories with serious comorbidity and body mass index (BMI) of 38.0 to 38.9 in adult 06/21/2023   Arthritis of ankle or foot, degenerative, left 06/13/2023   Bilateral chronic knee pain 02/28/2023   Encounter for general adult medical examination w/o abnormal findings 02/20/2023   Subclinical hyperthyroidism 02/20/2023   Tibialis posterior tendonitis, right 01/26/2023   Tibialis posterior tendon tear, nontraumatic, right 12/27/2022   Pure hypercholesterolemia 10/26/2022   Skin lesion of right leg 10/26/2022   OSA on CPAP 10/25/2022   Class 2 severe obesity due to excess calories with serious comorbidity and body mass index (BMI) of 37.0 to 37.9 in adult 08/03/2022   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 08/03/2022   Ganglion cyst of both hands 03/13/2022   Toe injury, left, initial encounter 06/27/2021   Bilateral bunions 11/02/2020   History of wheezing 03/18/2020   Osteopenia of multiple sites 10/30/2019  Bunion, right foot 08/13/2018   Prediabetes 05/14/2018   Essential hypertension, benign 05/14/2018   Morbid obesity due to excess calories (HCC) 05/14/2018   Contracture of right Achilles tendon 02/08/2017   Plantar fasciitis of right foot 02/08/2017   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  tophus 07/20/2016   Primary osteoarthritis of both hands 07/20/2016   Primary osteoarthritis of both knees 07/20/2016   Primary osteoarthritis of both feet 07/20/2016   DJD (degenerative joint disease), cervical 07/20/2016   Elevated CK 07/20/2016   History of humerus fracture 07/20/2016   History of hypertension 07/20/2016   History of diabetes mellitus 07/20/2016   History of hyperlipidemia 07/20/2016   Gastroesophageal reflux 12/08/2015   Chronic rhinitis 10/20/2015   Mild intermittent asthma 10/20/2015   Metatarsalgia of both feet 08/31/2015   Pes planus of both feet 08/31/2015   Abnormal glucose 10/05/2012   Arthropathy 10/05/2012   Gout 10/05/2012   Carpal tunnel syndrome 10/12/2009    Past Medical History:  Diagnosis Date   Allergy 1971   penicillium   Arthritis    Carpal tunnel syndrome    left, per patient   Diabetes mellitus without complication (HCC)    Gout    Hyperlipidemia 05/19/2022   Hypertension    Partial retinal tear of both eyes without detachment 08/29/2017   Sleep apnea 1995    Family History  Problem Relation Age of Onset   Heart disease Mother    Hypertension Mother    Diabetes Mother    Arthritis Father    Hypertension Father    Prostate cancer Father    Parkinson's disease Father    Heart disease Father    Cancer Father    Sinusitis Sister    Allergic rhinitis Sister    Diabetes Sister    High Cholesterol Sister    Hypertension Sister    Hyperlipidemia Sister    Diabetes Brother    Hypertension Brother    Anxiety disorder Brother    Asthma Brother    High Cholesterol Brother    Hypertension Brother    Cancer Maternal Aunt    Hypertension Maternal Aunt    Kidney disease Maternal Aunt    Cancer Maternal Uncle    Arthritis Paternal Aunt    Hypertension Paternal Aunt    Cancer Paternal Uncle    Intellectual disability Paternal Uncle    Heart disease Maternal Grandmother    Heart disease Paternal Grandmother    Heart disease  Paternal Grandfather    Breast cancer Cousin 65   Angioedema Neg Hx    Eczema Neg Hx    Immunodeficiency Neg Hx    Urticaria Neg Hx    BRCA 1/2 Neg Hx    Past Surgical History:  Procedure Laterality Date   ABDOMINAL HYSTERECTOMY  1991   CARPAL TUNNEL RELEASE Right 2019   COLONOSCOPY     5 between 1994-2010   Left knee surgery  2006   ROTATOR CUFF REPAIR Left 2001   ROTATOR CUFF REPAIR Right 2003   SHOULDER SURGERY  2001/2003   TONSILECTOMY/ADENOIDECTOMY WITH MYRINGOTOMY  1971   Social History[1] Social History   Social History Narrative   Not on file     Immunization History  Administered Date(s) Administered    sv, Bivalent, Protein Subunit Rsvpref,pf [place]) 05/23/2022   INFLUENZA, HIGH DOSE SEASONAL PF 02/05/2017, 02/12/2018, 12/16/2018, 01/13/2020, 01/18/2021, 01/02/2023, 02/08/2024   Influenza,inj,quad, With Preservative 02/13/2017, 02/01/2018   Influenza-Unspecified 02/03/2015, 01/18/2018, 01/29/2018, 12/16/2018, 01/18/2022, 01/02/2023   PFIZER(Purple  Top)SARS-COV-2 Vaccination 01/01/2019, 01/23/2019, 08/19/2019, 08/13/2020   Pfizer Covid-19 Vaccine Bivalent Booster 74yrs & up 04/03/2021, 01/18/2022   Pneumococcal Conjugate-13 01/20/2014   Pneumococcal Polysaccharide-23 10/09/2016   Respiratory Syncytial Virus Vaccine,Recomb Aduvanted(Arexvy) 05/23/2022   Tdap 12/13/2020   Unspecified SARS-COV-2 Vaccination 08/20/2019   Zoster Recombinant(Shingrix) 10/18/2019, 03/05/2020     Objective: Vital Signs: BP (!) 140/85   Pulse 79   Temp 97.6 F (36.4 C)   Resp 14   Ht 5' 5.5 (1.664 m)   Wt 231 lb 12.8 oz (105.1 kg)   BMI 37.99 kg/m    Physical Exam Vitals and nursing note reviewed.  Constitutional:      Appearance: She is well-developed.  HENT:     Head: Normocephalic and atraumatic.  Eyes:     Conjunctiva/sclera: Conjunctivae normal.  Cardiovascular:     Rate and Rhythm: Normal rate and regular rhythm.     Heart sounds: Normal heart sounds.   Pulmonary:     Effort: Pulmonary effort is normal.     Breath sounds: Normal breath sounds.  Abdominal:     General: Bowel sounds are normal.     Palpations: Abdomen is soft.  Musculoskeletal:     Cervical back: Normal range of motion.  Skin:    General: Skin is warm and dry.     Capillary Refill: Capillary refill takes less than 2 seconds.  Neurological:     Mental Status: She is alert and oriented to person, place, and time.  Psychiatric:        Behavior: Behavior normal.      Musculoskeletal Exam: Cervical, thoracic and lumbar spine were in good range of motion.  There was no SI joint tenderness.  Shoulder joints, elbow joints, wrist joints, MCPs, PIPs and DIPs were in good range of motion with no synovitis.  Bilateral DIP thickening was noted.  Hip joints and knee joints were in good range of motion without any warmth swelling or effusion.  There was no tenderness over ankles or MTPs.   CDAI Exam: CDAI Score: -- Patient Global: --; Provider Global: -- Swollen: --; Tender: -- Joint Exam 06/03/2024   No joint exam has been documented for this visit   There is currently no information documented on the homunculus. Go to the Rheumatology activity and complete the homunculus joint exam.  Investigation: No additional findings.  Imaging: No results found.  Recent Labs: Lab Results  Component Value Date   WBC 8.1 12/03/2023   HGB 12.8 12/03/2023   PLT 296 12/03/2023   NA 142 12/03/2023   K 4.6 12/03/2023   CL 107 12/03/2023   CO2 31 12/03/2023   GLUCOSE 76 12/03/2023   BUN 20 12/03/2023   CREATININE 0.99 12/03/2023   BILITOT 0.4 12/03/2023   ALKPHOS 87 06/21/2023   AST 19 12/03/2023   ALT 13 12/03/2023   PROT 6.6 12/03/2023   ALBUMIN 4.1 06/21/2023   CALCIUM  9.7 12/03/2023   GFRAA 77 05/13/2020    Speciality Comments: No specialty comments available.  Procedures:  No procedures performed Allergies: Diphenhydramine, Codeine, and Penicillins   Assessment  / Plan:     Visit Diagnoses: 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 tophus -patient denies having a gout flare.  She has been on allopurinol  300 mg daily which she has been taking on a regular basis.  Uric acid was 3.3 on December 03, 2023.  Patient states she will get CBC, CMP and uric acid with Dr. Jarold next week.  Medication monitoring encounter-CBC  and CMP were normal on December 03, 2023.  Primary osteoarthritis of both hands-she has bilateral DIP thickening.  She continues to have some discomfort in her hands.  No synovitis was noted.  Primary osteoarthritis of both knees -patient has been followed at [facility].  She states has been getting cortisone injections as needed.  She continues to have some discomfort in her knee joints and difficulty with mobility.  Primary osteoarthritis of both feet -currently not symptomatic.  She denies any flares.  She had bilateral pes planus and hallux valgus deformity.  Chronic pain of both shoulders-she has intermittent discomfort in her shoulders.  She good range of motion without any discomfort today.  DDD (degenerative disc disease), cervical-she good range of motion without any discomfort or radiculopathy.  Degeneration of intervertebral disc of lumbar region without discogenic back pain or lower extremity pain-she has history of intermittent pain in her lower back.  Osteopenia of multiple sites - December 06, 2023 DEXA scan T-score -1.0 right femoral neck.  No comparison given.  History of hypertension-pressure was 140/85.  She was advised to monitor blood pressure closely and follow-up with her PCP.  History of diabetes mellitus  History of hyperlipidemia  History of humerus fracture  Varicose veins of both lower extremities with pain    Orders: No orders of the defined types were placed in this encounter.  No orders of the defined types were placed in this encounter.    Follow-Up Instructions: Return in about 6 months  (around 12/01/2024) for Osteoarthritis, Gout.   Maya Nash, MD  Note - This record has been created using Animal nutritionist.  Chart creation errors have been sought, but may not always  have been located. Such creation errors do not reflect on  the standard of medical care.     [1]  Social History Tobacco Use   Smoking status: Never    Passive exposure: Never   Smokeless tobacco: Never  Vaping Use   Vaping status: Never Used  Substance Use Topics   Alcohol use: Yes    Comment: rarely   Drug use: No   "

## 2024-06-03 ENCOUNTER — Encounter (HOSPITAL_BASED_OUTPATIENT_CLINIC_OR_DEPARTMENT_OTHER): Payer: Self-pay | Admitting: Physical Therapy

## 2024-06-03 ENCOUNTER — Ambulatory Visit (HOSPITAL_BASED_OUTPATIENT_CLINIC_OR_DEPARTMENT_OTHER): Admitting: Physical Therapy

## 2024-06-03 ENCOUNTER — Other Ambulatory Visit: Payer: Self-pay

## 2024-06-03 ENCOUNTER — Ambulatory Visit: Admitting: Rheumatology

## 2024-06-03 ENCOUNTER — Encounter: Payer: Self-pay | Admitting: Rheumatology

## 2024-06-03 VITALS — BP 140/85 | HR 79 | Temp 97.6°F | Resp 14 | Ht 65.5 in | Wt 231.8 lb

## 2024-06-03 DIAGNOSIS — M25511 Pain in right shoulder: Secondary | ICD-10-CM | POA: Diagnosis not present

## 2024-06-03 DIAGNOSIS — M19042 Primary osteoarthritis, left hand: Secondary | ICD-10-CM

## 2024-06-03 DIAGNOSIS — R600 Localized edema: Secondary | ICD-10-CM

## 2024-06-03 DIAGNOSIS — M19072 Primary osteoarthritis, left ankle and foot: Secondary | ICD-10-CM

## 2024-06-03 DIAGNOSIS — M19071 Primary osteoarthritis, right ankle and foot: Secondary | ICD-10-CM

## 2024-06-03 DIAGNOSIS — M5030 Other cervical disc degeneration, unspecified cervical region: Secondary | ICD-10-CM

## 2024-06-03 DIAGNOSIS — Z8781 Personal history of (healed) traumatic fracture: Secondary | ICD-10-CM

## 2024-06-03 DIAGNOSIS — M6281 Muscle weakness (generalized): Secondary | ICD-10-CM

## 2024-06-03 DIAGNOSIS — M19041 Primary osteoarthritis, right hand: Secondary | ICD-10-CM | POA: Diagnosis not present

## 2024-06-03 DIAGNOSIS — M170 Bilateral primary osteoarthritis of knee: Secondary | ICD-10-CM | POA: Diagnosis not present

## 2024-06-03 DIAGNOSIS — M8589 Other specified disorders of bone density and structure, multiple sites: Secondary | ICD-10-CM | POA: Diagnosis not present

## 2024-06-03 DIAGNOSIS — G8929 Other chronic pain: Secondary | ICD-10-CM

## 2024-06-03 DIAGNOSIS — M51369 Other intervertebral disc degeneration, lumbar region without mention of lumbar back pain or lower extremity pain: Secondary | ICD-10-CM | POA: Diagnosis not present

## 2024-06-03 DIAGNOSIS — M25571 Pain in right ankle and joints of right foot: Secondary | ICD-10-CM

## 2024-06-03 DIAGNOSIS — Z8679 Personal history of other diseases of the circulatory system: Secondary | ICD-10-CM | POA: Diagnosis not present

## 2024-06-03 DIAGNOSIS — I83813 Varicose veins of bilateral lower extremities with pain: Secondary | ICD-10-CM

## 2024-06-03 DIAGNOSIS — M1A09X0 Idiopathic chronic gout, multiple sites, without tophus (tophi): Secondary | ICD-10-CM

## 2024-06-03 DIAGNOSIS — Z5181 Encounter for therapeutic drug level monitoring: Secondary | ICD-10-CM

## 2024-06-03 DIAGNOSIS — Z8639 Personal history of other endocrine, nutritional and metabolic disease: Secondary | ICD-10-CM

## 2024-06-03 DIAGNOSIS — M5459 Other low back pain: Secondary | ICD-10-CM

## 2024-06-03 DIAGNOSIS — M25512 Pain in left shoulder: Secondary | ICD-10-CM

## 2024-06-03 NOTE — Therapy (Unsigned)
 " OUTPATIENT PHYSICAL THERAPY THORACOLUMBAR EVALUATION   Patient Name: Robin Odom MRN: [id number] DOB:1950/06/05, 74 y.o., female Today's Date: 06/04/2024  END OF SESSION:  PT End of Session - 06/03/24 1806     Visit Number 1    Date for Recertification  07/18/24    Authorization Type aetna Mcr    Progress Note Due on Visit 10    PT Start Time 1620    PT Stop Time 1705    PT Time Calculation (min) 45 min    Activity Tolerance Patient tolerated treatment well    Behavior During Therapy WFL for tasks assessed/performed          Past Medical History:  Diagnosis Date   Allergy 1971   penicillium   Arthritis    Carpal tunnel syndrome    left, per patient   Diabetes mellitus without complication (HCC)    Gout    Hyperlipidemia 05/19/2022   Hypertension    Partial retinal tear of both eyes without detachment 08/29/2017   Sleep apnea 1995   Past Surgical History:  Procedure Laterality Date   ABDOMINAL HYSTERECTOMY  1991   CARPAL TUNNEL RELEASE Right 2019   COLONOSCOPY     5 between 1994-2010   Left knee surgery  2006   ROTATOR CUFF REPAIR Left 2001   ROTATOR CUFF REPAIR Right 2003   SHOULDER SURGERY  2001/2003   TONSILECTOMY/ADENOIDECTOMY WITH MYRINGOTOMY  1971   Patient Active Problem List   Diagnosis Date Noted   Cervical radiculopathy 02/26/2024   Insomnia 02/25/2024   Paresthesia of lower extremity 02/25/2024   Right sided sciatica 02/24/2024   Tributary (branch) retinal vein occlusion, left eye, stable (HCC) 02/21/2024   Peripheral edema 10/27/2023   Polyarthralgia 10/27/2023   Menopausal symptom 10/16/2023   Uterine leiomyoma 10/16/2023   Class 2 severe obesity due to excess calories with serious comorbidity and body mass index (BMI) of 38.0 to 38.9 in adult 06/21/2023   Arthritis of ankle or foot, degenerative, left 06/13/2023   Bilateral chronic knee pain 02/28/2023   Encounter for general adult medical examination w/o abnormal findings 02/20/2023    Subclinical hyperthyroidism 02/20/2023   Tibialis posterior tendonitis, right 01/26/2023   Tibialis posterior tendon tear, nontraumatic, right 12/27/2022   Pure hypercholesterolemia 10/26/2022   Skin lesion of right leg 10/26/2022   OSA on CPAP 10/25/2022   Class 2 severe obesity due to excess calories with serious comorbidity and body mass index (BMI) of 37.0 to 37.9 in adult 08/03/2022   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 08/03/2022   Ganglion cyst of both hands 03/13/2022   Toe injury, left, initial encounter 06/27/2021   Bilateral bunions 11/02/2020   History of wheezing 03/18/2020   Osteopenia of multiple sites 10/30/2019   Bunion, right foot 08/13/2018   Prediabetes 05/14/2018   Essential hypertension, benign 05/14/2018   Morbid obesity due to excess calories (HCC) 05/14/2018   Contracture of right Achilles tendon 02/08/2017   Plantar fasciitis of right foot 02/08/2017   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 tophus 07/20/2016   Primary osteoarthritis of both hands 07/20/2016   Primary osteoarthritis of both knees 07/20/2016   Primary osteoarthritis of both feet 07/20/2016   DJD (degenerative joint disease), cervical 07/20/2016   Elevated CK 07/20/2016   History of humerus fracture 07/20/2016   History of hypertension 07/20/2016   History of diabetes mellitus 07/20/2016   History of hyperlipidemia 07/20/2016   Gastroesophageal reflux 12/08/2015   Chronic rhinitis 10/20/2015   Mild  intermittent asthma 10/20/2015   Metatarsalgia of both feet 08/31/2015   Pes planus of both feet 08/31/2015   Abnormal glucose 10/05/2012   Arthropathy 10/05/2012   Gout 10/05/2012   Carpal tunnel syndrome 10/12/2009    PCP: Catheryn Slocumb MD  REFERRING PROVIDER: Donaciano Sprang MD  REFERRING DIAG: M54.50 (ICD-10-CM) - Low back pain, unspecified   Rationale for Evaluation and Treatment: Rehabilitation  THERAPY DIAG:  Other low back pain  Chronic pain of both knees  Pain in  right ankle and joints of right foot  Muscle weakness (generalized)  ONSET DATE: 1 yr  SUBJECTIVE:                                                                                                                                                                                           SUBJECTIVE STATEMENT: I had been exercising at [facility] doing water aerobics 3 x week up until Feb of last year.  Stopped because I was having OA pain all over; foot and ankle pain, LBP. OA in fingers.  Knee injections for knee pain x 3 this past year. I now think we have it under control. Working with trainer 2 x week using weights band. Work both my arms and legs  PERTINENT HISTORY:  Polyarthralgia; OA ankle/feet bilaterally; oa knees, hands; gout  PAIN:  Are you having pain? Yes: NPRS scale: current 0/10, worst 5/10 Pain location: Lt knee, R foot/ankle Pain description: ache Aggravating factors: less supportive shoes Relieving factors: sitting, meloxicam ; ice  PRECAUTIONS: None  RED FLAGS: None   WEIGHT BEARING RESTRICTIONS: No  FALLS:  Has patient fallen in last 6 months? No  LIVING ENVIRONMENT: Lives with by herself  OCCUPATION: retired; microbiologist  PLOF: Independent  PATIENT GOALS: return to water exercise. Find ways to stretch LB and reduce pain  NEXT MD VISIT: after a few aquatic session   OBJECTIVE:  Note: Objective measures were completed at Evaluation unless otherwise noted.  DIAGNOSTIC FINDINGS:  IMPRESSION: 2021 MRI Lumbar spondylosis as outlined and most notably as follows.   At L3-L4, there is multifactorial severe spinal canal stenosis with left greater than right subarticular narrowing. Moderate bilateral neural foraminal narrowing.   At L4-L5, there is multifactorial bilateral subarticular stenosis (moderate right, severe left) with encroachment upon the descending left L5 nerve root. Mild central canal stenosis. Moderate/severe left neural foraminal  narrowing.   No more than mild spinal canal stenosis at the remaining levels. Additional sites of mild and mild/moderate neural foraminal narrowing as described.  Pt reports OA bilateral ankle and feet R>L  PATIENT SURVEYS:  ODI: 7/50=14%  Edema: right ankle :due  to bursitis  COGNITION: Overall cognitive status: Within functional limits for tasks assessed      MUSCLE LENGTH: Hamstrings: WFL   POSTURE: No Significant postural limitations  PALPATION: Crepitus bilat knee  LUMBAR ROM:   Full  LOWER EXTREMITY ROM:     wfl  LOWER EXTREMITY MMT:    MMT Right eval Left eval  Hip flexion 4- 4-  Hip extension    Hip abduction 5 5  Hip adduction 5 5  Hip internal rotation    Hip external rotation    Knee flexion 4 4  Knee extension 4 4  Ankle dorsiflexion    Ankle plantarflexion    Ankle inversion    Ankle eversion     (Blank rows = not tested)  LUMBAR SPECIAL TESTS:  Slump test: Negative  FUNCTIONAL TESTS:  Timed up and go (TUG): 18.76 4 stage balance Passed 1-3.  SLS unable STS heavy ue support  GAIT: WFL Comments: slight knee valgus  TREATMENT  Eval Self care:Posture and body mechanic instruction; exercise frequency. STS transfers, car transfers                                                                                                                                 PATIENT EDUCATION:  Education details: Discussed eval findings, rehab rationale, aquatic program progression/POC and pools in area. Patient is in agreement  Person educated: Patient Education method: Explanation Education comprehension: verbalized understanding  HOME EXERCISE PROGRAM: Aquatic TBA.  Pt indep with HEP using personal trainer 2 x week  ASSESSMENT:  CLINICAL IMPRESSION: Patient is a 74 y.o. f who was seen today for physical therapy evaluation and treatment for LBP.  Pt is an active senior with goals to return to water aerobics at the [facility].  She presents with  pain limited deficits in LB knees and ankle/feet due to OA affecting strength, ROM, endurance, activity tolerance, gait, balance, and functional mobility with ADL's. Patient is having to modify and restrict ADL's as indicated by outcome measure score as well as subjective information and objective measures which is affecting overall participation. She participates 2 x week with a trainer and has for the past year. She will benefit from an episode of aquatic PT to introduce her to using water as a medium to exercise in order to improve function and reduce impairment.     OBJECTIVE IMPAIRMENTS: Abnormal gait, decreased activity tolerance, decreased balance, decreased mobility, difficulty walking, decreased strength, and pain.   ACTIVITY LIMITATIONS: carrying, lifting, bending, standing, squatting, stairs, transfers, and locomotion level  PARTICIPATION LIMITATIONS: cleaning, shopping, community activity, and exercise regimen  PERSONAL FACTORS: Time since onset of injury/illness/exacerbation and 1-2 comorbidities: see PmHx are also affecting patient's functional outcome.   REHAB POTENTIAL: Good  CLINICAL DECISION MAKING: Evolving/moderate complexity  EVALUATION COMPLEXITY: Moderate   GOALS: Goals reviewed with patient? Yes  SHORT TERM GOALS: Target date: 06/28/24  Pt will tolerate full aquatic sessions consistently without  increase in pain and with improving function to demonstrate good toleration and effectiveness of intervention.  Baseline: Goal status: INITIAL  2.  Pt will trial return to her [employer] completing a water aerobic class and report back toleration. Baseline:  Goal status: INITIAL  3.  Pt will engage in aquatic PT without limitation to pain. Baseline:  Goal status: INITIAL     LONG TERM GOALS: Target date: 07/18/24  Pt to improve on ODI by 10% to demonstrate statistically significant Improvement in function. (MCID 13-15%) Baseline: 7/50=14% Goal status: INITIAL  2.   Pt will improve on Tug test to <or= 14s to demonstrate improvement in lower extremity function, mobility and decreased fall risk. (13.5 community dwelling adults) Baseline: 18.76 Goal status: INITIAL  3.  Pt will be indep with final aquatic HEP and report return to consist water exercises weekly for continued management of condition and to reach a personal goal. Baseline:  Goal status: INITIAL  4.  Pt will report decrease in pain by at least 50% for improved toleration to activity/quality of life and to demonstrate improved management of pain. Baseline:  Goal status: INITIAL  5.  Pt will improve strength in hip flex by 1 grade to demonstrate improved overall physical function Baseline:  Goal status: INITIAL    PLAN:  PT FREQUENCY: 1-2x/week  PT DURATION: 6 weeks  PLANNED INTERVENTIONS: 97164- PT Re-evaluation, 97750- Physical Performance Testing, 97110-Therapeutic exercises, 97530- Therapeutic activity, 97112- Neuromuscular re-education, 97535- Self Care, [id number]- Manual therapy, [id number]- Gait training, [phone]- Aquatic Therapy, [phone]- Electrical stimulation (unattended), [phone]- Electrical stimulation (manual), [id number]- Ionotophoresis 4mg /ml Dexamethasone, 79439 (1-2 muscles), 20561 (3+ muscles)- Dry Needling, Patient/Family education, Balance training, Stair training, Taping, Joint mobilization, DME instructions, Cryotherapy, and Moist heat.  PLAN FOR NEXT SESSION: aquatic only: strengthening, stretching, proprioceptive and balance retraining, activity toleration; hips through feet/LB   Ronal Kem) Shataya Winkles MPT 06/04/24 11:04 AM [facility] [address] [place], [place], [address] Phone: [phone]   Fax:  [phone]   "

## 2024-06-03 NOTE — Patient Instructions (Signed)
 Please get following labs with Dr. Jarold:  CBC with differential, CMP with GFR, uric acid

## 2024-06-05 ENCOUNTER — Telehealth (HOSPITAL_BASED_OUTPATIENT_CLINIC_OR_DEPARTMENT_OTHER): Payer: Self-pay | Admitting: Physical Therapy

## 2024-06-05 ENCOUNTER — Ambulatory Visit (HOSPITAL_BASED_OUTPATIENT_CLINIC_OR_DEPARTMENT_OTHER): Payer: Self-pay | Admitting: Physical Therapy

## 2024-06-05 NOTE — Telephone Encounter (Signed)
 Patient did not show for aquatic therapy PT appointment.  Attempted to call patient at [phone], (the number listed in her chart), but error message was received.   Unable to discuss missed appointment, nor able to confirm next upcoming appointment.   Per attendance policy: If patient has 2 occasions of late cancellation (less than 24 hr notice) or no-show, patient will be allowed to schedule one appointment at a time.  After the 3rd incident of late cancellation or no-show, patient will be discharged and require a new referral from physician to resume treatment.   Delon Aquas, PTA 06/05/24 2:24 PM [facility] [address] [place], [place], [address] Phone: [phone]   Fax:  [phone]

## 2024-06-26 ENCOUNTER — Ambulatory Visit: Payer: Self-pay | Admitting: Internal Medicine

## 2024-07-01 ENCOUNTER — Ambulatory Visit (HOSPITAL_BASED_OUTPATIENT_CLINIC_OR_DEPARTMENT_OTHER): Payer: Self-pay | Admitting: Physical Therapy

## 2024-07-03 ENCOUNTER — Ambulatory Visit (HOSPITAL_BASED_OUTPATIENT_CLINIC_OR_DEPARTMENT_OTHER): Payer: Self-pay | Admitting: Physical Therapy

## 2024-07-08 ENCOUNTER — Ambulatory Visit (HOSPITAL_BASED_OUTPATIENT_CLINIC_OR_DEPARTMENT_OTHER): Payer: Self-pay | Admitting: Physical Therapy

## 2024-07-11 ENCOUNTER — Ambulatory Visit (HOSPITAL_BASED_OUTPATIENT_CLINIC_OR_DEPARTMENT_OTHER): Payer: Self-pay | Admitting: Physical Therapy

## 2024-12-02 ENCOUNTER — Ambulatory Visit: Admitting: Rheumatology

## 2024-12-09 ENCOUNTER — Ambulatory Visit: Admitting: Adult Health
